# Patient Record
Sex: Male | Born: 1935 | Race: White | Hispanic: No | Marital: Married | Smoking: Former smoker
Health system: Southern US, Community
[De-identification: ages and names within clinical notes are randomized; demographics above are authoritative.]

## PROBLEM LIST (undated history)

## (undated) DIAGNOSIS — H9190 Unspecified hearing loss, unspecified ear: Secondary | ICD-10-CM

## (undated) DIAGNOSIS — J449 Chronic obstructive pulmonary disease, unspecified: Secondary | ICD-10-CM

## (undated) DIAGNOSIS — I10 Essential (primary) hypertension: Secondary | ICD-10-CM

## (undated) DIAGNOSIS — I639 Cerebral infarction, unspecified: Secondary | ICD-10-CM

## (undated) DIAGNOSIS — E119 Type 2 diabetes mellitus without complications: Secondary | ICD-10-CM

## (undated) DIAGNOSIS — I4891 Unspecified atrial fibrillation: Secondary | ICD-10-CM

## (undated) HISTORY — PX: HERNIA REPAIR: SHX51

---

## 1998-12-31 ENCOUNTER — Encounter: Payer: Self-pay | Admitting: Emergency Medicine

## 1998-12-31 ENCOUNTER — Inpatient Hospital Stay (HOSPITAL_COMMUNITY): Admission: EM | Admit: 1998-12-31 | Discharge: 1999-01-04 | Payer: Self-pay | Admitting: Emergency Medicine

## 2000-01-30 ENCOUNTER — Inpatient Hospital Stay (HOSPITAL_COMMUNITY): Admission: EM | Admit: 2000-01-30 | Discharge: 2000-02-02 | Payer: Self-pay | Admitting: Emergency Medicine

## 2000-01-30 ENCOUNTER — Encounter: Payer: Self-pay | Admitting: Emergency Medicine

## 2000-01-31 ENCOUNTER — Encounter: Payer: Self-pay | Admitting: Neurology

## 2000-02-03 ENCOUNTER — Emergency Department (HOSPITAL_COMMUNITY): Admission: EM | Admit: 2000-02-03 | Discharge: 2000-02-04 | Payer: Self-pay | Admitting: Emergency Medicine

## 2000-02-09 ENCOUNTER — Encounter: Admission: RE | Admit: 2000-02-09 | Discharge: 2000-02-28 | Payer: Self-pay | Admitting: Neurology

## 2002-05-22 ENCOUNTER — Emergency Department (HOSPITAL_COMMUNITY): Admission: EM | Admit: 2002-05-22 | Discharge: 2002-05-22 | Payer: Self-pay | Admitting: Emergency Medicine

## 2006-04-05 ENCOUNTER — Observation Stay (HOSPITAL_COMMUNITY): Admission: EM | Admit: 2006-04-05 | Discharge: 2006-04-07 | Payer: Self-pay | Admitting: Emergency Medicine

## 2007-03-06 ENCOUNTER — Observation Stay (HOSPITAL_COMMUNITY): Admission: EM | Admit: 2007-03-06 | Discharge: 2007-03-07 | Payer: Self-pay | Admitting: Emergency Medicine

## 2008-10-01 ENCOUNTER — Inpatient Hospital Stay (HOSPITAL_COMMUNITY): Admission: EM | Admit: 2008-10-01 | Discharge: 2008-10-04 | Payer: Self-pay | Admitting: Emergency Medicine

## 2010-07-03 LAB — BASIC METABOLIC PANEL
CO2: 22 mEq/L (ref 19–32)
CO2: 25 mEq/L (ref 19–32)
Calcium: 8.1 mg/dL — ABNORMAL LOW (ref 8.4–10.5)
Calcium: 8.9 mg/dL (ref 8.4–10.5)
Chloride: 108 mEq/L (ref 96–112)
Chloride: 109 mEq/L (ref 96–112)
GFR calc Af Amer: >60 mL/min (ref >60)
GFR calc Af Amer: >60 mL/min (ref >60)
GFR calc Af Amer: >60 mL/min (ref >60)
GFR calc non Af Amer: >60 mL/min (ref >60)
Glucose, Bld: 109 mg/dL — ABNORMAL HIGH (ref 70–99)
Potassium: 3.7 mEq/L (ref 3.5–5.1)
Sodium: 137 mEq/L (ref 135–145)
Sodium: 137 mEq/L (ref 135–145)
Sodium: 138 mEq/L (ref 135–145)

## 2010-07-03 LAB — CBC
Hemoglobin: 12.3 g/dL — ABNORMAL LOW (ref 13.0–17.0)
Hemoglobin: 12.6 g/dL — ABNORMAL LOW (ref 13.0–17.0)
Hemoglobin: 14.7 g/dL (ref 13.0–17.0)
MCHC: 32.9 g/dL (ref 30.0–36.0)
MCHC: 33 g/dL (ref 30.0–36.0)
MCV: 89.3 fL (ref 78.0–100.0)
MCV: 89.9 fL (ref 78.0–100.0)
RBC: 4.15 MIL/uL — ABNORMAL LOW (ref 4.22–5.81)
RBC: 4.25 MIL/uL (ref 4.22–5.81)
RBC: 4.98 MIL/uL (ref 4.22–5.81)
RDW: 14.3 % (ref 11.5–15.5)
WBC: 4.2 10*3/uL (ref 4.0–10.5)
WBC: 6.1 10*3/uL (ref 4.0–10.5)

## 2010-07-03 LAB — CULTURE, BLOOD (ROUTINE X 2)

## 2010-07-03 LAB — CLOSTRIDIUM DIFFICILE EIA

## 2010-07-03 LAB — GLUCOSE, CAPILLARY
Glucose-Capillary: 100 mg/dL — ABNORMAL HIGH (ref 70–99)
Glucose-Capillary: 117 mg/dL — ABNORMAL HIGH (ref 70–99)
Glucose-Capillary: 117 mg/dL — ABNORMAL HIGH (ref 70–99)
Glucose-Capillary: 118 mg/dL — ABNORMAL HIGH (ref 70–99)
Glucose-Capillary: 127 mg/dL — ABNORMAL HIGH (ref 70–99)
Glucose-Capillary: 129 mg/dL — ABNORMAL HIGH (ref 70–99)

## 2010-07-03 LAB — URINE MICROSCOPIC-ADD ON

## 2010-07-03 LAB — URINALYSIS, ROUTINE W REFLEX MICROSCOPIC
Leukocytes, UA: NEGATIVE
Nitrite: NEGATIVE
Specific Gravity, Urine: 1.022 (ref 1.005–1.030)
pH: 5.5 (ref 5.0–8.0)

## 2010-07-03 LAB — DIFFERENTIAL
Basophils Relative: 0 % (ref 0–1)
Lymphs Abs: 0.8 10*3/uL (ref 0.7–4.0)
Monocytes Absolute: 0.3 10*3/uL (ref 0.1–1.0)
Monocytes Relative: 3 % (ref 3–12)
Neutro Abs: 9.1 10*3/uL — ABNORMAL HIGH (ref 1.7–7.7)

## 2010-07-03 LAB — POCT CARDIAC MARKERS
CKMB, poc: <1 ng/mL — ABNORMAL LOW (ref 1.0–8.0)
Myoglobin, poc: 106 ng/mL (ref 12–200)
Troponin i, poc: <0.05 ng/mL (ref 0.00–0.09)

## 2010-07-03 LAB — STOOL CULTURE

## 2010-07-03 LAB — HEMOGLOBIN A1C: Hgb A1c MFr Bld: 6.3 % — ABNORMAL HIGH (ref 4.6–6.1)

## 2010-07-03 LAB — TSH: TSH: 1.043 u[IU]/mL (ref 0.350–4.500)
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
## 2010-08-09 NOTE — H&P (Signed)
[name], [name]              ACCOUNT NO.:  [id number]   MEDICAL RECORD NO.:  [id number]          PATIENT TYPE:  INP   LOCATION:  1532                         FACILITY:  [facility]   PHYSICIAN:  Pedro Earls, MD     DATE OF BIRTH:  1927/08/25   DATE OF ADMISSION:  09/30/2008  DATE OF DISCHARGE:                              HISTORY & PHYSICAL   PRIMARY CARE PHYSICIAN:  [facility].   CHIEF COMPLAINT:  Fever and weakness and shaking spells.   HISTORY OF PRESENT ILLNESS:  This is a 77 year old white male patient  with a past medical history of CVA leading to aphasia and memory  impairment who was apparently doing fine until this evening when the  patient was noticed by the family members that he had been having some  shaking spells for almost an hour, and subsequently the patient had an  episode of urinary incontinence which was new for him.  According to  wife, the patient had been feeling weak for the past few days but  gradually had gotten worse and today was found to have difficulty  getting out of his chair and was having some shaking spells.  The  patient was brought over to the ED where he was found to be having some  fever.   REVIEW OF SYSTEMS:  The patient also had an episode of diarrhea, stated  a couple episodes of watery diarrhea today and yesterday with some  abdominal pain.  No nausea, no vomiting, no chest pain, no shortness of  breath.  All the review of systems are negative except what is mentioned  above.   PAST HISTORY:  1. Hypertension.  2. Diabetes.  3. Glaucoma.  4. Hyperlipidemia.  5. CA prostate.  6. CVA.   PAST SURGICAL HISTORY:  Hemorrhoidectomy, back surgery and prostate  biopsy.   SOCIAL HISTORY:  Negative for smoking, alcohol or IV drug abuse.  The  patient lives with wife.   ALLERGIES:  NKDA.   FAMILY HISTORY:  Positive for diabetes in mother.   MEDICATIONS:  1. Terazosin 5 mg every night.  2. Darvocet 100 mg b.i.d.  3. Finasteride 5 mg daily.  4. Travoprost Opthalmic 1 drop every night.  5. Loperamide 2 mg p.r.n.  6. Hydrochlorothiazide 12.5 mg daily.  7. Metoprolol 25 mg daily.  8. Metformin 500 mg b.i.d.  9. Pravastatin 40 mg every night.  10.Omeprazole 20 mg b.i.d.   PHYSICAL EXAMINATION:  VITALS:  Temperature initially was 103 -  subsequently later was 100.7, respiration 16-18, pulse 83-92, blood  pressure 104-134/50s-60s, pulse oximetry of 91%.  GENERAL:  The patient is awake, alert, oriented x3.  Does not appear to  be in acute distress.  HEENT:  Pupils equal, round, reactive to light.  No icterus.  Mild  pallor.  Extraocular movements are intact.  Mucosa is dry.  NECK:  Supple.  No JVD.  No lymphadenopathy.  CARDIOVASCULAR SYSTEM:  S1-S2 regular.  No murmurs, heaves or gallops.  CHEST:  Clear.  ABDOMEN:  Soft.  There is tenderness with deep palpation in the mid  midabdomen.  No rebound.  Bowel sounds present.  No hepatosplenomegaly.  EXTREMITIES:  Peripheral pulses present.  No clubbing, cyanosis or  edema.  CENTRAL NERVOUS SYSTEM:  Sensory and motor grossly intact.  Cranial  nerves II-XII  intact.  SKIN:  No rashes.  MUSCULOSKELETAL:  Intact range of motion is present.   The patient's EKG showed normal sinus rhythm with left axis deviation  and left anterior fascicular block as well as right bundle branch block  and inverted T-waves.  No acute ST-T wave changes were seen suggestive  for any ischemia.   Chest x-ray showed some atelectasis.  CT head showed remote infarction  of left posterior temporal and parietal lobe.  No acute findings were  seen.  Troponin less than 0.05.  Creatinine 1.16.  UA is negative for  nitrite, leukocyte esterase.  H and H is 14.7 and 44.41, white count is  10.2, platelet count is 158.   IMPRESSION:  1. Abdominal pain with diarrhea.  2. Gait abnormalities.  Rule out transient ischemic attack.  3. Urinary incontinence, acute.  4. Fever.  5. History of  CVA.  6. History of hypertension.  7. Hyperlipidemia.   PLAN:  Obtain CT abdomen and pelvis to rule out for diverticulitis.  Will check stool for clostridium difficile x3.  Hold metformin for now.  Start Flagyl empirically as well as Levaquin which can be stopped after  the CT scan has been done.  IV fluids at 80 mL per hour.  Aspirin will  be started 325 mg daily.      Pedro Earls, MD  Electronically Signed     NS/MEDQ  D:  09/30/2008  T:  10/01/2008  Job:  [id number]   cc:   [facility]  [place], [place]

## 2010-08-09 NOTE — Discharge Summary (Signed)
NAMEJACARRI, [name]              ACCOUNT NO.:  [id number]   MEDICAL RECORD NO.:  [id number]          PATIENT TYPE:  INP   LOCATION:  1532                         FACILITY:  [facility]   PHYSICIAN:  Renee Ramus, MD       DATE OF BIRTH:  02-15-1928   DATE OF ADMISSION:  09/30/2008  DATE OF DISCHARGE:  10/04/2008                               DISCHARGE SUMMARY   PRIMARY DISCHARGE DIAGNOSIS:  Viral gastroenteritis.   SECONDARY DIAGNOSES:  1. Hypertension.  2. Diabetes mellitus type 2 well-controlled.  3. Glaucoma.  4. Hyperlipidemia.  5. Cancer of the prostate.  6. Stroke history.   HOSPITAL COURSE:  1. Viral gastroenteritis.  The patient is a 77 year old male who      presented with mental status changes, dehydration, mild fevers and      diarrhea.  The patient was seen in the emergency department and was      admitted to our service.  The patient was placed on broad spectrum      antibiotics.  He had an abdominal CT scan that showed pan colitis.      He did not have an elevated white count.  He did not have fevers      while in-house.  He did not have evidence of bright red blood per      rectum or guaiac positive stools.  His C. diff cultures have been      negative.  We believe he was suffering from viral gastroenteritis.      The patient is now being discharged with instructions to follow up      with his primary care physician within 2 weeks if his symptoms      persist.  2. Diabetes mellitus type 2 well-controlled.  The patient will be      continued on his metformin postdischarge.  His hemoglobin A1c is      6.1.  3. Hypertension.  The patient has been relatively well-controlled on      his current regimen.  We are, however, discontinuing his      hydrochlorothiazide since he came in dehydrated and believe that      this was a contributing factor.  4. Glaucoma.  The patient will be continued on his alpha blockers and      this is stable.  5. Hyperlipidemia.  The patient  will continue statin therapy.  6. Stroke history.  The patient will be placed on aspirin 81 mg p.o.      daily.  7. Cancer of the prostate.  The patient will continue his outpatient      medication regimen.  He has not required additional treatment for      this.   LABS:  1. No evidence of leukocytosis.  The patient did have a mild anemia      with hemoglobin of 12.3, hematocrit 37 and a mild thrombocytopenia      with platelets of 138.  2. Blood glucose relatively stable ranging between 114-125.  3. Initial elevated BUN and creatinine with BUN 19, creatinine 1.16;      this  is decreased to a BUN of 9 and creatinine of 0.97.  4. UA showing moderate amount of blood but no evidence of infection,      somewhat concentrated with specific gravity of 1.022 upon      admission.  5. Negative blood cultures x2, negative C. diff toxin assay x1.  Stool      culture is currently pending.   STUDIES:  1. EKG showing left axis deviation with right bundle branch block and      a possibility of old inferior wall infarct.  2. CT of the abdomen and pelvis showing enterocolitis spanning from      the ileum to the colon with tiny liver lesions thought to be simple      cysts and an elongated spleen.  He also has evidence of enlarged      prostate.  3. CT head showing old lacunar infarct in the right posterior temporal      and parietal lobes.  No evidence of acute findings.  4. Chest x-ray showing cardiomegaly with mild bibasilar atelectasis.   DISCHARGE MEDICATIONS:  1. Terazosin 5 mg p.o. daily.  2. Docusate sodium 100 mg p.o. b.i.d. which we are asking him to      discontinue.  3. Finasteride 5 mg p.o. daily.  4. Travoprost 0.004% one drop both eyes daily.  5. Loperamide 2 mg p.o. p.r.n. which we are asking him to discontinue.  6. Hydrochlorothiazide 12.5 mg p.o. daily which we are asking him to      discontinue.  7. Metoprolol 25 mg p.o. daily.  8. Metformin 500 mg p.o. b.i.d.  9.  Pravastatin 40 mg p.o. daily.  0.  Omeprazole 20 mg p.o. b.i.d.   There are no other labs or studies pending at time of discharge.  The  patient is in stable condition and anxious for discharge.  Time spent 35  minutes.      Renee Ramus, MD  Electronically Signed     JF/MEDQ  D:  10/04/2008  T:  10/04/2008  Job:  [id number]   cc:   [facility]

## 2010-08-09 NOTE — Consult Note (Signed)
[name], [name]              ACCOUNT NO.:  [id number]   MEDICAL RECORD NO.:  [id number]          PATIENT TYPE:  INP   LOCATION:  4735                         FACILITY:  [facility]   PHYSICIAN:  Vesta Mixer, M.D. DATE OF BIRTH:  12/05/1927   DATE OF CONSULTATION:  03/06/2007  DATE OF DISCHARGE:                                 CONSULTATION   [name] is a 77 year old gentleman with a history of hypertension and  a history of stroke.  He is admitted to the hospital for episodes of  chest pain.   The patient is a very poor historian secondary to his stroke.  He does  not recall a lot of the details of his presenting symptoms.   The patient has had episodes of chest pain intermittently.  He thinks he  may have had some cardiology workup at the [facility] in the past but  does not recall.  He thinks he may have had a stress test.  He developed  chest pain last night which was described as a fullness and a bubble-  like sensation in his chest.  He described it as a fullness.  There was  no radiation.  There was no diaphoresis or shortness of breath.  He  presented to the [facility] emergency room where the pressure was  relieved with sublingual nitroglycerin.  He denies having any episodes  of indigestion.  The pain was not associated with eating, drinking,  changes of position, taking a deep breath or exercise.   The pain has not recurred throughout the hospitalization.  The patient  still feels a little bit of some sort of discomfort there but he is no  longer having any of the pain.   CURRENT MEDICATIONS:  The patient does not remember his medications.  In  the hospital he has been put on Lopressor 25 mg p.o. b.i.d.   ALLERGIES:  No known drug allergies.   PAST MEDICAL HISTORY:  1. History of stroke.  2. Hypertension.  3. History of prostate cancer according to our old records.   SOCIAL HISTORY:  The patient quit smoking 5 years ago.  He does not  drink alcohol.   FAMILY HISTORY:  Negative except for as noted in the HPI.   EXAMINATION:  He is an elderly gentleman in no acute distress.  He is  alert and oriented x3 and his mood and affect are normal.  His  temperature is 98.6, his heart rate 61, blood pressure is 160/70.  HEENT EXAM:  Reveals 2+ carotids.  He has no bruits, no JVD, no  thyromegaly.  LUNGS:  Clear to auscultation.  HEART:  Regular rate, S1, S2.  ABDOMINAL EXAM:  Reveals good bowel sounds and is nontender.  EXTREMITIES:  He has no clubbing, cyanosis or edema.  NEUROLOGIC EXAM:  Nonfocal.   His EKG reveals normal sinus rhythm.  He has a right bundle-branch block  and no ST or T-wave changes.   His cardiac enzymes are negative x3 sets.   The patient appears to be very stable.  I doubt that this represents an  acute  coronary syndrome.  He does have some dyslipidemia.  His HDL was  found to be 20.  His triglycerides are 159.  His LDL is 103.   If he remains stable through the day then I would think he would be safe  to be discharged tomorrow.  We will perform a stress Cardiolite study as  an outpatient.  We will ambulate him in the halls today and assuming  that he stays stable, will discharge him tomorrow.   Hypertension.  His blood pressure is mildly elevated today.  It is quite  likely that he is on some additional medications that we do not have.  I  have asked his wife to get the name of his other medications.  When he  was here in January 2007 he was on terazosin 2 mg q.h.s. and  hydrochlorothiazide 25 mg a day.  We will start with those and see if  that helps his blood pressure.   All of his other medical problems remain stable.           ______________________________  Vesta Mixer, M.D.     PJN/MEDQ  D:  03/06/2007  T:  03/06/2007  Job:  [id number]   cc:   [name]  Wilson Singer, M.D.

## 2010-08-12 NOTE — H&P (Signed)
[facility]  Patient:    [name], [name]                     MRN: [id number] Adm. Date:  [id number] Attending:  Erich Montane                         History and Physical  CHIEF COMPLAINT: This is the first [facility] admission for this 77 year old right handed white married male from [place], [place] admitted from the emergency room for evaluation of confusion.  HISTORY OF PRESENT ILLNESS:  In January of 2001, this patient fell off of a ladder in [place], [place] striking his head. He had a black eye but it is not clear whether or not he had true loss of consciousness. He was admitted to a hospital in [place] for 4-5 days according to his wife and the family was told that he might have seizures afterwards, otherwise he has been carrying on his normal daily activities and was in good health this morning. 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 About 12 oclock noon time "something happened". His son was called who found him to be confused and he was brought to the [facility] Emergency Room. There were no signs of trauma, urinary incontinence or tongue biting. He remained confused throughout the afternoon and is admitted for further evaluation. The patient has no recollections of exactly what happened to him. He just says that something "went". He denies any episodes of single eye vision loss, double vision, hiccups, swelling problems, slurred speech, etc. He does not take aspirin. He has no known history of high blood pressure or diabetes mellitus, or heart disease. He quit smoking cigarettes one year ago. He does not take drugs or use alcohol.  PAST MEDICAL HISTORY:  Significant for head trauma in January of 2001, intestinal problems which were evaluated at [facility] in October of 2000 by the [facility]. Education was through the ninth grade. 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 He is married for the second times and has a total of 7 children, 6 sons and 1 daughter all of whom are in good health. He quit drinking alcohol 20 years ago, he quit smoking 1 year ago. He has had no operations. He has had no serious injuries. He is allergic to an unknown type of antibiotic. He doesnt take any medications.  FAMILY HISTORY:  His mother died in her 56s from diabetes mellitus. His father died in 93s of unknown causes. He has a brother 14, 18, one in his 12s and one in his 46s all of whom are in good health. His brother died at 52 from cancer. He had another brother die in his 79s from a motor vehicle accident. He has 2 sisters 24 and 41 living well and one sister who died.  PHYSICAL EXAMINATION:  GENERAL:  Revealed a well-developed, white male who was confused. He had both left and right confusion and some short-term memory loss but did follow commands.  VITAL SIGNS:  Blood pressure lying in the right and left arm was 150/70, heart rate was 60. He had a left supraclavicular bruit heard. He was afebrile.  MENTAL STATUS:  He was alert and oriented to person though he had not been at times during the day. He did recognize his wife. He was not oriented to place, to year or  month. He could remember 1-3 objects out of 5 minutes. He knew the number of nickels in a quarter and in a dollar but not in a dollar and twenty-five. He could name objects. He had poor repetition. He had some left and right confusion. There were no ______. His cranial nerve examination revealed visual fields to be full. His disks were flat. The pupils reacted from 4-3 bilaterally. Cornuals were present. The facial sensation was equal. There was no VII nerve palsy. Hearing was decreased with air conduction greater than bone conduction. Tongue was midline. The uvula was midline. Gags were present. Sternocleidomastoid and trapezius testing were normal. His motor examination  revealed 5/5 strength in the upper and lower extremities. The sensory examination was intact to pinprick, touch, joint position and vibration testing. Deep tendon reflexes were 2+. There were no ankle jerks. Plantar responses were down going.  HEENT:  Examination revealed he upper false teeth but no lower false teeth in place. His tympanic membranes were clear.  LUNGS:  He had rales in the right posterior lung field but otherwise his lungs were clear.  HEART:  Revealed no murmurs.  ABDOMEN:  Bowel sounds were normal.  GENITALIA:  He was circumcised.  EXTREMITIES:  There was no cyanosis, clubbing or edema.  RECTAL:  Not performed since not pertinent to the present illness.  LABORATORY DATA:  Revealed a urine which was unremarkable. His hemoglobin was 15.0, hematocrit 42.3, white blood cell count 6300, platelet count was 186,000 with 61% polys, 27% lymphs, 5% monocytes, 3% eosinophils and 1% basophils. His pH was 7.398, pCO2 35.9 and a PO2 was 64.9. A 12 lead EKG showed normal sinus rhythm, left anterior fascicular block with an abnormal EKG. PR interval was 90 milliseconds. Chest x-ray showed no acute disease. CT scan of the brain showed mild diffuse central and cortical atrophy. Sodium was 139, potassium 4.0, chloride 109, CO2 content 23, glucose 92, BUN 12, creatinine 1.0, calcium 9.4. Total protein 6.0, albumin 4.1, SGOT 19, SGPT 23, alkaline phosphatase 43, total bilirubin 0.4.  IMPRESSION: 1. Confusional state, code 298.9. 2. Rule out ______, code 434.91. 3. History of head trauma with black eye, code 851.02. 4. Suspect chronic obstructive pulmonary disease, code 496.  PLAN:  Admit the patient for further evaluation to include MRI studies. DD:  01/30/00 TD:  01/31/00 Job: [id number]

## 2010-08-12 NOTE — Procedures (Signed)
[place]  Patient:    [name]                      MRN: [id number] Proc. Date: 01/01/99 Adm. Date:  [id number] Attending:  Rich Brave                           Procedure Report  PROCEDURE PERFORMED:  Colonoscopy with biopsies.  ENDOSCOPIST:  Florencia Reasons, M.D.  INDICATIONS:  A 77 year old with diarrhea and now rectal bleeding, suprapubic abdominal pain, and leukocytosis.  FINDINGS:  Pseudomembranous pancolitis.  DESCRIPTION OF PROCEDURE:  The nature, purpose and risks of the procedure had been discussed with the patient, who provided written consent.  He was brought from is hospital room to the endoscopy unit.  The procedure was performed unprepped. Sedation was fentanyl 50 mcg and Versed 6 mg IV without arrhythmias or desaturation.  Perianal exam disclosed prolapsed hemorrhoids that were partially forming into kin tags.  Digital exam was otherwise unremarkable.  The Olympus adult video colonoscope was advanced quite easily around the colon o the area just above the cecum.  Despite the absence of a prep, there was absolutely no stool within the colonic  lumen.  This exam was striking for the presence of pseudomembranous colitis, characterized by exudate in a circular fashion coating the majority of the colonic mucosa all the way to the limit of the exam which was felt to be one or two haustrations above the cecum.  Moderate attempts were made to advance the scope  further but this could not readily be accomplished, so pullback was initiated.  No polyps, cancer, diverticular disease or vascular malformations were observed  during this exam.  Admittedly, small lesions could be missed because of the large amount of exudate but no overt abnormalities other than the pseudomembranous colitis itself were seen.  Multiple biopsies were obtained and a stool aspirate was able to be obtained, totalling about 10 cc  of liquid brown stool, to send for Clostridium difficile analysis.  Of note, there was no blood within the colonic lumen despite the patients stated history of rectal bleeding.  The patient tolerated the procedure well.  There were no apparent complications. Retroflexion was not performed in the rectum.  IMPRESSION:  Pseudomembranous colitis.  PLAN: 1. Await pathology on biopsies and await results of Clostridium difficile toxin    assay. 2. Initiate treatment with metronidazole. DD:  01/01/99 TD:  01/03/99 Job: [id number]
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
## 2010-08-12 NOTE — Consult Note (Signed)
NAMEBRENDT, [name]              ACCOUNT NO.:  [id number]   MEDICAL RECORD NO.:  [id number]          PATIENT TYPE:  OBV   LOCATION:  1442                         FAC[facility]   PHYSICIAN:  Corinna L. Lendell Caprice, MDDATE OF BIRTH:  Jun 03, 1927   DATE OF CONSULTATION:  04/06/2005  DATE OF DISCHARGE:                                 CONSULTATION   REASON FOR CONSULTATION:  Hypertension and right bundle branch block.   IMPRESSION/RECOMMENDATIONS:  1. Hypertension.  I recommend resuming metoprolol and would hold      hydrochlorothiazide for around 7 days and follow up with primary      care physician.  The systolic blood pressure is slightly low, but      mean arterial blood pressure and systolic blood pressure is within      normal limits; plus the patient has no dizziness or other symptoms      of hypotension.  2. Right bundle branch block, left anterior fascicular block.  No      further workup is needed.  3. History of prostate cancer.  4. Glaucoma.   HISTORY OF PRESENT ILLNESS:  [name] is a pleasant unassigned 78-year-  old white male who was admitted to Dr. Carlos Levering service yesterday after  having undergone hand surgery for an open right second metacarpal  phalangeal dislocation and collateral ligament avulsion, an open third  metacarpal phalangeal joint dislocation.  The patient's primary care  physician is at the [facility] in [place].  We were consulted over concerns of  new right bundle branch block on EKG and also for diastolic blood  pressure of 40.  The patient currently has no complaints other than some  pain in his hand.   PAST MEDICAL HISTORY:  As above.   MEDICATIONS AT HOME:  1. Metoprolol 50 mg p.o. b.i.d.  2. Terazosin 2 mg p.o. q.h.s.  3. Hydrochlorothiazide 25 mg daily.  4. Timolol eye drops.  5. Baby aspirin daily.  6. In house, his metoprolol and hydrochlorothiazide have been held,      and he has been started on Ancef and some p.r.n. pain  medications.   SOCIAL HISTORY:  The patient quit smoking 7 years ago.  He does not  drink.  He denies a history of drug use.   FAMILY HISTORY:  Noncontributory.   REVIEW OF SYSTEMS:  As above, otherwise negative.   PHYSICAL EXAMINATION:  VITAL SIGNS:  His temperature is 98, pulse 63,  respiratory rate 20, blood pressure 112/49, oxygen saturation 98% on 2  liters nasal cannula.  GENERAL:  The patient is comfortable, well-nourished, well-developed in  no acute distress.  HEENT:  Normocephalic, atraumatic.  Pupils equal, round, reactive to  light.  NECK:  Supple.  LUNGS:  Clear to auscultation bilaterally without wheezes, rhonchi, or  rales.  CARDIOVASCULAR:  Regular rate and rhythm without murmurs,  gallops, or rubs.  ABDOMEN:  Normal bowel sounds, soft, nontender, nondistended.  GU/RECTAL:  Deferred.  EXTREMITIES:  No clubbing, cyanosis, or edema.  His right hand is  elevated and in a splint.  NEUROLOGIC:  Alert and oriented.  Cranial  nerves and sensorimotor exam  are intact.  PSYCHIATRIC:  Normal affect.   LABS:  BMET, coagulation panel, CBC, all within normal limits.  EKG  shows normal sinus rhythm with a left anterior fascicular block, which  apparently is old according to previous dictations.  There is no old EKG  on the chart.  He also has a right bundle branch block.  X-ray of the  right hand showed dorsal dislocations of the second and third MCP joints  without visible fracture.   I would like to thank Dr. Amanda Pea for this consultation.  We will be  available as needed.      Corinna L. Lendell Caprice, MD  Electronically Signed     CLS/MEDQ  D:  04/06/2006  T:  04/07/2006  Job:  [id number]

## 2011-01-02 LAB — LIPID PANEL
Cholesterol: 155
HDL: 20 — ABNORMAL LOW
Triglycerides: 159 — ABNORMAL HIGH

## 2011-01-02 LAB — CBC
HCT: 41.9
Platelets: 174
RDW: 14.2

## 2011-01-02 LAB — DIFFERENTIAL
Basophils Absolute: 0
Eosinophils Absolute: 0.1 — ABNORMAL LOW
Eosinophils Relative: 3
Lymphocytes Relative: 14

## 2011-01-02 LAB — CARDIAC PANEL(CRET KIN+CKTOT+MB+TROPI)
CK, MB: 1.6
Total CK: 65
Total CK: 73

## 2011-01-02 LAB — POCT CARDIAC MARKERS
CKMB, poc: 1
CKMB, poc: <1 — ABNORMAL LOW
Myoglobin, poc: 65.9
Troponin i, poc: <0.05

## 2011-01-02 LAB — TSH: TSH: 4.592

## 2011-01-02 LAB — I-STAT 8, (EC8 V) (CONVERTED LAB)
Bicarbonate: 23.5
Glucose, Bld: 183 — ABNORMAL HIGH
TCO2: 25
pCO2, Ven: 45.3
pH, Ven: 7.323 — ABNORMAL HIGH

## 2011-01-02 LAB — HEMOGLOBIN A1C
Hgb A1c MFr Bld: 6.6 — ABNORMAL HIGH
Mean Plasma Glucose: 158

## 2011-01-02 LAB — D-DIMER, QUANTITATIVE: D-Dimer, Quant: 0.46

## 2011-01-02 LAB — POCT I-STAT CREATININE: Operator id: 272551

## 2012-08-06 SURGERY — Surgical Case
Anesthesia: *Unknown

## 2013-03-25 ENCOUNTER — Emergency Department (HOSPITAL_BASED_OUTPATIENT_CLINIC_OR_DEPARTMENT_OTHER): Payer: Medicare Other

## 2013-03-25 ENCOUNTER — Inpatient Hospital Stay (HOSPITAL_BASED_OUTPATIENT_CLINIC_OR_DEPARTMENT_OTHER)
Admission: EM | Admit: 2013-03-25 | Discharge: 2013-03-29 | DRG: 189 | Disposition: A | Discharge status: Home / Self Care | Payer: Medicare Other | Attending: Internal Medicine | Admitting: Internal Medicine

## 2013-03-25 ENCOUNTER — Encounter (HOSPITAL_BASED_OUTPATIENT_CLINIC_OR_DEPARTMENT_OTHER): Payer: Self-pay | Admitting: Emergency Medicine

## 2013-03-25 DIAGNOSIS — J9600 Acute respiratory failure, unspecified whether with hypoxia or hypercapnia: Principal | ICD-10-CM | POA: Diagnosis present

## 2013-03-25 DIAGNOSIS — IMO0002 Reserved for concepts with insufficient information to code with codable children: Secondary | ICD-10-CM

## 2013-03-25 DIAGNOSIS — E039 Hypothyroidism, unspecified: Secondary | ICD-10-CM

## 2013-03-25 DIAGNOSIS — E785 Hyperlipidemia, unspecified: Secondary | ICD-10-CM | POA: Diagnosis present

## 2013-03-25 DIAGNOSIS — IMO0001 Reserved for inherently not codable concepts without codable children: Secondary | ICD-10-CM | POA: Diagnosis present

## 2013-03-25 DIAGNOSIS — E1165 Type 2 diabetes mellitus with hyperglycemia: Secondary | ICD-10-CM

## 2013-03-25 DIAGNOSIS — I2510 Atherosclerotic heart disease of native coronary artery without angina pectoris: Secondary | ICD-10-CM | POA: Diagnosis present

## 2013-03-25 DIAGNOSIS — J449 Chronic obstructive pulmonary disease, unspecified: Secondary | ICD-10-CM

## 2013-03-25 DIAGNOSIS — I452 Bifascicular block: Secondary | ICD-10-CM | POA: Diagnosis present

## 2013-03-25 DIAGNOSIS — Z8673 Personal history of transient ischemic attack (TIA), and cerebral infarction without residual deficits: Secondary | ICD-10-CM

## 2013-03-25 DIAGNOSIS — I4891 Unspecified atrial fibrillation: Secondary | ICD-10-CM | POA: Diagnosis present

## 2013-03-25 DIAGNOSIS — H9190 Unspecified hearing loss, unspecified ear: Secondary | ICD-10-CM | POA: Diagnosis present

## 2013-03-25 DIAGNOSIS — Z87891 Personal history of nicotine dependence: Secondary | ICD-10-CM

## 2013-03-25 DIAGNOSIS — N400 Enlarged prostate without lower urinary tract symptoms: Secondary | ICD-10-CM

## 2013-03-25 DIAGNOSIS — Z794 Long term (current) use of insulin: Secondary | ICD-10-CM

## 2013-03-25 DIAGNOSIS — I10 Essential (primary) hypertension: Secondary | ICD-10-CM

## 2013-03-25 DIAGNOSIS — J111 Influenza due to unidentified influenza virus with other respiratory manifestations: Secondary | ICD-10-CM | POA: Diagnosis present

## 2013-03-25 DIAGNOSIS — Z79899 Other long term (current) drug therapy: Secondary | ICD-10-CM

## 2013-03-25 DIAGNOSIS — J441 Chronic obstructive pulmonary disease with (acute) exacerbation: Secondary | ICD-10-CM | POA: Diagnosis present

## 2013-03-25 DIAGNOSIS — I200 Unstable angina: Secondary | ICD-10-CM

## 2013-03-25 HISTORY — DX: Cerebral infarction, unspecified: I63.9

## 2013-03-25 HISTORY — DX: Essential (primary) hypertension: I10

## 2013-03-25 HISTORY — DX: Unspecified hearing loss, unspecified ear: H91.90

## 2013-03-25 HISTORY — DX: Type 2 diabetes mellitus without complications: E11.9

## 2013-03-25 HISTORY — DX: Chronic obstructive pulmonary disease, unspecified: J44.9

## 2013-03-25 HISTORY — DX: Unspecified atrial fibrillation: I48.91

## 2013-03-25 LAB — BASIC METABOLIC PANEL
CO2: 21 mEq/L (ref 19–32)
GFR calc non Af Amer: 60 mL/min — ABNORMAL LOW (ref >90)
Glucose, Bld: 221 mg/dL — ABNORMAL HIGH (ref 70–99)
Potassium: 4.1 mEq/L (ref 3.7–5.3)
Sodium: 137 mEq/L (ref 137–147)

## 2013-03-25 LAB — CBC
HCT: 39.7 % (ref 39.0–52.0)
Platelets: 132 10*3/uL — ABNORMAL LOW (ref 150–400)
RBC: 4.49 MIL/uL (ref 4.22–5.81)
RDW: 13.8 % (ref 11.5–15.5)
WBC: 5.7 10*3/uL (ref 4.0–10.5)

## 2013-03-25 LAB — URINALYSIS, ROUTINE W REFLEX MICROSCOPIC
Leukocytes, UA: NEGATIVE
Nitrite: NEGATIVE
Specific Gravity, Urine: 1.022 (ref 1.005–1.030)
Urobilinogen, UA: 1 mg/dL (ref 0.0–1.0)

## 2013-03-25 LAB — CREATININE, SERUM
Creatinine, Ser: 1.01 mg/dL (ref 0.50–1.35)
GFR calc Af Amer: 77 mL/min — ABNORMAL LOW (ref >90)
GFR calc non Af Amer: 67 mL/min — ABNORMAL LOW (ref >90)

## 2013-03-25 LAB — TROPONIN I: Troponin I: <0.3 ng/mL (ref <0.30)

## 2013-03-25 LAB — INFLUENZA PANEL BY PCR (TYPE A & B)
Influenza A By PCR: NEGATIVE
Influenza B By PCR: NEGATIVE

## 2013-03-25 LAB — CBC WITH DIFFERENTIAL/PLATELET
Basophils Absolute: 0 10*3/uL (ref 0.0–0.1)
Lymphocytes Relative: 16 % (ref 12–46)
Lymphs Abs: 1.1 10*3/uL (ref 0.7–4.0)
Neutrophils Relative %: 75 % (ref 43–77)
Platelets: 137 10*3/uL — ABNORMAL LOW (ref 150–400)
RBC: 4.53 MIL/uL (ref 4.22–5.81)
RDW: 13.6 % (ref 11.5–15.5)
WBC: 6.8 10*3/uL (ref 4.0–10.5)

## 2013-03-25 LAB — URINE MICROSCOPIC-ADD ON

## 2013-03-25 LAB — GLUCOSE, CAPILLARY: Glucose-Capillary: 245 mg/dL — ABNORMAL HIGH (ref 70–99)

## 2013-03-25 MED ORDER — IPRATROPIUM-ALBUTEROL 0.5-2.5 (3) MG/3ML IN SOLN
3.0000 mL | Freq: Four times a day (QID) | RESPIRATORY_TRACT | Status: DC
  Administered 2013-03-25 (×3): 3 mL via RESPIRATORY_TRACT
  Filled 2013-03-25: qty 3

## 2013-03-25 MED ORDER — DEXTROSE 5 % IV SOLN
500.0000 mg | Freq: Once | INTRAVENOUS | Status: AC
  Administered 2013-03-25: 500 mg via INTRAVENOUS

## 2013-03-25 MED ORDER — ACETAMINOPHEN 325 MG PO TABS
650.0000 mg | ORAL_TABLET | Freq: Four times a day (QID) | ORAL | Status: DC | PRN
  Administered 2013-03-25 – 2013-03-29 (×4): 650 mg via ORAL
  Filled 2013-03-25 (×4): qty 2

## 2013-03-25 MED ORDER — METHYLPREDNISOLONE SODIUM SUCC 125 MG IJ SOLR
60.0000 mg | Freq: Four times a day (QID) | INTRAMUSCULAR | Status: DC
  Administered 2013-03-25 – 2013-03-26 (×4): 60 mg via INTRAVENOUS
  Filled 2013-03-25 (×8): qty 0.96

## 2013-03-25 MED ORDER — ONDANSETRON HCL 4 MG PO TABS: 4.0000 mg | ORAL_TABLET | Freq: Four times a day (QID) | ORAL | Status: DC | PRN

## 2013-03-25 MED ORDER — MORPHINE SULFATE 2 MG/ML IJ SOLN
2.0000 mg | INTRAMUSCULAR | Status: DC | PRN
  Administered 2013-03-25 – 2013-03-27 (×5): 2 mg via INTRAVENOUS
  Filled 2013-03-25 (×5): qty 1

## 2013-03-25 MED ORDER — KETOTIFEN FUMARATE 0.025 % OP SOLN
2.0000 [drp] | Freq: Two times a day (BID) | OPHTHALMIC | Status: DC
  Administered 2013-03-25 – 2013-03-29 (×8): 2 [drp] via OPHTHALMIC
  Filled 2013-03-25: qty 5

## 2013-03-25 MED ORDER — AMLODIPINE BESYLATE 2.5 MG PO TABS
2.5000 mg | ORAL_TABLET | Freq: Every day | ORAL | Status: DC
  Administered 2013-03-25 – 2013-03-29 (×5): 2.5 mg via ORAL
  Filled 2013-03-25 (×5): qty 1

## 2013-03-25 MED ORDER — LISINOPRIL 40 MG PO TABS
40.0000 mg | ORAL_TABLET | Freq: Every day | ORAL | Status: DC
  Administered 2013-03-25 – 2013-03-29 (×5): 40 mg via ORAL
  Filled 2013-03-25 (×5): qty 1

## 2013-03-25 MED ORDER — INSULIN ASPART 100 UNIT/ML ~~LOC~~ SOLN
0.0000 [IU] | Freq: Three times a day (TID) | SUBCUTANEOUS | Status: DC
  Administered 2013-03-25: 14:00:00 4 [IU] via SUBCUTANEOUS
  Administered 2013-03-25 – 2013-03-26 (×3): 11 [IU] via SUBCUTANEOUS
  Administered 2013-03-26 – 2013-03-27 (×2): 7 [IU] via SUBCUTANEOUS
  Administered 2013-03-27: 4 [IU] via SUBCUTANEOUS
  Administered 2013-03-27: 07:00:00 7 [IU] via SUBCUTANEOUS
  Administered 2013-03-28: 19:00:00 4 [IU] via SUBCUTANEOUS
  Administered 2013-03-28 (×2): 7 [IU] via SUBCUTANEOUS
  Administered 2013-03-29 (×2): 4 [IU] via SUBCUTANEOUS

## 2013-03-25 MED ORDER — LEVOFLOXACIN IN D5W 750 MG/150ML IV SOLN
750.0000 mg | INTRAVENOUS | Status: DC
  Administered 2013-03-25: 750 mg via INTRAVENOUS
  Filled 2013-03-25 (×2): qty 150

## 2013-03-25 MED ORDER — PRAVASTATIN SODIUM 40 MG PO TABS
40.0000 mg | ORAL_TABLET | Freq: Every day | ORAL | Status: DC
  Administered 2013-03-25 – 2013-03-28 (×4): 40 mg via ORAL
  Filled 2013-03-25 (×5): qty 1

## 2013-03-25 MED ORDER — IPRATROPIUM-ALBUTEROL 0.5-2.5 (3) MG/3ML IN SOLN
3.0000 mL | RESPIRATORY_TRACT | Status: DC | PRN
  Administered 2013-03-26 – 2013-03-27 (×2): 3 mL via RESPIRATORY_TRACT
  Filled 2013-03-25 (×4): qty 3

## 2013-03-25 MED ORDER — HEPARIN SODIUM (PORCINE) 5000 UNIT/ML IJ SOLN
5000.0000 [IU] | Freq: Three times a day (TID) | INTRAMUSCULAR | Status: DC
  Administered 2013-03-25 – 2013-03-27 (×8): 5000 [IU] via SUBCUTANEOUS
  Filled 2013-03-25 (×12): qty 1

## 2013-03-25 MED ORDER — ONDANSETRON HCL 4 MG/2ML IJ SOLN: 4.0000 mg | Freq: Four times a day (QID) | INTRAMUSCULAR | Status: DC | PRN

## 2013-03-25 MED ORDER — LEVOTHYROXINE SODIUM 25 MCG PO TABS
25.0000 ug | ORAL_TABLET | Freq: Every day | ORAL | Status: DC
  Administered 2013-03-26 – 2013-03-29 (×4): 25 ug via ORAL
  Filled 2013-03-25 (×6): qty 1

## 2013-03-25 MED ORDER — ALBUTEROL SULFATE (2.5 MG/3ML) 0.083% IN NEBU
5.0000 mg | INHALATION_SOLUTION | Freq: Once | RESPIRATORY_TRACT | Status: AC
  Administered 2013-03-25: 5 mg via RESPIRATORY_TRACT

## 2013-03-25 MED ORDER — CEFTRIAXONE SODIUM 1 G IJ SOLR
INTRAMUSCULAR | Status: AC
  Filled 2013-03-25: qty 10

## 2013-03-25 MED ORDER — INSULIN ASPART 100 UNIT/ML ~~LOC~~ SOLN
0.0000 [IU] | Freq: Every day | SUBCUTANEOUS | Status: DC
  Administered 2013-03-25 – 2013-03-26 (×2): 2 [IU] via SUBCUTANEOUS

## 2013-03-25 MED ORDER — LATANOPROST 0.005 % OP SOLN
1.0000 [drp] | Freq: Every day | OPHTHALMIC | Status: DC
  Administered 2013-03-25 – 2013-03-28 (×4): 1 [drp] via OPHTHALMIC
  Filled 2013-03-25: qty 2.5

## 2013-03-25 MED ORDER — SODIUM CHLORIDE 0.9 % IV SOLN
INTRAVENOUS | Status: DC
  Administered 2013-03-25: 09:00:00 125 mL/h via INTRAVENOUS
  Administered 2013-03-26 (×2): via INTRAVENOUS
  Administered 2013-03-28: 10 mL/h via INTRAVENOUS

## 2013-03-25 MED ORDER — ACETAMINOPHEN 650 MG RE SUPP: 650.0000 mg | Freq: Four times a day (QID) | RECTAL | Status: DC | PRN

## 2013-03-25 MED ORDER — ALBUTEROL SULFATE (2.5 MG/3ML) 0.083% IN NEBU: 2.5000 mg | INHALATION_SOLUTION | RESPIRATORY_TRACT | Status: DC | PRN

## 2013-03-25 MED ORDER — IPRATROPIUM BROMIDE 0.02 % IN SOLN
0.5000 mg | Freq: Once | RESPIRATORY_TRACT | Status: AC
  Administered 2013-03-25: 0.5 mg via RESPIRATORY_TRACT

## 2013-03-25 MED ORDER — DEXTROSE 5 % IV SOLN
1.0000 g | Freq: Once | INTRAVENOUS | Status: AC
  Administered 2013-03-25: 1 g via INTRAVENOUS

## 2013-03-25 MED ORDER — IBUPROFEN 600 MG PO TABS
600.0000 mg | ORAL_TABLET | ORAL | Status: DC | PRN
  Administered 2013-03-25: 600 mg via ORAL
  Filled 2013-03-25 (×2): qty 1

## 2013-03-25 MED ORDER — FINASTERIDE 5 MG PO TABS
5.0000 mg | ORAL_TABLET | Freq: Every day | ORAL | Status: DC
  Administered 2013-03-25 – 2013-03-29 (×5): 5 mg via ORAL
  Filled 2013-03-25 (×5): qty 1

## 2013-03-25 MED ORDER — PANTOPRAZOLE SODIUM 40 MG PO TBEC
40.0000 mg | DELAYED_RELEASE_TABLET | Freq: Every day | ORAL | Status: DC
  Administered 2013-03-25 – 2013-03-29 (×5): 40 mg via ORAL
  Filled 2013-03-25 (×3): qty 1

## 2013-03-25 MED ORDER — METOPROLOL TARTRATE 25 MG PO TABS
25.0000 mg | ORAL_TABLET | Freq: Two times a day (BID) | ORAL | Status: DC
  Administered 2013-03-25 – 2013-03-27 (×6): 25 mg via ORAL
  Filled 2013-03-25 (×8): qty 1

## 2013-03-25 MED ORDER — TERAZOSIN HCL 5 MG PO CAPS
10.0000 mg | ORAL_CAPSULE | Freq: Every day | ORAL | Status: DC
  Administered 2013-03-25 – 2013-03-28 (×4): 10 mg via ORAL
  Filled 2013-03-25 (×5): qty 2

## 2013-03-25 MED ORDER — OSELTAMIVIR PHOSPHATE 30 MG PO CAPS
30.0000 mg | ORAL_CAPSULE | Freq: Two times a day (BID) | ORAL | Status: DC
  Administered 2013-03-25 – 2013-03-26 (×2): 30 mg via ORAL
  Filled 2013-03-25 (×4): qty 1

## 2013-03-25 MED ORDER — SIMVASTATIN 40 MG PO TABS: 40.0000 mg | ORAL_TABLET | Freq: Every day | ORAL | Status: DC

## 2013-03-25 NOTE — Progress Notes (Signed)
Pt c/o of CP 6/10, hurts when chest is pressed on, VS wnl.  MD notified, order given for EKG, will cycle enzymes, and give ibuprofen.  Will carry out MD orders and continue to monitor.

## 2013-03-25 NOTE — ED Notes (Signed)
Tylenol 1000mg  given by EMS enroute. EMS did not know if pt has any med allergies and Pt does not know either.

## 2013-03-25 NOTE — Progress Notes (Signed)
Utilization review completed. Jahden Schara, RN, BSN. 

## 2013-03-25 NOTE — Plan of Care (Signed)
Name: AARSH FRISTOE [facility]CP: [facility] MRN: [id number]  79 year old gentleman presents with COPD exacerbation and fever.  May have upper respiratory viral infection.  Started on antibiotics for COPD exacerbation.  Bed requested: Telemetry (frequent PVCs).  [name] Elizabeth, MD 03/25/2013, 3:39 AM

## 2013-03-25 NOTE — Progress Notes (Signed)
Inpatient Diabetes Program Recommendations  AACE/ADA: New Consensus Statement on Inpatient Glycemic Control (2013)  Target Ranges:  Prepandial:   less than 140 mg/dL      Peak postprandial:   less than 180 mg/dL (1-2 hours)      Critically ill patients:  140 - 180 mg/dL   Reason for Visit: Hyperglycemia  Results for ADAM, SANJUAN (MRN [id number]) as of 03/25/2013 12:41  Ref. Range 03/25/2013 01:01  Glucose Latest Range: 70-99 mg/dL 409 (H)  Results for SASHA, RUETH (MRN [id number]) as of 03/25/2013 12:41  Ref. Range 03/25/2013 11:48  Glucose-Capillary Latest Range: 70-99 mg/dL 956 (H)    Inpatient Diabetes Program Recommendations HgbA1C: Check HgbA1C to assess glycemic control prior to hospitalization  Note: Will continue to follow. Thank you. Ailene Ards, RD, LDN, CDE Inpatient Diabetes Coordinator [phone]

## 2013-03-25 NOTE — ED Notes (Signed)
Has been to xray and returned

## 2013-03-25 NOTE — ED Provider Notes (Signed)
CSN: [id number]     Arrival date & time 03/25/13  0019 History   First MD Initiated Contact with Patient 03/25/13 [phone]     Chief Complaint  Patient presents with  . Flu-like Symptoms    (Consider location/radiation/quality/duration/timing/severity/associated sxs/prior Treatment) HPI This is an 79 year old male with COPD. He is here with a three-day history of cough and worsening shortness of breath. He was noted to have a fever earlier by his family and was noted to be confused. His temperature on arrival here was 103.1. The cough has been severe enough to cause post tussive emesis. He is noted to drop his oxygen saturation into the 80s without supplemental oxygen; he is not on home oxygen. He had some diarrhea yesterday. He was given an albuterol and Atrovent neb treatment prior to arrival with improvement in his dyspnea. He was given 1000 mg of Tylenol by EMS as well.  Past Medical History  Diagnosis Date  . CVA (cerebral infarction)   . COPD (chronic obstructive pulmonary disease)   . Hypertension   . Diabetes mellitus without complication   . A-fib   . Hard of hearing    Past Surgical History  Procedure Laterality Date  . Hernia repair     No family history on file. History  Substance Use Topics  . Smoking status: Not on file  . Smokeless tobacco: Not on file  . Alcohol Use: Not on file    Review of Systems  All other systems reviewed and are negative.    Allergies  Review of patient's allergies indicates no known allergies.  Home Medications   Current Outpatient Rx  Name  Route  Sig  Dispense  Refill  . albuterol-ipratropium (COMBIVENT) 18-103 MCG/ACT inhaler   Inhalation   Inhale 2 puffs into the lungs every 4 (four) hours.         Marland Kitchen amLODipine (NORVASC) 2.5 MG tablet   Oral   Take 2.5 mg by mouth daily.         . finasteride (PROSCAR) 5 MG tablet   Oral   Take 5 mg by mouth daily.         Marland Kitchen ketotifen (ZADITOR) 0.025 % ophthalmic solution   Both  Eyes   Place 2 drops into both eyes 2 (two) times daily.         Marland Kitchen latanoprost (XALATAN) 0.005 % ophthalmic solution   Both Eyes   Place 1 drop into both eyes at bedtime.         Marland Kitchen levothyroxine (SYNTHROID, LEVOTHROID) 25 MCG tablet   Oral   Take 25 mcg by mouth daily before breakfast.         . lisinopril (PRINIVIL,ZESTRIL) 40 MG tablet   Oral   Take 40 mg by mouth daily.         . metFORMIN (GLUCOPHAGE) 500 MG tablet   Oral   Take by mouth 2 (two) times daily with a meal.         . metoprolol tartrate (LOPRESSOR) 25 MG tablet   Oral   Take 25 mg by mouth 2 (two) times daily.         . Multiple Vitamins-Minerals (MULTIVITAMIN WITH MINERALS) tablet   Oral   Take 1 tablet by mouth daily.         Marland Kitchen omeprazole (PRILOSEC) 20 MG capsule   Oral   Take 20 mg by mouth 2 (two) times daily before a meal.         .  pravastatin (PRAVACHOL) 40 MG tablet   Oral   Take 40 mg by mouth daily.         Marland Kitchen terazosin (HYTRIN) 10 MG capsule   Oral   Take 10 mg by mouth at bedtime.          BP 181/52  Pulse 80  Temp(Src) 100.9 F (38.3 C) (Oral)  Resp 32  Ht 5\' 8"  (1.727 m)  Wt 160 lb (72.576 kg)  BMI 24.33 kg/m2  SpO2 91%  Physical Exam General: Well-developed, well-nourished male in no acute distress; appearance consistent with age of record HENT: normocephalic; atraumatic Eyes: pupils equal, round and reactive to light; extraocular muscles intact; lens implant Neck: supple Heart: regular rate and rhythm; frequent ectopy Lungs: Distant sounds; no wheezing Abdomen: soft; nondistended; mild diffuse tenderness; no masses or hepatosplenomegaly; bowel sounds present Extremities: No deformity; full range of motion; pulses normal Neurologic: Awake, alert; motor function intact in all extremities and symmetric; no facial droop; hard of hearing Skin: Warm and dry Psychiatric: Flat affect    ED Course  Procedures (including critical care time)\    MDM    Nursing notes and vitals signs, including pulse oximetry, reviewed.  Summary of this visit's results, reviewed by myself:  Labs:  Results for orders placed during the hospital encounter of 03/25/13 (from the past 24 hour(s))  URINALYSIS, ROUTINE W REFLEX MICROSCOPIC     Status: Abnormal   Collection Time    03/25/13  1:01 AM      Result Value Range   Color, Urine YELLOW  YELLOW   APPearance CLEAR  CLEAR   Specific Gravity, Urine 1.022  1.005 - 1.030   pH 6.0  5.0 - 8.0   Glucose, UA 250 (*) NEGATIVE mg/dL   Hgb urine dipstick NEGATIVE  NEGATIVE   Bilirubin Urine NEGATIVE  NEGATIVE   Ketones, ur NEGATIVE  NEGATIVE mg/dL   Protein, ur 30 (*) NEGATIVE mg/dL   Urobilinogen, UA 1.0  0.0 - 1.0 mg/dL   Nitrite NEGATIVE  NEGATIVE   Leukocytes, UA NEGATIVE  NEGATIVE  CBC WITH DIFFERENTIAL     Status: Abnormal   Collection Time    03/25/13  1:01 AM      Result Value Range   WBC 6.8  4.0 - 10.5 K/uL   RBC 4.53  4.22 - 5.81 MIL/uL   Hemoglobin 13.2  13.0 - 17.0 g/dL   HCT 16.1  09.6 - 04.5 %   MCV 88.1  78.0 - 100.0 fL   MCH 29.1  26.0 - 34.0 pg   MCHC 33.1  30.0 - 36.0 g/dL   RDW 40.9  81.1 - 91.4 %   Platelets 137 (*) 150 - 400 K/uL   Neutrophils Relative % 75  43 - 77 %   Neutro Abs 5.1  1.7 - 7.7 K/uL   Lymphocytes Relative 16  12 - 46 %   Lymphs Abs 1.1  0.7 - 4.0 K/uL   Monocytes Relative 8  3 - 12 %   Monocytes Absolute 0.5  0.1 - 1.0 K/uL   Eosinophils Relative 2  0 - 5 %   Eosinophils Absolute 0.1  0.0 - 0.7 K/uL   Basophils Relative 0  0 - 1 %   Basophils Absolute 0.0  0.0 - 0.1 K/uL  BASIC METABOLIC PANEL     Status: Abnormal   Collection Time    03/25/13  1:01 AM      Result Value Range   Sodium 137  137 - 147 mEq/L   Potassium 4.1  3.7 - 5.3 mEq/L   Chloride 103  96 - 112 mEq/L   CO2 21  19 - 32 mEq/L   Glucose, Bld 221 (*) 70 - 99 mg/dL   BUN 18  6 - 23 mg/dL   Creatinine, Ser 1.61  0.50 - 1.35 mg/dL   Calcium 9.2  8.4 - 09.6 mg/dL   GFR calc non Af Amer  60 (*) >90 mL/min   GFR calc Af Amer 70 (*) >90 mL/min  URINE MICROSCOPIC-ADD ON     Status: Abnormal   Collection Time    03/25/13  1:01 AM      Result Value Range   WBC, UA 0-2  <3 WBC/hpf   Bacteria, UA RARE  RARE   Casts HYALINE CASTS (*) NEGATIVE   Urine-Other MUCOUS PRESENT      Imaging Studies: Dg Chest 2 View  03/25/2013   CLINICAL DATA:  Chest congestion and cough for 3 days.  EXAM: CHEST  2 VIEW  COMPARISON:  03/06/2012  FINDINGS: Normal heart size and pulmonary vascularity. Peribronchial thickening with central interstitial changes suggesting chronic bronchitis. Emphysematous changes in the lungs. No focal airspace consolidation. No blunting of costophrenic angles. No pneumothorax. Stable appearance since previous study.  IMPRESSION: No active cardiopulmonary disease.   Electronically Signed   By: Burman Nieves M.D.   On: 03/25/2013 01:42   2:42 AM Suspect influenza we'll start antibiotics as a precaution given his chronic lung disease.       Hanley Seamen, MD 03/25/13 [phone]

## 2013-03-25 NOTE — Progress Notes (Signed)
Patient admitted to [facility] via [facility].  A&Ox4.  Patient denies pain. MD notified of patients arrival. RN will continue to monitor. Louretta Parma, RN

## 2013-03-25 NOTE — Care Management Note (Addendum)
    Page 1 of 2   03/28/2013     3:36:56 PM   CARE MANAGEMENT NOTE 03/28/2013  Patient:  [name], [name]   Account Number:  [id number]  Date Initiated:  03/25/2013  Documentation initiated by:  HUTCHINSON,CRYSTAL  Subjective/Objective Assessment:   Admitted with flu like s/s, CHF, COPD and fever associated with hypoxic respiratory failure and cough.     Action/Plan:   CM will monitor for disposition needs   Anticipated DC Date:  03/28/2013   Anticipated DC Plan:           Choice offered to / List presented to:             Status of service:  In process, will continue to follow Medicare Important Message given?   (If response is "NO", the following Medicare IM given date fields will be blank) Date Medicare IM given:   Date Additional Medicare IM given:    Discharge Disposition:    Per UR Regulation:  Reviewed for med. necessity/level of care/duration of stay  If discussed at Long Length of Stay Meetings, dates discussed:    Comments:  03/28/2013 Left Heart Cath, Selective Coronary Angiography, LV angiography 03/28/2013 Medicare and VA coverage only. CM met with patient, wife and family. Patient receives care at [facility], [place], [place],  [facility]. No HHS prior to this admission No local PCP however, patient states he received letter from [facility] indicating approval for local PCP. Medications obtained through [facility]  via mail [place] on Oxygen but none prior to admission Transportation to appts:  Wife and children PRN. Last UR 03/25/2013 CM notified VA of IP admission CM has provided contact # for assistance with locating a PCP. Disposition:  pending: Home O2 needs:  pending Crystal Hutchinson RN, BSN, MSHL, CCM 03/28/2012   03/25/2013 From Home On IV ABX Disposition: pending. Crystal Hutchinson RN, BSN, MSHL, CCM 03/25/2013 (Unit [phone])

## 2013-03-25 NOTE — ED Notes (Signed)
Chest congestion and cough x3 days.  Worse today.

## 2013-03-25 NOTE — H&P (Signed)
[facility] History and Physical  [name] WJX:[id number] DOB: Aug 18, 1927 DOA: 03/25/2013  Referring physician: Emergency department PCP: Pcp Not In System  Specialists:   Chief Complaint: cough, fever  HPI: [name] is a 79 y.o. male  With a hx of copd, dm, and htn who presents to the ED with complaints of flu like symptoms and fevers. In the ED, the patient was noted to be hypoxic, requiring supplemental O2. Given the above, the hospitalist service was asked to admit the patient for further work-up.  Review of Systems: Per above, the remainder of the 10pt ros reviewed and are neg  Past Medical History  Diagnosis Date  . CVA (cerebral infarction)   . COPD (chronic obstructive pulmonary disease)   . Hypertension   . Diabetes mellitus without complication   . A-fib   . Hard of hearing    Past Surgical History  Procedure Laterality Date  . Hernia repair     Social History:  has no tobacco, alcohol, and drug history on file.  where does patient live--home, ALF, SNF? and with whom if at home?  Can patient participate in ADLs?  No Known Allergies  No family history on file.  (be sure to complete)  Prior to Admission medications   Medication Sig Start Date End Date Taking? Authorizing Provider  albuterol-ipratropium (COMBIVENT) 18-103 MCG/ACT inhaler Inhale 2 puffs into the lungs every 4 (four) hours.   Yes Historical Provider, MD  amLODipine (NORVASC) 2.5 MG tablet Take 2.5 mg by mouth daily.   Yes Historical Provider, MD  finasteride (PROSCAR) 5 MG tablet Take 5 mg by mouth daily.   Yes Historical Provider, MD  ketotifen (ZADITOR) 0.025 % ophthalmic solution Place 2 drops into both eyes 2 (two) times daily.   Yes Historical Provider, MD  latanoprost (XALATAN) 0.005 % ophthalmic solution Place 1 drop into both eyes at bedtime.   Yes Historical Provider, MD  levothyroxine (SYNTHROID, LEVOTHROID) 25 MCG tablet Take 25 mcg by mouth daily before breakfast.    Yes Historical Provider, MD  lisinopril (PRINIVIL,ZESTRIL) 40 MG tablet Take 40 mg by mouth daily.   Yes Historical Provider, MD  metFORMIN (GLUCOPHAGE) 500 MG tablet Take by mouth 2 (two) times daily with a meal.   Yes Historical Provider, MD  metoprolol tartrate (LOPRESSOR) 25 MG tablet Take 25 mg by mouth 2 (two) times daily.   Yes Historical Provider, MD  Multiple Vitamins-Minerals (MULTIVITAMIN WITH MINERALS) tablet Take 1 tablet by mouth daily.   Yes Historical Provider, MD  omeprazole (PRILOSEC) 20 MG capsule Take 20 mg by mouth 2 (two) times daily before a meal.   Yes Historical Provider, MD  pravastatin (PRAVACHOL) 40 MG tablet Take 40 mg by mouth daily.   Yes Historical Provider, MD  terazosin (HYTRIN) 10 MG capsule Take 10 mg by mouth at bedtime.   Yes Historical Provider, MD   Physical Exam: Filed Vitals:   03/25/13 0133 03/25/13 0256 03/25/13 0349 03/25/13 0449  BP:  121/47 135/42 120/45  Pulse:  78 84 69  Temp: 100.9 F (38.3 C)  98.1 F (36.7 C) 98.2 F (36.8 C)  TempSrc: Oral  Oral Oral  Resp:  24 20 18   Height:    5\' 8"  (1.727 m)  Weight:    77.2 kg (170 lb 3.1 oz)  SpO2:  96% 98% 95%     General:  Awake, in nad  Eyes: PERRL B  ENT: membranes moist, dentition fair  Neck: trachea midline, neck  supple  Cardiovascular: regular, s1, s2  Respiratory: normal resp effort, no wheezing  Abdomen: soft, nondistended  Skin: normal skin turgor, no abnormal skin lesions seen  Musculoskeletal: perfused, no clubbing  Psychiatric: mood/affect normal // no auditory/visual hallucinations  Neurologic: cn2-12 grossly intact, strength/sensation intact  Labs on Admission:  Basic Metabolic Panel:  Recent Labs Lab 03/25/13 0101  NA 137  K 4.1  CL 103  CO2 21  GLUCOSE 221*  BUN 18  CREATININE 1.10  CALCIUM 9.2   Liver Function Tests: No results found for this basename: AST, ALT, ALKPHOS, BILITOT, PROT, ALBUMIN,  in the last 168 hours No results found for this  basename: LIPASE, AMYLASE,  in the last 168 hours No results found for this basename: AMMONIA,  in the last 168 hours CBC:  Recent Labs Lab 03/25/13 0101  WBC 6.8  NEUTROABS 5.1  HGB 13.2  HCT 39.9  MCV 88.1  PLT 137*   Cardiac Enzymes: No results found for this basename: CKTOTAL, CKMB, CKMBINDEX, TROPONINI,  in the last 168 hours  BNP (last 3 results) No results found for this basename: PROBNP,  in the last 8760 hours CBG: No results found for this basename: GLUCAP,  in the last 168 hours  Radiological Exams on Admission: Dg Chest 2 View  03/25/2013   CLINICAL DATA:  Chest congestion and cough for 3 days.  EXAM: CHEST  2 VIEW  COMPARISON:  03/06/2012  FINDINGS: Normal heart size and pulmonary vascularity. Peribronchial thickening with central interstitial changes suggesting chronic bronchitis. Emphysematous changes in the lungs. No focal airspace consolidation. No blunting of costophrenic angles. No pneumothorax. Stable appearance since previous study.  IMPRESSION: No active cardiopulmonary disease.   Electronically Signed   By: Burman Nieves M.D.   On: 03/25/2013 01:42    Assessment/Plan Active Problems:   Influenza   COPD exacerbation   Type II or unspecified type diabetes mellitus without mention of complication, uncontrolled   Unspecified hypothyroidism   BPH (benign prostatic hyperplasia)   HTN (hypertension)   1. COPD exacerbation 1. Cont on scheduled nebs with Q2 PRN 2. Start on scheduled steroids, wean as tolerated 3. Admit to med/tele 2. Suspected flu 1. Start empiric tamiflu 2. Consider empiric levaquin as well 3. Tylenol for fevers 3. DM 1. SSI while in hospital 4. Hypothyroid 1. Cont home med 5. HTN 1. Cont meds 6. DVT prophylaxis 1. Heparin subQ 7. PVC's 1. PVC's noted overnight 2. Will cont on tele and monitor/correct electrolytes 8. BPH 1. Cont home meds  Code Status: Full (must indicate code status--if unknown or must be presumed,  indicate so) Family Communication: Pt in room (indicate person spoken with, if applicable, with phone number if by telephone) Disposition Plan: Pending (indicate anticipated LOS)  Time spent:  CHIU, STEPHEN K [facility] Pager [phone]  If 7PM-7AM, please contact night-coverage [email or web address] Password [facility] 03/25/2013, 8:19 AM

## 2013-03-26 DIAGNOSIS — J441 Chronic obstructive pulmonary disease with (acute) exacerbation: Secondary | ICD-10-CM

## 2013-03-26 DIAGNOSIS — E039 Hypothyroidism, unspecified: Secondary | ICD-10-CM

## 2013-03-26 LAB — GLUCOSE, CAPILLARY
Glucose-Capillary: 241 mg/dL — ABNORMAL HIGH (ref 70–99)
Glucose-Capillary: 285 mg/dL — ABNORMAL HIGH (ref 70–99)
Glucose-Capillary: 242 mg/dL — ABNORMAL HIGH (ref 70–99)

## 2013-03-26 LAB — COMPREHENSIVE METABOLIC PANEL
ALT: 11 U/L (ref 0–53)
Alkaline Phosphatase: 31 U/L — ABNORMAL LOW (ref 39–117)
BUN: 18 mg/dL (ref 6–23)
CO2: 21 mEq/L (ref 19–32)
Chloride: 108 mEq/L (ref 96–112)
GFR calc Af Amer: 87 mL/min — ABNORMAL LOW (ref >90)
Glucose, Bld: 244 mg/dL — ABNORMAL HIGH (ref 70–99)
Potassium: 5.3 mEq/L (ref 3.7–5.3)
Sodium: 139 mEq/L (ref 137–147)
Total Bilirubin: <0.2 mg/dL — ABNORMAL LOW (ref 0.3–1.2)
Total Protein: 5.8 g/dL — ABNORMAL LOW (ref 6.0–8.3)

## 2013-03-26 LAB — CBC
HCT: 37.4 % — ABNORMAL LOW (ref 39.0–52.0)
Hemoglobin: 12.4 g/dL — ABNORMAL LOW (ref 13.0–17.0)
MCHC: 33.2 g/dL (ref 30.0–36.0)
Platelets: 141 10*3/uL — ABNORMAL LOW (ref 150–400)
RDW: 13.9 % (ref 11.5–15.5)
WBC: 6.5 10*3/uL (ref 4.0–10.5)

## 2013-03-26 LAB — HEMOGLOBIN A1C
Hgb A1c MFr Bld: 8 % — ABNORMAL HIGH (ref <5.7)
Mean Plasma Glucose: 183 mg/dL — ABNORMAL HIGH (ref <117)

## 2013-03-26 LAB — MAGNESIUM: Magnesium: 1.8 mg/dL (ref 1.5–2.5)

## 2013-03-26 MED ORDER — ALBUTEROL SULFATE (2.5 MG/3ML) 0.083% IN NEBU
2.5000 mg | INHALATION_SOLUTION | RESPIRATORY_TRACT | Status: DC
  Administered 2013-03-26 – 2013-03-27 (×6): 2.5 mg via RESPIRATORY_TRACT
  Filled 2013-03-26 (×13): qty 3

## 2013-03-26 MED ORDER — ALPRAZOLAM 0.25 MG PO TABS
0.2500 mg | ORAL_TABLET | Freq: Three times a day (TID) | ORAL | Status: DC | PRN
  Administered 2013-03-26 – 2013-03-27 (×3): 0.25 mg via ORAL
  Filled 2013-03-26 (×3): qty 1

## 2013-03-26 MED ORDER — METHYLPREDNISOLONE SODIUM SUCC 125 MG IJ SOLR
60.0000 mg | Freq: Two times a day (BID) | INTRAMUSCULAR | Status: DC
  Administered 2013-03-26 – 2013-03-29 (×6): 60 mg via INTRAVENOUS
  Filled 2013-03-26 (×8): qty 0.96

## 2013-03-26 NOTE — Progress Notes (Signed)
[facility]                                                                                Patient Demographics  [name], is a 79 y.o. male, DOB - Apr 21, 1927, ZOX:[id number]  Admit date - 03/25/2013   Admitting Physician Cristal Ford, MD  Outpatient Primary MD for the patient is Pcp Not In System  LOS - 1   Chief Complaint  Patient presents with  . Flu-like Symptoms         Assessment & Plan   Active Problems:   Influenza   COPD exacerbation   Type II or unspecified type diabetes mellitus without mention of complication, uncontrolled   Unspecified hypothyroidism   BPH (benign prostatic hyperplasia)   HTN (hypertension)  COPD exacerbation secondary to possible upper respiratory infection -Influenza screen was negative -Chest x-ray was negative for active cardiopulmonary disease -Continue nebulizer treatments, steroids, as well as Levaquin.  Diabetes mellitus type 2 -Continue insulin sliding scale with CBG monitoring -Pending hemoglobin A1c  Hypothyroidism - Continue Synthroid  Hypertension -Currently stable, will continue amlodipine, lisinopril, metoprolol  BPH -Continue Proscar, terazosin  PVCs -Continue telemetry monitoring, continued monitor electrolytes as well. -Patient currently asymptomatic -Troponin negative  Hyperlipidemia -Continue statin   Code Status: Full  Family Communication: None at bedside.  Disposition Plan: Admitted.   Procedures None  Consults  None  DVT Prophylaxis  Heparin  Lab Results  Component Value Date   PLT 141* 03/26/2013    Medications  Scheduled Meds: . amLODipine  2.5 mg Oral Daily  . finasteride  5 mg Oral Daily  . heparin  5,000 Units Subcutaneous Q8H  . insulin aspart  0-20 Units Subcutaneous TID WC  . insulin aspart  0-5 Units Subcutaneous QHS  . ketotifen  2 drop Both Eyes BID  . latanoprost  1 drop Both Eyes QHS  . levofloxacin (LEVAQUIN) IV  750 mg Intravenous Q48H  . levothyroxine   25 mcg Oral QAC breakfast  . lisinopril  40 mg Oral Daily  . methylPREDNISolone (SOLU-MEDROL) injection  60 mg Intravenous Q6H  . metoprolol tartrate  25 mg Oral BID  . oseltamivir  30 mg Oral BID  . pantoprazole  40 mg Oral Daily  . pravastatin  40 mg Oral q1800  . terazosin  10 mg Oral QHS   Continuous Infusions: . sodium chloride 125 mL/hr (03/25/13 0902)   PRN Meds:.acetaminophen, acetaminophen, albuterol, ibuprofen, ipratropium-albuterol, morphine injection, ondansetron (ZOFRAN) IV, ondansetron  Antibiotics    Anti-infectives   Start     Dose/Rate Route Frequency Ordered Stop   03/25/13 1000  oseltamivir (TAMIFLU) capsule 30 mg    Comments:  Tamiflu 30 mg BID for CrCL < 60 mL/min   30 mg Oral 2 times daily 03/25/13 0818 03/30/13 0959   03/25/13 1000  levofloxacin (LEVAQUIN) IVPB 750 mg     750 mg 100 mL/hr over 90 Minutes Intravenous Every 48 hours 03/25/13 0818     03/25/13 0348  cefTRIAXone (ROCEPHIN) 1 G injection    Comments:  Gertie Baron   : cabinet override      03/25/13 0348 03/25/13 1559   03/25/13 0245  cefTRIAXone (ROCEPHIN)  1 g in dextrose 5 % 50 mL IVPB     1 g 100 mL/hr over 30 Minutes Intravenous  Once 03/25/13 0230 03/25/13 0426   03/25/13 0245  azithromycin (ZITHROMAX) 500 mg in dextrose 5 % 250 mL IVPB     500 mg 250 mL/hr over 60 Minutes Intravenous  Once 03/25/13 0230 03/25/13 0404       Time Spent in minutes   30 minutes   Marquay Kruse D.O. on 03/26/2013 at 11:20 AM  Between 7am to 7pm - Pager - [phone]  After 7pm go to [email or web address] - password [email or web address]  And look for the night coverage person covering for me after hours  [facility] Office  [phone]    Subjective:   [name] seen and examined today.  Patient continues to have shortness of breath. He does state that he does feel better as compared to previous days. Patient has been coughing some.   Objective:   Filed Vitals:   03/25/13 2010 03/25/13 2129  03/26/13 0642 03/26/13 0900  BP: 171/51 123/43 118/35 118/40  Pulse: 95 84 63 85  Temp: 97.9 F (36.6 C) 97.9 F (36.6 C) 97.4 F (36.3 C) 97.6 F (36.4 C)  TempSrc: Oral Oral Oral Oral  Resp: 19 18 19 20   Height:      Weight:   80.7 kg (177 lb 14.6 oz)   SpO2: 97% 96% 98% 98%    Wt Readings from Last 3 Encounters:  03/26/13 80.7 kg (177 lb 14.6 oz)     Intake/Output Summary (Last 24 hours) at 03/26/13 1120 Last data filed at 03/26/13 0900  Gross per 24 hour  Intake 3617.58 ml  Output   1700 ml  Net 1917.58 ml    Exam  General: Well developed, well nourished, NAD, appears stated age  HEENT: NCAT, PERRLA, EOMI, Anicteic Sclera, mucous membranes moist. No pharyngeal erythema or exudates  Neck: Supple, no JVD, no masses  Cardiovascular: S1 S2 auscultated, no rubs, murmurs or gallops. Regular rate and rhythm.  Respiratory: Coarse breath sounds with diminished air movement  Abdomen: Soft, nontender, nondistended, + bowel sounds  Extremities: warm dry without cyanosis clubbing   Neuro: AAOx3, cranial nerves grossly intact. Strength 5/5 in patient's upper and lower extremities bilaterally  Skin: Without rashes exudates or nodules  Psych: Normal affect and demeanor with intact judgement and insight, somewhat anxious   Data Review   Micro Results No results found for this or any previous visit (from the past 240 hour(s)).  Radiology Reports Dg Chest 2 View  03/25/2013   CLINICAL DATA:  Chest congestion and cough for 3 days.  EXAM: CHEST  2 VIEW  COMPARISON:  03/06/2012  FINDINGS: Normal heart size and pulmonary vascularity. Peribronchial thickening with central interstitial changes suggesting chronic bronchitis. Emphysematous changes in the lungs. No focal airspace consolidation. No blunting of costophrenic angles. No pneumothorax. Stable appearance since previous study.  IMPRESSION: No active cardiopulmonary disease.   Electronically Signed   By: Burman Nieves  M.D.   On: 03/25/2013 01:42    CBC  Recent Labs Lab 03/25/13 0101 03/25/13 0905 03/26/13 0518  WBC 6.8 5.7 6.5  HGB 13.2 13.1 12.4*  HCT 39.9 39.7 37.4*  PLT 137* 132* 141*  MCV 88.1 88.4 89.0  MCH 29.1 29.2 29.5  MCHC 33.1 33.0 33.2  RDW 13.6 13.8 13.9  LYMPHSABS 1.1  --   --   MONOABS 0.5  --   --   EOSABS 0.1  --   --  BASOSABS 0.0  --   --     Chemistries   Recent Labs Lab 03/25/13 0101 03/25/13 0905 03/26/13 0518  NA 137  --  139  K 4.1  --  5.3  CL 103  --  108  CO2 21  --  21  GLUCOSE 221*  --  244*  BUN 18  --  18  CREATININE 1.10 1.01 0.95  CALCIUM 9.2  --  8.3*  AST  --   --  13  ALT  --   --  11  ALKPHOS  --   --  31*  BILITOT  --   --  <0.2*   ------------------------------------------------------------------------------------------------------------------ estimated creatinine clearance is 57 ml/min (by C-G formula based on Cr of 0.95). ------------------------------------------------------------------------------------------------------------------ No results found for this basename: HGBA1C,  in the last 72 hours ------------------------------------------------------------------------------------------------------------------ No results found for this basename: CHOL, HDL, LDLCALC, TRIG, CHOLHDL, LDLDIRECT,  in the last 72 hours ------------------------------------------------------------------------------------------------------------------ No results found for this basename: TSH, T4TOTAL, FREET3, T3FREE, THYROIDAB,  in the last 72 hours ------------------------------------------------------------------------------------------------------------------ No results found for this basename: VITAMINB12, FOLATE, FERRITIN, TIBC, IRON, RETICCTPCT,  in the last 72 hours  Coagulation profile No results found for this basename: INR, PROTIME,  in the last 168 hours  No results found for this basename: DDIMER,  in the last 72 hours  Cardiac  Enzymes  Recent Labs Lab 03/25/13 1841 03/25/13 2256 03/26/13 0518  TROPONINI <0.30 <0.30 <0.30   ------------------------------------------------------------------------------------------------------------------ No components found with this basename: POCBNP,

## 2013-03-26 NOTE — Progress Notes (Signed)
Pt c/o of sharp pain on his chest, Morphine IV give, VSS, troponin negative, no distress noticed. MD notified.

## 2013-03-26 NOTE — Progress Notes (Signed)
Inpatient Diabetes Program Recommendations  AACE/ADA: New Consensus Statement on Inpatient Glycemic Control (2013)  Target Ranges:  Prepandial:   less than 140 mg/dL      Peak postprandial:   less than 180 mg/dL (1-2 hours)      Critically ill patients:  140 - 180 mg/dL   Reason for Visit: Hyperglycemia  Results for ANOOP, HEMMER (MRN [id number]) as of 03/26/2013 13:27  Ref. Range 03/25/2013 16:27 03/25/2013 20:56 03/26/2013 06:06 03/26/2013 11:14  Glucose-Capillary Latest Range: 70-99 mg/dL 782 (H) 956 (H) 213 (H) 267 (H)   Results for ANIKIN, PROSSER (MRN [id number]) as of 03/26/2013 13:27  Ref. Range 03/26/2013 05:18  Sodium Latest Range: 137-147 mEq/L 139  Potassium Latest Range: 3.7-5.3 mEq/L 5.3  Chloride Latest Range: 96-112 mEq/L 108  CO2 Latest Range: 19-32 mEq/L 21  BUN Latest Range: 6-23 mg/dL 18  Creatinine Latest Range: 0.50-1.35 mg/dL 6.29  Calcium Latest Range: 8.4-10.5 mg/dL 8.3 (L)  GFR calc non Af Amer Latest Range: >90 mL/min 75 (L)  GFR calc Af Amer Latest Range: >90 mL/min 87 (L)  Glucose Latest Range: 70-99 mg/dL 528 (H)   Blood sugars elevated with steroids, which are being tapered.  Recommendation: Add Novolog 3 units tidwc for meal coverage insulin if pt eats >50% meals.  Will continue to follow. Thank you. Ailene Ards, RD, LDN, CDE Inpatient Diabetes Coordinator [phone]    Note:

## 2013-03-27 ENCOUNTER — Encounter (HOSPITAL_COMMUNITY): Payer: Self-pay | Admitting: Cardiology

## 2013-03-27 DIAGNOSIS — I200 Unstable angina: Secondary | ICD-10-CM

## 2013-03-27 LAB — GLUCOSE, CAPILLARY
GLUCOSE-CAPILLARY: 195 mg/dL — AB (ref 70–99)
Glucose-Capillary: 214 mg/dL — ABNORMAL HIGH (ref 70–99)
Glucose-Capillary: 230 mg/dL — ABNORMAL HIGH (ref 70–99)

## 2013-03-27 LAB — PROTIME-INR
INR: 1.12 (ref 0.00–1.49)
PROTHROMBIN TIME: 14.2 s (ref 11.6–15.2)

## 2013-03-27 LAB — TROPONIN I

## 2013-03-27 MED ORDER — SODIUM CHLORIDE 0.9 % IJ SOLN: 3.0000 mL | Freq: Two times a day (BID) | INTRAMUSCULAR | Status: DC

## 2013-03-27 MED ORDER — INSULIN ASPART 100 UNIT/ML ~~LOC~~ SOLN
3.0000 [IU] | Freq: Three times a day (TID) | SUBCUTANEOUS | Status: DC
  Administered 2013-03-27 – 2013-03-29 (×6): 3 [IU] via SUBCUTANEOUS

## 2013-03-27 MED ORDER — SODIUM CHLORIDE 0.9 % IV SOLN: INTRAVENOUS | Status: DC

## 2013-03-27 MED ORDER — NITROGLYCERIN 2 % TD OINT
1.0000 [in_us] | TOPICAL_OINTMENT | Freq: Four times a day (QID) | TRANSDERMAL | Status: DC
  Administered 2013-03-27 – 2013-03-28 (×4): 1 [in_us] via TOPICAL
  Filled 2013-03-27: qty 30

## 2013-03-27 MED ORDER — LEVALBUTEROL HCL 0.63 MG/3ML IN NEBU
0.6300 mg | INHALATION_SOLUTION | RESPIRATORY_TRACT | Status: DC
  Administered 2013-03-27 – 2013-03-29 (×11): 0.63 mg via RESPIRATORY_TRACT
  Filled 2013-03-27 (×23): qty 3

## 2013-03-27 MED ORDER — NITROGLYCERIN 0.4 MG SL SUBL
SUBLINGUAL_TABLET | SUBLINGUAL | Status: AC
  Administered 2013-03-27: 09:00:00 via SUBLINGUAL
  Filled 2013-03-27: qty 25

## 2013-03-27 MED ORDER — SODIUM CHLORIDE 0.9 % IV SOLN: 250.0000 mL | INTRAVENOUS | Status: DC | PRN

## 2013-03-27 MED ORDER — LEVOFLOXACIN 750 MG PO TABS
750.0000 mg | ORAL_TABLET | Freq: Every day | ORAL | Status: DC
  Administered 2013-03-27 – 2013-03-29 (×3): 750 mg via ORAL
  Filled 2013-03-27 (×3): qty 1

## 2013-03-27 MED ORDER — ASPIRIN EC 81 MG PO TBEC
81.0000 mg | DELAYED_RELEASE_TABLET | Freq: Every day | ORAL | Status: DC
  Administered 2013-03-27 – 2013-03-29 (×2): 81 mg via ORAL
  Filled 2013-03-27 (×3): qty 1

## 2013-03-27 MED ORDER — SODIUM CHLORIDE 0.9 % IJ SOLN: 3.0000 mL | INTRAMUSCULAR | Status: DC | PRN

## 2013-03-27 NOTE — Consult Note (Signed)
[facility] Consulting cardiologist: Dr. Jonelle SidleSamuel G. Vear Martinez  Clinical Summary [name] is an 80 y.o.male currently admitted with flu-like symptoms and fever associated with hypoxic respiratory failure and cough. Influenza screen is negative. He has no infiltrates by chest x-ray. Currently being treated for COPD exacerbation, on breathing treatments, steroids, and antibiotics.  During hospitalization he has had recurring episodes of chest tightness, states this is usually worse after his breathing treatments when his heart rate goes up. He also tells me that he has been experiencing similar symptoms at home with exertion and takes nitroglycerin for this.  Cardiac history is vague. [facility], states that he has seen a cardiologist in the last few years and had what sounds like a stress test a few years ago, may have had a heart attack years back.  During this hospitalization cardiac markers have been negative, and ECG is abnormal with right bundle branch block and left anterior fascicular block.  Episode of chest pain this morning resolved after two sublingual nitroglycerin, but recurred later.   No Known Allergies  Medications Scheduled Medications: . albuterol  2.5 mg Nebulization Q4H  . amLODipine  2.5 mg Oral Daily  . finasteride  5 mg Oral Daily  . heparin  5,000 Units Subcutaneous Q8H  . insulin aspart  0-20 Units Subcutaneous TID WC  . insulin aspart  0-5 Units Subcutaneous QHS  . insulin aspart  3 Units Subcutaneous TID WC  . ketotifen  2 drop Both Eyes BID  . latanoprost  1 drop Both Eyes QHS  . levofloxacin  750 mg Oral Daily  . levothyroxine  25 mcg Oral QAC breakfast  . lisinopril  40 mg Oral Daily  . methylPREDNISolone (SOLU-MEDROL) injection  60 mg Intravenous Q12H  . metoprolol tartrate  25 mg Oral BID  . pantoprazole  40 mg Oral Daily  . pravastatin  40 mg Oral q1800  . terazosin  10 mg Oral QHS     Infusions: . sodium chloride 125 mL/hr at 03/26/13 2209    PRN Medications: acetaminophen, acetaminophen, ALPRAZolam, ibuprofen, ipratropium-albuterol, morphine injection, ondansetron (ZOFRAN) IV, ondansetron   Past Medical History  Diagnosis Date  . CVA (cerebral infarction)   . COPD (chronic obstructive pulmonary disease)   . Essential hypertension, benign   . Diabetes mellitus without complication   . Atrial fibrillation   . Hard of hearing     Past Surgical History  Procedure Laterality Date  . Hernia repair      History reviewed. No pertinent family history.  Social History [name] reports that he has quit smoking. His smoking use included Cigarettes. He smoked 0.00 packs per day. He has quit using smokeless tobacco. [name] reports that he does not drink alcohol.  Review of Systems No palpitations or syncope. No reported bleeding problems. States he has trouble with his memory after previous stroke. Otherwise as outlined above.  Physical Examination Blood pressure 165/68, pulse 100, temperature 97.5 F (36.4 C), temperature source Oral, resp. rate 20, height 5\' 8"  (1.727 m), weight 180 lb 4.8 oz (81.784 kg), SpO2 92.00%.  Intake/Output Summary (Last 24 hours) at 03/27/13 1120 Last data filed at 03/27/13 0900  Gross per 24 hour  Intake   2205 ml  Output   1200 ml  Net   1005 ml    No distress but complains of recurrent chest pain. HEENT: Conjunctiva and lids normal, oropharynx clear. Neck: Supple, no elevated JVP or  carotid bruits, no thyromegaly. Lungs: Decreased breath sounds throughout with end expiratory wheezes, nonlabored breathing at rest. Cardiac: Regular rate and rhythm, no S3, 2/6 systolic murmur, no pericardial rub. Abdomen: Soft, nontender, bowel sounds present, no guarding or rebound. Extremities: No pitting edema, distal pulses 1-2+. Skin: Warm and dry. Musculoskeletal: No kyphosis. Neuropsychiatric: Alert and oriented x3, affect  grossly appropriate.   Lab Results  Basic Metabolic Panel:  Recent Labs Lab 03/25/13 0101 03/25/13 0905 03/26/13 0518 03/26/13 1216  NA 137  --  139  --   K 4.1  --  5.3  --   CL 103  --  108  --   CO2 21  --  21  --   GLUCOSE 221*  --  244*  --   BUN 18  --  18  --   CREATININE 1.10 1.01 0.95  --   CALCIUM 9.2  --  8.3*  --   MG  --   --   --  1.8  PHOS  --   --   --  1.8*    Liver Function Tests:  Recent Labs Lab 03/26/13 0518  AST 13  ALT 11  ALKPHOS 31*  BILITOT <0.2*  PROT 5.8*  ALBUMIN 3.0*    CBC:  Recent Labs Lab 03/25/13 0101 03/25/13 0905 03/26/13 0518  WBC 6.8 5.7 6.5  NEUTROABS 5.1  --   --   HGB 13.2 13.1 12.4*  HCT 39.9 39.7 37.4*  MCV 88.1 88.4 89.0  PLT 137* 132* 141*    Cardiac Enzymes:  Recent Labs Lab 03/25/13 1841 03/25/13 2256 03/26/13 0518 03/27/13 1010  TROPONINI <0.30 <0.30 <0.30 <0.30  <0.30    Imaging CHEST 2 VIEW  COMPARISON: 03/06/2012  FINDINGS:  Normal heart size and pulmonary vascularity. Peribronchial  thickening with central interstitial changes suggesting chronic  bronchitis. Emphysematous changes in the lungs. No focal airspace  consolidation. No blunting of costophrenic angles. No pneumothorax.  Stable appearance since previous study.  IMPRESSION:  No active cardiopulmonary disease.   Impression  1. Recurrent chest pain concerning for unstable angina. Cardiac markers are normal at this point, ECG abnormal with right bundle branch block and left anterior fascicular block. Cardiac history is vague, patient describes possible old MI and some type of stress testing a few years ago through the [facility]. He has had recurrent chest pain in the hospital requiring nitroglycerin.  2. COPD exacerbation with possible associated bronchitis, no infiltrates by chest x-ray, influenza screen negative. Currently on steroids, nebulizer treatments, and antibiotics. He is afebrile with normal white count.  3.  History includes atrial fibrillation, details are unclear. He is in sinus rhythm at the present time and not anticoagulated.  4. Prior history of stroke.  5. Hypertension.  6. Type 2 diabetes mellitus.   Recommendations  Discussed with patient and wife. Would start aspirin and nitroglycerin paste. Continue Lopressor, Norvasc, and Pravachol. In light of recurring chest pain symptoms concerning for angina, even preceding the present hospitalization, plan will be a diagnostic cardiac catheterization for tomorrow for clear definition of his coronary anatomy and determine if any revascularization options are necessary. He voiced agreement to proceed.   Bruce Sidle, M.D., F.A.C.C.

## 2013-03-27 NOTE — Progress Notes (Signed)
Patient got up to the bedside commode with assistance from wife. Notified by nurse tech that patient stated was having difficulty breathing. At arrival to patient's bedside patient was very anxious, and had audible wheezing noted. Patient also complained of pain. Oxygen saturation was in the 75-80's when he was very anxious and encouraged patient to sit on side of bed after cleaning him. Encouraged patient to relax and to cough and deep breath. Oxygen saturation begin to rise up to the high 80's and eventually to 99%. Notified Respiratory of breathing issues.  Xanax, Morphine and breathing treatment given per PRN orders. Breathing treatment is currently going. Will monitor and assess patient after breathing treatment is done.

## 2013-03-27 NOTE — Progress Notes (Signed)
[facility]                                                                                Patient Demographics  Bruce BirkenheadWilburn Martinez, is a 80 y.o. male, DOB - 09/18/1927, ZOX:[id number]  Admit date - 03/25/2013   Admitting Physician Cristal FordSrikar A Reddy, MD  Outpatient Primary MD for the patient is Pcp Not In System  LOS - 2   Chief Complaint  Patient presents with  . Flu-like Symptoms         Assessment & Plan   Active Problems:   Influenza   COPD exacerbation   Type II or unspecified type diabetes mellitus without mention of complication, uncontrolled   Unspecified hypothyroidism   BPH (benign prostatic hyperplasia)   HTN (hypertension)   Unstable angina  Chest pain -Possible unstable angina -Patient required nitroglycerin, obtained EKG showing right bundle branch block with left anterior fascicular block, troponin negative at this time -Consulted cardiology, plan for catheterization tomorrow  -Continue Norvasc, Lopressor, statin, aspirin and nitro paste added  COPD exacerbation secondary to possible upper respiratory infection -Influenza screen was negative -Chest x-ray was negative for active cardiopulmonary disease -Continue nebulizer treatments, steroids, as well as Levaquin.  Diabetes mellitus type 2 -Continue insulin sliding scale with CBG monitoring -Hemoglobin A1c 8.0  Hypothyroidism - Continue Synthroid  Hypertension -Currently stable, will continue amlodipine, lisinopril, metoprolol  BPH -Continue Proscar, terazosin  PVCs -Continue telemetry monitoring, continued monitor electrolytes as well. -Patient currently asymptomatic -Troponin negative  Hyperlipidemia -Continue statin   Code Status: Full  Family Communication: None at bedside.  Disposition Plan: Admitted.   Procedures None  Consults   Cardiology  DVT Prophylaxis  Heparin  Lab Results  Component Value Date   PLT 141* 03/26/2013    Medications  Scheduled Meds: . albuterol   2.5 mg Nebulization Q4H  . amLODipine  2.5 mg Oral Daily  . aspirin EC  81 mg Oral Daily  . finasteride  5 mg Oral Daily  . heparin  5,000 Units Subcutaneous Q8H  . insulin aspart  0-20 Units Subcutaneous TID WC  . insulin aspart  0-5 Units Subcutaneous QHS  . insulin aspart  3 Units Subcutaneous TID WC  . ketotifen  2 drop Both Eyes BID  . latanoprost  1 drop Both Eyes QHS  . levofloxacin  750 mg Oral Daily  . levothyroxine  25 mcg Oral QAC breakfast  . lisinopril  40 mg Oral Daily  . methylPREDNISolone (SOLU-MEDROL) injection  60 mg Intravenous Q12H  . metoprolol tartrate  25 mg Oral BID  . nitroGLYCERIN  1 inch Topical Q6H  . pantoprazole  40 mg Oral Daily  . pravastatin  40 mg Oral q1800  . terazosin  10 mg Oral QHS   Continuous Infusions: . sodium chloride 125 mL/hr at 03/26/13 2209  . [START ON 03/28/2013] sodium chloride     PRN Meds:.acetaminophen, acetaminophen, ALPRAZolam, ibuprofen, ipratropium-albuterol, morphine injection, ondansetron (ZOFRAN) IV, ondansetron  Antibiotics    Anti-infectives   Start     Dose/Rate Route Frequency Ordered Stop   03/27/13 1130  levofloxacin (LEVAQUIN) tablet 750 mg     750 mg Oral Daily 03/27/13 1034  03/25/13 1000  oseltamivir (TAMIFLU) capsule 30 mg  Status:  Discontinued    Comments:  Tamiflu 30 mg BID for CrCL < 60 mL/min   30 mg Oral 2 times daily 03/25/13 0818 03/26/13 1138   03/25/13 1000  levofloxacin (LEVAQUIN) IVPB 750 mg  Status:  Discontinued     750 mg 100 mL/hr over 90 Minutes Intravenous Every 48 hours 03/25/13 0818 03/27/13 1033   03/25/13 0348  cefTRIAXone (ROCEPHIN) 1 G injection    Comments:  Gertie Baron   : cabinet override      03/25/13 0348 03/25/13 1559   03/25/13 0245  cefTRIAXone (ROCEPHIN) 1 g in dextrose 5 % 50 mL IVPB     1 g 100 mL/hr over 30 Minutes Intravenous  Once 03/25/13 0230 03/25/13 0426   03/25/13 0245  azithromycin (ZITHROMAX) 500 mg in dextrose 5 % 250 mL IVPB     500 mg 250 mL/hr over  60 Minutes Intravenous  Once 03/25/13 0230 03/25/13 0404       Time Spent in minutes   30 minutes   Oluwaseyi Raffel D.O. on 03/27/2013 at 11:40 AM  Between 7am to 7pm - Pager - [phone]  After 7pm go to [email or web address] - password [email or web address]  And look for the night coverage person covering for me after hours  [facility] Office  [phone]    Subjective:   [name] seen and examined today.  Patient continues to have shortness of breath. He does state that he does feel better as compared to previous days, however, has had chest pain and unable to provide details.   Objective:   Filed Vitals:   03/27/13 0850 03/27/13 0900 03/27/13 0915 03/27/13 0936  BP: 193/72 158/63 165/68   Pulse: 96 85 100   Temp: 97.5 F (36.4 C)     TempSrc: Oral     Resp: 20     Height:      Weight:      SpO2: 95%   92%    Wt Readings from Last 3 Encounters:  03/27/13 81.784 kg (180 lb 4.8 oz)     Intake/Output Summary (Last 24 hours) at 03/27/13 1140 Last data filed at 03/27/13 0900  Gross per 24 hour  Intake   2205 ml  Output   1200 ml  Net   1005 ml    Exam  General: Well developed, well nourished, NAD, appears stated age  HEENT: NCAT, PERRLA, EOMI, Anicteic Sclera, mucous membranes moist.   Neck: Supple, no JVD, no masses  Cardiovascular: S1 S2 auscultated, Regular rate and rhythm.  Respiratory: Coarse breath sounds with diminished air movement  Abdomen: Soft, nontender, nondistended, + bowel sounds  Extremities: warm dry without cyanosis clubbing   Neuro: AAOx3, cranial nerves grossly intact.   Skin: Without rashes exudates or nodules  Psych: Normal affect and demeanor with intact judgement and insight, somewhat anxious   Data Review   Micro Results No results found for this or any previous visit (from the past 240 hour(s)).  Radiology Reports Dg Chest 2 View  03/25/2013   CLINICAL DATA:  Chest congestion and cough for 3 days.  EXAM: CHEST  2  VIEW  COMPARISON:  03/06/2012  FINDINGS: Normal heart size and pulmonary vascularity. Peribronchial thickening with central interstitial changes suggesting chronic bronchitis. Emphysematous changes in the lungs. No focal airspace consolidation. No blunting of costophrenic angles. No pneumothorax. Stable appearance since previous study.  IMPRESSION: No active cardiopulmonary disease.   Electronically Signed  By: Burman Nieves M.D.   On: 03/25/2013 01:42    CBC  Recent Labs Lab 03/25/13 0101 03/25/13 0905 03/26/13 0518  WBC 6.8 5.7 6.5  HGB 13.2 13.1 12.4*  HCT 39.9 39.7 37.4*  PLT 137* 132* 141*  MCV 88.1 88.4 89.0  MCH 29.1 29.2 29.5  MCHC 33.1 33.0 33.2  RDW 13.6 13.8 13.9  LYMPHSABS 1.1  --   --   MONOABS 0.5  --   --   EOSABS 0.1  --   --   BASOSABS 0.0  --   --     Chemistries   Recent Labs Lab 03/25/13 0101 03/25/13 0905 03/26/13 0518 03/26/13 1216  NA 137  --  139  --   K 4.1  --  5.3  --   CL 103  --  108  --   CO2 21  --  21  --   GLUCOSE 221*  --  244*  --   BUN 18  --  18  --   CREATININE 1.10 1.01 0.95  --   CALCIUM 9.2  --  8.3*  --   MG  --   --   --  1.8  AST  --   --  13  --   ALT  --   --  11  --   ALKPHOS  --   --  31*  --   BILITOT  --   --  <0.2*  --    ------------------------------------------------------------------------------------------------------------------ estimated creatinine clearance is 57 ml/min (by C-G formula based on Cr of 0.95). ------------------------------------------------------------------------------------------------------------------  Recent Labs  03/26/13 1216  HGBA1C 8.0*   ------------------------------------------------------------------------------------------------------------------ No results found for this basename: CHOL, HDL, LDLCALC, TRIG, CHOLHDL, LDLDIRECT,  in the last 72  hours ------------------------------------------------------------------------------------------------------------------  Recent Labs  03/26/13 1216  TSH 0.776   ------------------------------------------------------------------------------------------------------------------ No results found for this basename: VITAMINB12, FOLATE, FERRITIN, TIBC, IRON, RETICCTPCT,  in the last 72 hours  Coagulation profile No results found for this basename: INR, PROTIME,  in the last 168 hours  No results found for this basename: DDIMER,  in the last 72 hours  Cardiac Enzymes  Recent Labs Lab 03/25/13 2256 03/26/13 0518 03/27/13 1010  TROPONINI <0.30 <0.30 <0.30  <0.30   ------------------------------------------------------------------------------------------------------------------ No components found with this basename: POCBNP,

## 2013-03-27 NOTE — Progress Notes (Signed)
Stated chest pain relieved @ level 0 after 2nd nitroglycerin

## 2013-03-27 NOTE — Progress Notes (Signed)
Stated chest pain down to level # 2.  B/P = 158/63. P = 85.  2nd ntg given.  Spoke with Dr. Catha GosselinMikhail.  Enzymes will be ordered and cardiology consulted.

## 2013-03-27 NOTE — Progress Notes (Signed)
Inpatient Diabetes Program Recommendations  AACE/ADA: New Consensus Statement on Inpatient Glycemic Control (2013)  Target Ranges:  Prepandial:   less than 140 mg/dL      Peak postprandial:   less than 180 mg/dL (1-2 hours)      Critically ill patients:  140 - 180 mg/dL   Reason for Visit: Hyperglycemia  Results for [name] ElizabethBAILEY, Zachory L (MRN [id number]) as of 03/27/2013 18:25  Ref. Range 03/26/2013 16:51 03/26/2013 21:06 03/27/2013 06:09 03/27/2013 11:46 03/27/2013 16:18  Glucose-Capillary Latest Range: 70-99 mg/dL 409285 (H) 811242 (H) 914214 (H) 230 (H) 195 (H)   Results for [name] ElizabethBAILEY, Billye L (MRN [id number]) as of 03/27/2013 18:25  Ref. Range 03/26/2013 12:16  Hemoglobin A1C Latest Range: <5.7 % 8.0 (H)   Hyperglycemia and HgbA1C indicative of poor glycemic control at home. For cath tomorrow.  Recommendations: Begin Lantus 12 units QHS.  Will likely need insulin at discharge. Will continue to follow while inpatient. Thank you. Ailene Ardshonda Issachar Broady, RD, LDN, CDE Inpatient Diabetes Coordinator [phone]

## 2013-03-27 NOTE — Plan of Care (Signed)
Problem: Phase I Progression Outcomes Goal: Other Phase I Outcomes/Goals Outcome: Completed/Met Date Met:  03/27/13 Cardiac Catheterization education video reviewed and discussed.  Permit obtained.  Pt stated understanding of procedure scheduled for 03/28/13

## 2013-03-27 NOTE — Progress Notes (Signed)
Pt c/o left-sided chest pain rating it level # 8.  Denies radiation, skin warm and dry.  Denies nausea.  VSS = b/p of 193/72, p of 70.  12 Lead EKG obtained showing Sinus rhythm with Premature Superventricular complexes and Bifascicular Block.  Chest Pain protocol initiated and 1st ntg SL given at 0855. Dr. Catha GosselinMikhail informed via text page.

## 2013-03-28 ENCOUNTER — Encounter (HOSPITAL_COMMUNITY)
Admission: EM | Disposition: A | Discharge status: Home / Self Care | Payer: Self-pay | Source: Home / Self Care | Attending: Internal Medicine

## 2013-03-28 DIAGNOSIS — I2510 Atherosclerotic heart disease of native coronary artery without angina pectoris: Secondary | ICD-10-CM

## 2013-03-28 HISTORY — PX: LEFT HEART CATHETERIZATION WITH CORONARY ANGIOGRAM: SHX5451

## 2013-03-28 LAB — GLUCOSE, CAPILLARY
GLUCOSE-CAPILLARY: 178 mg/dL — AB (ref 70–99)
GLUCOSE-CAPILLARY: 158 mg/dL — AB (ref 70–99)
GLUCOSE-CAPILLARY: 181 mg/dL — AB (ref 70–99)
Glucose-Capillary: 221 mg/dL — ABNORMAL HIGH (ref 70–99)
Glucose-Capillary: 210 mg/dL — ABNORMAL HIGH (ref 70–99)

## 2013-03-28 LAB — TROPONIN I
Troponin I: <0.3 ng/mL (ref <0.30)
Troponin I: <0.3 ng/mL (ref <0.30)
Troponin I: <0.3 ng/mL (ref <0.30)
Troponin I: <0.3 ng/mL (ref <0.30)

## 2013-03-28 LAB — BASIC METABOLIC PANEL
BUN: 19 mg/dL (ref 6–23)
CHLORIDE: 107 meq/L (ref 96–112)
CO2: 23 meq/L (ref 19–32)
CREATININE: 0.82 mg/dL (ref 0.50–1.35)
Calcium: 7.9 mg/dL — ABNORMAL LOW (ref 8.4–10.5)
GFR calc non Af Amer: 80 mL/min — ABNORMAL LOW (ref >90)
Glucose, Bld: 263 mg/dL — ABNORMAL HIGH (ref 70–99)
Potassium: 4.4 mEq/L (ref 3.7–5.3)
SODIUM: 141 meq/L (ref 137–147)

## 2013-03-28 LAB — CBC
HCT: 36.5 % — ABNORMAL LOW (ref 39.0–52.0)
HEMATOCRIT: 37.2 % — AB (ref 39.0–52.0)
Hemoglobin: 12.1 g/dL — ABNORMAL LOW (ref 13.0–17.0)
Hemoglobin: 12.1 g/dL — ABNORMAL LOW (ref 13.0–17.0)
MCH: 29.2 pg (ref 26.0–34.0)
MCH: 29.2 pg (ref 26.0–34.0)
MCHC: 32.5 g/dL (ref 30.0–36.0)
MCHC: 33.2 g/dL (ref 30.0–36.0)
MCV: 89.9 fL (ref 78.0–100.0)
MCV: 88.2 fL (ref 78.0–100.0)
PLATELETS: 157 10*3/uL (ref 150–400)
PLATELETS: 158 10*3/uL (ref 150–400)
RBC: 4.14 MIL/uL — AB (ref 4.22–5.81)
RBC: 4.14 MIL/uL — AB (ref 4.22–5.81)
RDW: 14.1 % (ref 11.5–15.5)
RDW: 14 % (ref 11.5–15.5)
WBC: 9.7 10*3/uL (ref 4.0–10.5)
WBC: 7.2 10*3/uL (ref 4.0–10.5)

## 2013-03-28 LAB — CREATININE, SERUM
CREATININE: 0.71 mg/dL (ref 0.50–1.35)
GFR calc Af Amer: >90 mL/min (ref >90)
GFR calc non Af Amer: 84 mL/min — ABNORMAL LOW (ref >90)

## 2013-03-28 SURGERY — LEFT HEART CATHETERIZATION WITH CORONARY ANGIOGRAM
Anesthesia: LOCAL

## 2013-03-28 MED ORDER — HEPARIN (PORCINE) IN NACL 2-0.9 UNIT/ML-% IJ SOLN
INTRAMUSCULAR | Status: AC
  Filled 2013-03-28: qty 1000

## 2013-03-28 MED ORDER — HYDRALAZINE HCL 20 MG/ML IJ SOLN: 10.0000 mg | INTRAMUSCULAR | Status: DC | PRN

## 2013-03-28 MED ORDER — ASPIRIN 81 MG PO CHEW
CHEWABLE_TABLET | ORAL | Status: AC
  Filled 2013-03-28: qty 1

## 2013-03-28 MED ORDER — LIDOCAINE HCL (PF) 1 % IJ SOLN
INTRAMUSCULAR | Status: AC
  Filled 2013-03-28: qty 30

## 2013-03-28 MED ORDER — VERAPAMIL HCL 2.5 MG/ML IV SOLN
INTRAVENOUS | Status: AC
  Filled 2013-03-28: qty 2

## 2013-03-28 MED ORDER — HEPARIN SODIUM (PORCINE) 1000 UNIT/ML IJ SOLN
INTRAMUSCULAR | Status: AC
  Filled 2013-03-28: qty 1

## 2013-03-28 MED ORDER — SODIUM CHLORIDE 0.9 % IV SOLN
1.0000 mL/kg/h | INTRAVENOUS | Status: AC
  Administered 2013-03-28 (×2): 1 mL/kg/h via INTRAVENOUS

## 2013-03-28 MED ORDER — NITROGLYCERIN 0.2 MG/ML ON CALL CATH LAB
INTRAVENOUS | Status: AC
  Filled 2013-03-28: qty 1

## 2013-03-28 MED ORDER — HEPARIN SODIUM (PORCINE) 5000 UNIT/ML IJ SOLN
5000.0000 [IU] | Freq: Three times a day (TID) | INTRAMUSCULAR | Status: DC
  Administered 2013-03-28 – 2013-03-29 (×3): 5000 [IU] via SUBCUTANEOUS

## 2013-03-28 MED ORDER — INSULIN GLARGINE 100 UNIT/ML ~~LOC~~ SOLN
12.0000 [IU] | Freq: Every day | SUBCUTANEOUS | Status: DC
  Administered 2013-03-28: 22:00:00 12 [IU] via SUBCUTANEOUS
  Filled 2013-03-28 (×2): qty 0.12

## 2013-03-28 MED ORDER — MIDAZOLAM HCL 2 MG/2ML IJ SOLN
INTRAMUSCULAR | Status: AC
  Filled 2013-03-28: qty 2

## 2013-03-28 NOTE — Progress Notes (Signed)
Patient's blood pressure remained elevated to 160s-170s systolic after rescheduled lisinopril administration (due to cardiac cath).  PA notified, order placed for PRN if systolic BP exceeds 170.  Will continue to monitor.

## 2013-03-28 NOTE — Progress Notes (Signed)
Patient returned to unit from cath lab; right radial cath performed without complication.  Patient is alert and oriented x4, family at bedside.  Will begin TR band deflation per protocol.

## 2013-03-28 NOTE — Interval H&P Note (Signed)
History and Physical Interval Note:  03/28/2013 9:07 AM  [name] L Delorenzo  has presented today for surgery, with the diagnosis of cp  The various methods of treatment have been discussed with the patient and family. After consideration of risks, benefits and other options for treatment, the patient has consented to  Procedure(s): LEFT HEART CATHETERIZATION WITH CORONARY ANGIOGRAM (N/A) as a surgical intervention .  The patient's history has been reviewed, patient examined, no change in status, stable for surgery.  I have reviewed the patient's chart and labs.  Questions were answered to the patient's satisfaction.   Cath Lab Visit (complete for each Cath Lab visit)  Clinical Evaluation Leading to the Procedure:   ACS: no  Non-ACS:    Anginal Classification: CCS IV  Anti-ischemic medical therapy: Maximal Therapy (2 or more classes of medications)  Non-Invasive Test Results: No non-invasive testing performed  Prior CABG: No previous CABG        Theron AristaPeter Charlotte Surgery CenterJordanMD,FACC 03/28/2013 9:07 AM

## 2013-03-28 NOTE — H&P (View-Only) (Signed)
[facility] Consult[facility] cardiologist: Dr. Jonelle SidleSamuel G. McDowell  Clinical Summary [name] is an 80 y.o.male currently admitted with flu-like symptoms and fever associated with hypoxic respiratory failure and cough. Influenza screen is negative. He has no infiltrates by chest x-ray. Currently being treated for COPD exacerbation, on breathing treatments, steroids, and antibiotics.  During hospitalization he has had recurring episodes of chest tightness, states this is usually worse after his breathing treatments when his heart rate goes up. He also tells me that he has been experiencing similar symptoms at home with exertion and takes nitroglycerin for this.  Cardiac history is vague. [facility], states that he has seen a cardiologist in the last few years and had what sounds like a stress test a few years ago, may have had a heart attack years back.  During this hospitalization cardiac markers have been negative, and ECG is abnormal with right bundle branch block and left anterior fascicular block.  Episode of chest pain this morning resolved after two sublingual nitroglycerin, but recurred later.   No Known Allergies  Medications Scheduled Medications: . albuterol  2.5 mg Nebulization Q4H  . amLODipine  2.5 mg Oral Daily  . finasteride  5 mg Oral Daily  . heparin  5,000 Units Subcutaneous Q8H  . insulin aspart  0-20 Units Subcutaneous TID WC  . insulin aspart  0-5 Units Subcutaneous QHS  . insulin aspart  3 Units Subcutaneous TID WC  . ketotifen  2 drop Both Eyes BID  . latanoprost  1 drop Both Eyes QHS  . levofloxacin  750 mg Oral Daily  . levothyroxine  25 mcg Oral QAC breakfast  . lisinopril  40 mg Oral Daily  . methylPREDNISolone (SOLU-MEDROL) injection  60 mg Intravenous Q12H  . metoprolol tartrate  25 mg Oral BID  . pantoprazole  40 mg Oral Daily  . pravastatin  40 mg Oral q1800  . terazosin  10 mg Oral QHS     Infusions: . sodium chloride 125 mL/hr at 03/26/13 2209    PRN Medications: acetaminophen, acetaminophen, ALPRAZolam, ibuprofen, ipratropium-albuterol, morphine injection, ondansetron (ZOFRAN) IV, ondansetron   Past Medical History  Diagnosis Date  . CVA (cerebral infarction)   . COPD (chronic obstructive pulmonary disease)   . Essential hypertension, benign   . Diabetes mellitus without complication   . Atrial fibrillation   . Hard of hearing     Past Surgical History  Procedure Laterality Date  . Hernia repair      History reviewed. No pertinent family history.  Social History [name] reports that he has quit smoking. His smoking use included Cigarettes. He smoked 0.00 packs per day. He has quit using smokeless tobacco. [name] reports that he does not drink alcohol.  Review of Systems No palpitations or syncope. No reported bleeding problems. States he has trouble with his memory after previous stroke. Otherwise as outlined above.  Physical Examination Blood pressure 165/68, pulse 100, temperature 97.5 F (36.4 C), temperature source Oral, resp. rate 20, height 5\' 8"  (1.727 m), weight 180 lb 4.8 oz (81.784 kg), SpO2 92.00%.  Intake/Output Summary (Last 24 hours) at 03/27/13 1120 Last data filed at 03/27/13 0900  Gross per 24 hour  Intake   2205 ml  Output   1200 ml  Net   1005 ml    No distress but complains of recurrent chest pain. HEENT: Conjunctiva and lids normal, oropharynx clear. Neck: Supple, no elevated JVP or  carotid bruits, no thyromegaly. Lungs: Decreased breath sounds throughout with end expiratory wheezes, nonlabored breathing at rest. Cardiac: Regular rate and rhythm, no S3, 2/6 systolic murmur, no pericardial rub. Abdomen: Soft, nontender, bowel sounds present, no guarding or rebound. Extremities: No pitting edema, distal pulses 1-2+. Skin: Warm and dry. Musculoskeletal: No kyphosis. Neuropsychiatric: Alert and oriented x3, affect  grossly appropriate.   Lab Results  Basic Metabolic Panel:  Recent Labs Lab 03/25/13 0101 03/25/13 0905 03/26/13 0518 03/26/13 1216  NA 137  --  139  --   K 4.1  --  5.3  --   CL 103  --  108  --   CO2 21  --  21  --   GLUCOSE 221*  --  244*  --   BUN 18  --  18  --   CREATININE 1.10 1.01 0.95  --   CALCIUM 9.2  --  8.3*  --   MG  --   --   --  1.8  PHOS  --   --   --  1.8*    Liver Function Tests:  Recent Labs Lab 03/26/13 0518  AST 13  ALT 11  ALKPHOS 31*  BILITOT <0.2*  PROT 5.8*  ALBUMIN 3.0*    CBC:  Recent Labs Lab 03/25/13 0101 03/25/13 0905 03/26/13 0518  WBC 6.8 5.7 6.5  NEUTROABS 5.1  --   --   HGB 13.2 13.1 12.4*  HCT 39.9 39.7 37.4*  MCV 88.1 88.4 89.0  PLT 137* 132* 141*    Cardiac Enzymes:  Recent Labs Lab 03/25/13 1841 03/25/13 2256 03/26/13 0518 03/27/13 1010  TROPONINI <0.30 <0.30 <0.30 <0.30  <0.30    Imaging CHEST 2 VIEW  COMPARISON: 03/06/2012  FINDINGS:  Normal heart size and pulmonary vascularity. Peribronchial  thickening with central interstitial changes suggesting chronic  bronchitis. Emphysematous changes in the lungs. No focal airspace  consolidation. No blunting of costophrenic angles. No pneumothorax.  Stable appearance since previous study.  IMPRESSION:  No active cardiopulmonary disease.   Impression  1. Recurrent chest pain concerning for unstable angina. Cardiac markers are normal at this point, ECG abnormal with right bundle branch block and left anterior fascicular block. Cardiac history is vague, patient describes possible old MI and some type of stress testing a few years ago through the [facility]. He has had recurrent chest pain in the hospital requiring nitroglycerin.  2. COPD exacerbation with possible associated bronchitis, no infiltrates by chest x-ray, influenza screen negative. Currently on steroids, nebulizer treatments, and antibiotics. He is afebrile with normal white count.  3.  History includes atrial fibrillation, details are unclear. He is in sinus rhythm at the present time and not anticoagulated.  4. Prior history of stroke.  5. Hypertension.  6. Type 2 diabetes mellitus.   Recommendations  Discussed with patient and wife. Would start aspirin and nitroglycerin paste. Continue Lopressor, Norvasc, and Pravachol. In light of recurring chest pain symptoms concerning for angina, even preceding the present hospitalization, plan will be a diagnostic cardiac catheterization for tomorrow for clear definition of his coronary anatomy and determine if any revascularization options are necessary. He voiced agreement to proceed.   Jonelle Sidle, M.D., F.A.C.C.

## 2013-03-28 NOTE — CV Procedure (Signed)
    Cardiac Catheterization Procedure Note  Name: [name] L Breon MRN: [id number] DOB: 06/30/1927  Procedure: Left Heart Cath, Selective Coronary Angiography, LV angiography  Indication: 80 yo WM admitted with COPD exacerbation. He has recurrent chest pain at rest.   Procedural Details: The right wrist was prepped, draped, and anesthetized with 1% lidocaine. Using the modified Seldinger technique, a 5 French sheath was introduced into the right radial artery. 3 mg of verapamil was administered through the sheath, weight-based unfractionated heparin was administered intravenously. Standard Judkins catheters were used for selective coronary angiography and left ventriculography. Catheter exchanges were performed over an exchange length guidewire. There were no immediate procedural complications. A TR band was used for radial hemostasis at the completion of the procedure.  The patient was transferred to the post catheterization recovery area for further monitoring.  Procedural Findings: Hemodynamics: AO 182/75 mean 120 mm Hg LV 175/28 mm Hg  Coronary angiography: Coronary dominance: right  Left mainstem: Normal  Left anterior descending (LAD): 30% stenosis at first diagonal takeoff. 30% proximal first diagonal.   Ramus intermediate: moderate size, 20-30% proximal.  Left circumflex (LCx): 40% stenosis in the distal vessel prior to a large terminal OM.  Right coronary artery (RCA): Mild irregularities less than 20%.  Left ventriculography: Left ventricular systolic function is normal, LVEF is estimated at 55-65%, there is no significant mitral regurgitation   Final Conclusions:   1. Nonobstructive CAD 2. Normal LV function.  Recommendations: Treat COPD exacerbation. Given active wheezing will discontinue beta blocker therapy.  Theron Aristaeter [facility] Center - HopewellJordanMD,FACC 03/28/2013, 9:34 AM

## 2013-03-28 NOTE — Progress Notes (Signed)
Patient had 10 beats with wide QRS.  Patient asymptomatic.  PA notified.  Will continue to monitor.

## 2013-03-28 NOTE — Progress Notes (Signed)
[facility]                                                                                Patient Demographics  [name], is a 80 y.o. male, DOB - Jan 13, 1928, FOY:[id number]  Admit date - 03/25/2013   Admitting Physician Cristal Ford, MD  Outpatient Primary MD for the patient is Pcp Not In System  LOS - 3   Chief Complaint  Patient presents with  . Flu-like Symptoms         Assessment & Plan   Active Problems:   Influenza   COPD exacerbation   Type II or unspecified type diabetes mellitus without mention of complication, uncontrolled   Unspecified hypothyroidism   BPH (benign prostatic hyperplasia)   HTN (hypertension)   Unstable angina   COPD exacerbation secondary to possible upper respiratory infection -Influenza screen was negative -Chest x-ray was negative for active cardiopulmonary disease -Continue nebulizer treatments, steroids, as well as Levaquin.  Chest pain -Patient required nitroglycerin, obtained EKG showing right bundle branch block with left anterior fascicular block, troponin negative at this time -Consulted cardiology, catheterization today: Nonobstructive CAD, normal LV function -Continue Norvasc, Lopressor, statin, aspirin and nitro paste added  Diabetes mellitus type 2 -Continue insulin sliding scale with CBG monitoring -Hemoglobin A1c 8.0 -Diabetes coordinator recommended using Lantus 12units QHS  Hypothyroidism - Continue Synthroid  Hypertension -Currently stable, will continue amlodipine, lisinopril, metoprolol  BPH -Continue Proscar, terazosin  PVCs -Continue telemetry monitoring, continued monitor electrolytes as well. -Patient currently asymptomatic -Troponin negative  Hyperlipidemia -Continue statin   Code Status: Full  Family Communication: None at bedside.  Disposition Plan: Admitted.   Procedures None  Consults   Cardiology  DVT Prophylaxis  Heparin  Lab Results  Component Value Date   PLT 157  03/28/2013    Medications  Scheduled Meds: . amLODipine  2.5 mg Oral Daily  . aspirin EC  81 mg Oral Daily  . finasteride  5 mg Oral Daily  . heparin  5,000 Units Subcutaneous Q8H  . insulin aspart  0-20 Units Subcutaneous TID WC  . insulin aspart  0-5 Units Subcutaneous QHS  . insulin aspart  3 Units Subcutaneous TID WC  . insulin glargine  12 Units Subcutaneous QHS  . ketotifen  2 drop Both Eyes BID  . latanoprost  1 drop Both Eyes QHS  . levalbuterol  0.63 mg Nebulization Q4H  . levofloxacin  750 mg Oral Daily  . levothyroxine  25 mcg Oral QAC breakfast  . lisinopril  40 mg Oral Daily  . methylPREDNISolone (SOLU-MEDROL) injection  60 mg Intravenous Q12H  . pantoprazole  40 mg Oral Daily  . pravastatin  40 mg Oral q1800  . terazosin  10 mg Oral QHS   Continuous Infusions: . sodium chloride 10 mL/hr (03/28/13 0057)  . sodium chloride 1 mL/kg/hr (03/28/13 1010)   PRN Meds:.acetaminophen, acetaminophen, ALPRAZolam, ibuprofen, ipratropium-albuterol, morphine injection, ondansetron (ZOFRAN) IV, ondansetron  Antibiotics    Anti-infectives   Start     Dose/Rate Route Frequency Ordered Stop   03/27/13 1130  levofloxacin (LEVAQUIN) tablet 750 mg     750 mg Oral Daily 03/27/13 1034  03/25/13 1000  oseltamivir (TAMIFLU) capsule 30 mg  Status:  Discontinued    Comments:  Tamiflu 30 mg BID for CrCL < 60 mL/min   30 mg Oral 2 times daily 03/25/13 0818 03/26/13 1138   03/25/13 1000  levofloxacin (LEVAQUIN) IVPB 750 mg  Status:  Discontinued     750 mg 100 mL/hr over 90 Minutes Intravenous Every 48 hours 03/25/13 0818 03/27/13 1033   03/25/13 0348  cefTRIAXone (ROCEPHIN) 1 G injection    Comments:  Gertie Baron   : cabinet override      03/25/13 0348 03/25/13 1559   03/25/13 0245  cefTRIAXone (ROCEPHIN) 1 g in dextrose 5 % 50 mL IVPB     1 g 100 mL/hr over 30 Minutes Intravenous  Once 03/25/13 0230 03/25/13 0426   03/25/13 0245  azithromycin (ZITHROMAX) 500 mg in dextrose 5 % 250  mL IVPB     500 mg 250 mL/hr over 60 Minutes Intravenous  Once 03/25/13 0230 03/25/13 0404       Time Spent in minutes   25 minutes   Mortimer Bair D.O. on 03/28/2013 at 11:24 AM  Between 7am to 7pm - Pager - [phone]  After 7pm go to [email or web address] - password [email or web address]  And look for the night coverage person covering for me after hours  [facility] Office  [phone]    Subjective:   [name] seen and examined today.  Patient continues to have shortness of breath, but states it has improved.   He is awaiting his cath this morning.     Objective:   Filed Vitals:   03/28/13 0949 03/28/13 1000 03/28/13 1015 03/28/13 1030  BP: 175/74 165/69 163/65 165/67  Pulse: 72 69 64 66  Temp:      TempSrc:      Resp:      Height:      Weight:      SpO2: 92% 92% 93% 92%    Wt Readings from Last 3 Encounters:  03/27/13 81.7 kg (180 lb 1.9 oz)  03/27/13 81.7 kg (180 lb 1.9 oz)     Intake/Output Summary (Last 24 hours) at 03/28/13 1124 Last data filed at 03/28/13 0900  Gross per 24 hour  Intake 1969.59 ml  Output   1375 ml  Net 594.59 ml    Exam  General: Well developed, well nourished, NAD, appears stated age  HEENT: NCAT, PERRLA, EOMI, Anicteic Sclera, mucous membranes moist.   Neck: Supple, no JVD, no masses  Cardiovascular: S1 S2 auscultated, Regular rate and rhythm.  Respiratory: Coarse breath sounds with diminished air movement  Abdomen: Soft, nontender, nondistended, + bowel sounds  Extremities: warm dry without cyanosis clubbing   Neuro: AAOx3, cranial nerves grossly intact.   Skin: Without rashes exudates or nodules  Psych: Normal affect and demeanor with intact judgement and insight, somewhat anxious  Data Review   Micro Results No results found for this or any previous visit (from the past 240 hour(s)).  Radiology Reports Dg Chest 2 View  03/25/2013   CLINICAL DATA:  Chest congestion and cough for 3 days.  EXAM: CHEST  2  VIEW  COMPARISON:  03/06/2012  FINDINGS: Normal heart size and pulmonary vascularity. Peribronchial thickening with central interstitial changes suggesting chronic bronchitis. Emphysematous changes in the lungs. No focal airspace consolidation. No blunting of costophrenic angles. No pneumothorax. Stable appearance since previous study.  IMPRESSION: No active cardiopulmonary disease.   Electronically Signed   By: Marisa Cyphers.D.  On: 03/25/2013 01:42    CBC  Recent Labs Lab 03/25/13 0101 03/25/13 0905 03/26/13 0518 03/28/13 0240  WBC 6.8 5.7 6.5 9.7  HGB 13.2 13.1 12.4* 12.1*  HCT 39.9 39.7 37.4* 37.2*  PLT 137* 132* 141* 157  MCV 88.1 88.4 89.0 89.9  MCH 29.1 29.2 29.5 29.2  MCHC 33.1 33.0 33.2 32.5  RDW 13.6 13.8 13.9 14.1  LYMPHSABS 1.1  --   --   --   MONOABS 0.5  --   --   --   EOSABS 0.1  --   --   --   BASOSABS 0.0  --   --   --     Chemistries   Recent Labs Lab 03/25/13 0101 03/25/13 0905 03/26/13 0518 03/26/13 1216 03/28/13 0240  NA 137  --  139  --  141  K 4.1  --  5.3  --  4.4  CL 103  --  108  --  107  CO2 21  --  21  --  23  GLUCOSE 221*  --  244*  --  263*  BUN 18  --  18  --  19  CREATININE 1.10 1.01 0.95  --  0.82  CALCIUM 9.2  --  8.3*  --  7.9*  MG  --   --   --  1.8  --   AST  --   --  13  --   --   ALT  --   --  11  --   --   ALKPHOS  --   --  31*  --   --   BILITOT  --   --  <0.2*  --   --    ------------------------------------------------------------------------------------------------------------------ estimated creatinine clearance is 66 ml/min (by C-G formula based on Cr of 0.82). ------------------------------------------------------------------------------------------------------------------  Recent Labs  03/26/13 1216  HGBA1C 8.0*   ------------------------------------------------------------------------------------------------------------------ No results found for this basename: CHOL, HDL, LDLCALC, TRIG, CHOLHDL,  LDLDIRECT,  in the last 72 hours ------------------------------------------------------------------------------------------------------------------  Recent Labs  03/26/13 1216  TSH 0.776   ------------------------------------------------------------------------------------------------------------------ No results found for this basename: VITAMINB12, FOLATE, FERRITIN, TIBC, IRON, RETICCTPCT,  in the last 72 hours  Coagulation profile  Recent Labs Lab 03/27/13 1424  INR 1.12    No results found for this basename: DDIMER,  in the last 72 hours  Cardiac Enzymes  Recent Labs Lab 03/27/13 1010 03/27/13 2045 03/28/13 0240  TROPONINI <0.30  <0.30 <0.30 <0.30   ------------------------------------------------------------------------------------------------------------------ No components found with this basename: POCBNP,

## 2013-03-28 NOTE — Progress Notes (Signed)
Upon removing TR band 30 minutes after deflating all air 3mL at a time over 15 minute intervals and monitoring for bleeding or hematoma, noted that patient began to bleed from site.  Replaced TR band with 12mL of air (amount originally placed cath lab post-procedure) and began TR band deflation and frequent vitals protocols.  Will continue to monitor.

## 2013-03-28 NOTE — Progress Notes (Signed)
Notified on-call hospitalist MD of episode earlier in shift. Orders given to decrease infusing fluids of Normal saline to 10-5020ml/hr. Currently patient is resting with no signs and symptoms of difficulty breathing. Wife is at the bedside. Will continue to monitor to end of shift.

## 2013-03-29 LAB — TROPONIN I: Troponin I: <0.3 ng/mL (ref <0.30)

## 2013-03-29 LAB — GLUCOSE, CAPILLARY
GLUCOSE-CAPILLARY: 156 mg/dL — AB (ref 70–99)
GLUCOSE-CAPILLARY: 164 mg/dL — AB (ref 70–99)

## 2013-03-29 MED ORDER — IPRATROPIUM-ALBUTEROL 0.5-2.5 (3) MG/3ML IN SOLN: 3.0000 mL | RESPIRATORY_TRACT | Status: DC | PRN

## 2013-03-29 MED ORDER — PREDNISONE (PAK) 10 MG PO TABS: ORAL_TABLET | Freq: Every day | ORAL | Status: DC

## 2013-03-29 MED ORDER — ASPIRIN 81 MG PO TBEC: 81.0000 mg | DELAYED_RELEASE_TABLET | Freq: Every day | ORAL | Status: DC

## 2013-03-29 MED ORDER — LEVOFLOXACIN 750 MG PO TABS
750.0000 mg | ORAL_TABLET | Freq: Every day | ORAL | Status: DC
Start: 2013-03-29 — End: 2014-10-29

## 2013-03-29 NOTE — Discharge Summary (Addendum)
Physician Discharge Summary  [name] ZOX:[id number] DOB: 1927-10-18 DOA: 03/25/2013  PCP: Pcp Not In System  Admit date: 03/25/2013 Discharge date: 03/29/2013  Time spent: 35 minutes  Recommendations for Outpatient Follow-up:  Patient is to followup with primary care physician within one week of discharge. He continue taking his medication as prescribed.   Discharge Diagnoses:  Active Problems:   COPD exacerbation contrary to possible coverage for infection   Type II or unspecified type diabetes mellitus without mention of complication, uncontrolled   Unspecified hypothyroidism   BPH (benign prostatic hyperplasia)   HTN (hypertension)   Unstable angina    Discharge Condition: Stable  Diet recommendation: Heart healthy  Filed Weights   03/27/13 0431 03/27/13 1851 03/29/13 0714  Weight: 81.784 kg (180 lb 4.8 oz) 81.7 kg (180 lb 1.9 oz) 81.511 kg (179 lb 11.2 oz)    History of present illness:  [name] is a 80 y.o. male  With a hx of copd, dm, and htn who presents to the ED with complaints of flu like symptoms and fevers. In the ED, the patient was noted to be hypoxic, requiring supplemental O2. Given the above, the hospitalist service was asked to admit the patient for further work-up.   Hospital Course:  This is an 80 year old male with a history of COPD, diabetes mellitus type 2, hypothyroidism, hypertension that was admitted for shortness of breath and flulike symptoms. Patient's influenza PCR was negative. His chest x-ray was also negative for any active cardiopulmonary disease. Patient was continued on nebulizer treatments steroids as well as Levaquin. His shortness of breath did improve during his hospital course. Patient will be discharged with nebulizer treatments as well as a prednisone taper and Levaquin. Patient did develop some chest pain during his hospital course. He required 2 nitroglycerin tablets. His EKG showed a right bundle branch block with  left intravesicular block. History per his remained negative. Cardiology was consulted and catheterization was conducted showing a nonobstructive coronary artery disease with normal left ventricular function. Patient was continued on his on Norvasc as well as a statin and aspirin was added. His Lopressor was held due to shortness of breath. Patient also has a history of diabetes mellitus type 2, his hemoglobin A1c was found to be 8. Patient was placed on insulin sliding scale as well as Lantus. He will be discharged back with his metformin. He should discuss with his primary care physician in need to start insulin requirements. Patient does have a history of hypothyroidism, his Synthroid was continued. His hypertension did remain stable, again his metoprolol was held due to his shortness of breath. Patient also has a history of BPH, proscar and terazosin were continued.  Patient was noted to have frequent PVCs on telemetry monitoring, however slight right are found to be normal and patient was asymptomatic his troponins again were negative. He was continued on his statin therapy for hyperlipidemia. Patient will be discharged home he'll be discharged nebulizer and nebulizer treatments as well as a prednisone taper and Levaquin and home oxygen. He should continue taking his medications as prescribed. Patient should see his primary care physician within one week of discharge. This was discussed with the patient as well as his wife at bedside they do understand agree.  Procedures: Cardiac catheterization: Shows nonobstructive coronary disease, normal left ventricular function.  Consultations: Cardiology  Discharge Exam: Filed Vitals:   03/29/13 0917  BP: 164/62  Pulse: 77  Temp: 98.2 F (36.8 C)  Resp: 18  Exam  General: Well developed, well nourished, NAD, appears stated age  HEENT: NCAT, PERRLA, EOMI, Anicteic Sclera, mucous membranes moist.  Neck: Supple, no JVD, no masses  Cardiovascular: S1 S2  auscultated, Regular rate and rhythm.  Respiratory: Breath sounds clear to auscultation. Abdomen: Soft, nontender, nondistended, + bowel sounds  Extremities: warm dry without cyanosis clubbing  Neuro: AAOx3, cranial nerves grossly intact.  Skin: Without rashes exudates or nodules  Psych: Normal affect and demeanor with intact judgement and insight   Discharge Instructions  Discharge Orders   Future Orders Complete By Expires   Diet - low sodium heart healthy  As directed    Discharge instructions  As directed    Comments:     Patient is to followup with primary care physician within one week of discharge. He continue taking his medication as prescribed.   DME Nebulizer/meds  As directed    Increase activity slowly  As directed        Medication List    STOP taking these medications       metoprolol tartrate 25 MG tablet  Commonly known as:  LOPRESSOR      TAKE these medications       amLODipine 5 MG tablet  Commonly known as:  NORVASC  Take 2.5 mg by mouth daily.     aspirin 81 MG EC tablet  Take 1 tablet (81 mg total) by mouth daily.     COMBIVENT RESPIMAT 20-100 MCG/ACT Aers respimat  Generic drug:  Ipratropium-Albuterol  Inhale 2 puffs into the lungs every 6 (six) hours as needed for wheezing.     ipratropium-albuterol 0.5-2.5 (3) MG/3ML Soln  Commonly known as:  DUONEB  Take 3 mLs by nebulization every 2 (two) hours as needed.     finasteride 5 MG tablet  Commonly known as:  PROSCAR  Take 5 mg by mouth daily.     ketotifen 0.025 % ophthalmic solution  Commonly known as:  ZADITOR  Place 2 drops into both eyes 2 (two) times daily.     latanoprost 0.005 % ophthalmic solution  Commonly known as:  XALATAN  Place 1 drop into both eyes at bedtime.     levofloxacin 750 MG tablet  Commonly known as:  LEVAQUIN  Take 1 tablet (750 mg total) by mouth daily.     levothyroxine 25 MCG tablet  Commonly known as:  SYNTHROID, LEVOTHROID  Take 25 mcg by mouth daily  before breakfast.     lisinopril 40 MG tablet  Commonly known as:  PRINIVIL,ZESTRIL  Take 40 mg by mouth daily.     metFORMIN 500 MG tablet  Commonly known as:  GLUCOPHAGE  Take 500 mg by mouth 2 (two) times daily with a meal.     multivitamin with minerals tablet  Take 1 tablet by mouth daily.     omeprazole 20 MG capsule  Commonly known as:  PRILOSEC  Take 20 mg by mouth 2 (two) times daily before a meal.     pravastatin 40 MG tablet  Commonly known as:  PRAVACHOL  Take 40 mg by mouth daily.     predniSONE 10 MG tablet  Commonly known as:  STERAPRED UNI-PAK  - Take by mouth daily. Prednisone dosing: Take  Prednisone 40mg  (4 tabs) x 3 days, then taper to 30mg  (3 tabs) x 3 days, then 20mg  (2 tabs) x 3days, then 10mg  (1 tab) x 3days, then OFF.  -   - Dispense:  30 tabs, refills: None  terazosin 10 MG capsule  Commonly known as:  HYTRIN  Take 10 mg by mouth at bedtime.       No Known Allergies     Follow-up Information   Follow up with Primary care physician. Schedule an appointment as soon as possible for a visit in 1 week.       The results of significant diagnostics from this hospitalization (including imaging, microbiology, ancillary and laboratory) are listed below for reference.    Significant Diagnostic Studies: Dg Chest 2 View  03/25/2013   CLINICAL DATA:  Chest congestion and cough for 3 days.  EXAM: CHEST  2 VIEW  COMPARISON:  03/06/2012  FINDINGS: Normal heart size and pulmonary vascularity. Peribronchial thickening with central interstitial changes suggesting chronic bronchitis. Emphysematous changes in the lungs. No focal airspace consolidation. No blunting of costophrenic angles. No pneumothorax. Stable appearance since previous study.  IMPRESSION: No active cardiopulmonary disease.   Electronically Signed   By: Burman NievesWilliam  Stevens M.D.   On: 03/25/2013 01:42    Microbiology: No results found for this or any previous visit (from the past 240 hour(s)).     Labs: Basic Metabolic Panel:  Recent Labs Lab 03/25/13 0101 03/25/13 0905 03/26/13 0518 03/26/13 1216 03/28/13 0240 03/28/13 1130  NA 137  --  139  --  141  --   K 4.1  --  5.3  --  4.4  --   CL 103  --  108  --  107  --   CO2 21  --  21  --  23  --   GLUCOSE 221*  --  244*  --  263*  --   BUN 18  --  18  --  19  --   CREATININE 1.10 1.01 0.95  --  0.82 0.71  CALCIUM 9.2  --  8.3*  --  7.9*  --   MG  --   --   --  1.8  --   --   PHOS  --   --   --  1.8*  --   --    Liver Function Tests:  Recent Labs Lab 03/26/13 0518  AST 13  ALT 11  ALKPHOS 31*  BILITOT <0.2*  PROT 5.8*  ALBUMIN 3.0*   No results found for this basename: LIPASE, AMYLASE,  in the last 168 hours No results found for this basename: AMMONIA,  in the last 168 hours CBC:  Recent Labs Lab 03/25/13 0101 03/25/13 0905 03/26/13 0518 03/28/13 0240 03/28/13 1130  WBC 6.8 5.7 6.5 9.7 7.2  NEUTROABS 5.1  --   --   --   --   HGB 13.2 13.1 12.4* 12.1* 12.1*  HCT 39.9 39.7 37.4* 37.2* 36.5*  MCV 88.1 88.4 89.0 89.9 88.2  PLT 137* 132* 141* 157 158   Cardiac Enzymes:  Recent Labs Lab 03/28/13 1130 03/28/13 1431 03/28/13 2119 03/29/13 0257 03/29/13 0926  TROPONINI <0.30 <0.30 <0.30 <0.30 <0.30   BNP: BNP (last 3 results) No results found for this basename: PROBNP,  in the last 8760 hours CBG:  Recent Labs Lab 03/28/13 0636 03/28/13 1126 03/28/13 1537 03/28/13 2032 03/29/13 0725  GLUCAP 221* 210* 158* 181* 156*       Signed:  Macon Lesesne  [facility] 03/29/2013, 11:05 AM

## 2013-03-29 NOTE — Progress Notes (Signed)
SUBJECTIVE:  No new chest pain or SOB   PHYSICAL EXAM Filed Vitals:   03/29/13 0222 03/29/13 0421 03/29/13 0714 03/29/13 0721  BP: 160/85  170/69   Pulse: 82  72   Temp: 98.2 F (36.8 C)  98.5 F (36.9 C)   TempSrc: Oral  Oral   Resp: 18  18   Height:      Weight:   179 lb 11.2 oz (81.511 kg)   SpO2: 96% 95% 94% 97%   General:  No distress Lungs:  Decreased breath sounds Heart:  RRR Abdomen:  Positive bowel sounds, no rebound no guarding Extremities:  Right wrist without bruising or bleeding.   LABS: Lab Results  Component Value Date   TROPONINI <0.30 03/29/2013   Results for orders placed during the hospital encounter of 03/25/13 (from the past 24 hour(s))  GLUCOSE, CAPILLARY     Status: Abnormal   Collection Time    03/28/13 11:26 AM      Result Value Range   Glucose-Capillary 210 (*) 70 - 99 mg/dL   Comment 1 Notify RN    TROPONIN I     Status: None   Collection Time    03/28/13 11:30 AM      Result Value Range   Troponin I <0.30  <0.30 ng/mL  CBC     Status: Abnormal   Collection Time    03/28/13 11:30 AM      Result Value Range   WBC 7.2  4.0 - 10.5 K/uL   RBC 4.14 (*) 4.22 - 5.81 MIL/uL   Hemoglobin 12.1 (*) 13.0 - 17.0 g/dL   HCT 40.9 (*) 81.1 - 91.4 %   MCV 88.2  78.0 - 100.0 fL   MCH 29.2  26.0 - 34.0 pg   MCHC 33.2  30.0 - 36.0 g/dL   RDW 78.2  95.6 - 21.3 %   Platelets 158  150 - 400 K/uL  CREATININE, SERUM     Status: Abnormal   Collection Time    03/28/13 11:30 AM      Result Value Range   Creatinine, Ser 0.71  0.50 - 1.35 mg/dL   GFR calc non Af Amer 84 (*) >90 mL/min   GFR calc Af Amer >90  >90 mL/min  TROPONIN I     Status: None   Collection Time    03/28/13  2:31 PM      Result Value Range   Troponin I <0.30  <0.30 ng/mL  GLUCOSE, CAPILLARY     Status: Abnormal   Collection Time    03/28/13  3:37 PM      Result Value Range   Glucose-Capillary 158 (*) 70 - 99 mg/dL   Comment 1 Notify RN    GLUCOSE, CAPILLARY     Status: Abnormal    Collection Time    03/28/13  8:32 PM      Result Value Range   Glucose-Capillary 181 (*) 70 - 99 mg/dL  TROPONIN I     Status: None   Collection Time    03/28/13  9:19 PM      Result Value Range   Troponin I <0.30  <0.30 ng/mL  TROPONIN I     Status: None   Collection Time    03/29/13  2:57 AM      Result Value Range   Troponin I <0.30  <0.30 ng/mL  GLUCOSE, CAPILLARY     Status: Abnormal   Collection Time    03/29/13  7:25  AM      Result Value Range   Glucose-Capillary 156 (*) 70 - 99 mg/dL    Intake/Output Summary (Last 24 hours) at 03/29/13 0921 Last data filed at 03/29/13 0839  Gross per 24 hour  Intake   1320 ml  Output   2850 ml  Net  -1530 ml    ASSESSMENT AND PLAN:  CHEST PAIN:  Nonobstructive CAD.  No further cardiac work up.  Needs continued primary risk reduction.  Please call with further questions.   PVCs:  No guideline indication for continued telemetry.  Consider discontinuing tele.    Fayrene FearingJames [facility] Cent[facility]chrein 03/29/2013 9:21 AM

## 2013-03-29 NOTE — Progress Notes (Signed)
SATURATION QUALIFICATIONS: (This note is used to comply with regulatory documentation for home oxygen)  Patient Saturations on Room Air at Rest = 88%  Patient Saturations on Room Air while Ambulating = 79%  Patient Saturations on 3 Liters of oxygen while Ambulating = 96%  Please briefly explain why patient needs home oxygen:

## 2013-03-29 NOTE — Progress Notes (Signed)
   CARE MANAGEMENT NOTE 03/29/2013  Patient:  [name], [name]   Account Number:  [id number]  Date Initiated:  03/25/2013  Documentation initiated by:  HUTCHINSON,CRYSTAL  Subjective/Objective Assessment:   Admitted with flu like s/s, CHF, COPD and fever associated with hypoxic respiratory failure and cough.     Action/Plan:   CM will monitor for disposition needs   Anticipated DC Date:  03/28/2013   Anticipated DC Plan:           Choice offered to / List presented to:     DME arranged  NEBULIZER MACHINE  [facility]      DME agency  [place].        Status of service:  Completed, signed off Medicare Important Message given?   (If response is "NO", the following Medicare IM given date fields will be blank) Date Medicare IM given:   Date Additional Medicare IM given:    Discharge Disposition:  HOME/SELF CARE  Per UR Regulation:  Reviewed for med. necessity/level of care/duration of stay  If discussed at [place] of Stay Meetings, dates discussed:    Comments:  CM called DMe for delivery of 02 and neb machine to be delivered to room prior to discharge.  No other CM needs were communicated.  Mariane Masters, BSN, [place] [phone].  03/29/13 03/28/2013 Left Heart Cath, Selective Coronary Angiography, LV angiography 03/28/2013 Medicare and VA coverage only. CM met with patient, wife and family. Patient receives care at [facility], [place], [place],  [facility]. No HHS prior to this admission No local PCP however, patient states he received letter from [facility] indicating approval for local PCP. Medications obtained through [facility]  via mail [place] on Oxygen but none prior to admission Transportation to appts:  Wife and children PRN. Last UR 03/25/2013 CM notified [facility] of IP admission CM has provided contact # for assistance with locating a PCP. Disposition:  pending: Home O2 needs:  pending Crystal Hutchinson RN, BSN, MSHL, CCM 03/28/2012   03/25/2013 From Home On IV  ABX Disposition: pending. Crystal Hutchinson RN, BSN, MSHL, CCM 03/25/2013 (Unit [phone])

## 2013-03-29 NOTE — Discharge Instructions (Signed)

## 2013-03-29 NOTE — Progress Notes (Signed)
The patient became SOB this morning right before respiratory gave him a breathing treatment, but the patient's O2 sats remained in the upper 90s on 4L of oxygen.  The nebulizer treatments seem to greatly relieve the patient's wheezing and SOB.  This morning, the MD stated that she wanted the patient walked to see if he qualified for home O2 and that the patient may be discharged home today.  This information was relayed to the day shift nurse.

## 2013-03-29 NOTE — Progress Notes (Signed)
D/C Tele, D/C IV, D/C instructions reviewed with pt. And pt.'s wife and daughter. Pt. And pt.'s family verbalized understanding. Prescriptions given to pt. Pt. Showed no signs or symptoms of distress and left unit with his O2 at 2L of oxygen and transported to car via Wheelchair and transported home via his daughter.

## 2013-03-29 NOTE — Progress Notes (Signed)
Pt. Was noted to have a 6 beat run of VTach, I notified MD of the episode and asked if pt. Would still be going home today and MD stated pt. Would still go home because pt. Was asymptomatic during the episode and is known for frequent PVC's.

## 2014-03-05 ENCOUNTER — Encounter (HOSPITAL_COMMUNITY): Payer: Self-pay | Admitting: Cardiology

## 2014-10-28 ENCOUNTER — Emergency Department (HOSPITAL_COMMUNITY): Payer: Medicare Other

## 2014-10-28 ENCOUNTER — Observation Stay (HOSPITAL_COMMUNITY)
Admission: EM | Admit: 2014-10-28 | Discharge: 2014-10-29 | Disposition: A | Discharge status: Home / Self Care | Payer: Medicare Other | Attending: Internal Medicine | Admitting: Internal Medicine

## 2014-10-28 ENCOUNTER — Encounter (HOSPITAL_COMMUNITY): Payer: Self-pay | Admitting: Neurology

## 2014-10-28 DIAGNOSIS — I10 Essential (primary) hypertension: Secondary | ICD-10-CM | POA: Insufficient documentation

## 2014-10-28 DIAGNOSIS — Z79899 Other long term (current) drug therapy: Secondary | ICD-10-CM | POA: Insufficient documentation

## 2014-10-28 DIAGNOSIS — I4891 Unspecified atrial fibrillation: Secondary | ICD-10-CM | POA: Diagnosis not present

## 2014-10-28 DIAGNOSIS — E039 Hypothyroidism, unspecified: Secondary | ICD-10-CM | POA: Insufficient documentation

## 2014-10-28 DIAGNOSIS — I77811 Abdominal aortic ectasia: Secondary | ICD-10-CM | POA: Diagnosis not present

## 2014-10-28 DIAGNOSIS — Z7982 Long term (current) use of aspirin: Secondary | ICD-10-CM | POA: Diagnosis not present

## 2014-10-28 DIAGNOSIS — Z8546 Personal history of malignant neoplasm of prostate: Secondary | ICD-10-CM | POA: Insufficient documentation

## 2014-10-28 DIAGNOSIS — E785 Hyperlipidemia, unspecified: Secondary | ICD-10-CM | POA: Insufficient documentation

## 2014-10-28 DIAGNOSIS — I998 Other disorder of circulatory system: Secondary | ICD-10-CM | POA: Insufficient documentation

## 2014-10-28 DIAGNOSIS — Z87891 Personal history of nicotine dependence: Secondary | ICD-10-CM | POA: Diagnosis not present

## 2014-10-28 DIAGNOSIS — R05 Cough: Secondary | ICD-10-CM

## 2014-10-28 DIAGNOSIS — J208 Acute bronchitis due to other specified organisms: Secondary | ICD-10-CM

## 2014-10-28 DIAGNOSIS — E119 Type 2 diabetes mellitus without complications: Secondary | ICD-10-CM | POA: Insufficient documentation

## 2014-10-28 DIAGNOSIS — A084 Viral intestinal infection, unspecified: Secondary | ICD-10-CM | POA: Diagnosis not present

## 2014-10-28 DIAGNOSIS — K219 Gastro-esophageal reflux disease without esophagitis: Secondary | ICD-10-CM | POA: Diagnosis not present

## 2014-10-28 DIAGNOSIS — Z923 Personal history of irradiation: Secondary | ICD-10-CM | POA: Diagnosis not present

## 2014-10-28 DIAGNOSIS — R509 Fever, unspecified: Secondary | ICD-10-CM

## 2014-10-28 DIAGNOSIS — N400 Enlarged prostate without lower urinary tract symptoms: Secondary | ICD-10-CM | POA: Diagnosis not present

## 2014-10-28 DIAGNOSIS — I4510 Unspecified right bundle-branch block: Secondary | ICD-10-CM | POA: Diagnosis not present

## 2014-10-28 DIAGNOSIS — Z8673 Personal history of transient ischemic attack (TIA), and cerebral infarction without residual deficits: Secondary | ICD-10-CM | POA: Insufficient documentation

## 2014-10-28 DIAGNOSIS — J471 Bronchiectasis with (acute) exacerbation: Secondary | ICD-10-CM | POA: Insufficient documentation

## 2014-10-28 DIAGNOSIS — H9193 Unspecified hearing loss, bilateral: Secondary | ICD-10-CM | POA: Diagnosis not present

## 2014-10-28 DIAGNOSIS — R4182 Altered mental status, unspecified: Secondary | ICD-10-CM | POA: Diagnosis not present

## 2014-10-28 DIAGNOSIS — J449 Chronic obstructive pulmonary disease, unspecified: Secondary | ICD-10-CM | POA: Insufficient documentation

## 2014-10-28 DIAGNOSIS — R1030 Lower abdominal pain, unspecified: Secondary | ICD-10-CM | POA: Diagnosis present

## 2014-10-28 DIAGNOSIS — I81 Portal vein thrombosis: Secondary | ICD-10-CM

## 2014-10-28 DIAGNOSIS — R059 Cough, unspecified: Secondary | ICD-10-CM

## 2014-10-28 LAB — I-STAT CG4 LACTIC ACID, ED
Lactic Acid, Venous: 1.14 mmol/L (ref 0.5–2.0)
Lactic Acid, Venous: 0.66 mmol/L (ref 0.5–2.0)

## 2014-10-28 LAB — COMPREHENSIVE METABOLIC PANEL
ALK PHOS: 35 U/L — AB (ref 38–126)
ALT: 14 U/L — ABNORMAL LOW (ref 17–63)
AST: 18 U/L (ref 15–41)
Albumin: 3.5 g/dL (ref 3.5–5.0)
Anion gap: 11 (ref 5–15)
BILIRUBIN TOTAL: 0.7 mg/dL (ref 0.3–1.2)
BUN: 12 mg/dL (ref 6–20)
CO2: 23 mmol/L (ref 22–32)
CREATININE: 0.91 mg/dL (ref 0.61–1.24)
Calcium: 8.9 mg/dL (ref 8.9–10.3)
Chloride: 103 mmol/L (ref 101–111)
GFR calc Af Amer: >60 mL/min (ref >60)
GLUCOSE: 105 mg/dL — AB (ref 65–99)
POTASSIUM: 3.7 mmol/L (ref 3.5–5.1)
SODIUM: 137 mmol/L (ref 135–145)
TOTAL PROTEIN: 6.1 g/dL — AB (ref 6.5–8.1)

## 2014-10-28 LAB — URINE MICROSCOPIC-ADD ON

## 2014-10-28 LAB — I-STAT TROPONIN, ED: Troponin i, poc: 0.02 ng/mL (ref 0.00–0.08)

## 2014-10-28 LAB — URINALYSIS, ROUTINE W REFLEX MICROSCOPIC
Bilirubin Urine: NEGATIVE
Glucose, UA: NEGATIVE mg/dL
Hgb urine dipstick: NEGATIVE
KETONES UR: NEGATIVE mg/dL
LEUKOCYTES UA: NEGATIVE
NITRITE: NEGATIVE
Protein, ur: 30 mg/dL — AB
SPECIFIC GRAVITY, URINE: 1.019 (ref 1.005–1.030)
UROBILINOGEN UA: 1 mg/dL (ref 0.0–1.0)
pH: 6 (ref 5.0–8.0)

## 2014-10-28 LAB — GLUCOSE, CAPILLARY: Glucose-Capillary: 152 mg/dL — ABNORMAL HIGH (ref 65–99)

## 2014-10-28 LAB — CBC
HEMATOCRIT: 40.2 % (ref 39.0–52.0)
HEMOGLOBIN: 13.4 g/dL (ref 13.0–17.0)
MCH: 28.8 pg (ref 26.0–34.0)
MCHC: 33.3 g/dL (ref 30.0–36.0)
MCV: 86.3 fL (ref 78.0–100.0)
PLATELETS: 135 10*3/uL — AB (ref 150–400)
RBC: 4.66 MIL/uL (ref 4.22–5.81)
RDW: 13.9 % (ref 11.5–15.5)
WBC: 5.4 10*3/uL (ref 4.0–10.5)

## 2014-10-28 LAB — LIPASE, BLOOD: Lipase: 15 U/L — ABNORMAL LOW (ref 22–51)

## 2014-10-28 MED ORDER — ENOXAPARIN SODIUM 40 MG/0.4ML ~~LOC~~ SOLN
40.0000 mg | SUBCUTANEOUS | Status: DC
  Administered 2014-10-29: 40 mg via SUBCUTANEOUS
  Filled 2014-10-28: qty 0.4

## 2014-10-28 MED ORDER — DEXTROSE 5 % IV SOLN
1.0000 g | Freq: Once | INTRAVENOUS | Status: AC
  Administered 2014-10-28: 1 g via INTRAVENOUS
  Filled 2014-10-28: qty 10

## 2014-10-28 MED ORDER — AMLODIPINE BESYLATE 2.5 MG PO TABS
2.5000 mg | ORAL_TABLET | Freq: Every day | ORAL | Status: DC
  Administered 2014-10-29: 2.5 mg via ORAL
  Filled 2014-10-28: qty 1

## 2014-10-28 MED ORDER — IPRATROPIUM-ALBUTEROL 0.5-2.5 (3) MG/3ML IN SOLN: 3.0000 mL | RESPIRATORY_TRACT | Status: DC | PRN

## 2014-10-28 MED ORDER — INSULIN ASPART 100 UNIT/ML ~~LOC~~ SOLN
0.0000 [IU] | Freq: Every day | SUBCUTANEOUS | Status: DC
Start: 2014-10-28 — End: 2014-10-29

## 2014-10-28 MED ORDER — SODIUM CHLORIDE 0.9 % IV BOLUS (SEPSIS)
1000.0000 mL | Freq: Once | INTRAVENOUS | Status: AC
Start: 2014-10-28 — End: 2014-10-28
  Administered 2014-10-28: 1000 mL via INTRAVENOUS

## 2014-10-28 MED ORDER — ACETAMINOPHEN 325 MG PO TABS: 650.0000 mg | ORAL_TABLET | Freq: Four times a day (QID) | ORAL | Status: DC | PRN

## 2014-10-28 MED ORDER — DEXTROSE 5 % IV SOLN
500.0000 mg | Freq: Once | INTRAVENOUS | Status: AC
  Administered 2014-10-28: 500 mg via INTRAVENOUS
  Filled 2014-10-28: qty 500

## 2014-10-28 MED ORDER — ASPIRIN EC 81 MG PO TBEC
81.0000 mg | DELAYED_RELEASE_TABLET | Freq: Every day | ORAL | Status: DC
  Administered 2014-10-29: 81 mg via ORAL
  Filled 2014-10-28: qty 1

## 2014-10-28 MED ORDER — LEVOTHYROXINE SODIUM 25 MCG PO TABS
25.0000 ug | ORAL_TABLET | Freq: Every day | ORAL | Status: DC
  Administered 2014-10-29: 25 ug via ORAL
  Filled 2014-10-28: qty 1

## 2014-10-28 MED ORDER — PRAVASTATIN SODIUM 40 MG PO TABS: 40.0000 mg | ORAL_TABLET | Freq: Every day | ORAL | Status: DC

## 2014-10-28 MED ORDER — LATANOPROST 0.005 % OP SOLN
1.0000 [drp] | Freq: Every day | OPHTHALMIC | Status: DC
  Administered 2014-10-28: 1 [drp] via OPHTHALMIC
  Filled 2014-10-28: qty 2.5

## 2014-10-28 MED ORDER — IOHEXOL 300 MG/ML  SOLN
25.0000 mL | Freq: Once | INTRAMUSCULAR | Status: AC | PRN
  Administered 2014-10-28: 25 mL via ORAL

## 2014-10-28 MED ORDER — SODIUM CHLORIDE 0.9 % IV SOLN
INTRAVENOUS | Status: AC
Start: 2014-10-28 — End: 2014-10-29
  Administered 2014-10-28 – 2014-10-29 (×2): via INTRAVENOUS

## 2014-10-28 MED ORDER — FINASTERIDE 5 MG PO TABS
5.0000 mg | ORAL_TABLET | Freq: Every day | ORAL | Status: DC
Start: 2014-10-29 — End: 2014-10-29
  Administered 2014-10-29: 5 mg via ORAL
  Filled 2014-10-28: qty 1

## 2014-10-28 MED ORDER — INSULIN ASPART 100 UNIT/ML ~~LOC~~ SOLN: 0.0000 [IU] | Freq: Three times a day (TID) | SUBCUTANEOUS | Status: DC

## 2014-10-28 MED ORDER — PANTOPRAZOLE SODIUM 40 MG PO TBEC
40.0000 mg | DELAYED_RELEASE_TABLET | Freq: Every day | ORAL | Status: DC
  Administered 2014-10-29: 40 mg via ORAL
  Filled 2014-10-28: qty 1

## 2014-10-28 MED ORDER — LISINOPRIL 20 MG PO TABS
40.0000 mg | ORAL_TABLET | Freq: Every day | ORAL | Status: DC
  Administered 2014-10-29: 40 mg via ORAL
  Filled 2014-10-28: qty 2

## 2014-10-28 MED ORDER — TERAZOSIN HCL 5 MG PO CAPS
10.0000 mg | ORAL_CAPSULE | Freq: Every day | ORAL | Status: DC
  Administered 2014-10-28: 10 mg via ORAL
  Filled 2014-10-28 (×2): qty 2

## 2014-10-28 MED ORDER — IOHEXOL 300 MG/ML  SOLN
100.0000 mL | Freq: Once | INTRAMUSCULAR | Status: AC | PRN
  Administered 2014-10-28: 100 mL via INTRAVENOUS

## 2014-10-28 MED ORDER — ACETAMINOPHEN 325 MG PO TABS
650.0000 mg | ORAL_TABLET | Freq: Once | ORAL | Status: AC
  Administered 2014-10-28: 650 mg via ORAL
  Filled 2014-10-28: qty 2

## 2014-10-28 NOTE — ED Notes (Signed)
Per ems- pt comes from home, family called ems due to patient feeling drowsy and tired today which is different from baseline. Pt reports mid lower abd pain since yesterday with dark urine. Has congested cough but he has had for several months. Pt is disoriented to year and month, follows commands. CBG 98, BP 152/90, HR 66 RBBB.

## 2014-10-28 NOTE — H&P (Signed)
Date: 10/29/2014               Patient Name:  [name] MRN: [id number]  DOB: [birth year] Age / Sex: 81 y.o., male   PCP: Pcp Not In System         Medical Service: Internal Medicine Teaching Service         Attending Physician: Dr. Doneen Poisson, MD    First Contact: Dr. Selina Cooley Pager: [phone]  Second Contact: Dr. Evelena Peat Pager: [phone]       After Hours (After 5p/  First Contact Pager: [phone]  weekends / holidays): Second Contact Pager: [phone]   Chief Complaint: Fever   History of Present Illness: [name] is a 81 y.o. caucasian male with past medical history of COPD, hypertension, diabetes mellitus, hypothyroidism, hyperlipidemia, CVA (May 2012) who presents to the emergency department with fever and associated chills, cough, and abdominal pain.  Patient states that he has had intermittent cough for approximately 2 weeks that is productive of yellow-colored sputum.  Also reports having abdominal pain during this period of time.  Starting yesterday, patient reported having shaking chills, subjective fever, and one episode of watery, non-bloody diarrhea this afternoon, thus prompting him to come to the emergency department.  Patient denies any nausea, vomiting, sick contacts, weight loss, dysuria, chest pain.  Patient does use home oxygen as needed for occasional shortness of breath related to exertion.  During the encounter he maintained O2 sats of 96% on room air.  [facility] [place] [facility] last [facility] for routine follow-up and was told that everything on his labs looked good.  Meds: Current Facility-Administered Medications  Medication Dose Route Frequency Provider Last Rate Last Dose  . 0.9 %  sodium chloride infusion   Intravenous Continuous Courtney Paris, MD 125 mL/hr at 10/28/14 2321    . acetaminophen (TYLENOL) tablet 650 mg  650 mg Oral Q6H PRN Courtney Paris, MD      . amLODipine (NORVASC) tablet 2.5 mg  2.5 mg  Oral Daily Courtney Paris, MD      . aspirin EC tablet 81 mg  81 mg Oral Daily Courtney Paris, MD      . enoxaparin (LOVENOX) injection 40 mg  40 mg Subcutaneous Q24H Courtney Paris, MD      . finasteride (PROSCAR) tablet 5 mg  5 mg Oral Daily Courtney Paris, MD      . insulin aspart (novoLOG) injection 0-5 Units  0-5 Units Subcutaneous QHS Courtney Paris, MD   0 Units at 10/28/14 2230  . insulin aspart (novoLOG) injection 0-9 Units  0-9 Units Subcutaneous TID WC Courtney Paris, MD      . ipratropium-albuterol (DUONEB) 0.5-2.5 (3) MG/3ML nebulizer solution 3 mL  3 mL Nebulization Q4H PRN Courtney Paris, MD      . latanoprost (XALATAN) 0.005 % ophthalmic solution 1 drop  1 drop Both Eyes QHS Courtney Paris, MD   1 drop at 10/28/14 2330  . levothyroxine (SYNTHROID, LEVOTHROID) tablet 25 mcg  25 mcg Oral QAC breakfast Courtney Paris, MD      . lisinopril (PRINIVIL,ZESTRIL) tablet 40 mg  40 mg Oral Daily Courtney Paris, MD      . pantoprazole (PROTONIX) EC tablet 40 mg  40 mg Oral Daily Courtney Paris, MD      . pravastatin (PRAVACHOL) tablet 40 mg  40 mg Oral  [id number] Courtney Paris, MD      . terazosin (HYTRIN) capsule 10 mg  10 mg Oral QHS Courtney Paris, MD   10 mg at 10/28/14 2330    Allergies: Allergies as of 10/28/2014  . (No Known Allergies)   Past Medical History  Diagnosis Date  . CVA (cerebral infarction)   . COPD (chronic obstructive pulmonary disease)   . Essential hypertension, benign   . Diabetes mellitus without complication   . Atrial fibrillation   . Hard of hearing    Past Surgical History  Procedure Laterality Date  . Hernia repair    . Left heart catheterization with coronary angiogram N/A 03/28/2013    Procedure: LEFT HEART CATHETERIZATION WITH CORONARY ANGIOGRAM;  Surgeon: Peter M Swaziland, MD;  Location[facility] C[facility] LAB;  Service: Cardiovascular;  Laterality: N/A;   No family history on file. History   Social History  . Marital Status: Married    Spouse Name: N/A  . Number of Children: N/A  .  Years of Education: N/A   Occupational History  . Not on file.   Social History Main Topics  . Smoking status: Former Smoker    Types: Cigarettes  . Smokeless tobacco: Former Neurosurgeon  . Alcohol Use: No  . Drug Use: No  . Sexual Activity: Not on file   Other Topics Concern  . Not on file   Social History Narrative    Review of Systems:  General: Denies diaphoresis, appetite change, and fatigue.  Reports fever.  Respiratory: Reports occasional SOB and cough.  Cardiovascular: Denies chest pain and palpitations.  Gastrointestinal: Denies nausea, vomiting.  Reports abdominal pain, and diarrhea Musculoskeletal: Denies myalgias, arthralgias, back pain, and gait problem.  Neurological: Denies dizziness, syncope, weakness, lightheadedness, and headaches.  Psychiatric/Behavioral: Denies mood changes, sleep disturbance, and agitation.  Physical Exam: Blood pressure 123/46, pulse 64, temperature 100.2 F (37.9 C), temperature source Oral, resp. rate 20, height 5\' 8"  (1.727 m), weight 164 lb 6.4 oz (74.571 kg), SpO2 94 %. General: Patient is a well-developed and well-nourished, in no acute distress and cooperative with exam, and lying in bed. Head: Normocephalic and atraumatic. Eyes: No scleral icterus.  Neck: No JVD Cardiovascular: RRR, S1 normal, S2 normal, no murmurs, gallops, or rubs. Pulmonary/Chest: Air entry equal bilaterally, no wheezes, rales, or rhonchi. Abdominal: Soft, non-tender, non-distended, BS + Extremities: No swelling or edema,  pulses symmetric and intact bilaterally. No cyanosis or clubbing. Neurological: Alert, cranial nerves grossly intact. Skin: Warm, dry and intact. No rashes or erythema. Psychiatric: Normal mood and affect. speech and behavior is normal.   Lab results: Basic Metabolic Panel:  Recent Labs  60/45/40 1510  NA 137  K 3.7  CL 103  CO2 23  GLUCOSE 105*  BUN 12  CREATININE 0.91  CALCIUM 8.9   Liver Function Tests:  Recent Labs   10/28/14 1510  AST 18  ALT 14*  ALKPHOS 35*  BILITOT 0.7  PROT 6.1*  ALBUMIN 3.5    Recent Labs  10/28/14 1510  LIPASE 15*   CBC:  Recent Labs  10/28/14 1510  WBC 5.4  HGB 13.4  HCT 40.2  MCV 86.3  PLT 135*   CBG:  Recent Labs  10/28/14 2239  GLUCAP 152*   Urinalysis:  Recent Labs  10/28/14 1628  COLORURINE AMBER*  LABSPEC 1.019  PHURINE 6.0  GLUCOSEU NEGATIVE  HGBUR NEGATIVE  BILIRUBINUR NEGATIVE  KETONESUR NEGATIVE  PROTEINUR 30*  UROBILINOGEN 1.0  NITRITE NEGATIVE  LEUKOCYTESUR NEGATIVE  Imaging results:  Ct Abdomen Pelvis W Contrast  10/28/2014   CLINICAL DATA:  Patient with lower abdominal pain for 2- 3 weeks. Constipation.  EXAM: CT ABDOMEN AND PELVIS WITH CONTRAST  TECHNIQUE: Multidetector CT imaging of the abdomen and pelvis was performed using the standard protocol following bolus administration of intravenous contrast.  CONTRAST:  OMNIPAQUE IOHEXOL 300 MG/ML  SOLN  COMPARISON:  CT abdomen pelvis 03/06/2012  FINDINGS: Lower chest: Normal heart size. Dependent atelectasis within the bilateral lower lobes.  Hepatobiliary: There is abnormal profusion to the left hepatic lobe surrounding a tubular low-attenuation structure which is nonspecific however may potentially represent a chronically thrombosed branch of the portal vein. Gallbladder is unremarkable. No intrahepatic or extrahepatic biliary ductal dilatation. Stable low-attenuation hepatic lesion left hepatic lobe.  Pancreas: Unremarkable  Spleen: Unremarkable  Adrenals/Urinary Tract: Normal adrenal glands. Kidneys enhance symmetrically with contrast. Stable nonspecific perinephric fat stranding. Urinary bladder is unremarkable.  Stomach/Bowel: Sigmoid colonic diverticulosis. No CT evidence to suggest acute diverticulitis. No evidence for bowel obstruction. Hiatal hernia.  Vascular/Lymphatic: Peripheral calcified atherosclerotic plaque involving abdominal aorta. Infrarenal abdominal aortic ectasia  measuring 2.8 cm, previously 2.5 cm.  Other: Small bowel containing left inguinal hernia. Prostate enlarged with central dystrophic calcifications.  Musculoskeletal: No aggressive or acute appearing osseous lesions. Lower lumbar spine degenerative changes.  IMPRESSION: Abnormal profusion to left hepatic lobe is nonspecific however may be secondary to thrombosed branch of the portal vein. Consider further evaluation of the liver with dedicated hepatic MRI.  Sigmoid colonic diverticulosis without evidence for acute diverticulitis.  Small bowel containing left inguinal hernia. No evidence for obstruction.  Interval increase infrarenal abdominal aortic ectasia measuring 2.8 cm. Ectatic abdominal aorta at risk for aneurysm development. Recommend followup by ultrasound in 5 years. This recommendation follows ACR consensus guidelines: White Paper of the ACR Incidental Findings Committee II on Vascular Findings. J Am Coll Radiol 2013; 10:789-794.   Electronically Signed   By: Annia Belt M.D.   On: 10/28/2014 20:36   Dg Chest Portable 1 View  10/28/2014   CLINICAL DATA:  Altered mental status, hypertension, diabetes mellitus, COPD, CVA, atrial fibrillation, former smoker  EXAM: PORTABLE CHEST - 1 VIEW  COMPARISON:  Portable exam 1440 hours compared 03/25/2013  FINDINGS: Normal heart size, mediastinal contours and pulmonary vascularity for technique.  Mild chronic central peribronchial thickening.  Minimal RIGHT basilar atelectasis and slight chronic accentuation of interstitial markings unchanged.  No acute infiltrate, pleural effusion or pneumothorax.  Bones unremarkable.  IMPRESSION: Chronic bronchitic changes and RIGHT basilar atelectasis.  No acute abnormalities.   Electronically Signed   By: Ulyses Southward M.D.   On: 10/28/2014 14:55    Assessment & Plan by Problem: 81 y.o. caucasian male with past medical history of COPD, hypertension, diabetes mellitus, hypothyroidism, hyperlipidemia, CVA presenting with fever  secondary to 2 weeks of cough, abdominal pain, concern for viral bronchitis and gastroenteritis.  Fever (Tmax 101.5 rectally) -2 weeks of viral prodrome with cough, generalized abdominal pain followed by one day of fever and one episode of watery, non-bloody diarrhea today -1/4 SIRS criteria (fever >100.4) with no identifiable source of infection -symptoms less likely to have bacterial etiology given normal WBC, chest xray without evidence of pneumonia, CT abdomen without evidence of diverticulitis or colitis, urinalysis without nitrites or leukocytes, no obvious cellulitis or rashes -blood cultures x 2 obtained in the ED and patient received ceftriaxone 1g IV and azithromycin 500mg  IV. -NS IV fluids 175mL/hr -acetaminophen 650 mg PO q6h prn  Portal vein thrombosis -CT abdomen shows "abnormal profusion to left hepatic lobe is nonspecific however may be secondary to thrombosed branch of the portal vein. Consider further evaluation of the liver with dedicated hepatic MRI".   -spoke to radiology.  They feel this is most likely chronic but did show some increased attenuation when compared to prior studies.   -LFTs normal -consider outpatient MRI if deemed to be clinically warranted  Hypertension -home meds of amlodipine 2.5mg  daily, lisinopril 40mg  daily. -continue home meds.  Hyperlipidemia  -pravastatin 40mg  daily  Hypothyroidism -Synthroid daily -TSH in AM  Diabetes mellitus -CBG of 152 on admission -on metformin at home. 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 Anticipated discharge in approximately 1 day(s).   The patient does have a current PCP (Pcp Not In System) and does not need an [facility] hospital follow-up appointment after discharge.  The patient does not have transportation limitations that hinder transportation to clinic appointments.  Signed: Gwynn Burly, DO 10/29/2014, 3:52 AM

## 2014-10-28 NOTE — ED Notes (Signed)
Pt finished oral contrast.

## 2014-10-28 NOTE — ED Provider Notes (Signed)
CSN: [id number]     Arrival date & time 10/28/14  1417 History   First MD Initiated Contact with Patient 10/28/14 1424     Chief Complaint  Patient presents with  . Abdominal Pain     (Consider location/radiation/quality/duration/timing/severity/associated sxs/prior Treatment) HPI Comments: 81 year old male with COPD, prostate hyperplasia, high blood pressure, angina presents with mild confusion, chills, cough and suprapubic discomfort. Patient has had intermittent cough over a month however his had worsening cough and chills starting yesterday. Patient is also had intermittent suprapubic discomfort since yesterday. No current antibodies no recent hospitalization. No chest pain. Symptoms intermittent gradual worsening past smoker.  Patient is a 81 y.o. male presenting with abdominal pain. The history is provided by the patient.  Abdominal Pain Associated symptoms: cough and nausea   Associated symptoms: no chest pain, no chills, no dysuria, no fever, no shortness of breath and no vomiting     Past Medical History  Diagnosis Date  . CVA (cerebral infarction)   . COPD (chronic obstructive pulmonary disease)   . Essential hypertension, benign   . Diabetes mellitus without complication   . Atrial fibrillation   . Hard of hearing    Past Surgical History  Procedure Laterality Date  . Hernia repair    . Left heart catheterization with coronary angiogram N/A 03/28/2013    Procedure: LEFT HEART CATHETERIZATION WITH CORONARY ANGIOGRAM;  Surgeon: Peter M Swaziland, MD;  Location: [facility] CATH LAB;  Service: Cardiovascular;  Laterality: N/A;   No family history on file. History  Substance Use Topics  . Smoking status: Former Smoker    Types: Cigarettes  . Smokeless tobacco: Former Neurosurgeon  . Alcohol Use: No    Review of Systems  Constitutional: Negative for fever and chills.  HENT: Negative for congestion.   Eyes: Negative for visual disturbance.  Respiratory: Positive for cough. Negative for  shortness of breath.   Cardiovascular: Negative for chest pain.  Gastrointestinal: Positive for nausea and abdominal pain. Negative for vomiting.  Genitourinary: Negative for dysuria and flank pain.  Musculoskeletal: Negative for back pain, neck pain and neck stiffness.  Skin: Negative for rash.  Neurological: Negative for light-headedness and headaches.  Psychiatric/Behavioral: Positive for confusion.      Allergies  Review of patient's allergies indicates no known allergies.  Home Medications   Prior to Admission medications   Medication Sig Start Date End Date Taking? Authorizing Provider  amLODipine (NORVASC) 5 MG tablet Take 2.5 mg by mouth daily.   Yes Historical Provider, MD  aspirin EC 81 MG EC tablet Take 1 tablet (81 mg total) by mouth daily. 03/29/13  Yes Maryann Mikhail, DO  budesonide-formoterol (SYMBICORT) 80-4.5 MCG/ACT inhaler Inhale 2 puffs into the lungs 2 (two) times daily.   Yes Historical Provider, MD  finasteride (PROSCAR) 5 MG tablet Take 5 mg by mouth daily.   Yes Historical Provider, MD  hydrochlorothiazide (HYDRODIURIL) 25 MG tablet Take 12.5 mg by mouth daily.   Yes Historical Provider, MD  Ipratropium-Albuterol (COMBIVENT RESPIMAT) 20-100 MCG/ACT AERS respimat Inhale 1 puff into the lungs every 6 (six) hours as needed for wheezing.    Yes Historical Provider, MD  ketotifen (ZADITOR) 0.025 % ophthalmic solution Place 2 drops into both eyes 2 (two) times daily.   Yes Historical Provider, MD  latanoprost (XALATAN) 0.005 % ophthalmic solution Place 1 drop into both eyes at bedtime.   Yes Historical Provider, MD  levothyroxine (SYNTHROID, LEVOTHROID) 25 MCG tablet Take 25 mcg by mouth daily before breakfast.  Yes Historical Provider, MD  lisinopril (PRINIVIL,ZESTRIL) 40 MG tablet Take 40 mg by mouth daily.   Yes Historical Provider, MD  metFORMIN (GLUCOPHAGE) 500 MG tablet Take 500 mg by mouth 2 (two) times daily with a meal.    Yes Historical Provider, MD   metoprolol (LOPRESSOR) 50 MG tablet Take 25 mg by mouth 2 (two) times daily.   Yes Historical Provider, MD  Multiple Vitamins-Minerals (MULTIVITAMIN WITH MINERALS) tablet Take 1 tablet by mouth daily.   Yes Historical Provider, MD  omeprazole (PRILOSEC) 20 MG capsule Take 20 mg by mouth 2 (two) times daily before a meal.   Yes Historical Provider, MD  pravastatin (PRAVACHOL) 40 MG tablet Take 40 mg by mouth daily.   Yes Historical Provider, MD  terazosin (HYTRIN) 10 MG capsule Take 10 mg by mouth at bedtime.   Yes Historical Provider, MD   BP 137/45 mmHg  Pulse 66  Temp(Src) 98.5 F (36.9 C) (Oral)  Resp 20  Ht 5\' 8"  (1.727 m)  Wt 164 lb 6.4 oz (74.571 kg)  BMI 25.00 kg/m2  SpO2 97% Physical Exam  Constitutional: He is oriented to person, place, and time. He appears well-developed and well-nourished.  HENT:  Head: Normocephalic and atraumatic.  Dry mucous membranes  Eyes: Conjunctivae are normal. Right eye exhibits no discharge. Left eye exhibits no discharge.  Neck: Normal range of motion. Neck supple. No tracheal deviation present.  Cardiovascular: Normal rate and regular rhythm.   Pulmonary/Chest: Effort normal. He has rales (crackles at bases worse left rales).  Abdominal: Soft. He exhibits no distension. There is tenderness (mild suprapubic no peritonitis). There is no guarding.  Musculoskeletal: He exhibits no edema.  Neurological: He is alert and oriented to person, place, and time.  Patient moves all extremities equal bilateral, mild general weakness, pupils equal on the muscle function intact. Patient is very slight confusion with specific questions however no's where he is is wife answers questions mostly appropriately  Skin: Skin is warm. No rash noted.  Psychiatric: He has a normal mood and affect.  Nursing note and vitals reviewed.   ED Course  Procedures (including critical care time) Labs Review Labs Reviewed  LIPASE, BLOOD - Abnormal; Notable for the following:     Lipase 15 (*)    All other components within normal limits  COMPREHENSIVE METABOLIC PANEL - Abnormal; Notable for the following:    Glucose, Bld 105 (*)    Total Protein 6.1 (*)    ALT 14 (*)    Alkaline Phosphatase 35 (*)    All other components within normal limits  CBC - Abnormal; Notable for the following:    Platelets 135 (*)    All other components within normal limits  URINALYSIS, ROUTINE W REFLEX MICROSCOPIC (NOT AT [facility]) - Abnormal; Notable for the following:    Color, Urine AMBER (*)    Protein, ur 30 (*)    All other components within normal limits  GLUCOSE, CAPILLARY - Abnormal; Notable for the following:    Glucose-Capillary 152 (*)    All other components within normal limits  GLUCOSE, CAPILLARY - Abnormal; Notable for the following:    Glucose-Capillary 101 (*)    All other components within normal limits  GLUCOSE, CAPILLARY - Abnormal; Notable for the following:    Glucose-Capillary 118 (*)    All other components within normal limits  CULTURE, BLOOD (ROUTINE X 2)  CULTURE, BLOOD (ROUTINE X 2)  URINE MICROSCOPIC-ADD ON  TSH  I-STAT CG4 LACTIC ACID,  ED  Rosezena Sensor, ED  I-STAT CG4 LACTIC ACID, ED    Imaging Review Dg Chest 2 View  10/29/2014   CLINICAL DATA:  Productive cough, shortness of breath, fever, and abdominal pain; history of COPD, angina, and diabetes, remote history of tobacco use  EXAM: CHEST  2 VIEW  COMPARISON:  Chest x-ray of October 28, 2014  FINDINGS: The lungs are mildly hyperinflated. The interstitial markings are coarse bilaterally but have improved since the previous study. Stable coarse lung markings projecting over the lower thoracic spine on the right are consistent with scarring.The heart is normal in size. The pulmonary vascularity is not engorged. There is no pleural effusion.  IMPRESSION: COPD and chronic pulmonary fibrotic changes. There is no acute cardiopulmonary abnormality.   Electronically Signed   By: David  Swaziland M.D.   On:  10/29/2014 08:49     EKG Interpretation   Date/Time:  Wednesday October 28 2014 14:25:11 EDT Ventricular Rate:  68 PR Interval:  153 QRS Duration: 142 QT Interval:  414 QTC Calculation: 440 R Axis:   -85 Text Interpretation:  Sinus rhythm RBBB and LAFB ED PHYSICIAN  INTERPRETATION AVAILABLE IN [facility] Confirmed by TEST, Record  ([id number]) on 10/29/2014 6:32:16 AM      MDM   Final diagnoses:  Fever, unspecified fever cause  Portal vein thrombosis, possible  Lower abdominal pain   Patient presents with cough, suprapubic discomfort and fever concern for community acquired pneumonia and/or urinary infection. Antibodies, cultures, general blood work and plan for admission to the hospital.  Pt signed out to fup UA, if unremarkable plan for CT scan abdo and admission.  Filed Vitals:   10/29/14 0939  BP: 137/45  Pulse: 66  Temp: 98.5 F (36.9 C)  Resp: 20     Blane Ohara, MD 10/31/14 [phone]

## 2014-10-29 ENCOUNTER — Observation Stay (HOSPITAL_COMMUNITY): Payer: Medicare Other

## 2014-10-29 DIAGNOSIS — A084 Viral intestinal infection, unspecified: Secondary | ICD-10-CM | POA: Diagnosis not present

## 2014-10-29 DIAGNOSIS — R05 Cough: Secondary | ICD-10-CM

## 2014-10-29 DIAGNOSIS — J208 Acute bronchitis due to other specified organisms: Secondary | ICD-10-CM

## 2014-10-29 DIAGNOSIS — R509 Fever, unspecified: Secondary | ICD-10-CM

## 2014-10-29 DIAGNOSIS — I10 Essential (primary) hypertension: Secondary | ICD-10-CM

## 2014-10-29 DIAGNOSIS — I81 Portal vein thrombosis: Secondary | ICD-10-CM

## 2014-10-29 DIAGNOSIS — Z87891 Personal history of nicotine dependence: Secondary | ICD-10-CM

## 2014-10-29 DIAGNOSIS — N400 Enlarged prostate without lower urinary tract symptoms: Secondary | ICD-10-CM

## 2014-10-29 DIAGNOSIS — J449 Chronic obstructive pulmonary disease, unspecified: Secondary | ICD-10-CM

## 2014-10-29 DIAGNOSIS — Z951 Presence of aortocoronary bypass graft: Secondary | ICD-10-CM

## 2014-10-29 DIAGNOSIS — E039 Hypothyroidism, unspecified: Secondary | ICD-10-CM

## 2014-10-29 DIAGNOSIS — Z7982 Long term (current) use of aspirin: Secondary | ICD-10-CM

## 2014-10-29 DIAGNOSIS — Z8546 Personal history of malignant neoplasm of prostate: Secondary | ICD-10-CM

## 2014-10-29 DIAGNOSIS — Z8673 Personal history of transient ischemic attack (TIA), and cerebral infarction without residual deficits: Secondary | ICD-10-CM

## 2014-10-29 DIAGNOSIS — E785 Hyperlipidemia, unspecified: Secondary | ICD-10-CM

## 2014-10-29 DIAGNOSIS — E119 Type 2 diabetes mellitus without complications: Secondary | ICD-10-CM

## 2014-10-29 DIAGNOSIS — Z9981 Dependence on supplemental oxygen: Secondary | ICD-10-CM

## 2014-10-29 DIAGNOSIS — J471 Bronchiectasis with (acute) exacerbation: Secondary | ICD-10-CM | POA: Insufficient documentation

## 2014-10-29 LAB — GLUCOSE, CAPILLARY
GLUCOSE-CAPILLARY: 118 mg/dL — AB (ref 65–99)
Glucose-Capillary: 101 mg/dL — ABNORMAL HIGH (ref 65–99)

## 2014-10-29 LAB — TSH: TSH: 1.116 u[IU]/mL (ref 0.350–4.500)

## 2014-10-29 NOTE — Progress Notes (Signed)
Patient arrived to 4N02 from the [facility] ED at 2218, alert and oriented. Patient oriented to room and equipment. Patient is in no acute distress, MAEW. Temp 100. Will monitor closely overnight.

## 2014-10-29 NOTE — Discharge Instructions (Signed)
[name],  It was a pleasure to meet you and take care of you here at [facility].  You had a viral bug that caused your cough, diarrhea, and fever. We ran several tests that ruled out more serious problems. One of those tests was an abdominal CT scan that showed an abnormal area of your liver. The radiologist recommended getting a better picture of your liver with a different type of scan called an MRI. Your primary care doctor will talk to you more about making this decision.  Otherwise, drink lots of fluids and rest up. [facility] between the hours of 8 and 4 to schedule your follow-up appointment within the next 2 weeks.  Again, it was a pleasure to meet you and take care of you.  Take care, Dr. Earnest Conroy

## 2014-10-29 NOTE — Discharge Summary (Signed)
Name: [name] MRN: [id number] DOB: December 19, 1927 81 y.o. PCP: Pcp Not In System  Date of Admission: 10/28/2014  2:17 PM Date of Discharge: 10/29/2014 Attending Physician: Doneen Poisson, MD  Discharge Diagnosis: 1. Viral gastroenteritis 2. Viral bronchitis 3. Left hepatic lobe ischemia 4. Ectatic abdominal aorta  Discharge Medications:   Medication List    TAKE these medications        amLODipine 5 MG tablet  Commonly known as:  NORVASC  Take 2.5 mg by mouth daily.     aspirin 81 MG EC tablet  Take 1 tablet (81 mg total) by mouth daily.     budesonide-formoterol 80-4.5 MCG/ACT inhaler  Commonly known as:  SYMBICORT  Inhale 2 puffs into the lungs 2 (two) times daily.     COMBIVENT RESPIMAT 20-100 MCG/ACT Aers respimat  Generic drug:  Ipratropium-Albuterol  Inhale 1 puff into the lungs every 6 (six) hours as needed for wheezing.     finasteride 5 MG tablet  Commonly known as:  PROSCAR  Take 5 mg by mouth daily.     hydrochlorothiazide 25 MG tablet  Commonly known as:  HYDRODIURIL  Take 12.5 mg by mouth daily.     ketotifen 0.025 % ophthalmic solution  Commonly known as:  ZADITOR  Place 2 drops into both eyes 2 (two) times daily.     latanoprost 0.005 % ophthalmic solution  Commonly known as:  XALATAN  Place 1 drop into both eyes at bedtime.     levothyroxine 25 MCG tablet  Commonly known as:  SYNTHROID, LEVOTHROID  Take 25 mcg by mouth daily before breakfast.     lisinopril 40 MG tablet  Commonly known as:  PRINIVIL,ZESTRIL  Take 40 mg by mouth daily.     metFORMIN 500 MG tablet  Commonly known as:  GLUCOPHAGE  Take 500 mg by mouth 2 (two) times daily with a meal.     metoprolol 50 MG tablet  Commonly known as:  LOPRESSOR  Take 25 mg by mouth 2 (two) times daily.     multivitamin with minerals tablet  Take 1 tablet by mouth daily.     omeprazole 20 MG capsule  Commonly known as:  PRILOSEC  Take 20 mg by mouth 2 (two) times daily before a meal.      pravastatin 40 MG tablet  Commonly known as:  PRAVACHOL  Take 40 mg by mouth daily.     terazosin 10 MG capsule  Commonly known as:  HYTRIN  Take 10 mg by mouth at bedtime.        Disposition and follow-up:   [name] was discharged from [facility] in stable condition.  At the hospital follow up visit please address:  1.  Resolution of viral gastroenteritis and viral bronchitis  2. Hypoperfusion of left hepatic lobe incidentally found on abdominal CT  3.  Labs / imaging needed at time of follow-up: Abdominal MRI recommended by radiology  4.  Pending labs/ test needing follow-up: None.  Follow-up Appointments: [facility] [place] will contact Mr. Elting on 8/5 between the hours of 8 and 4 to schedule a follow-up appointment within 2 weeks.  Discharge Instructions: Discharge Instructions    (HEART FAILURE PATIENTS) Call MD:  Anytime you have any of the following symptoms: 1) 3 pound weight gain in 24 hours or 5 pounds in 1 week 2) shortness of breath, with or without a dry hacking cough 3) swelling in the hands, feet or stomach 4)  if you have to sleep on extra pillows at night in order to breathe.    Complete by:  As directed      Call MD for:  persistant dizziness or light-headedness    Complete by:  As directed      Call MD for:  persistant nausea and vomiting    Complete by:  As directed      Call MD for:  severe uncontrolled pain    Complete by:  As directed      Call MD for:  temperature >100.4    Complete by:  As directed      Diet - low sodium heart healthy    Complete by:  As directed      Increase activity slowly    Complete by:  As directed            Consultations: None.  Studies/Results: 10/28/2014: Abdominal CT: IMPRESSION: Abnormal profusion to left hepatic lobe is nonspecific however may be secondary to thrombosed branch of the portal vein. Consider further evaluation of the liver with dedicated hepatic MRI. Sigmoid colonic  diverticulosis without evidence for acute diverticulitis. Small bowel containing left inguinal hernia. No evidence for obstruction. Interval increase infrarenal abdominal aortic ectasia measuring 2.8 cm. Ectatic abdominal aorta at risk for aneurysm development. Recommend followup by ultrasound in 5 years. This recommendation follows ACR consensus guidelines: White Paper of the ACR Incidental Findings Committee II on Vascular Findings. J Am Coll Radiol 2013; 10:789-794. Electronically Signed By: Annia Belt M.D. On: 10/28/2014 20:36   10/29/2014 AP/Lateral CXR: FINDINGS: Normal heart size, mediastinal contours and pulmonary vascularity for technique. Mild chronic central peribronchial thickening. Minimal RIGHT basilar atelectasis and slight chronic accentuation of interstitial markings unchanged. No acute infiltrate, pleural effusion or pneumothorax. Bones unremarkable. IMPRESSION: Chronic bronchitic changes and RIGHT basilar atelectasis. No acute abnormalities. Electronically Signed By: Ulyses Southward M.D. On: 10/28/2014 14:55   Admission HPI: [name] is a 81 y.o. caucasian male with past medical history of COPD, hypertension, diabetes mellitus, hypothyroidism, hyperlipidemia, CVA (May 2012) who presents to the emergency department with fever and associated chills, cough, and abdominal pain. Patient states that he has had intermittent cough for approximately 2 weeks that is productive of yellow-colored sputum. Also reports having abdominal pain during this period of time. Starting yesterday, patient reported having shaking chills, subjective fever, and one episode of watery, non-bloody diarrhea this afternoon, thus prompting him to come to the emergency department. Patient denies any nausea, vomiting, sick contacts, weight loss, dysuria, chest pain. Patient does use home oxygen as needed for occasional shortness of breath related to exertion. During the encounter he  maintained O2 sats of 96% on room air. [facility] [place] [facility] there last week for routine follow-up and was told that everything on his labs looked good.  Hospital Course by problem list:  1. Viral Gastroenteritis: The patient presented with a fever 101.5 and diarrhea for two days.  Abdominal CT showed no acute process to explain his symptoms. No leukocytosis. His diarrhea improved overnight and he was afebrile by morning with  acetaminophen. Blood cultures NGTD x 1day.  2. Viral bronchitis: The patient had a 2 week history of productive cough before presenting to the emergency room.  He maintained oxygen sats of 96% on room air. His AP/lateral CXR was negative for consolidations, showing only chronic COPD changes with some bronchiectasis in the right lower lobe. Influenza A/B was negative by PCR. He clinically  improved overnight and remains afebrile. He has duonebs at home for chronic COPD but did not require an inhale while inpatient.  3. Left hepatic lobe ischemia: Incidentally noted on abdominal CT without changes in liver enzymes or RUQ pain.The radiologist recommend further evaluation with abdominal MRI.  4. Ectatic abdominal aorta: Also incidentally noted on abdominal CT, with recommendation for follow-up ultrasound in 5 years. He was hemodynamically stable and his abdominal exam was benign.  5. Benign prostatic hypertrophy: Stable during admission and his urinalysis was notable only for proteinuria, unchanged from December 2014. On prazosin and terazosin.   6. Diabetes: Well-controlled. His metformin was discontinued during his hospital stay.  7. Hypertension: Well-controlled. We continued his amlodipine, lisinopril, aspirin, and metoprolol.  8. Hypothyroidism: Well-controlled. TSH 1.116, on home dose of levothyroxine.  9. Hyperlipidemia: On home dose pravastatin  10. Gastroesophageal reflux disease: Well-controlled.On  pantoprazole.  Discharge Vitals:   BP 137/45 mmHg  Pulse 66  Temp(Src) 98.5 F (36.9 C) (Oral)  Resp 20  Ht 5\' 8"  (1.727 m)  Wt 74.571 kg (164 lb 6.4 oz)  BMI 25.00 kg/m2  SpO2 97%  Discharge Labs:  Results for orders placed or performed during the hospital encounter of 10/28/14 (from the past 24 hour(s))  Lipase, blood     Status: Abnormal   Collection Time: 10/28/14  3:10 PM  Result Value Ref Range   Lipase 15 (L) 22 - 51 U/L  Comprehensive metabolic panel     Status: Abnormal   Collection Time: 10/28/14  3:10 PM  Result Value Ref Range   Sodium 137 135 - 145 mmol/L   Potassium 3.7 3.5 - 5.1 mmol/L   Chloride 103 101 - 111 mmol/L   CO2 23 22 - 32 mmol/L   Glucose, Bld 105 (H) 65 - 99 mg/dL   BUN 12 6 - 20 mg/dL   Creatinine, Ser 0.98 0.61 - 1.24 mg/dL   Calcium 8.9 8.9 - 11.9 mg/dL   Total Protein 6.1 (L) 6.5 - 8.1 g/dL   Albumin 3.5 3.5 - 5.0 g/dL   AST 18 15 - 41 U/L   ALT 14 (L) 17 - 63 U/L   Alkaline Phosphatase 35 (L) 38 - 126 U/L   Total Bilirubin 0.7 0.3 - 1.2 mg/dL   GFR calc non Af Amer >60 >60 mL/min   GFR calc Af Amer >60 >60 mL/min   Anion gap 11 5 - 15  CBC     Status: Abnormal   Collection Time: 10/28/14  3:10 PM  Result Value Ref Range   WBC 5.4 4.0 - 10.5 K/uL   RBC 4.66 4.22 - 5.81 MIL/uL   Hemoglobin 13.4 13.0 - 17.0 g/dL   HCT 14.7 82.9 - 56.2 %   MCV 86.3 78.0 - 100.0 fL   MCH 28.8 26.0 - 34.0 pg   MCHC 33.3 30.0 - 36.0 g/dL   RDW 13.0 86.5 - 78.4 %   Platelets 135 (L) 150 - 400 K/uL  I-Stat Troponin, ED (not at [facility])     Status: None   Collection Time: 10/28/14  3:16 PM  Result Value Ref Range   Troponin i, poc 0.02 0.00 - 0.08 ng/mL   Comment 3          I-Stat CG4 Lactic Acid, ED     Status: None   Collection Time: 10/28/14  3:17 PM  Result Value Ref Range   Lactic Acid, Venous 1.14 0.5 - 2.0 mmol/L  Urinalysis, Routine w reflex microscopic (  not at [facility])     Status: Abnormal   Collection Time: 10/28/14  4:28 PM  Result Value Ref  Range   Color, Urine AMBER (A) YELLOW   APPearance CLEAR CLEAR   Specific Gravity, Urine 1.019 1.005 - 1.030   pH 6.0 5.0 - 8.0   Glucose, UA NEGATIVE NEGATIVE mg/dL   Hgb urine dipstick NEGATIVE NEGATIVE   Bilirubin Urine NEGATIVE NEGATIVE   Ketones, ur NEGATIVE NEGATIVE mg/dL   Protein, ur 30 (A) NEGATIVE mg/dL   Urobilinogen, UA 1.0 0.0 - 1.0 mg/dL   Nitrite NEGATIVE NEGATIVE   Leukocytes, UA NEGATIVE NEGATIVE  Urine microscopic-add on     Status: None   Collection Time: 10/28/14  4:28 PM  Result Value Ref Range   Squamous Epithelial / LPF RARE RARE   WBC, UA 0-2 <3 WBC/hpf   RBC / HPF 0-2 <3 RBC/hpf   Bacteria, UA RARE RARE  I-Stat CG4 Lactic Acid, ED     Status: None   Collection Time: 10/28/14  5:32 PM  Result Value Ref Range   Lactic Acid, Venous 0.66 0.5 - 2.0 mmol/L  Glucose, capillary     Status: Abnormal   Collection Time: 10/28/14 10:39 PM  Result Value Ref Range   Glucose-Capillary 152 (H) 65 - 99 mg/dL   Comment 1 Notify RN    Comment 2 Document in Chart   TSH     Status: None   Collection Time: 10/29/14  5:02 AM  Result Value Ref Range   TSH 1.116 0.350 - 4.500 uIU/mL  Glucose, capillary     Status: Abnormal   Collection Time: 10/29/14  6:34 AM  Result Value Ref Range   Glucose-Capillary 101 (H) 65 - 99 mg/dL   Comment 1 Notify RN    Comment 2 Document in Chart   Glucose, capillary     Status: Abnormal   Collection Time: 10/29/14 11:27 AM  Result Value Ref Range   Glucose-Capillary 118 (H) 65 - 99 mg/dL   Comment 1 Notify RN     Signed: Selina Cooley, MD 10/29/2014, 2:19 PM

## 2014-10-29 NOTE — Progress Notes (Signed)
Patient ID: [name], male   DOB: 02-04-1928, 81 y.o.   MRN: [id number]   Subjective: [name] says he's feeling much better today. He denies any chills, his diarrhea is improving, and he is no longer coughing.  Objective: Vital signs in last 24 hours: Filed Vitals:   10/28/14 2247 10/29/14 0148 10/29/14 0645 10/29/14 0939  BP: 150/45 123/46 157/48 137/45  Pulse: 69 64 57 66  Temp: 100 F (37.8 C) 100.2 F (37.9 C) 98.3 F (36.8 C) 98.5 F (36.9 C)  TempSrc: Oral Oral Oral Oral  Resp: Height:  (1.727 m)     Weight: 74.571 kg (164 lb 6.4 oz)     SpO2: 96% 94% 94% 97%   General: resting in bed HEENT: No scleral icterus Cardiac: RRR, no rubs, murmurs or gallops Pulm: clear to auscultation bilaterally, moving normal volumes of air Abd: soft, nontender, nondistended, BS present Ext: warm and well perfused, no pedal edema.  Studies/Results: 10/28/2014: Abdominal CT: IMPRESSION: Abnormal profusion to left hepatic lobe is nonspecific however may be secondary to thrombosed branch of the portal vein. Consider further evaluation of the liver with dedicated hepatic MRI.  Sigmoid colonic diverticulosis without evidence for acute diverticulitis.  Small bowel containing left inguinal hernia. No evidence for obstruction.  Interval increase infrarenal abdominal aortic ectasia measuring 2.8 cm. Ectatic abdominal aorta at risk for aneurysm development. Recommend followup by ultrasound in 5 years. This recommendation follows ACR consensus guidelines: White Paper of the ACR Incidental Findings Committee II on Vascular Findings. J Am Coll Radiol 2013; 10:789-794.   Electronically Signed   By: [name] M.D.   On: 10/28/2014 20:36   10/29/2014 AP/Lateral CXR: FINDINGS: Normal heart size, mediastinal contours and pulmonary vascularity for technique.  Mild chronic central peribronchial thickening.  Minimal RIGHT basilar atelectasis and slight chronic accentuation of interstitial  markings unchanged.  No acute infiltrate, pleural effusion or pneumothorax.  Bones unremarkable.  IMPRESSION: Chronic bronchitic changes and RIGHT basilar atelectasis.  No acute abnormalities.   Electronically Signed   By: [name] M.D.   On: 10/28/2014 14:55    Medications: I have reviewed the patient's current medications. Scheduled Meds: . amLODipine  2.5 mg Oral Daily  . aspirin EC  81 mg Oral Daily  . enoxaparin (LOVENOX) injection  40 mg Subcutaneous Q24H  . finasteride  5 mg Oral Daily  . insulin aspart  0-5 Units Subcutaneous QHS  . insulin aspart  0-9 Units Subcutaneous TID WC  . latanoprost  1 drop Both Eyes QHS  . levothyroxine  25 mcg Oral QAC breakfast  . lisinopril  40 mg Oral Daily  . pantoprazole  40 mg Oral Daily  . pravastatin  40 mg Oral q1800  . terazosin  10 mg Oral QHS   Continuous Infusions:  PRN Meds:.acetaminophen, ipratropium-albuterol   Assessment/Plan: Fever, diarrhea, and cough: Appears to be secondary to viral gastroenteritis and viral bronchitis. His abdominal CT was unremarkable and 2-view CXR was negative for consolidations, showing only chronic COPD changes with some bronchiectasis in the right lower lobe. He has clinically improved overnight and remains afebrile. He has duonebs at home for chronic COPD. He says he feels ready to go home with his wife. -Discharge today with supportive care for viral gastroenteritis and viral bronchitis  Left hepatic lobe ischemia: Incidentally noted on abdominal CT without changes in liver enzymes. The radiologist recommend further evaluation with abdominal MRI. -Follow-up abdominal MRI as outpatient  Ectatic  abdominal aorta: Also incidentally noted on abdominal CT, with recommendation for follow-up ultrasound in 5 years. He has been hemodynamically stable and his abdominal exam is benign. -Follow-up ultrasound in 5 years as outpatient  Benign prostatic hypertrophy: On prazosin and terazosin. Stable. Urinalysis  normal.  Diabetes: Well-controlled on home medications.  Dispo: Anticipated discharge today.  The patient does have a current PCP [facility]) and does need an [facility] hospital follow-up appointment after discharge.  The patient does not have transportation limitations that hinder transportation to clinic appointments.  .Services Needed at time of discharge: Y = Yes, Blank = No PT:   OT:   RN:   Equipment:   Other:       Bruce Cooley, MD 10/29/2014, 12:03 PM

## 2014-10-29 NOTE — Progress Notes (Signed)
Patient is discharged from room 4N02 at this time. Alert and in stable condition. IV site d/c'd. Instructions read to patient and understanding verbalized. Left unit via wheelchair with all belongings and wife at side.

## 2014-10-29 NOTE — ED Provider Notes (Signed)
Received care of patient at 4 PM from Dr. Jed Limerick. Please see his note for prior history, physical and care. Briefly this is an 81 year old male with a history of COPD, BPH, hypertension who presents with concern of intermittent lower abdominal pain, cough and fever.   Blood cultures drawn and patient was empirically covered with Rocephin and azithromycin for concern of kidney acquired pneumonia by history and possibility of UTI. Chest x-ray returned showing no clear signs of pneumonia. Urinalysis pending with plan a CT abdomen pelvis and admission if he urinalysis does not show signs of infection.   Urinalysis returned without signs of infection and CT abdomen pelvis is ordered which showed a question of portal venous thrombosis. Patient does not have shortness of breath or tachycardia to suggest pulmonary embolus.  Unclear source of fever at this time, with possibilities including a viral infection or other bacteremia with blood cultures pending.  Given patient's age and illness will admit for abx and continued evaluation.  Pt admitted to medicine in stable condition.  Alvira Monday, MD 10/29/14 [phone]

## 2014-11-02 LAB — CULTURE, BLOOD (ROUTINE X 2)
CULTURE: NO GROWTH
CULTURE: NO GROWTH

## 2015-05-16 ENCOUNTER — Encounter (HOSPITAL_COMMUNITY): Payer: Self-pay | Admitting: Emergency Medicine

## 2015-05-16 DIAGNOSIS — Z7951 Long term (current) use of inhaled steroids: Secondary | ICD-10-CM | POA: Insufficient documentation

## 2015-05-16 DIAGNOSIS — Z7952 Long term (current) use of systemic steroids: Secondary | ICD-10-CM | POA: Insufficient documentation

## 2015-05-16 DIAGNOSIS — J441 Chronic obstructive pulmonary disease with (acute) exacerbation: Secondary | ICD-10-CM | POA: Diagnosis not present

## 2015-05-16 DIAGNOSIS — I4891 Unspecified atrial fibrillation: Secondary | ICD-10-CM | POA: Diagnosis not present

## 2015-05-16 DIAGNOSIS — Z9889 Other specified postprocedural states: Secondary | ICD-10-CM | POA: Insufficient documentation

## 2015-05-16 DIAGNOSIS — Z8673 Personal history of transient ischemic attack (TIA), and cerebral infarction without residual deficits: Secondary | ICD-10-CM | POA: Insufficient documentation

## 2015-05-16 DIAGNOSIS — R300 Dysuria: Secondary | ICD-10-CM | POA: Insufficient documentation

## 2015-05-16 DIAGNOSIS — Z7982 Long term (current) use of aspirin: Secondary | ICD-10-CM | POA: Insufficient documentation

## 2015-05-16 DIAGNOSIS — I10 Essential (primary) hypertension: Secondary | ICD-10-CM | POA: Insufficient documentation

## 2015-05-16 DIAGNOSIS — Z7984 Long term (current) use of oral hypoglycemic drugs: Secondary | ICD-10-CM | POA: Diagnosis not present

## 2015-05-16 DIAGNOSIS — Z792 Long term (current) use of antibiotics: Secondary | ICD-10-CM | POA: Diagnosis not present

## 2015-05-16 DIAGNOSIS — E119 Type 2 diabetes mellitus without complications: Secondary | ICD-10-CM | POA: Insufficient documentation

## 2015-05-16 DIAGNOSIS — Z87891 Personal history of nicotine dependence: Secondary | ICD-10-CM | POA: Diagnosis not present

## 2015-05-16 DIAGNOSIS — Z79899 Other long term (current) drug therapy: Secondary | ICD-10-CM | POA: Diagnosis not present

## 2015-05-16 DIAGNOSIS — M6281 Muscle weakness (generalized): Secondary | ICD-10-CM | POA: Diagnosis present

## 2015-05-16 DIAGNOSIS — H9190 Unspecified hearing loss, unspecified ear: Secondary | ICD-10-CM | POA: Insufficient documentation

## 2015-05-16 NOTE — ED Notes (Signed)
Pt. reports dysuria onset this week , denies hematuria / no fever or chills.

## 2015-05-17 ENCOUNTER — Emergency Department (HOSPITAL_COMMUNITY)
Admission: EM | Admit: 2015-05-17 | Discharge: 2015-05-17 | Disposition: A | Discharge status: Home / Self Care | Payer: Medicare Other | Attending: Emergency Medicine | Admitting: Emergency Medicine

## 2015-05-17 ENCOUNTER — Emergency Department (HOSPITAL_COMMUNITY): Payer: Medicare Other

## 2015-05-17 DIAGNOSIS — J441 Chronic obstructive pulmonary disease with (acute) exacerbation: Secondary | ICD-10-CM

## 2015-05-17 LAB — CBC WITH DIFFERENTIAL/PLATELET
BASOS ABS: 0 10*3/uL (ref 0.0–0.1)
BASOS PCT: 0 %
Eosinophils Absolute: 0.2 10*3/uL (ref 0.0–0.7)
Eosinophils Relative: 2 %
HEMATOCRIT: 41 % (ref 39.0–52.0)
Hemoglobin: 12.8 g/dL — ABNORMAL LOW (ref 13.0–17.0)
Lymphocytes Relative: 9 %
Lymphs Abs: 0.8 10*3/uL (ref 0.7–4.0)
MCH: 27.8 pg (ref 26.0–34.0)
MCHC: 31.2 g/dL (ref 30.0–36.0)
MCV: 89.1 fL (ref 78.0–100.0)
Monocytes Absolute: 0.7 10*3/uL (ref 0.1–1.0)
Monocytes Relative: 8 %
NEUTROS ABS: 7.9 10*3/uL — AB (ref 1.7–7.7)
NEUTROS PCT: 81 %
Platelets: 166 10*3/uL (ref 150–400)
RBC: 4.6 MIL/uL (ref 4.22–5.81)
RDW: 13.5 % (ref 11.5–15.5)
WBC: 9.7 10*3/uL (ref 4.0–10.5)

## 2015-05-17 LAB — BASIC METABOLIC PANEL
ANION GAP: 11 (ref 5–15)
BUN: 12 mg/dL (ref 6–20)
CO2: 25 mmol/L (ref 22–32)
Calcium: 9.3 mg/dL (ref 8.9–10.3)
Chloride: 103 mmol/L (ref 101–111)
Creatinine, Ser: 1.14 mg/dL (ref 0.61–1.24)
GFR calc non Af Amer: 57 mL/min — ABNORMAL LOW (ref >60)
Glucose, Bld: 148 mg/dL — ABNORMAL HIGH (ref 65–99)
POTASSIUM: 4.6 mmol/L (ref 3.5–5.1)
Sodium: 139 mmol/L (ref 135–145)

## 2015-05-17 LAB — URINALYSIS, ROUTINE W REFLEX MICROSCOPIC
Bilirubin Urine: NEGATIVE
Glucose, UA: NEGATIVE mg/dL
Hgb urine dipstick: NEGATIVE
Ketones, ur: NEGATIVE mg/dL
LEUKOCYTES UA: NEGATIVE
Nitrite: NEGATIVE
PH: 6 (ref 5.0–8.0)
Protein, ur: NEGATIVE mg/dL
SPECIFIC GRAVITY, URINE: 1.012 (ref 1.005–1.030)

## 2015-05-17 MED ORDER — PREDNISONE 20 MG PO TABS
60.0000 mg | ORAL_TABLET | Freq: Once | ORAL | Status: AC
  Administered 2015-05-17: 60 mg via ORAL
  Filled 2015-05-17: qty 3

## 2015-05-17 MED ORDER — DOXYCYCLINE HYCLATE 100 MG PO CAPS: 100.0000 mg | ORAL_CAPSULE | Freq: Two times a day (BID) | ORAL | Status: DC

## 2015-05-17 MED ORDER — PREDNISONE 20 MG PO TABS: 60.0000 mg | ORAL_TABLET | Freq: Every day | ORAL | Status: DC

## 2015-05-17 MED ORDER — ALBUTEROL SULFATE HFA 108 (90 BASE) MCG/ACT IN AERS: 2.0000 | INHALATION_SPRAY | RESPIRATORY_TRACT | Status: DC | PRN

## 2015-05-17 MED ORDER — IPRATROPIUM-ALBUTEROL 0.5-2.5 (3) MG/3ML IN SOLN
3.0000 mL | Freq: Once | RESPIRATORY_TRACT | Status: AC
  Administered 2015-05-17: 3 mL via RESPIRATORY_TRACT
  Filled 2015-05-17: qty 3

## 2015-05-17 NOTE — ED Provider Notes (Signed)
CSN: [id number]     Arrival date & time 05/16/15  2326 History   First MD Initiated Contact with Patient 05/17/15 [phone]     Chief Complaint  Patient presents with  . Dysuria     (Consider location/radiation/quality/duration/timing/severity/associated sxs/prior Treatment) HPI  This is an 82 year old male with a history of COPD, atrial fibrillation, and diabetes who presents with generalized weakness and dysuria. Patient reports one week history of dysuria. Also reports over last 2-3 days he's had generalized weakness. Reports productive cough. No fevers. Denies myalgias. Denies chest pain. Denies nausea, vomiting, or diarrhea.  Past Medical History  Diagnosis Date  . CVA (cerebral infarction)   . COPD (chronic obstructive pulmonary disease) (HCC)   . Essential hypertension, benign   . Diabetes mellitus without complication (HCC)   . Atrial fibrillation (HCC)   . Hard of hearing    Past Surgical History  Procedure Laterality Date  . Hernia repair    . Left heart catheterization with coronary angiogram N/A 03/28/2013    Procedure: LEFT HEART CATHETERIZATION WITH CORONARY ANGIOGRAM;  Surgeon: Peter M Swaz[place], MD;  Location: [facility] CATH LAB;  Service: Cardiovascular;  Laterality: N/A;   No family history on file. Social History  Substance Use Topics  . Smoking status: Former Smoker    Types: Cigarettes  . Smokeless tobacco: Former Neurosurgeon  . Alcohol Use: No    Review of Systems  Constitutional: Negative.  Negative for fever.  Respiratory: Positive for cough and shortness of breath. Negative for chest tightness.   Cardiovascular: Negative.  Negative for chest pain and leg swelling.  Gastrointestinal: Negative.  Negative for nausea, vomiting and abdominal pain.  Genitourinary: Positive for dysuria.  All other systems reviewed and are negative.     Allergies  Review of patient's allergies indicates no known allergies.  Home Medications   Prior to Admission medications   Medication  Sig Start Date End Date Taking? Authorizing Provider  albuterol (PROVENTIL HFA;VENTOLIN HFA) 108 (90 Base) MCG/ACT inhaler Inhale 2 puffs into the lungs every 4 (four) hours as needed for wheezing or shortness of breath. 05/17/15   Shon Baton, MD  amLODipine (NORVASC) 5 MG tablet Take 2.5 mg by mouth daily.    Historical Provider, MD  aspirin EC 81 MG EC tablet Take 1 tablet (81 mg total) by mouth daily. 03/29/13   Maryann Mikhail, DO  budesonide-formoterol (SYMBICORT) 80-4.5 MCG/ACT inhaler Inhale 2 puffs into the lungs 2 (two) times daily.    Historical Provider, MD  doxycycline (VIBRAMYCIN) 100 MG capsule Take 1 capsule (100 mg total) by mouth 2 (two) times daily. 05/17/15   Shon Baton, MD  finasteride (PROSCAR) 5 MG tablet Take 5 mg by mouth daily.    Historical Provider, MD  hydrochlorothiazide (HYDRODIURIL) 25 MG tablet Take 12.5 mg by mouth daily.    Historical Provider, MD  Ipratropium-Albuterol (COMBIVENT RESPIMAT) 20-100 MCG/ACT AERS respimat Inhale 1 puff into the lungs every 6 (six) hours as needed for wheezing.     Historical Provider, MD  ketotifen (ZADITOR) 0.025 % ophthalmic solution Place 2 drops into both eyes 2 (two) times daily.    Historical Provider, MD  latanoprost (XALATAN) 0.005 % ophthalmic solution Place 1 drop into both eyes at bedtime.    Historical Provider, MD  levothyroxine (SYNTHROID, LEVOTHROID) 25 MCG tablet Take 25 mcg by mouth daily before breakfast.    Historical Provider, MD  lisinopril (PRINIVIL,ZESTRIL) 40 MG tablet Take 40 mg by mouth daily.    Historical  Provider, MD  metFORMIN (GLUCOPHAGE) 500 MG tablet Take 500 mg by mouth 2 (two) times daily with a meal.     Historical Provider, MD  metoprolol (LOPRESSOR) 50 MG tablet Take 25 mg by mouth 2 (two) times daily.    Historical Provider, MD  Multiple Vitamins-Minerals (MULTIVITAMIN WITH MINERALS) tablet Take 1 tablet by mouth daily.    Historical Provider, MD  omeprazole (PRILOSEC) 20 MG capsule Take  20 mg by mouth 2 (two) times daily before a meal.    Historical Provider, MD  pravastatin (PRAVACHOL) 40 MG tablet Take 40 mg by mouth daily.    Historical Provider, MD  predniSONE (DELTASONE) 20 MG tablet Take 3 tablets (60 mg total) by mouth daily with breakfast. 05/17/15   Shon Baton, MD  terazosin (HYTRIN) 10 MG capsule Take 10 mg by mouth at bedtime.    Historical Provider, MD   BP 162/55 mmHg  Pulse 67  Temp(Src) 98 F (36.7 C) (Oral)  Resp 20  Wt 163 lb 12.8 oz (74.299 kg)  SpO2 96% Physical Exam  Constitutional: He is oriented to person, place, and time. No distress.  Chronically ill-appearing, no acute distress  HENT:  Head: Normocephalic and atraumatic.  Eyes: Pupils are equal, round, and reactive to light.  Cardiovascular: Normal rate, regular rhythm and normal heart sounds.   No murmur heard. Pulmonary/Chest: Effort normal. No respiratory distress. He has wheezes. He exhibits no tenderness.  Abdominal: Soft. Bowel sounds are normal. There is no tenderness. There is no rebound and no guarding.  Musculoskeletal: He exhibits no edema.  Lymphadenopathy:    He has no cervical adenopathy.  Neurological: He is alert and oriented to person, place, and time.  Skin: Skin is warm and dry.  Psychiatric: He has a normal mood and affect.  Nursing note and vitals reviewed.   ED Course  Procedures (including critical care time) Labs Review Labs Reviewed  CBC WITH DIFFERENTIAL/PLATELET - Abnormal; Notable for the following:    Hemoglobin 12.8 (*)    Neutro Abs 7.9 (*)    All other components within normal limits  BASIC METABOLIC PANEL - Abnormal; Notable for the following:    Glucose, Bld 148 (*)    GFR calc non Af Amer 57 (*)    All other components within normal limits  URINALYSIS, ROUTINE W REFLEX MICROSCOPIC (NOT AT [facility])    Imaging Review Dg Chest 2 View  05/17/2015  CLINICAL DATA:  Acute onset of cough and congestion. Generalized chest pain and shortness of  breath. Initial encounter. EXAM: CHEST  2 VIEW COMPARISON:  Chest radiograph performed 10/29/2014 FINDINGS: The lungs are well-aerated. Peribronchial thickening is noted. Mild bibasilar atelectasis is seen, and chronically increased interstitial markings are noted. There is no evidence of pleural effusion or pneumothorax. The heart is normal in size; the mediastinal contour is within normal limits. No acute osseous abnormalities are seen. IMPRESSION: Peribronchial thickening noted. Mild bibasilar atelectasis seen. Increased interstitial markings noted. Electronically Signed   By: Roanna Raider M.D.   On: 05/17/2015 06:22   I have personally reviewed and evaluated these images and lab results as part of my medical decision-making.   EKG Interpretation   Date/Time:  Monday May 17 2015 07:08:52 EST Ventricular Rate:  65 PR Interval:  155 QRS Duration: 145 QT Interval:  444 QTC Calculation: 462 R Axis:   -85 Text Interpretation:  Sinus rhythm RBBB and LAFB No significant change  since last tracing Confirmed by Kasson Lamere  MD, Demia Viera (  [id number]) on 05/17/2015  7:16:51 AM      MDM   Final diagnoses:  COPD exacerbation (HCC)    Patient presents with dysuria and cough. Nontoxic on exam. Afebrile. History of COPD. Wheezing. Per the patient he has home oxygen when necessary. He did not bring it with him. Patient was given prednisone and a duo neb. No evidence of pneumonia on chest x-ray and other lab work is reassuring. Urinalysis without evidence of urinary tract infection.   Patient with improvement of wheezing on recheck. States he feels improved. He ambulated and drop his pulse ox to 82%; however, he does have home oxygen.  Suspect acute COPD exacerbation. Will discharge with prednisone, an inhaler, doxycycline. Patient encouraged to use his home oxygen.  After history, exam, and medical workup I feel the patient has been appropriately medically screened and is safe for discharge home.  Pertinent diagnoses were discussed with the patient. Patient was given return precautions.     Shon Baton, MD 05/17/15 [phone]

## 2015-05-17 NOTE — Discharge Instructions (Signed)
You were seen today for generalized weakness and cough. You appear to be having a COPD exacerbation. He need to wear home oxygen and take prednisone at home. He'll be given antibiotics. If you have any new or worsening symptoms she should be reevaluated.   Chronic Obstructive Pulmonary Disease Chronic obstructive pulmonary disease (COPD) is a common lung condition in which airflow from the lungs is limited. COPD is a general term that can be used to describe many different lung problems that limit airflow, including both chronic bronchitis and emphysema. If you have COPD, your lung function will probably never return to normal, but there are measures you can take to improve lung function and make yourself feel better. CAUSES   Smoking (common).  Exposure to secondhand smoke.  Genetic problems.  Chronic inflammatory lung diseases or recurrent infections. SYMPTOMS  Shortness of breath, especially with physical activity.  Deep, persistent (chronic) cough with a large amount of thick mucus.  Wheezing.  Rapid breaths (tachypnea).  Gray or bluish discoloration (cyanosis) of the skin, especially in your fingers, toes, or lips.  Fatigue.  Weight loss.  Frequent infections or episodes when breathing symptoms become much worse (exacerbations).  Chest tightness. DIAGNOSIS Your health care provider will take a medical history and perform a physical examination to diagnose COPD. Additional tests for COPD may include:  Lung (pulmonary) function tests.  Chest X-ray.  CT scan.  Blood tests. TREATMENT  Treatment for COPD may include:  Inhaler and nebulizer medicines. These help manage the symptoms of COPD and make your breathing more comfortable.  Supplemental oxygen. Supplemental oxygen is only helpful if you have a low oxygen level in your blood.  Exercise and physical activity. These are beneficial for nearly all people with COPD.  Lung surgery or transplant.  Nutrition therapy  to gain weight, if you are underweight.  Pulmonary rehabilitation. This may involve working with a team of health care providers and specialists, such as respiratory, occupational, and physical therapists. HOME CARE INSTRUCTIONS  Take all medicines (inhaled or pills) as directed by your health care provider.  Avoid over-the-counter medicines or cough syrups that dry up your airway (such as antihistamines) and slow down the elimination of secretions unless instructed otherwise by your health care provider.  If you are a smoker, the most important thing that you can do is stop smoking. Continuing to smoke will cause further lung damage and breathing trouble. Ask your health care provider for help with quitting smoking. He or she can direct you to community resources or hospitals that provide support.  Avoid exposure to irritants such as smoke, chemicals, and fumes that aggravate your breathing.  Use oxygen therapy and pulmonary rehabilitation if directed by your health care provider. If you require home oxygen therapy, ask your health care provider whether you should purchase a pulse oximeter to measure your oxygen level at home.  Avoid contact with individuals who have a contagious illness.  Avoid extreme temperature and humidity changes.  Eat healthy foods. Eating smaller, more frequent meals and resting before meals may help you maintain your strength.  Stay active, but balance activity with periods of rest. Exercise and physical activity will help you maintain your ability to do things you want to do.  Preventing infection and hospitalization is very important when you have COPD. Make sure to receive all the vaccines your health care provider recommends, especially the pneumococcal and influenza vaccines. Ask your health care provider whether you need a pneumonia vaccine.  Learn and use  relaxation techniques to manage stress.  Learn and use controlled breathing techniques as directed by  your health care provider. Controlled breathing techniques include:  Pursed lip breathing. Start by breathing in (inhaling) through your nose for 1 second. Then, purse your lips as if you were going to whistle and breathe out (exhale) through the pursed lips for 2 seconds.  Diaphragmatic breathing. Start by putting one hand on your abdomen just above your waist. Inhale slowly through your nose. The hand on your abdomen should move out. Then purse your lips and exhale slowly. You should be able to feel the hand on your abdomen moving in as you exhale.  Learn and use controlled coughing to clear mucus from your lungs. Controlled coughing is a series of short, progressive coughs. The steps of controlled coughing are: 1. Lean your head slightly forward. 2. Breathe in deeply using diaphragmatic breathing. 3. Try to hold your breath for 3 seconds. 4. Keep your mouth slightly open while coughing twice. 5. Spit any mucus out into a tissue. 6. Rest and repeat the steps once or twice as needed. SEEK MEDICAL CARE IF:  You are coughing up more mucus than usual.  There is a change in the color or thickness of your mucus.  Your breathing is more labored than usual.  Your breathing is faster than usual. SEEK IMMEDIATE MEDICAL CARE IF:  You have shortness of breath while you are resting.  You have shortness of breath that prevents you from:  Being able to talk.  Performing your usual physical activities.  You have chest pain lasting longer than 5 minutes.  Your skin color is more cyanotic than usual.  You measure low oxygen saturations for longer than 5 minutes with a pulse oximeter. MAKE SURE YOU:  Understand these instructions.  Will watch your condition.  Will get help right away if you are not doing well or get worse.   This information is not intended to replace advice given to you by your health care provider. Make sure you discuss any questions you have with your health care  provider.   Document Released: 12/21/2004 Document Revised: 04/03/2014 Document Reviewed: 11/07/2012 Elsevier Interactive Patient Education [employer]

## 2015-05-17 NOTE — ED Notes (Signed)
Pt oxygen saturation dropped when mbulating on room air to approx 82%. Pt states he uses oxygen at home when he needs it. 2L applied via Nasal cannula. Pt assisted back to bed.

## 2015-05-20 ENCOUNTER — Encounter (HOSPITAL_COMMUNITY): Payer: Self-pay | Admitting: Emergency Medicine

## 2015-05-20 ENCOUNTER — Inpatient Hospital Stay (HOSPITAL_COMMUNITY)
Admission: EM | Admit: 2015-05-20 | Discharge: 2015-05-22 | DRG: 308 | Disposition: A | Discharge status: Home / Self Care | Payer: Medicare Other | Attending: Internal Medicine | Admitting: Internal Medicine

## 2015-05-20 ENCOUNTER — Emergency Department (HOSPITAL_COMMUNITY): Payer: Medicare Other

## 2015-05-20 DIAGNOSIS — N179 Acute kidney failure, unspecified: Secondary | ICD-10-CM | POA: Diagnosis present

## 2015-05-20 DIAGNOSIS — J209 Acute bronchitis, unspecified: Secondary | ICD-10-CM | POA: Diagnosis present

## 2015-05-20 DIAGNOSIS — Z7984 Long term (current) use of oral hypoglycemic drugs: Secondary | ICD-10-CM

## 2015-05-20 DIAGNOSIS — R079 Chest pain, unspecified: Secondary | ICD-10-CM | POA: Diagnosis present

## 2015-05-20 DIAGNOSIS — Z7982 Long term (current) use of aspirin: Secondary | ICD-10-CM

## 2015-05-20 DIAGNOSIS — I4510 Unspecified right bundle-branch block: Secondary | ICD-10-CM | POA: Diagnosis present

## 2015-05-20 DIAGNOSIS — I25119 Atherosclerotic heart disease of native coronary artery with unspecified angina pectoris: Secondary | ICD-10-CM | POA: Diagnosis present

## 2015-05-20 DIAGNOSIS — Z7952 Long term (current) use of systemic steroids: Secondary | ICD-10-CM

## 2015-05-20 DIAGNOSIS — Z87891 Personal history of nicotine dependence: Secondary | ICD-10-CM

## 2015-05-20 DIAGNOSIS — I4891 Unspecified atrial fibrillation: Secondary | ICD-10-CM | POA: Diagnosis not present

## 2015-05-20 DIAGNOSIS — J441 Chronic obstructive pulmonary disease with (acute) exacerbation: Secondary | ICD-10-CM | POA: Diagnosis present

## 2015-05-20 DIAGNOSIS — Z9119 Patient's noncompliance with other medical treatment and regimen: Secondary | ICD-10-CM

## 2015-05-20 DIAGNOSIS — R778 Other specified abnormalities of plasma proteins: Secondary | ICD-10-CM | POA: Insufficient documentation

## 2015-05-20 DIAGNOSIS — R7989 Other specified abnormal findings of blood chemistry: Secondary | ICD-10-CM | POA: Insufficient documentation

## 2015-05-20 DIAGNOSIS — J9621 Acute and chronic respiratory failure with hypoxia: Secondary | ICD-10-CM | POA: Diagnosis present

## 2015-05-20 DIAGNOSIS — I4581 Long QT syndrome: Secondary | ICD-10-CM

## 2015-05-20 DIAGNOSIS — J440 Chronic obstructive pulmonary disease with acute lower respiratory infection: Secondary | ICD-10-CM | POA: Diagnosis present

## 2015-05-20 DIAGNOSIS — I69320 Aphasia following cerebral infarction: Secondary | ICD-10-CM

## 2015-05-20 DIAGNOSIS — H9190 Unspecified hearing loss, unspecified ear: Secondary | ICD-10-CM | POA: Diagnosis present

## 2015-05-20 DIAGNOSIS — E1165 Type 2 diabetes mellitus with hyperglycemia: Secondary | ICD-10-CM | POA: Diagnosis present

## 2015-05-20 DIAGNOSIS — Z8673 Personal history of transient ischemic attack (TIA), and cerebral infarction without residual deficits: Secondary | ICD-10-CM

## 2015-05-20 DIAGNOSIS — R9431 Abnormal electrocardiogram [ECG] [EKG]: Secondary | ICD-10-CM | POA: Diagnosis present

## 2015-05-20 DIAGNOSIS — IMO0002 Reserved for concepts with insufficient information to code with codable children: Secondary | ICD-10-CM | POA: Diagnosis present

## 2015-05-20 DIAGNOSIS — I10 Essential (primary) hypertension: Secondary | ICD-10-CM | POA: Diagnosis present

## 2015-05-20 DIAGNOSIS — E785 Hyperlipidemia, unspecified: Secondary | ICD-10-CM | POA: Diagnosis present

## 2015-05-20 DIAGNOSIS — I2510 Atherosclerotic heart disease of native coronary artery without angina pectoris: Secondary | ICD-10-CM | POA: Diagnosis present

## 2015-05-20 DIAGNOSIS — E039 Hypothyroidism, unspecified: Secondary | ICD-10-CM | POA: Diagnosis present

## 2015-05-20 DIAGNOSIS — Z792 Long term (current) use of antibiotics: Secondary | ICD-10-CM

## 2015-05-20 DIAGNOSIS — J189 Pneumonia, unspecified organism: Secondary | ICD-10-CM | POA: Diagnosis present

## 2015-05-20 LAB — CBC WITH DIFFERENTIAL/PLATELET
Basophils Absolute: 0 10*3/uL (ref 0.0–0.1)
Basophils Relative: 0 %
Eosinophils Absolute: 0.1 10*3/uL (ref 0.0–0.7)
Eosinophils Relative: 1 %
HEMATOCRIT: 40.1 % (ref 39.0–52.0)
HEMOGLOBIN: 13.1 g/dL (ref 13.0–17.0)
LYMPHS ABS: 1.3 10*3/uL (ref 0.7–4.0)
Lymphocytes Relative: 14 %
MCH: 29.2 pg (ref 26.0–34.0)
MCHC: 32.7 g/dL (ref 30.0–36.0)
MCV: 89.3 fL (ref 78.0–100.0)
MONOS PCT: 10 %
Monocytes Absolute: 1 10*3/uL (ref 0.1–1.0)
NEUTROS PCT: 74 %
Neutro Abs: 7.1 10*3/uL (ref 1.7–7.7)
Platelets: 179 10*3/uL (ref 150–400)
RBC: 4.49 MIL/uL (ref 4.22–5.81)
RDW: 13.5 % (ref 11.5–15.5)
WBC: 9.6 10*3/uL (ref 4.0–10.5)

## 2015-05-20 LAB — URINALYSIS, ROUTINE W REFLEX MICROSCOPIC
GLUCOSE, UA: 100 mg/dL — AB
HGB URINE DIPSTICK: NEGATIVE
Ketones, ur: 40 mg/dL — AB
Leukocytes, UA: NEGATIVE
Nitrite: NEGATIVE
PROTEIN: NEGATIVE mg/dL
Specific Gravity, Urine: 1.017 (ref 1.005–1.030)
pH: 5.5 (ref 5.0–8.0)

## 2015-05-20 LAB — COMPREHENSIVE METABOLIC PANEL
ALK PHOS: 42 U/L (ref 38–126)
ALT: 12 U/L — ABNORMAL LOW (ref 17–63)
ANION GAP: 13 (ref 5–15)
AST: 15 U/L (ref 15–41)
Albumin: 3.3 g/dL — ABNORMAL LOW (ref 3.5–5.0)
BILIRUBIN TOTAL: 1.1 mg/dL (ref 0.3–1.2)
BUN: 23 mg/dL — ABNORMAL HIGH (ref 6–20)
CALCIUM: 9 mg/dL (ref 8.9–10.3)
CO2: 25 mmol/L (ref 22–32)
Chloride: 101 mmol/L (ref 101–111)
Creatinine, Ser: 1.38 mg/dL — ABNORMAL HIGH (ref 0.61–1.24)
GFR, EST AFRICAN AMERICAN: 52 mL/min — AB (ref >60)
GFR, EST NON AFRICAN AMERICAN: 45 mL/min — AB (ref >60)
Glucose, Bld: 214 mg/dL — ABNORMAL HIGH (ref 65–99)
Potassium: 4.4 mmol/L (ref 3.5–5.1)
SODIUM: 139 mmol/L (ref 135–145)
TOTAL PROTEIN: 6.2 g/dL — AB (ref 6.5–8.1)

## 2015-05-20 LAB — INFLUENZA PANEL BY PCR (TYPE A & B)
H1N1FLUPCR: NOT DETECTED
INFLBPCR: NEGATIVE
Influenza A By PCR: NEGATIVE

## 2015-05-20 LAB — CBG MONITORING, ED
Glucose-Capillary: 138 mg/dL — ABNORMAL HIGH (ref 65–99)
Glucose-Capillary: 246 mg/dL — ABNORMAL HIGH (ref 65–99)

## 2015-05-20 LAB — I-STAT TROPONIN, ED: TROPONIN I, POC: 0 ng/mL (ref 0.00–0.08)

## 2015-05-20 LAB — TSH: TSH: 2.427 u[IU]/mL (ref 0.350–4.500)

## 2015-05-20 LAB — TROPONIN I
TROPONIN I: 0.06 ng/mL — AB (ref <0.031)
Troponin I: 0.05 ng/mL — ABNORMAL HIGH (ref <0.031)
Troponin I: 0.05 ng/mL — ABNORMAL HIGH (ref <0.031)

## 2015-05-20 LAB — GLUCOSE, CAPILLARY
GLUCOSE-CAPILLARY: 232 mg/dL — AB (ref 65–99)
GLUCOSE-CAPILLARY: 276 mg/dL — AB (ref 65–99)

## 2015-05-20 LAB — BRAIN NATRIURETIC PEPTIDE: B Natriuretic Peptide: 124.7 pg/mL — ABNORMAL HIGH (ref 0.0–100.0)

## 2015-05-20 MED ORDER — OSELTAMIVIR PHOSPHATE 30 MG PO CAPS
30.0000 mg | ORAL_CAPSULE | Freq: Two times a day (BID) | ORAL | Status: DC
  Administered 2015-05-20: 30 mg via ORAL
  Filled 2015-05-20 (×3): qty 1

## 2015-05-20 MED ORDER — PANTOPRAZOLE SODIUM 40 MG PO TBEC
40.0000 mg | DELAYED_RELEASE_TABLET | Freq: Every day | ORAL | Status: DC
  Administered 2015-05-20 – 2015-05-22 (×3): 40 mg via ORAL
  Filled 2015-05-20 (×3): qty 1

## 2015-05-20 MED ORDER — AMLODIPINE BESYLATE 2.5 MG PO TABS
2.5000 mg | ORAL_TABLET | Freq: Every day | ORAL | Status: DC
  Administered 2015-05-20 – 2015-05-22 (×3): 2.5 mg via ORAL
  Filled 2015-05-20 (×3): qty 1

## 2015-05-20 MED ORDER — LEVOFLOXACIN IN D5W 750 MG/150ML IV SOLN
750.0000 mg | Freq: Once | INTRAVENOUS | Status: DC
  Administered 2015-05-20: 750 mg via INTRAVENOUS
  Filled 2015-05-20: qty 150

## 2015-05-20 MED ORDER — FINASTERIDE 5 MG PO TABS
5.0000 mg | ORAL_TABLET | Freq: Every day | ORAL | Status: DC
  Administered 2015-05-20 – 2015-05-22 (×3): 5 mg via ORAL
  Filled 2015-05-20 (×3): qty 1

## 2015-05-20 MED ORDER — KETOTIFEN FUMARATE 0.025 % OP SOLN
2.0000 [drp] | Freq: Two times a day (BID) | OPHTHALMIC | Status: DC
  Administered 2015-05-20 – 2015-05-22 (×4): 2 [drp] via OPHTHALMIC
  Filled 2015-05-20 (×2): qty 5

## 2015-05-20 MED ORDER — DOXYCYCLINE HYCLATE 100 MG PO TABS
100.0000 mg | ORAL_TABLET | Freq: Two times a day (BID) | ORAL | Status: DC
  Administered 2015-05-20 – 2015-05-22 (×5): 100 mg via ORAL
  Filled 2015-05-20 (×5): qty 1

## 2015-05-20 MED ORDER — LATANOPROST 0.005 % OP SOLN
1.0000 [drp] | Freq: Every day | OPHTHALMIC | Status: DC
  Administered 2015-05-20 – 2015-05-21 (×2): 1 [drp] via OPHTHALMIC
  Filled 2015-05-20 (×2): qty 2.5

## 2015-05-20 MED ORDER — ENOXAPARIN SODIUM 80 MG/0.8ML ~~LOC~~ SOLN
70.0000 mg | Freq: Two times a day (BID) | SUBCUTANEOUS | Status: DC
  Administered 2015-05-20 – 2015-05-22 (×5): 70 mg via SUBCUTANEOUS
  Filled 2015-05-20 (×7): qty 0.8

## 2015-05-20 MED ORDER — INSULIN ASPART 100 UNIT/ML ~~LOC~~ SOLN
0.0000 [IU] | Freq: Three times a day (TID) | SUBCUTANEOUS | Status: DC
  Administered 2015-05-20: 3 [IU] via SUBCUTANEOUS
  Administered 2015-05-20: 1 [IU] via SUBCUTANEOUS
  Administered 2015-05-21 (×2): 5 [IU] via SUBCUTANEOUS
  Administered 2015-05-21: 2 [IU] via SUBCUTANEOUS
  Administered 2015-05-22: 7 [IU] via SUBCUTANEOUS
  Filled 2015-05-20 (×2): qty 1

## 2015-05-20 MED ORDER — ACETAMINOPHEN 325 MG PO TABS: 650.0000 mg | ORAL_TABLET | ORAL | Status: DC | PRN

## 2015-05-20 MED ORDER — TERAZOSIN HCL 5 MG PO CAPS
10.0000 mg | ORAL_CAPSULE | Freq: Every day | ORAL | Status: DC
  Administered 2015-05-20: 10 mg via ORAL
  Filled 2015-05-20 (×2): qty 2

## 2015-05-20 MED ORDER — OSELTAMIVIR PHOSPHATE 75 MG PO CAPS
75.0000 mg | ORAL_CAPSULE | Freq: Two times a day (BID) | ORAL | Status: DC
  Administered 2015-05-20: 75 mg via ORAL
  Filled 2015-05-20: qty 1

## 2015-05-20 MED ORDER — PRAVASTATIN SODIUM 40 MG PO TABS
40.0000 mg | ORAL_TABLET | Freq: Every day | ORAL | Status: DC
  Administered 2015-05-20 – 2015-05-22 (×3): 40 mg via ORAL
  Filled 2015-05-20 (×3): qty 1

## 2015-05-20 MED ORDER — INSULIN ASPART 100 UNIT/ML ~~LOC~~ SOLN
0.0000 [IU] | Freq: Every day | SUBCUTANEOUS | Status: DC
  Administered 2015-05-20 – 2015-05-21 (×2): 2 [IU] via SUBCUTANEOUS

## 2015-05-20 MED ORDER — ADULT MULTIVITAMIN W/MINERALS CH
1.0000 | ORAL_TABLET | Freq: Every day | ORAL | Status: DC
  Administered 2015-05-21 – 2015-05-22 (×2): 1 via ORAL
  Filled 2015-05-20 (×3): qty 1

## 2015-05-20 MED ORDER — METHYLPREDNISOLONE SODIUM SUCC 125 MG IJ SOLR
60.0000 mg | Freq: Two times a day (BID) | INTRAMUSCULAR | Status: DC
  Administered 2015-05-20 – 2015-05-21 (×3): 60 mg via INTRAVENOUS
  Filled 2015-05-20 (×3): qty 2

## 2015-05-20 MED ORDER — IPRATROPIUM-ALBUTEROL 0.5-2.5 (3) MG/3ML IN SOLN
3.0000 mL | Freq: Four times a day (QID) | RESPIRATORY_TRACT | Status: DC
  Administered 2015-05-20 – 2015-05-21 (×4): 3 mL via RESPIRATORY_TRACT
  Filled 2015-05-20 (×5): qty 3

## 2015-05-20 MED ORDER — BUDESONIDE-FORMOTEROL FUMARATE 80-4.5 MCG/ACT IN AERO
2.0000 | INHALATION_SPRAY | Freq: Two times a day (BID) | RESPIRATORY_TRACT | Status: DC
  Administered 2015-05-20 – 2015-05-21 (×4): 2 via RESPIRATORY_TRACT
  Filled 2015-05-20: qty 6.9

## 2015-05-20 MED ORDER — ALBUTEROL SULFATE HFA 108 (90 BASE) MCG/ACT IN AERS: 2.0000 | INHALATION_SPRAY | RESPIRATORY_TRACT | Status: DC | PRN

## 2015-05-20 MED ORDER — LEVOTHYROXINE SODIUM 25 MCG PO TABS
25.0000 ug | ORAL_TABLET | Freq: Every day | ORAL | Status: DC
  Administered 2015-05-20 – 2015-05-22 (×3): 25 ug via ORAL
  Filled 2015-05-20 (×5): qty 1

## 2015-05-20 MED ORDER — METOPROLOL TARTRATE 25 MG PO TABS
25.0000 mg | ORAL_TABLET | Freq: Two times a day (BID) | ORAL | Status: DC
  Administered 2015-05-20 – 2015-05-22 (×5): 25 mg via ORAL
  Filled 2015-05-20 (×5): qty 1

## 2015-05-20 MED ORDER — ASPIRIN 81 MG PO CHEW
324.0000 mg | CHEWABLE_TABLET | Freq: Once | ORAL | Status: DC
  Filled 2015-05-20: qty 4

## 2015-05-20 MED ORDER — ASPIRIN EC 81 MG PO TBEC
81.0000 mg | DELAYED_RELEASE_TABLET | Freq: Every day | ORAL | Status: DC
  Administered 2015-05-20 – 2015-05-22 (×3): 81 mg via ORAL
  Filled 2015-05-20 (×3): qty 1

## 2015-05-20 NOTE — ED Notes (Signed)
Breakfast tray ordered, admitting MD at bedside.

## 2015-05-20 NOTE — Progress Notes (Signed)
ANTICOAGULATION CONSULT NOTE - Initial Consult  Pharmacy Consult for Lovenox Indication: afib  No Known Allergies  Patient Measurements: Height:  (172.7 cm) Weight: 160 lb (72.576 kg) IBW/kg (Calculated) : 68.4  Vital Signs: Temp: 98.3 F (36.8 C) (02/23 0323) Temp Source: Oral (02/23 0323) BP: 126/70 mmHg (02/23 0800) Pulse Rate: 95 (02/23 0800)  Labs:  Recent Labs  05/20/15 0339  HGB 13.1  HCT 40.1  PLT 179  CREATININE 1.38*    Estimated Creatinine Clearance: 37.9 mL/min (by C-G formula based on Cr of 1.38).   Medical History: Past Medical History  Diagnosis Date  . CVA (cerebral infarction)   . COPD (chronic obstructive pulmonary disease) (HCC)   . Essential hypertension, benign   . Diabetes mellitus without complication (HCC)   . Atrial fibrillation (HCC)   . Hard of hearing    Assessment:  34 yoM admitted 2/23 L sided chest pain, SOB and found to be in afib. Pharmacy consulted to assist with Lovenox dosing. No anticoagulation PTA.   Goal of Therapy:  Monitor platelets by anticoagulation protocol: Yes   Plan:  1. Begin Lovenox 70 mg subQ every 12 h ours 2. SCr and CBC every 72H at minimum  3. Follow renal function closely as any decline may warrant dose change  Pollyann Samples, PharmD, BCPS 05/20/2015, 8:14 AM Pager: [phone]    Sheron Nightingale 05/20/2015,8:10 AM

## 2015-05-20 NOTE — ED Provider Notes (Signed)
CSN: [id number]     Arrival date & time 05/20/15  4098 History   By signing my name below, I, [name], attest that this documentation has been prepared under the direction and in the presence of [name], MD.  Electronically Signed: Arlan Martinez, ED Scribe. 05/20/2015. 3:44 AM.   Chief Complaint  Patient presents with  . Chest Pain   The history is provided by the patient. No language interpreter was used.    HPI Comments: [name] brought in by EMS is a 82 y.o. male with a PMHx of CVA, COPD, DM, and A-Fib who presents to the Emergency Department complaining of intermittent  L sided chest pain x few days; worsened this morning at 2:00 AM that woke him from sleep. However, currently he is chest pain free. Pt also reports diaphoresis and worsening shortness of breath. No aggravating or alleviating factors at this time. 3 Nitro administered prior to arrival with mild improvement. 10 mg of Cardizem also given en route to department with complete resolution of chest pain. No recent fever, chills, nausea, vomiting, or abdominal pain. Per EMS patient was in atrial fibrillation with heart rate in the 150s. Cardizem given per protocol.  PCP and CARDIOLOGIST: [name]    Past Medical History  Diagnosis Date  . CVA (cerebral infarction)   . COPD (chronic obstructive pulmonary disease) (HCC)   . Essential hypertension, benign   . Diabetes mellitus without complication (HCC)   . Atrial fibrillation (HCC)   . Hard of hearing    Past Surgical History  Procedure Laterality Date  . Hernia repair    . Left heart catheterization with coronary angiogram N/A 03/28/2013    Procedure: LEFT HEART CATHETERIZATION WITH CORONARY ANGIOGRAM;  Surgeon: Peter M Swaz[place], MD;  Location: [facility] CATH LAB;  Service: Cardiovascular;  Laterality: N/A;   History reviewed. No pertinent family history. Social History  Substance Use Topics  . Smoking status: Former Smoker    Types: Cigarettes  .  Smokeless tobacco: Former Neurosurgeon  . Alcohol Use: No    Review of Systems  Constitutional: Positive for diaphoresis. Negative for fever and chills.  Respiratory: Positive for shortness of breath. Negative for cough and chest tightness.   Cardiovascular: Positive for chest pain. Negative for palpitations and leg swelling.  Gastrointestinal: Negative for nausea, vomiting, abdominal pain and diarrhea.  Musculoskeletal: Positive for myalgias. Negative for back pain, neck pain and neck stiffness.  Skin: Negative for rash.  Neurological: Negative for dizziness, weakness, light-headedness, numbness and headaches.  Psychiatric/Behavioral: Negative for confusion.  All other systems reviewed and are negative.     Allergies  Review of patient's allergies indicates no known allergies.  Home Medications   Prior to Admission medications   Medication Sig Start Date End Date Taking? Authorizing Provider  albuterol (PROVENTIL HFA;VENTOLIN HFA) 108 (90 Base) MCG/ACT inhaler Inhale 2 puffs into the lungs every 4 (four) hours as needed for wheezing or shortness of breath. 05/17/15  Yes Shon Baton, MD  amLODipine (NORVASC) 5 MG tablet Take 2.5 mg by mouth daily.   Yes Historical Provider, MD  aspirin EC 81 MG EC tablet Take 1 tablet (81 mg total) by mouth daily. 03/29/13  Yes Bruce Mikhail, DO  budesonide-formoterol (SYMBICORT) 80-4.5 MCG/ACT inhaler Inhale 2 puffs into the lungs 2 (two) times daily.   Yes Historical Provider, MD  doxycycline (VIBRAMYCIN) 100 MG capsule Take 1 capsule (100 mg total) by mouth 2 (two) times daily. 05/17/15  Yes Bruce Masker  Horton, MD  finasteride (PROSCAR) 5 MG tablet Take 5 mg by mouth daily.   Yes Historical Provider, MD  hydrochlorothiazide (HYDRODIURIL) 25 MG tablet Take 12.5 mg by mouth daily.   Yes Historical Provider, MD  Ipratropium-Albuterol (COMBIVENT RESPIMAT) 20-100 MCG/ACT AERS respimat Inhale 1 puff into the lungs every 6 (six) hours as needed for wheezing.     Yes Historical Provider, MD  ketotifen (ZADITOR) 0.025 % ophthalmic solution Place 2 drops into both eyes 2 (two) times daily.   Yes Historical Provider, MD  latanoprost (XALATAN) 0.005 % ophthalmic solution Place 1 drop into both eyes at bedtime.   Yes Historical Provider, MD  levothyroxine (SYNTHROID, LEVOTHROID) 25 MCG tablet Take 25 mcg by mouth daily before breakfast.   Yes Historical Provider, MD  lisinopril (PRINIVIL,ZESTRIL) 40 MG tablet Take 40 mg by mouth daily.   Yes Historical Provider, MD  metFORMIN (GLUCOPHAGE) 500 MG tablet Take 500 mg by mouth 2 (two) times daily with a meal.    Yes Historical Provider, MD  metoprolol (LOPRESSOR) 50 MG tablet Take 25 mg by mouth 2 (two) times daily.   Yes Historical Provider, MD  Multiple Vitamins-Minerals (MULTIVITAMIN WITH MINERALS) tablet Take 1 tablet by mouth daily.   Yes Historical Provider, MD  omeprazole (PRILOSEC) 20 MG capsule Take 20 mg by mouth 2 (two) times daily before a meal.   Yes Historical Provider, MD  pravastatin (PRAVACHOL) 40 MG tablet Take 40 mg by mouth daily.   Yes Historical Provider, MD  predniSONE (DELTASONE) 20 MG tablet Take 3 tablets (60 mg total) by mouth daily with breakfast. 05/17/15  Yes Shon Baton, MD  terazosin (HYTRIN) 10 MG capsule Take 10 mg by mouth at bedtime.   Yes Historical Provider, MD   Triage Vitals: BP 119/67 mmHg  Pulse 79  Temp(Src) 98.3 F (36.8 C) (Oral)  Resp 28  Ht 5\' 8"  (1.727 m)  Wt 160 lb (72.576 kg)  BMI 24.33 kg/m2  SpO2 98%   Physical Exam  Constitutional: He is oriented to person, place, and time. He appears well-developed and well-nourished. No distress.  HENT:  Head: Normocephalic and atraumatic.  Mouth/Throat: Oropharynx is clear and moist.  Eyes: EOM are normal. Pupils are equal, round, and reactive to light.  Neck: Normal range of motion. Neck supple.  Cardiovascular: Exam reveals no gallop and no friction rub.   No murmur heard. Irregularly irregular   Pulmonary/Chest: Effort normal and breath sounds normal. No respiratory distress. He has no wheezes. He has no rales. He exhibits tenderness (mild tenderness to palpation over the left chest. There is no crepitance or deformity.).  Abdominal: Soft. Bowel sounds are normal. He exhibits no distension and no mass. There is no tenderness. There is no rebound and no guarding.  Musculoskeletal: Normal range of motion. He exhibits no edema or tenderness.  No lower extremity swelling or tenderness. Distal pulses equal and intact.  Neurological: He is alert and oriented to person, place, and time.  5/5 motor in all extremities. Sensation is fully intact.  Skin: Skin is warm and dry. No rash noted. No erythema.  Psychiatric: He has a normal mood and affect. His behavior is normal.  Nursing note and vitals reviewed.   ED Course  Procedures (including critical care time)  DIAGNOSTIC STUDIES: Oxygen Saturation is 93% on RA, adequate by my interpretation.    COORDINATION OF CARE: 3:43 AM- Will give ASA. Will order CBC, CMP, urinalysis, and EKG. Discussed treatment plan with pt at bedside  and pt agreed to plan.     Labs Review Labs Reviewed  COMPREHENSIVE METABOLIC PANEL - Abnormal; Notable for the following:    Glucose, Bld 214 (*)    BUN 23 (*)    Creatinine, Ser 1.38 (*)    Total Protein 6.2 (*)    Albumin 3.3 (*)    ALT 12 (*)    GFR calc non Af Amer 45 (*)    GFR calc Af Amer 52 (*)    All other components within normal limits  URINALYSIS, ROUTINE W REFLEX MICROSCOPIC (NOT AT [facility]) - Abnormal; Notable for the following:    Color, Urine AMBER (*)    Glucose, UA 100 (*)    Bilirubin Urine SMALL (*)    Ketones, ur 40 (*)    All other components within normal limits  CBC WITH DIFFERENTIAL/PLATELET  Rosezena Sensor, ED    Imaging Review Dg Chest 2 View  05/20/2015  CLINICAL DATA:  Acute onset of left-sided chest pain and shortness of breath. Initial encounter. EXAM: CHEST  2 VIEW  COMPARISON:  Chest radiograph performed 05/17/2015 FINDINGS: The lungs are well-aerated. Peribronchial thickening is noted. Increased interstitial markings may reflect mild interstitial edema or possibly pneumonia. No pleural effusion or pneumothorax is seen. The heart is normal in size; the mediastinal contour is within normal limits. No acute osseous abnormalities are seen. IMPRESSION: Peribronchial thickening noted. Increased interstitial markings may reflect mild interstitial edema or possibly pneumonia. Electronically Signed   By: Roanna Raider M.D.   On: 05/20/2015 05:01   I have personally reviewed and evaluated these images and lab results as part of my medical decision-making.   EKG Interpretation   Date/Time:  Thursday May 20 2015 03:15:37 EST Ventricular Rate:  102 PR Interval:    QRS Duration: 133 QT Interval:  384 QTC Calculation: 500 R Axis:   -90 Text Interpretation:  Atrial fibrillation Right bundle branch block  Anterior infarct, old ST elevation, consider inferior injury Confirmed by  Ranae Palms  MD, Dyna Figuereo ([id number]) on 05/20/2015 4:18:32 AM      MDM   Final diagnoses:  Atrial fibrillation with RVR (HCC)  Chest pain, unspecified chest pain type  CAP (community acquired pneumonia)    I personally performed the services described in this documentation, which was scribed in my presence. The recorded information has been reviewed and is accurate.    Patient remains chest pain-free in the emergency department. Initial troponin is normal. Questionable early pneumonia on chest x-ray. Patient states he has had persistent shortness of breath and cough for the last few days. Afebrile in the emergency department. Patient has been on doxycycline. We'll start on Levaquin. Discussed with hospitalist and we'll admit to telemetry bed.   [name], MD 05/20/15 [phone]

## 2015-05-20 NOTE — ED Notes (Signed)
PER [facility]: Patient to ED from home c/o L sided CP that woke him up at 0230 this morning, accompanied by diaphoresis and worsened SOB (pt hx COPD, on 3L O2 via [place] at home). Patient took 3 NTG within 15 minute period, decreasing his pain from 9/10 to 3/10. Patient also took 324 ASA, now denies CP. Upon EMS arrival, HR was 150, EMS placed 20g. LFA and gave  Cardizem, lowering rate to between 90 and 110. Last EMS VS: 104/60, 93% on 3L O2, HR 100 A-Fib. Patient A&O x 4. Patient states he was here 3 days ago for the same type of CP, and was discharged home.

## 2015-05-20 NOTE — H&P (Signed)
[facility] History and Physical                                                                                    [name], is a 82 y.o. male  MRN: [id number]   DOB - 04-27-1927  Admit Date - 05/20/2015  [facility]  Referring MD: Ranae Palms / ER  Consulting M.D: [facility] / Cardiology  PMH: Past Medical History  Diagnosis Date  . CVA (cerebral infarction)   . COPD (chronic obstructive pulmonary disease) (HCC)   . Essential hypertension, benign   . Diabetes mellitus without complication (HCC)   . Atrial fibrillation (HCC)   . Hard of hearing       PSH: Past Surgical History  Procedure Laterality Date  . Hernia repair    . Left heart catheterization with coronary angiogram N/A 03/28/2013    Procedure: LEFT HEART CATHETERIZATION WITH CORONARY ANGIOGRAM;  Surgeon: Peter M Swaziland, MD;  Location: [facility] CATH LAB;  Service: Cardiovascular;  Laterality: N/A;     CC:  Chief Complaint  Patient presents with  . Chest Pain     HPI: 82 year old male patient with past medical history of stroke with residual chronic expressive aphasia, diabetes on metformin, COPD on chronic oxygen but noncompliant with oxygen, history of recurrent chest pain with nonobstructive cardiac catheterization in January 2015, hypertension, dyslipidemia, hypothyroidism who presented to the hospital after awakening with left anterior chest pain. He described this pain as being a level 8/10 This was associated with diaphoresis and shortness of breath. EMS was called to the home and the patient was given 3 nitroglycerin sublingual with improvement in pain to 2/10 and resolution of shortness of breath. Patient was found to be hypoxemic (he was not wearing his oxygen) and 3 L oxygen was applied. He was found to be in atrial fibrillation with ventricular rate 150 bpm so was given 10 mg of Cardizem. In addition he was given aspirin. He had been evaluated in our ER on 2/20 complaints of generalized  weakness and dysuria for 2-3 days with productive cough. He that time he denied fevers or body aches nausea vomiting or diarrhea. At that time it was noted that when he ambulated on room air his pulse oximetry dropped to 82%. It was suspected at that time patient's symptoms were coming from hypoxemia. He was also wheezing on exam. It was suspected he had acute COPD exacerbation with possible underlying bronchitis so he was discharged with prescriptions for prednisone, and inhaler and doxycycline. He was encouraged by the EDP to use his home oxygen.  Upon further questioning today patient's son revealed that patient did not fill any of those prescriptions. In further discussion with the patient, although he does have oxygen available at home he typically does not use this regularly. He reports a chronic history of exertional chest pain while not wearing oxygen. Patient frequently takes sublingual nitroglycerin for the chest pain. He does not have any awareness of palpitations. He does report that over the past 7 days he has had productive cough (worse than baseline) with nocturnal subjective fevers and chills. His son stated he witnessed patient having rigors. There  is a family member that has recently been diagnosed with the flu that the patient has had close contact with. Son is concerned that patient needs assistance with medication administration despite having another son and daughter-in-law as well as wife living in the home.  ER Evaluation and treatment: Afebrile, initial BP 134/66, pulse 86, respirations 22, O2 saturations 96% on 3 L  EKG: Atrial fibrillation with ventricular response 102 bpm, QTC 500 ms in setting of underlying right bundle branch block, peaked T waves in inferolateral leads although difficult to elucidate in setting of right bundle branch block. Most recent EKG was performed on 2/20 that showed right bundle branch block with left anterior fascicular block with similar T-wave ST  segment elevation pattern but underlying sinus rhythm. 2 View CXR: Peribronchial thickening with increased interstitial markings either reflective of interstitial edema which is mild or possible pneumonia Laboratory data: Na 139, K 4.4, BUN 23, Cr 1.23, glucose 214, troponin 0.00, WBCs 9600 with neutrophils 74% and absolute neutrophils 7.1%, platelets 179,000, urinalysis with small amount of bilirubin, amber color, 100 of glucose, 40 ketones, urine specific gravity 1.017  Review of Systems   In addition to the HPI above,  No Headache, changes with Vision or hearing, new weakness, tingling, numbness in any extremity, No problems swallowing food or Liquids, indigestion/reflux No palpitations, orthopnea  No Abdominal pain, N/V; no melena or hematochezia, no dark tarry stools No dysuria, hematuria or flank pain No new skin rashes, lesions, masses or bruises, No new joints pains-aches No recent weight gain or loss No polyuria, polydypsia or polyphagia,  *A full 10 point Review of Systems was done, except as stated above, all other Review of Systems were negative.  Social History Social History  Substance Use Topics  . Smoking status: Former Smoker    Types: Cigarettes  . Smokeless tobacco: Former Neurosurgeon  . Alcohol Use: No    Resides at: Private residence  Lives with: Spouse as well as son and daughter-in-law  Ambulatory status: Without assistive devices   Family History History reviewed. No pertinent family history. Patient unable to recall if parents or siblings with significant diseases such as high blood pressure, coronary artery disease or history of stroke   Prior to Admission medications   Medication Sig Start Date End Date Taking? Authorizing Provider  albuterol (PROVENTIL HFA;VENTOLIN HFA) 108 (90 Base) MCG/ACT inhaler Inhale 2 puffs into the lungs every 4 (four) hours as needed for wheezing or shortness of breath. 05/17/15  Yes Shon Baton, MD  amLODipine (NORVASC) 5  MG tablet Take 2.5 mg by mouth daily.   Yes Historical Provider, MD  aspirin EC 81 MG EC tablet Take 1 tablet (81 mg total) by mouth daily. 03/29/13  Yes Maryann Mikhail, DO  budesonide-formoterol (SYMBICORT) 80-4.5 MCG/ACT inhaler Inhale 2 puffs into the lungs 2 (two) times daily.   Yes Historical Provider, MD  doxycycline (VIBRAMYCIN) 100 MG capsule Take 1 capsule (100 mg total) by mouth 2 (two) times daily. 05/17/15  Yes Shon Baton, MD  finasteride (PROSCAR) 5 MG tablet Take 5 mg by mouth daily.   Yes Historical Provider, MD  hydrochlorothiazide (HYDRODIURIL) 25 MG tablet Take 12.5 mg by mouth daily.   Yes Historical Provider, MD  Ipratropium-Albuterol (COMBIVENT RESPIMAT) 20-100 MCG/ACT AERS respimat Inhale 1 puff into the lungs every 6 (six) hours as needed for wheezing.    Yes Historical Provider, MD  ketotifen (ZADITOR) 0.025 % ophthalmic solution Place 2 drops into both eyes 2 (two) times  daily.   Yes Historical Provider, MD  latanoprost (XALATAN) 0.005 % ophthalmic solution Place 1 drop into both eyes at bedtime.   Yes Historical Provider, MD  levothyroxine (SYNTHROID, LEVOTHROID) 25 MCG tablet Take 25 mcg by mouth daily before breakfast.   Yes Historical Provider, MD  lisinopril (PRINIVIL,ZESTRIL) 40 MG tablet Take 40 mg by mouth daily.   Yes Historical Provider, MD  metFORMIN (GLUCOPHAGE) 500 MG tablet Take 500 mg by mouth 2 (two) times daily with a meal.    Yes Historical Provider, MD  metoprolol (LOPRESSOR) 50 MG tablet Take 25 mg by mouth 2 (two) times daily.   Yes Historical Provider, MD  Multiple Vitamins-Minerals (MULTIVITAMIN WITH MINERALS) tablet Take 1 tablet by mouth daily.   Yes Historical Provider, MD  omeprazole (PRILOSEC) 20 MG capsule Take 20 mg by mouth 2 (two) times daily before a meal.   Yes Historical Provider, MD  pravastatin (PRAVACHOL) 40 MG tablet Take 40 mg by mouth daily.   Yes Historical Provider, MD  predniSONE (DELTASONE) 20 MG tablet Take 3 tablets (60 mg  total) by mouth daily with breakfast. 05/17/15  Yes Shon Baton, MD  terazosin (HYTRIN) 10 MG capsule Take 10 mg by mouth at bedtime.   Yes Historical Provider, MD    No Known Allergies  Physical Exam  Vitals  Blood pressure 126/70, pulse 95, temperature 98.3 F (36.8 C), temperature source Oral, resp. rate 14, height  (1.727 m), weight 160 lb (72.576 kg), SpO2 99 %.   General:  In no acute distress, appears younger than stated age  Psych:  Normal affect, Denies Suicidal or Homicidal ideations, Awake Alert, Oriented X 3.   Neuro:   No focal neurological deficits, CN II through XII intact, Strength 5/5 all 4 extremities, Sensation intact all 4 extremities.  ENT:  Ears and Eyes appear Normal, Conjunctivae clear, PER. Moist oral mucosa without erythema or exudates.  Neck:  Supple, No lymphadenopathy appreciated  Respiratory:  Symmetrical chest wall movement, diminished air movement bilaterally, fine by basilar crackles with scattered expiratory wheezing. Liters  Cardiac: Irregular with underlying atrial fibrillation, No Murmurs, no LE edema noted, no JVD, No carotid bruits, peripheral pulses palpable at 2+  Abdomen:  Positive bowel sounds, Soft, Non tender, Non distended,  No masses appreciated, no obvious hepatosplenomegaly  Skin:  No Cyanosis, Normal Skin Turgor, No Skin Rash or Bruise.  Extremities: Symmetrical without obvious trauma or injury,  no effusions.  Data Review  CBC  Recent Labs Lab 05/17/15 0030 05/20/15 0339  WBC 9.7 9.6  HGB 12.8* 13.1  HCT 41.0 40.1  PLT 166 179  MCV 89.1 89.3  MCH 27.8 29.2  MCHC 31.2 32.7  RDW 13.5 13.5  LYMPHSABS 0.8 1.3  MONOABS 0.7 1.0  EOSABS 0.2 0.1  BASOSABS 0.0 0.0    Chemistries   Recent Labs Lab 05/17/15 0030 05/20/15 0339  NA 139 139  K 4.6 4.4  CL 103 101  CO2 25 25  GLUCOSE 148* 214*  BUN 12 23*  CREATININE 1.14 1.38*  CALCIUM 9.3 9.0  AST  --  15  ALT  --  12*  ALKPHOS  --  42  BILITOT   --  1.1    estimated creatinine clearance is 37.9 mL/min (by C-G formula based on Cr of 1.38).  No results for input(s): TSH, T4TOTAL, T3FREE, THYROIDAB in the last 72 hours.  Invalid input(s): FREET3  Coagulation profile No results for input(s): INR, PROTIME in the last 168  hours.  No results for input(s): DDIMER in the last 72 hours.  Cardiac Enzymes No results for input(s): CKMB, TROPONINI, MYOGLOBIN in the last 168 hours.  Invalid input(s): CK  Invalid input(s): POCBNP  Urinalysis    Component Value Date/Time   COLORURINE AMBER* 05/20/2015 0450   APPEARANCEUR CLEAR 05/20/2015 0450   LABSPEC 1.017 05/20/2015 0450   PHURINE 5.5 05/20/2015 0450   GLUCOSEU 100* 05/20/2015 0450   HGBUR NEGATIVE 05/20/2015 0450   BILIRUBINUR SMALL* 05/20/2015 0450   KETONESUR 40* 05/20/2015 0450   PROTEINUR NEGATIVE 05/20/2015 0450   UROBILINOGEN 1.0 10/28/2014 1628   NITRITE NEGATIVE 05/20/2015 0450   LEUKOCYTESUR NEGATIVE 05/20/2015 0450    Imaging results:   Dg Chest 2 View  05/20/2015  CLINICAL DATA:  Acute onset of left-sided chest pain and shortness of breath. Initial encounter. EXAM: CHEST  2 VIEW COMPARISON:  Chest radiograph performed 05/17/2015 FINDINGS: The lungs are well-aerated. Peribronchial thickening is noted. Increased interstitial markings may reflect mild interstitial edema or possibly pneumonia. No pleural effusion or pneumothorax is seen. The heart is normal in size; the mediastinal contour is within normal limits. No acute osseous abnormalities are seen. IMPRESSION: Peribronchial thickening noted. Increased interstitial markings may reflect mild interstitial edema or possibly pneumonia. Electronically Signed   By: Roanna Raider M.D.   On: 05/20/2015 05:01   Dg Chest 2 View  05/17/2015  CLINICAL DATA:  Acute onset of cough and congestion. Generalized chest pain and shortness of breath. Initial encounter. EXAM: CHEST  2 VIEW COMPARISON:  Chest radiograph performed  10/29/2014 FINDINGS: The lungs are well-aerated. Peribronchial thickening is noted. Mild bibasilar atelectasis is seen, and chronically increased interstitial markings are noted. There is no evidence of pleural effusion or pneumothorax. The heart is normal in size; the mediastinal contour is within normal limits. No acute osseous abnormalities are seen. IMPRESSION: Peribronchial thickening noted. Mild bibasilar atelectasis seen. Increased interstitial markings noted. Electronically Signed   By: Roanna Raider M.D.   On: 05/17/2015 06:22     EKG: (Independently reviewed)  Atrial fibrillation with ventricular response 102 bpm, QTC 500 ms in setting of underlying right bundle branch block, peaked T waves in inferolateral leads although difficult to elucidate in setting of right bundle branch block. Most recent EKG was performed on 2/20 that showed right bundle branch block with left anterior fascicular block with similar T-wave ST segment elevation pattern but underlying sinus rhythm.   Assessment & Plan  Principal Problem:   New onset atrial fibrillation/RVR -Suspect this may be contributing to patient's acute chest pain -Currently rate is controlled -Admit to telemetry/Obs -Consult cardiology -CHADVASC = 6 so begin full dose anticoagulation-pharmacy has been consulted for Lovenox -Continue preadmission Lopressor -Suspect precipitating factor is acute on chronic hypoxemia in setting of combined COPD exacerbation with either acute bronchitis or possible influenza and patient not utilizing home oxygen as prescribed  Active Problems:   Acute on chronic respiratory failure with hypoxia/COPD exacerbation/Acute bronchitis -Patient was clearly demonstrated to have chronic hypoxemia during previous ER visit on 2/20 and reports noncompliance with oxygen therapies at home therefore this is contributing factor to ongoing hypoxemia -Patient reports increasing productive cough with subjective fevers, chills  and rigors in the past week so likely has infectious component; also has been exposed to influenza -Treat suspected bronchitis with doxycycline (patient was given prescription but never filled on 2/20) -Low-dose IV Solu-Medrol for COPD exacerbation component, DuoNeb, and continue home MDI -Check influenza PCR and begin empiric Tamiflu -Flutter valve -  I have emphasized to the patient that he must utilize his oxygen on a more regular basis most especially when active and when sleeping -Less likely CHF exacerbation but will check BNP -Have consulted case management to assist in issues related to compliance at home/need for some type of post hospital follow-up by RN and/or respiratory therapy    Chest pain syndrome/nonobstructive CAD -Patient has typical chest pain symptoms located in the left anterior chest with associated diaphoresis and shortness of breath that is resolved with administration of nitroglycerin/ASA/O2 -Patient was found to have nonobstructive CAD on catheterization in 2015: Stenosis varied from 20% to 40% (involving the distal left circumflex) -Cycle troponin -Echo -Continue beta blocker and statin as well as aspirin (81 mg) -Cardiology is been consulted as above for new onset atrial fibrillation      Diabetes mellitus type II, uncontrolled  -CBG greater than 200 -On metformin at home but on hold now due to AKI -History of running out of diabetes supplies/? other meds which are mailed [facility] -Check hemoglobin A1c -SSI -Diabetes educator consultation    HTN  -Blood pressure controlled -Continuing beta blocker in setting of known nonobstructive CAD as well as new onset atrial fibrillation (rate controlled) -Preadmission diuretic as well as ACE inhibitor on hold secondary to AKI    AKI  -Appears to be related to acute respiratory illness and mild hyperglycemia in setting of concomitant use of diuretic, ACE inhibitor and metformin  -Offending offending medications  as above  -Follow labs     Hypothyroidism -Continue Synthroid  -Check TSH     RBBB/ Prolonged Q-T interval on ECG -Suspect slightly prolonged QT related to chronic right bundle branch block  -Suspect right bundle branch block reflective of cardiac remodeling in setting of known COPD i.e. likely has cor pulmonale and at least right atrial enlargement/ potentially RV dilatation  -Repeat EKG in a.m.    History of CVA -Patient reports has history of chronic recurrent mild expressive aphasia -Continue preadmission aspirin and statin   DVT Prophylaxis: full dose Lovenox  Family Communication:   Son at bedside  Code Status:  Full code  Condition:  Stable  Discharge disposition: anticipate discharge back to previous home environment-suspect will need some type of home health services regarding assisting patient with medication administration and confirming he is utilizing oxygen appropriately (RN and respiratory therapy)  Time spent in minutes : 60      ELLIS,ALLISON L. ANP on 05/20/2015 at 8:05 AM  You may contact me by going to [email or web address] - password [email or web address]  I am available from 7a-7p but please confirm I am on the schedule by going to Amion as above.   After 7p please contact night coverage person covering me after hours  [facility]

## 2015-05-20 NOTE — Consult Note (Signed)
CARDIOLOGY CONSULT NOTE   Patient ID: [name] MRN: [id number] DOB/AGE: Apr 02, 1927 82 y.o.  Admit date: 05/20/2015  Primary Physician   [facility] Primary Cardiologist   Dr. Simona Huh Reason for Consultation   Chest pain, elevated troponin  HPI:[name] is a 82 year old male with past medical history of CVA approx. 10 years ago with minimal expressive aphasia residual, COPD, HTN, non insulin dependent DM. Last cath was Jan 2015 - LAD with 30% stenosis at first diagonal take off, 30% proximal first diagonal, RCA less than 20%, Ramus 20-30% proximal. No obstructive CAD.    This morning around midnight he had 8/10 chest pain over left side that continued for 2 hours. No radiation. He was diaphoretic, clammy and SOB. He took 3 nitro SL without relief. EMS was called around 2 am. He was found to be in Afib RVR with rate of 150 and was given 10mg  Cardizem and ASA.  He was hypoxic and had wheezing with O2 saturations of 82% when ambulating on room air in ED.He was in the ED three days ago on 05/17/15 for COPD exacerbation and was given prednisone, Doxycycline, and an inhaler, none of which he has filled or taken.    He still reports having chest pressure but not "anything like it was this morning".  He is living with his brother who is currently admitted at [facility] with positive flu. Patient's flu test pending.  His troponin is slightly elevated at 0.06.  EKG shows Afib with rate control at 65 with RBBB.  He has history of RBBB.    Past Medical History  Diagnosis Date  . CVA (cerebral infarction)   . COPD (chronic obstructive pulmonary disease) (HCC)   . Essential hypertension, benign   . Diabetes mellitus without complication (HCC)   . Atrial fibrillation (HCC)   . Hard of hearing      Past Surgical History  Procedure Laterality Date  . Hernia repair    . Left heart catheterization with coronary angiogram N/A 03/28/2013    Procedure: LEFT HEART CATHETERIZATION WITH CORONARY  ANGIOGRAM;  Surgeon: Peter M Swaziland, MD;  Location: [facility] CATH LAB;  Service: Cardiovascular;  Laterality: N/A;    No Known Allergies  I have reviewed the patient's current medications . amLODipine  2.5 mg Oral Daily  . aspirin  324 mg Oral Once  . aspirin EC  81 mg Oral Daily  . budesonide-formoterol  2 puff Inhalation BID  . doxycycline  100 mg Oral Q12H  . enoxaparin (LOVENOX) injection  70 mg Subcutaneous Q12H  . finasteride  5 mg Oral Daily  . insulin aspart  0-5 Units Subcutaneous QHS  . insulin aspart  0-9 Units Subcutaneous TID WC  . ipratropium-albuterol  3 mL Nebulization Q6H  . ketotifen  2 drop Both Eyes BID  . latanoprost  1 drop Both Eyes QHS  . levothyroxine  25 mcg Oral QAC breakfast  . methylPREDNISolone (SOLU-MEDROL) injection  60 mg Intravenous Q12H  . metoprolol  25 mg Oral BID  . multivitamin with minerals  1 tablet Oral Daily  . oseltamivir  75 mg Oral BID       Medication Sig  albuterol (PROVENTIL HFA;VENTOLIN HFA) 108 (90 Base) MCG/ACT inhaler Inhale 2 puffs into the lungs every 4 (four) hours as needed for wheezing or shortness of breath.  amLODipine (NORVASC) 5 MG tablet Take 2.5 mg by mouth daily.  aspirin EC 81 MG EC tablet Take 1 tablet (  81 mg total) by mouth daily.  budesonide-formoterol (SYMBICORT) 80-4.5 MCG/ACT inhaler Inhale 2 puffs into the lungs 2 (two) times daily.  doxycycline (VIBRAMYCIN) 100 MG capsule Take 1 capsule (100 mg total) by mouth 2 (two) times daily.  finasteride (PROSCAR) 5 MG tablet Take 5 mg by mouth daily.  hydrochlorothiazide (HYDRODIURIL) 25 MG tablet Take 12.5 mg by mouth daily.  Ipratropium-Albuterol (COMBIVENT RESPIMAT) 20-100 MCG/ACT AERS respimat Inhale 1 puff into the lungs every 6 (six) hours as needed for wheezing.   ketotifen (ZADITOR) 0.025 % ophthalmic solution Place 2 drops into both eyes 2 (two) times daily.  latanoprost (XALATAN) 0.005 % ophthalmic solution Place 1 drop into both eyes at bedtime.  levothyroxine  (SYNTHROID, LEVOTHROID) 25 MCG tablet Take 25 mcg by mouth daily before breakfast.  lisinopril (PRINIVIL,ZESTRIL) 40 MG tablet Take 40 mg by mouth daily.  metFORMIN (GLUCOPHAGE) 500 MG tablet Take 500 mg by mouth 2 (two) times daily with a meal.   metoprolol (LOPRESSOR) 50 MG tablet Take 25 mg by mouth 2 (two) times daily.  Multiple Vitamins-Minerals (MULTIVITAMIN WITH MINERALS) tablet Take 1 tablet by mouth daily.  omeprazole (PRILOSEC) 20 MG capsule Take 20 mg by mouth 2 (two) times daily before a meal.  pravastatin (PRAVACHOL) 40 MG tablet Take 40 mg by mouth daily.  predniSONE (DELTASONE) 20 MG tablet Take 3 tablets (60 mg total) by mouth daily with breakfast.  terazosin (HYTRIN) 10 MG capsule Take 10 mg by mouth at bedtime.     Social History   Social History  . Marital Status: Married    Spouse Name: N/A  . Number of Children: N/A  . Years of Education: N/A   Occupational History  . Not on file.   Social History Main Topics  . Smoking status: Former Smoker    Types: Cigarettes  . Smokeless tobacco: Former Neurosurgeon  . Alcohol Use: No  . Drug Use: No  . Sexual Activity: Not on file   Other Topics Concern  . Not on file   Social History Narrative    No family status information on file.   History reviewed. No pertinent family history.   ROS:  Full 14 point review of systems complete and found to be negative unless listed above.  Physical Exam: Blood pressure 104/62, pulse 57, temperature 98.3 F (36.8 C), temperature source Oral, resp. rate 29, height  (1.727 m), weight 160 lb (72.576 kg), SpO2 94 %.  General: Well developed, well nourished, male in no acute distress Head: Eyes PERRLA, No xanthomas.   Normocephalic and atraumatic, oropharynx without edema or exudate. Dentition: poor Lungs: Inspiratory and expiratory wheezes in BUL.  Diminished in bases.  Heart: Heart irregular rate and rhythm with S1, S2  murmur. pulses are 2+ extrem.   Neck: No carotid bruits. No  lymphadenopathy.  No JVD Abdomen: Bowel sounds present, abdomen soft and non-tender without masses or hernias noted. Msk:  No spine or cva tenderness. No weakness, no joint deformities or effusions. Extremities: No clubbing or cyanosis.  edema.  Neuro: Alert and oriented X 3. Mild expressive aphasia, delayed responses.  Psych:  Good affect Skin: No rashes or lesions noted.  Labs:   Lab Results  Component Value Date   WBC 9.6 05/20/2015   HGB 13.1 05/20/2015   HCT 40.1 05/20/2015   MCV 89.3 05/20/2015   PLT 179 05/20/2015    Recent Labs Lab 05/20/15 0339  NA 139  K 4.4  CL 101  CO2 25  BUN  23*  CREATININE 1.38*  CALCIUM 9.0  PROT 6.2*  BILITOT 1.1  ALKPHOS 42  ALT 12*  AST 15  GLUCOSE 214*  ALBUMIN 3.3*    Recent Labs  05/20/15 0802  TROPONINI 0.06*    B NATRIURETIC PEPTIDE  Date/Time Value Ref Range Status  05/20/2015 08:04 AM 124.7* 0.0 - 100.0 pg/mL Final     Echo: Pending  ECG: Afib with RBBB   Radiology:  Dg Chest 2 View  05/20/2015  CLINICAL DATA:  Acute onset of left-sided chest pain and shortness of breath. Initial encounter. EXAM: CHEST  2 VIEW COMPARISON:  Chest radiograph performed 05/17/2015 FINDINGS: The lungs are well-aerated. Peribronchial thickening is noted. Increased interstitial markings may reflect mild interstitial edema or possibly pneumonia. No pleural effusion or pneumothorax is seen. The heart is normal in size; the mediastinal contour is within normal limits. No acute osseous abnormalities are seen. IMPRESSION: Peribronchial thickening noted. Increased interstitial markings may reflect mild interstitial edema or possibly pneumonia. Electronically Signed   By: Roanna Raider M.D.   On: 05/20/2015 05:01    ASSESSMENT AND PLAN:    Principal Problem:   New onset atrial fibrillation/RVR Active Problems:   COPD exacerbation (HCC)   Diabetes mellitus type II, uncontrolled (HCC)   Hypothyroidism   HTN (hypertension)   Acute on  chronic respiratory failure with hypoxia (HCC)   Chest pain syndrome   Acute bronchitis   AKI (acute kidney injury) (HCC)   RBBB   Prolonged Q-T interval on ECG   CAD (coronary artery disease)   History of CVA (cerebrovascular accident)  1. Elevated troponin Cycle enzymes Echo pending Still experiencing chest pressure. No SOB or diaphoresis at this time.  Has resting angina, would benefit from cath.   2. New onset Afib Rate controlled with  bolus Cardizem.  No gtt.  Continue home dose of metoprolol.  On full dose Lovenox per Pharmacy.  Will get Echo to evaluate valve function This patients CHA2DS2-VASc Score = 5.   3. COPD exacerbation Continue home inhalers.  Flu negative Most likely contributed to his new onset Afib  Signed: Little Ishikawa, NP 05/20/2015 10:58 AM  Co-Sign MD   The patient was seen, examined and discussed with Little Ishikawa, NP and I agree with the above.   A very pleasant 82 year old male with prior medical history of CVA, COPD secondary diabetes, known nonobstructive coronary artery disease per cardiac catheterization in January 2015 with maximal 30% stenosis in LAD. The patient presented to the ER on February 20 with acute on chronic COPD exacerbation and was started on steroids and doxycycline. This morning he developed chest pain and called EMS and was found to be in A. fib with RVR that is now rate controlled after 1 dose of IV Cardizem 10 mg. He is currently chest pain-free however hypoxic with ambulation down to 82%. This is first episode of atrial fibrillation for this patient, and is most probably triggered by his upper respiratory infection, his brother who lives with him and his household has acute flu. There is a minimal troponin elevation of 0.06 that can most probably sec to A. fib with RVR, we will continue cycling troponins and unless significant elevation we will not plan on cardiac cath since nonobstructive just 2 years ago. The patient is  already fully anticoagulated, his CHADS-VASc score is 5, he will require anticoagulation, ideally NOAC if he doesn't have a valvular heart disease. We will check his echocardiogram and decide based on results.  We will plan on a follow-up in our clinic and if he is still an atrial fibrillation will plan for an outpatient cardioversion after 4 weeks of full anticoagulation.  Lars Masson, MD  05/20/2015

## 2015-05-20 NOTE — Progress Notes (Signed)
Pharmacy may adjust abx as needed for renal fxn. On doxycyline (continued from home) for bronchitis - not renally cleared On tamiflu - Flu negative. Rec: DC tamiflu  Herby Abraham, Pharm.D. [phone] 05/20/2015 8:05 PM

## 2015-05-21 ENCOUNTER — Observation Stay (HOSPITAL_COMMUNITY): Payer: Medicare Other

## 2015-05-21 ENCOUNTER — Observation Stay (HOSPITAL_BASED_OUTPATIENT_CLINIC_OR_DEPARTMENT_OTHER): Payer: Medicare Other

## 2015-05-21 DIAGNOSIS — Z9119 Patient's noncompliance with other medical treatment and regimen: Secondary | ICD-10-CM | POA: Diagnosis not present

## 2015-05-21 DIAGNOSIS — I4510 Unspecified right bundle-branch block: Secondary | ICD-10-CM | POA: Diagnosis present

## 2015-05-21 DIAGNOSIS — Z7952 Long term (current) use of systemic steroids: Secondary | ICD-10-CM | POA: Diagnosis not present

## 2015-05-21 DIAGNOSIS — I25119 Atherosclerotic heart disease of native coronary artery with unspecified angina pectoris: Secondary | ICD-10-CM | POA: Diagnosis present

## 2015-05-21 DIAGNOSIS — J209 Acute bronchitis, unspecified: Secondary | ICD-10-CM | POA: Diagnosis present

## 2015-05-21 DIAGNOSIS — E785 Hyperlipidemia, unspecified: Secondary | ICD-10-CM | POA: Diagnosis present

## 2015-05-21 DIAGNOSIS — J441 Chronic obstructive pulmonary disease with (acute) exacerbation: Secondary | ICD-10-CM | POA: Diagnosis present

## 2015-05-21 DIAGNOSIS — Z8673 Personal history of transient ischemic attack (TIA), and cerebral infarction without residual deficits: Secondary | ICD-10-CM

## 2015-05-21 DIAGNOSIS — Z792 Long term (current) use of antibiotics: Secondary | ICD-10-CM | POA: Diagnosis not present

## 2015-05-21 DIAGNOSIS — I69320 Aphasia following cerebral infarction: Secondary | ICD-10-CM | POA: Diagnosis not present

## 2015-05-21 DIAGNOSIS — I4891 Unspecified atrial fibrillation: Secondary | ICD-10-CM

## 2015-05-21 DIAGNOSIS — J208 Acute bronchitis due to other specified organisms: Secondary | ICD-10-CM | POA: Diagnosis not present

## 2015-05-21 DIAGNOSIS — Z7982 Long term (current) use of aspirin: Secondary | ICD-10-CM | POA: Diagnosis not present

## 2015-05-21 DIAGNOSIS — J440 Chronic obstructive pulmonary disease with acute lower respiratory infection: Secondary | ICD-10-CM | POA: Diagnosis present

## 2015-05-21 DIAGNOSIS — J189 Pneumonia, unspecified organism: Secondary | ICD-10-CM | POA: Diagnosis present

## 2015-05-21 DIAGNOSIS — E1165 Type 2 diabetes mellitus with hyperglycemia: Secondary | ICD-10-CM | POA: Diagnosis present

## 2015-05-21 DIAGNOSIS — N179 Acute kidney failure, unspecified: Secondary | ICD-10-CM | POA: Diagnosis present

## 2015-05-21 DIAGNOSIS — I10 Essential (primary) hypertension: Secondary | ICD-10-CM

## 2015-05-21 DIAGNOSIS — Z7984 Long term (current) use of oral hypoglycemic drugs: Secondary | ICD-10-CM | POA: Diagnosis not present

## 2015-05-21 DIAGNOSIS — I2510 Atherosclerotic heart disease of native coronary artery without angina pectoris: Secondary | ICD-10-CM | POA: Diagnosis not present

## 2015-05-21 DIAGNOSIS — Z87891 Personal history of nicotine dependence: Secondary | ICD-10-CM | POA: Diagnosis not present

## 2015-05-21 DIAGNOSIS — H9190 Unspecified hearing loss, unspecified ear: Secondary | ICD-10-CM | POA: Diagnosis present

## 2015-05-21 DIAGNOSIS — R079 Chest pain, unspecified: Secondary | ICD-10-CM | POA: Diagnosis not present

## 2015-05-21 DIAGNOSIS — J9621 Acute and chronic respiratory failure with hypoxia: Secondary | ICD-10-CM | POA: Diagnosis present

## 2015-05-21 DIAGNOSIS — E039 Hypothyroidism, unspecified: Secondary | ICD-10-CM | POA: Diagnosis present

## 2015-05-21 LAB — CBC
HEMATOCRIT: 38.4 % — AB (ref 39.0–52.0)
HEMOGLOBIN: 12.8 g/dL — AB (ref 13.0–17.0)
MCH: 29.4 pg (ref 26.0–34.0)
MCHC: 33.3 g/dL (ref 30.0–36.0)
MCV: 88.3 fL (ref 78.0–100.0)
Platelets: 200 10*3/uL (ref 150–400)
RBC: 4.35 MIL/uL (ref 4.22–5.81)
RDW: 13.1 % (ref 11.5–15.5)
WBC: 7.3 10*3/uL (ref 4.0–10.5)

## 2015-05-21 LAB — HIV ANTIBODY (ROUTINE TESTING W REFLEX): HIV SCREEN 4TH GENERATION: NONREACTIVE

## 2015-05-21 LAB — BASIC METABOLIC PANEL
Anion gap: 8 (ref 5–15)
BUN: 27 mg/dL — AB (ref 6–20)
CHLORIDE: 104 mmol/L (ref 101–111)
CO2: 24 mmol/L (ref 22–32)
CREATININE: 1.19 mg/dL (ref 0.61–1.24)
Calcium: 9.3 mg/dL (ref 8.9–10.3)
GFR calc Af Amer: >60 mL/min (ref >60)
GFR calc non Af Amer: 54 mL/min — ABNORMAL LOW (ref >60)
Glucose, Bld: 251 mg/dL — ABNORMAL HIGH (ref 65–99)
Potassium: 4.3 mmol/L (ref 3.5–5.1)
SODIUM: 136 mmol/L (ref 135–145)

## 2015-05-21 LAB — HEMOGLOBIN A1C
HEMOGLOBIN A1C: 7.5 % — AB (ref 4.8–5.6)
Mean Plasma Glucose: 169 mg/dL

## 2015-05-21 LAB — GLUCOSE, CAPILLARY
GLUCOSE-CAPILLARY: 183 mg/dL — AB (ref 65–99)
Glucose-Capillary: 217 mg/dL — ABNORMAL HIGH (ref 65–99)
Glucose-Capillary: 261 mg/dL — ABNORMAL HIGH (ref 65–99)
Glucose-Capillary: 274 mg/dL — ABNORMAL HIGH (ref 65–99)
Glucose-Capillary: 230 mg/dL — ABNORMAL HIGH (ref 65–99)

## 2015-05-21 LAB — LIPID PANEL
Cholesterol: 131 mg/dL (ref 0–200)
HDL: 35 mg/dL — ABNORMAL LOW (ref >40)
LDL CALC: 71 mg/dL (ref 0–99)
Total CHOL/HDL Ratio: 3.7 RATIO
Triglycerides: 123 mg/dL (ref <150)
VLDL: 25 mg/dL (ref 0–40)

## 2015-05-21 LAB — STREP PNEUMONIAE URINARY ANTIGEN: Strep Pneumo Urinary Antigen: NEGATIVE

## 2015-05-21 LAB — TSH: TSH: 1.141 u[IU]/mL (ref 0.350–4.500)

## 2015-05-21 MED ORDER — IPRATROPIUM-ALBUTEROL 0.5-2.5 (3) MG/3ML IN SOLN
3.0000 mL | Freq: Three times a day (TID) | RESPIRATORY_TRACT | Status: DC
  Administered 2015-05-21 (×2): 3 mL via RESPIRATORY_TRACT
  Filled 2015-05-21 (×4): qty 3

## 2015-05-21 MED ORDER — ALBUTEROL SULFATE (2.5 MG/3ML) 0.083% IN NEBU: 2.5000 mg | INHALATION_SOLUTION | RESPIRATORY_TRACT | Status: DC | PRN

## 2015-05-21 MED ORDER — METHYLPREDNISOLONE SODIUM SUCC 125 MG IJ SOLR
60.0000 mg | INTRAMUSCULAR | Status: DC
  Administered 2015-05-22: 60 mg via INTRAVENOUS
  Filled 2015-05-21: qty 2

## 2015-05-21 MED ORDER — NITROGLYCERIN 0.4 MG SL SUBL: 0.4000 mg | SUBLINGUAL_TABLET | SUBLINGUAL | Status: DC | PRN

## 2015-05-21 NOTE — Progress Notes (Signed)
Inpatient Diabetes Program Recommendations  AACE/ADA: New Consensus Statement on Inpatient Glycemic Control (2015)  Target Ranges:  Prepandial:   less than 140 mg/dL      Peak postprandial:   less than 180 mg/dL (1-2 hours)      Critically ill patients:  140 - 180 mg/dL   Results for [name], [name] (MRN [id number]) as of 05/21/2015 13:18  Ref. Range 05/20/2015 08:46 05/20/2015 13:50 05/20/2015 19:47 05/20/2015 23:07  Glucose-Capillary Latest Ref Range: 65-99 mg/dL 782 (H) 956 (H) 213 (H) 276 (H)    Admit with: CP  History: DM  Home DM Meds: Metformin 500 mg bid  Current Insulin Orders: Novolog Sensitive Correction Scale/ SSI (0-9 units) TID AC + HS     -Patient currently getting IV Solumedrol 60 mg daily (reduced dose this AM).  -Patient having some issues with postprandial glucose elevations.  -Eating 100% of meals.    MD- Please consider starting low dose Novolog meal coverage while patient getting IV steroids and home Metformin on hold:  Novolog 3 units tidwc   Addendum 1300: Spoke with patient about his DM care regimen at home.  Patient stated he checks his CBGs once daily at home.  Gets his medications and CBG meter supplies through Mail Order through the [place].  [facility] [place].  Per patient, cousin drives him to MD appointments and his wife is able to purchase groceries for the home.  No issues taking medications at home.  Reviewed patient's current A1c of 7.5% with him.  Explained what an A1c is and what it measures.  Encouraged patient to continue to check CBGs at home and to take CBG log book with him to all appointments.     --Will follow patient during hospitalization--  Ambrose Finland RN, MSN, CDE Diabetes Coordinator Inpatient Glycemic Control Team Team Pager: [phone] (8a-5p)

## 2015-05-21 NOTE — Progress Notes (Addendum)
Echocardiogram 2D Echocardiogram has been performed.  Dorothey Baseman 05/21/2015, 11:34 AM

## 2015-05-21 NOTE — Progress Notes (Addendum)
The client had some chest pain 5 out of 10 (see flow sheet) Blood pressure was 130/41 and an EKG was taken. I called Lynch with [facility] about pain, to look at the EKG, and low blood pressure because muiltple pressure medications were also due. She said to give lopressor and wait on the terazosin and see what the pressure did. The chest pain slowly decreasing with no nitro or pain medication.  Addendum:  Blood pressure now 126/36 held terazosin. The client is chest pain free. Will continue to monitor the client closely.

## 2015-05-21 NOTE — Care Management Obs Status (Signed)
MEDICARE OBSERVATION STATUS NOTIFICATION   Patient Details  Name: [name] MRN: [id number] Date of Birth: 1927-09-20   Medicare Observation Status Notification Given:  Yes    Gala Lewandowsky, RN 05/21/2015, 4:04 PM

## 2015-05-21 NOTE — Progress Notes (Signed)
Patient Name: [name] Date of Encounter: 05/21/2015  Principal Problem:   New onset atrial fibrillation/RVR Active Problems:   COPD exacerbation (HCC)   Diabetes mellitus type II, uncontrolled (HCC)   Hypothyroidism   HTN (hypertension)   Acute on chronic respiratory failure with hypoxia (HCC)   Chest pain syndrome   Acute bronchitis   AKI (acute kidney injury) (HCC)   RBBB   Prolonged Q-T interval on ECG   CAD (coronary artery disease)   History of CVA (cerebrovascular accident)   Length of Stay: 1  SUBJECTIVE  5/10 pressure like chest pain this morning that resolved with NTG.   CURRENT MEDS . amLODipine  2.5 mg Oral Daily  . aspirin  324 mg Oral Once  . aspirin EC  81 mg Oral Daily  . budesonide-formoterol  2 puff Inhalation BID  . doxycycline  100 mg Oral Q12H  . enoxaparin (LOVENOX) injection  70 mg Subcutaneous Q12H  . finasteride  5 mg Oral Daily  . insulin aspart  0-5 Units Subcutaneous QHS  . insulin aspart  0-9 Units Subcutaneous TID WC  . ipratropium-albuterol  3 mL Nebulization Q6H  . ketotifen  2 drop Both Eyes BID  . latanoprost  1 drop Both Eyes QHS  . levothyroxine  25 mcg Oral QAC breakfast  . methylPREDNISolone (SOLU-MEDROL) injection  60 mg Intravenous Q12H  . metoprolol  25 mg Oral BID  . multivitamin with minerals  1 tablet Oral Daily  . pantoprazole  40 mg Oral Daily  . pravastatin  40 mg Oral Daily  . terazosin  10 mg Oral QHS    OBJECTIVE  Filed Vitals:   05/20/15 1500 05/20/15 1940 05/21/15 0420 05/21/15 0900  BP: 120/67 148/56 130/49   Pulse: 83 65 68   Temp:  98.6 F (37 C) 98.6 F (37 C)   TempSrc:  Oral Oral   Resp: 24 19 19    Height:  5\' 8"  (1.727 m)    Weight:  155 lb 12.8 oz (70.67 kg) 156 lb 12.8 oz (71.124 kg)   SpO2: 96% 90% 91% 97%    Intake/Output Summary (Last 24 hours) at 05/21/15 0958 Last data filed at 05/20/15 2005  Gross per 24 hour  Intake    390 ml  Output    750 ml  Net   -360 ml   Filed  Weights   05/20/15 0323 05/20/15 1940 05/21/15 0420  Weight: 160 lb (72.576 kg) 155 lb 12.8 oz (70.67 kg) 156 lb 12.8 oz (71.124 kg)   PHYSICAL EXAM  General: Pleasant, NAD. Neuro: Alert and oriented X 3. Moves all extremities spontaneously. Psych: Normal affect. HEENT:  Normal  Neck: Supple without bruits or JVD. Lungs:  Resp regular and unlabored, CTA. Heart: RRR no s3, s4, or murmurs. Abdomen: Soft, non-tender, non-distended, BS + x 4.  Extremities: No clubbing, cyanosis or edema. DP/PT/Radials 2+ and equal bilaterally.  Accessory Clinical Findings  CBC  Recent Labs  05/20/15 0339 05/21/15 0458  WBC 9.6 7.3  NEUTROABS 7.1  --   HGB 13.1 12.8*  HCT 40.1 38.4*  MCV 89.3 88.3  PLT 179 200   Basic Metabolic Panel  Recent Labs  05/20/15 0339 05/21/15 0458  NA 139 136  K 4.4 4.3  CL 101 104  CO2 25 24  GLUCOSE 214* 251*  BUN 23* 27*  CREATININE 1.38* 1.19  CALCIUM 9.0 9.3   Liver Function Tests  Recent Labs  05/20/15 0339  AST 15  ALT 12*  ALKPHOS 42  BILITOT 1.1  PROT 6.2*  ALBUMIN 3.3*    Recent Labs  05/20/15 0802 05/20/15 1700 05/20/15 2020  TROPONINI 0.06* 0.05* 0.05*    Recent Labs  05/20/15 0804  HGBA1C 7.5*   Fasting Lipid Panel  Recent Labs  05/21/15 0458  CHOL 131  HDL 35*  LDLCALC 71  TRIG 696  CHOLHDL 3.7   Thyroid Function Tests  Recent Labs  05/20/15 0802  TSH 2.427   Radiology/Studies  Dg Chest 2 View  05/21/2015  CLINICAL DATA:  Acute on chronic respiratory failure EXAM: CHEST  2 VIEW COMPARISON:  05/20/2015 FINDINGS: Cardiomediastinal silhouette is stable. Persistent mild perihilar increased bronchial and interstitial markings without convincing pulmonary edema. Streaky bilateral basilar atelectasis or infiltrate left greater than right. Mild thoracic spine osteopenia again noted. IMPRESSION: Persistent mild perihilar increased bronchial and interstitial markings without convincing pulmonary edema. Streaky  bilateral basilar atelectasis or infiltrate left greater than right. Electronically Signed   By: Natasha Mead M.D.   On: 05/21/2015 08:11   Dg Chest 2 View  05/20/2015  CLINICAL DATA:  Acute onset of left-sided chest pain and shortness of breath. Initial encounter. EXAM: CHEST  2 VIEW COMPARISON:  Chest radiograph performed 05/17/2015 FINDINGS: The lungs are well-aerated. Peribronchial thickening is noted. Increased interstitial markings may reflect mild interstitial edema or possibly pneumonia. No pleural effusion or pneumothorax is seen. The heart is normal in size; the mediastinal contour is within normal limits. No acute osseous abnormalities are seen. IMPRESSION: Peribronchial thickening noted. Increased interstitial markings may reflect mild interstitial edema or possibly pneumonia. Electronically Signed   By: Roanna Raider M.D.   On: 05/20/2015 05:01   Dg Chest 2 View  05/17/2015  CLINICAL DATA:  Acute onset of cough and congestion. Generalized chest pain and shortness of breath. Initial encounter. EXAM: CHEST  2 VIEW COMPARISON:  Chest radiograph performed 10/29/2014 FINDINGS: The lungs are well-aerated. Peribronchial thickening is noted. Mild bibasilar atelectasis is seen, and chronically increased interstitial markings are noted. There is no evidence of pleural effusion or pneumothorax. The heart is normal in size; the mediastinal contour is within normal limits. No acute osseous abnormalities are seen. IMPRESSION: Peribronchial thickening noted. Mild bibasilar atelectasis seen. Increased interstitial markings noted. Electronically Signed   By: Roanna Raider M.D.   On: 05/17/2015 06:22   TELE: SR, frequent PVCs    ASSESSMENT AND PLAN  Principal Problem:  New onset atrial fibrillation/RVR Active Problems:  COPD exacerbation (HCC)  Diabetes mellitus type II, uncontrolled (HCC)  Hypothyroidism  HTN (hypertension)  Acute on chronic respiratory failure with hypoxia (HCC)  Chest pain  syndrome  Acute bronchitis  AKI (acute kidney injury) (HCC)  RBBB  Prolonged Q-T interval on ECG  CAD (coronary artery disease)  History of CVA (cerebrovascular accident)  A very pleasant 82 year old male with prior medical history of CVA, COPD secondary diabetes, known nonobstructive coronary artery disease per cardiac catheterization in January 2015 with maximal 30% stenosis in LAD. The patient presented to the ER on February 20 with acute on chronic COPD exacerbation and was started on steroids and doxycycline. This morning he developed chest pain and called EMS and was found to be in A. fib with RVR that is now rate controlled after 1 dose of IV Cardizem 10 mg. He was hypoxic with ambulation down to 82%. This is first episode of atrial fibrillation for this patient, and is most probably triggered by his upper respiratory infection, his brother who  lives with him and his household has acute flu.  There is a minimal troponin elevation of 0.06 --> that was believed to be a demand ischemia sec to A. fib with RVR, we were not planning on a cardiac cath since nonobstructive just 2 years ago. However he developed another episode of chest pain this am.  Since he is being treated for an acute URI I would suggest to perform a stress test tomorrow or later.   The patient is already fully anticoagulated, his CHADS-VASc score is 5, he will require anticoagulation, ideally NOAC if he doesn't have a valvular heart disease. We will check his echocardiogram and decide based on results. He is now is SR.   Lars Masson, MD  05/21/2015   Signed, Lars Masson MD, [facility] 05/21/2015

## 2015-05-21 NOTE — Care Management (Signed)
1605 05-21-15 Tomi Bamberger, RN,BSN [phone] CM did provide pt with 30 free card for Eliquis. CM will continue to monitor for disposition needs.

## 2015-05-21 NOTE — Progress Notes (Signed)
Patient Demographics:    [name], is a 82 y.o. male, DOB - 1927/12/21, UJW:[id number]  Admit date - 05/20/2015   Admitting Physician Hillary Bow, DO  Outpatient Primary MD for the patient is Pcp Not In System  LOS - 1   Chief Complaint  Patient presents with  . Chest Pain        Subjective:    [name] today has, No headache, intermittent left-sided chest pain, No abdominal pain - No Nausea, No new weakness tingling or numbness, No Cough - SOB.     Assessment  & Plan :    Principal Problem:   New onset atrial fibrillation/RVR Active Problems:   COPD exacerbation (HCC)   Diabetes mellitus type II, uncontrolled (HCC)   Hypothyroidism   HTN (hypertension)   Acute on chronic respiratory failure with hypoxia (HCC)   Chest pain syndrome   Acute bronchitis   AKI (acute kidney injury) (HCC)   RBBB   Prolonged Q-T interval on ECG   CAD (coronary artery disease)   History of CVA (cerebrovascular accident)   1. Acute on chronic hypoxic respiratory failure due to COPD exacerbation. Much improved this morning, minimal wheezing, taper down IV steroids, continue doxycycline, continue nebulizer treatments. Has ruled out for influenza. Tamiflu. Already on home oxygen continue.  2. Chest pain with history of nonobstructive CAD. Had unremarkable left heart cath in 2015. Mild troponin rise in non-ACS pattern due to #1 above, intermittent chest pain, cardiology following, currently on full dose Lovenox, and tinea aspirin, beta blocker and statin for secondary prevention. Likely stress test tomorrow per cardiology. Echo pending as well.  3. Essential hypertension. Stable on beta blocker.  4. Mild history of present illness. Resolved. Holding ACE inhibitor and home dose diuretic.  5.  Hypothyroidism. Continue home dose Synthroid.  6. History of CVA. Continue aspirin and statin for second or prevention.  7. DM type II. Currently on sliding scale insulin, monitor with tapering steroid dose.  Lab Results  Component Value Date   HGBA1C 7.5* 05/20/2015    CBG (last 3)   Recent Labs  05/20/15 2307 05/21/15 0418 05/21/15 0808  GLUCAP 276* 217* 183*      Code Status : Full  Family Communication  : None  Disposition Plan  : Home in 1-2 days  Consults  :  Cardiology  Procedures  :   Myoview per cardiology  DVT Prophylaxis  :  Lovenox    Lab Results  Component Value Date   PLT 200 05/21/2015    Inpatient Medications  Scheduled Meds: . amLODipine  2.5 mg Oral Daily  . aspirin  324 mg Oral Once  . aspirin EC  81 mg Oral Daily  . budesonide-formoterol  2 puff Inhalation BID  . doxycycline  100 mg Oral Q12H  . enoxaparin (LOVENOX) injection  70 mg Subcutaneous Q12H  . finasteride  5 mg Oral Daily  . insulin aspart  0-5 Units Subcutaneous QHS  . insulin aspart  0-9 Units Subcutaneous TID WC  . ipratropium-albuterol  3 mL Nebulization TID  . ketotifen  2 drop Both Eyes BID  . latanoprost  1 drop Both Eyes QHS  . levothyroxine  25 mcg Oral QAC breakfast  . methylPREDNISolone (SOLU-MEDROL)  injection  60 mg Intravenous Q12H  . metoprolol  25 mg Oral BID  . multivitamin with minerals  1 tablet Oral Daily  . pantoprazole  40 mg Oral Daily  . pravastatin  40 mg Oral Daily  . terazosin  10 mg Oral QHS   Continuous Infusions:  PRN Meds:.acetaminophen, albuterol, nitroGLYCERIN  Antibiotics  :     Anti-infectives    Start     Dose/Rate Route Frequency Ordered Stop   05/20/15 2200  oseltamivir (TAMIFLU) capsule 30 mg  Status:  Discontinued     30 mg Oral 2 times daily 05/20/15 1303 05/21/15 0905   05/20/15 1000  doxycycline (VIBRA-TABS) tablet 100 mg     100 mg Oral Every 12 hours 05/20/15 0713     05/20/15 1000  oseltamivir (TAMIFLU) capsule 75 mg   Status:  Discontinued     75 mg Oral 2 times daily 05/20/15 0748 05/20/15 1303   05/20/15 0615  levofloxacin (LEVAQUIN) IVPB 750 mg  Status:  Discontinued     750 mg 100 mL/hr over 90 Minutes Intravenous  Once 05/20/15 0604 05/20/15 0805        Objective:   Filed Vitals:   05/20/15 1500 05/20/15 1940 05/21/15 0420 05/21/15 0900  BP: 120/67 148/56 130/49   Pulse: 83 65 68   Temp:  98.6 F (37 C) 98.6 F (37 C)   TempSrc:  Oral Oral   Resp: 24 19 19    Height:  5\' 8"  (1.727 m)    Weight:  70.67 kg (155 lb 12.8 oz) 71.124 kg (156 lb 12.8 oz)   SpO2: 96% 90% 91% 97%    Wt Readings from Last 3 Encounters:  05/21/15 71.124 kg (156 lb 12.8 oz)  05/16/15 74.299 kg (163 lb 12.8 oz)  10/28/14 74.571 kg (164 lb 6.4 oz)     Intake/Output Summary (Last 24 hours) at 05/21/15 1111 Last data filed at 05/20/15 2005  Gross per 24 hour  Intake    390 ml  Output    750 ml  Net   -360 ml     Physical Exam  Awake Alert, Oriented X 3, No new F.N deficits, Normal affect [place].AT,PERRAL Supple Neck,No JVD, No cervical lymphadenopathy appriciated.  Symmetrical Chest wall movement, Mod air movement bilaterally, minimal to no wheezing RRR,No Gallops,Rubs or new Murmurs, No Parasternal Heave +ve B.Sounds, Abd Soft, No tenderness, No organomegaly appriciated, No rebound - guarding or rigidity. No Cyanosis, Clubbing or edema, No new Rash or bruise        Data Review:   Micro Results No results found for this or any previous visit (from the past 240 hour(s)).  Radiology Reports Dg Chest 2 View  05/21/2015  CLINICAL DATA:  Acute on chronic respiratory failure EXAM: CHEST  2 VIEW COMPARISON:  05/20/2015 FINDINGS: Cardiomediastinal silhouette is stable. Persistent mild perihilar increased bronchial and interstitial markings without convincing pulmonary edema. Streaky bilateral basilar atelectasis or infiltrate left greater than right. Mild thoracic spine osteopenia again noted. IMPRESSION:  Persistent mild perihilar increased bronchial and interstitial markings without convincing pulmonary edema. Streaky bilateral basilar atelectasis or infiltrate left greater than right. Electronically Signed   By: Natasha Mead M.D.   On: 05/21/2015 08:11   Dg Chest 2 View  05/20/2015  CLINICAL DATA:  Acute onset of left-sided chest pain and shortness of breath. Initial encounter. EXAM: CHEST  2 VIEW COMPARISON:  Chest radiograph performed 05/17/2015 FINDINGS: The lungs are well-aerated. Peribronchial thickening is noted. Increased interstitial markings  may reflect mild interstitial edema or possibly pneumonia. No pleural effusion or pneumothorax is seen. The heart is normal in size; the mediastinal contour is within normal limits. No acute osseous abnormalities are seen. IMPRESSION: Peribronchial thickening noted. Increased interstitial markings may reflect mild interstitial edema or possibly pneumonia. Electronically Signed   By: Roanna Raider M.D.   On: 05/20/2015 05:01   Dg Chest 2 View  05/17/2015  CLINICAL DATA:  Acute onset of cough and congestion. Generalized chest pain and shortness of breath. Initial encounter. EXAM: CHEST  2 VIEW COMPARISON:  Chest radiograph performed 10/29/2014 FINDINGS: The lungs are well-aerated. Peribronchial thickening is noted. Mild bibasilar atelectasis is seen, and chronically increased interstitial markings are noted. There is no evidence of pleural effusion or pneumothorax. The heart is normal in size; the mediastinal contour is within normal limits. No acute osseous abnormalities are seen. IMPRESSION: Peribronchial thickening noted. Mild bibasilar atelectasis seen. Increased interstitial markings noted. Electronically Signed   By: Roanna Raider M.D.   On: 05/17/2015 06:22     CBC  Recent Labs Lab 05/17/15 0030 05/20/15 0339 05/21/15 0458  WBC 9.7 9.6 7.3  HGB 12.8* 13.1 12.8*  HCT 41.0 40.1 38.4*  PLT 166 179 200  MCV 89.1 89.3 88.3  MCH 27.8 29.2 29.4    MCHC 31.2 32.7 33.3  RDW 13.5 13.5 13.1  LYMPHSABS 0.8 1.3  --   MONOABS 0.7 1.0  --   EOSABS 0.2 0.1  --   BASOSABS 0.0 0.0  --     Chemistries   Recent Labs Lab 05/17/15 0030 05/20/15 0339 05/21/15 0458  NA 139 139 136  K 4.6 4.4 4.3  CL 103 101 104  CO2 GLUCOSE 148* 214* 251*  BUN 12 23* 27*  CREATININE 1.14 1.38* 1.19  CALCIUM 9.3 9.0 9.3  AST  --  15  --   ALT  --  12*  --   ALKPHOS  --  42  --   BILITOT  --  1.1  --    ------------------------------------------------------------------------------------------------------------------  Recent Labs  05/21/15 0458  CHOL 131  HDL 35*  LDLCALC 71  TRIG 130  CHOLHDL 3.7    Lab Results  Component Value Date   HGBA1C 7.5* 05/20/2015   ------------------------------------------------------------------------------------------------------------------  Recent Labs  05/20/15 0802  TSH 2.427   ------------------------------------------------------------------------------------------------------------------ No results for input(s): VITAMINB12, FOLATE, FERRITIN, TIBC, IRON, RETICCTPCT in the last 72 hours.  Coagulation profile No results for input(s): INR, PROTIME in the last 168 hours.  No results for input(s): DDIMER in the last 72 hours.  Cardiac Enzymes  Recent Labs Lab 05/20/15 0802 05/20/15 1700 05/20/15 2020  TROPONINI 0.06* 0.05* 0.05*   ------------------------------------------------------------------------------------------------------------------    Component Value Date/Time   BNP 124.7* 05/20/2015 0804    Time Spent in minutes   35   Teofil Maniaci K M.D on 05/21/2015 at 11:11 AM  Between 7am to 7pm - Pager - [phone]  After 7pm go to [email or web address] - password [facility]  [facility] -  Office  [phone]

## 2015-05-22 ENCOUNTER — Inpatient Hospital Stay (HOSPITAL_COMMUNITY): Payer: Medicare Other

## 2015-05-22 DIAGNOSIS — R079 Chest pain, unspecified: Secondary | ICD-10-CM

## 2015-05-22 DIAGNOSIS — I25118 Atherosclerotic heart disease of native coronary artery with other forms of angina pectoris: Secondary | ICD-10-CM

## 2015-05-22 DIAGNOSIS — Z5181 Encounter for therapeutic drug level monitoring: Secondary | ICD-10-CM

## 2015-05-22 DIAGNOSIS — J441 Chronic obstructive pulmonary disease with (acute) exacerbation: Secondary | ICD-10-CM

## 2015-05-22 DIAGNOSIS — J208 Acute bronchitis due to other specified organisms: Secondary | ICD-10-CM

## 2015-05-22 DIAGNOSIS — Z7901 Long term (current) use of anticoagulants: Secondary | ICD-10-CM

## 2015-05-22 LAB — GLUCOSE, CAPILLARY
GLUCOSE-CAPILLARY: 113 mg/dL — AB (ref 65–99)
GLUCOSE-CAPILLARY: 308 mg/dL — AB (ref 65–99)

## 2015-05-22 LAB — NM MYOCAR MULTI W/SPECT W/WALL MOTION / EF
Estimated workload: 1 METS
Exercise duration (min): 0 min
Exercise duration (sec): 0 s
MPHR: 135 {beats}/min
Peak HR: 79 {beats}/min
Percent HR: 58 %
Rest HR: 62 {beats}/min

## 2015-05-22 MED ORDER — TECHNETIUM TC 99M SESTAMIBI GENERIC - CARDIOLITE
30.0000 | Freq: Once | INTRAVENOUS | Status: AC | PRN
  Administered 2015-05-22: 30 via INTRAVENOUS

## 2015-05-22 MED ORDER — REGADENOSON 0.4 MG/5ML IV SOLN
0.4000 mg | Freq: Once | INTRAVENOUS | Status: AC
  Administered 2015-05-22: 0.4 mg via INTRAVENOUS
  Filled 2015-05-22: qty 5

## 2015-05-22 MED ORDER — PREDNISONE 5 MG PO TABS: ORAL_TABLET | ORAL | Status: DC

## 2015-05-22 MED ORDER — TECHNETIUM TC 99M SESTAMIBI GENERIC - CARDIOLITE
10.0000 | Freq: Once | INTRAVENOUS | Status: AC | PRN
  Administered 2015-05-22: 10 via INTRAVENOUS

## 2015-05-22 MED ORDER — APIXABAN 5 MG PO TABS: 5.0000 mg | ORAL_TABLET | Freq: Two times a day (BID) | ORAL | Status: DC

## 2015-05-22 MED ORDER — AEROCHAMBER Z-STAT PLUS/MEDIUM MISC
Status: AC
  Filled 2015-05-22: qty 1

## 2015-05-22 MED ORDER — DOXYCYCLINE HYCLATE 100 MG PO CAPS: 100.0000 mg | ORAL_CAPSULE | Freq: Two times a day (BID) | ORAL | Status: DC

## 2015-05-22 MED ORDER — REGADENOSON 0.4 MG/5ML IV SOLN
INTRAVENOUS | Status: AC
  Filled 2015-05-22: qty 5

## 2015-05-22 NOTE — Discharge Instructions (Signed)
Follow with Primary MD in 7 days   Get CBC, CMP, 2 view Chest X ray checked  by Primary MD next visit.    Activity: As tolerated with Full fall precautions use walker/cane & assistance as needed   Disposition Home     Diet:   Heart Healthy low Carb.  For Heart failure patients - Check your Weight same time everyday, if you gain over 2 pounds, or you develop in leg swelling, experience more shortness of breath or chest pain, call your Primary MD immediately. Follow Cardiac Low Salt Diet and 1.5 lit/day fluid restriction.   On your next visit with your primary care physician please Get Medicines reviewed and adjusted.   Please request your Prim.MD to go over all Hospital Tests and Procedure/Radiological results at the follow up, please get all Hospital records sent to your Prim MD by signing hospital release before you go home.   If you experience worsening of your admission symptoms, develop shortness of breath, life threatening emergency, suicidal or homicidal thoughts you must seek medical attention immediately by calling 911 or calling your MD immediately  if symptoms less severe.  You Must read complete instructions/literature along with all the possible adverse reactions/side effects for all the Medicines you take and that have been prescribed to you. Take any new Medicines after you have completely understood and accpet all the possible adverse reactions/side effects.   Do not drive, operating heavy machinery, perform activities at heights, swimming or participation in water activities or provide baby sitting services if your were admitted for syncope or siezures until you have seen by Primary MD or a Neurologist and advised to do so again.  Do not drive when taking Pain medications.    Do not take more than prescribed Pain, Sleep and Anxiety Medications  Special Instructions: If you have smoked or chewed Tobacco  in the last 2 yrs please stop smoking, stop any regular Alcohol   and or any Recreational drug use.  Wear Seat belts while driving.   Please note  You were cared for by a hospitalist during your hospital stay. If you have any questions about your discharge medications or the care you received while you were in the hospital after you are discharged, you can call the unit and asked to speak with the hospitalist on call if the hospitalist that took care of you is not available. Once you are discharged, your primary care physician will handle any further medical issues. Please note that NO REFILLS for any discharge medications will be authorized once you are discharged, as it is imperative that you return to your primary care physician (or establish a relationship with a primary care physician if you do not have one) for your aftercare needs so that they can reassess your need for medications and monitor your lab values.

## 2015-05-22 NOTE — Discharge Summary (Signed)
[name], is a 82 y.o. male  DOB 04-23-1927  MRN [id number].  Admission date:  05/20/2015  Admitting Physician  Hillary Bow, DO  Discharge Date:  05/22/2015   Primary MD  Pcp Not In System  Recommendations for primary care physician for things to follow:   Monitor secondary risk factors for CAD, check 2 veww CXR, CBC and BMP in 1 week   Admission Diagnosis  CAP (community acquired pneumonia) [J18.9] Atrial fibrillation with RVR (HCC) [I48.91] Chest pain, unspecified chest pain type [R07.9]   Discharge Diagnosis  CAP (community acquired pneumonia) [J18.9] Atrial fibrillation with RVR (HCC) [I48.91] Chest pain, unspecified chest pain type [R07.9]   Principal Problem:   New onset atrial fibrillation/RVR Active Problems:   COPD exacerbation (HCC)   Diabetes mellitus type II, uncontrolled (HCC)   Hypothyroidism   HTN (hypertension)   Acute on chronic respiratory failure with hypoxia (HCC)   Chest pain syndrome   Acute bronchitis   AKI (acute kidney injury) (HCC)   RBBB   Prolonged Q-T interval on ECG   CAD (coronary artery disease)   History of CVA (cerebrovascular accident)      Past Medical History  Diagnosis Date  . CVA (cerebral infarction)   . COPD (chronic obstructive pulmonary disease) (HCC)   . Essential hypertension, benign   . Diabetes mellitus without complication (HCC)   . Atrial fibrillation (HCC)   . Hard of hearing     Past Surgical History  Procedure Laterality Date  . Hernia repair    . Left heart catheterization with coronary angiogram N/A 03/28/2013    Procedure: LEFT HEART CATHETERIZATION WITH CORONARY ANGIOGRAM;  Surgeon: Peter M [place], MD;  Location: [facility] CATH LAB;  Service: Cardiovascular;  Laterality: N/A;       HPI  from the history and physical done on the day  of admission:    82 year old male patient with past medical history of stroke with residual chronic expressive aphasia, diabetes on metformin, COPD on chronic oxygen but noncompliant with oxygen, history of recurrent chest pain with nonobstructive cardiac catheterization in January 2015, hypertension, dyslipidemia, hypothyroidism who presented to the hospital after awakening with left anterior chest pain. He described this pain as being a level 8/10 This was associated with diaphoresis and shortness of breath. EMS was called to the home and the patient was given 3 nitroglycerin sublingual with improvement in pain to 2/10 and resolution of shortness of breath. Patient was found to be hypoxemic (he was not wearing his oxygen) and 3 L oxygen was applied. He was found to be in atrial fibrillation with ventricular rate 150 bpm so was given 10 mg of Cardizem. In addition he was given aspirin. He had been evaluated in our ER on 2/20 complaints of generalized weakness and dysuria for 2-3 days with productive cough. He that time he denied fevers or body aches nausea vomiting or diarrhea. At that time it was noted that when he ambulated on room air his pulse oximetry dropped to 82%. It  was suspected at that time patient's symptoms were coming from hypoxemia. He was also wheezing on exam. It was suspected he had acute COPD exacerbation with possible underlying bronchitis so he was discharged with prescriptions for prednisone, and inhaler and doxycycline. He was encouraged by the EDP to use his home oxygen.  Upon further questioning today patient's son revealed that patient did not fill any of those prescriptions. In further discussion with the patient, although he does have oxygen available at home he typically does not use this regularly. He reports a chronic history of exertional chest pain while not wearing oxygen. Patient frequently takes sublingual nitroglycerin for the chest pain. He does not have any awareness of  palpitations. He does report that over the past 7 days he has had productive cough (worse than baseline) with nocturnal subjective fevers and chills. His son stated he witnessed patient having rigors. There is a family member that has recently been diagnosed with the flu that the patient has had close contact with. Son is concerned that patient needs assistance with medication administration despite having another son and daughter-in-law as well as wife living in the home.     Hospital Course:     1. Acute on chronic hypoxic respiratory failure due to COPD exacerbation. Much improved this morning, minimal wheezing, taper down IV steroids to PO, continue doxycycline, continue nebulizer treatments. Has ruled out for influenza. Tamiflu stopped. Already on home oxygen continue.  2. Chest pain with history of nonobstructive CAD. Had unremarkable left heart cath in 2015. Mild troponin rise in non-ACS pattern due to #1 above, intermittent chest pain, cardiology following, continue aspirin, beta blocker and statin for secondary prevention. Stable TTE and Stress test, cleared by cards for DC. Pain likely due to COPD exacerbation.  3. Essential hypertension. Stable on beta blocker.  4. Mild history of present illness. Resolved. Holding ACE inhibitor and home dose diuretic.  5. Hypothyroidism. Continue home dose Synthroid.  6. History of CVA. Continue aspirin and statin for second or prevention.  7. DM type II. Continue home Rx.       Discharge Condition: Stable  Follow UP  Follow-up Information    Follow up with PCP. Schedule an appointment as soon as possible for a visit in 1 week.   Why:  2 view CXR, CBC, BMP in 2 week       Consults obtained - Cards  Diet and Activity recommendation: See Discharge Instructions below  Discharge Instructions       Discharge Instructions    Discharge instructions    Complete by:  As directed   Follow with Primary MD in 7 days   Get CBC, CMP, 2  view Chest X ray checked  by Primary MD next visit.    Activity: As tolerated with Full fall precautions use walker/cane & assistance as needed   Disposition Home     Diet:   Heart Healthy low Carb.  For Heart failure patients - Check your Weight same time everyday, if you gain over 2 pounds, or you develop in leg swelling, experience more shortness of breath or chest pain, call your Primary MD immediately. Follow Cardiac Low Salt Diet and 1.5 lit/day fluid restriction.   On your next visit with your primary care physician please Get Medicines reviewed and adjusted.   Please request your Prim.MD to go over all Hospital Tests and Procedure/Radiological results at the follow up, please get all Hospital records sent to your Prim MD by signing hospital release before  you go home.   If you experience worsening of your admission symptoms, develop shortness of breath, life threatening emergency, suicidal or homicidal thoughts you must seek medical attention immediately by calling 911 or calling your MD immediately  if symptoms less severe.  You Must read complete instructions/literature along with all the possible adverse reactions/side effects for all the Medicines you take and that have been prescribed to you. Take any new Medicines after you have completely understood and accpet all the possible adverse reactions/side effects.   Do not drive, operating heavy machinery, perform activities at heights, swimming or participation in water activities or provide baby sitting services if your were admitted for syncope or siezures until you have seen by Primary MD or a Neurologist and advised to do so again.  Do not drive when taking Pain medications.    Do not take more than prescribed Pain, Sleep and Anxiety Medications  Special Instructions: If you have smoked or chewed Tobacco  in the last 2 yrs please stop smoking, stop any regular Alcohol  and or any Recreational drug use.  Wear Seat belts  while driving.   Please note  You were cared for by a hospitalist during your hospital stay. If you have any questions about your discharge medications or the care you received while you were in the hospital after you are discharged, you can call the unit and asked to speak with the hospitalist on call if the hospitalist that took care of you is not available. Once you are discharged, your primary care physician will handle any further medical issues. Please note that NO REFILLS for any discharge medications will be authorized once you are discharged, as it is imperative that you return to your primary care physician (or establish a relationship with a primary care physician if you do not have one) for your aftercare needs so that they can reassess your need for medications and monitor your lab values.     Increase activity slowly    Complete by:  As directed              Discharge Medications       Medication List    STOP taking these medications        predniSONE 20 MG tablet  Commonly known as:  DELTASONE  Replaced by:  predniSONE 5 MG tablet      TAKE these medications        albuterol 108 (90 Base) MCG/ACT inhaler  Commonly known as:  PROVENTIL HFA;VENTOLIN HFA  Inhale 2 puffs into the lungs every 4 (four) hours as needed for wheezing or shortness of breath.     amLODipine 5 MG tablet  Commonly known as:  NORVASC  Take 2.5 mg by mouth daily.     aspirin 81 MG EC tablet  Take 1 tablet (81 mg total) by mouth daily.     budesonide-formoterol 80-4.5 MCG/ACT inhaler  Commonly known as:  SYMBICORT  Inhale 2 puffs into the lungs 2 (two) times daily.     COMBIVENT RESPIMAT 20-100 MCG/ACT Aers respimat  Generic drug:  Ipratropium-Albuterol  Inhale 1 puff into the lungs every 6 (six) hours as needed for wheezing.     doxycycline 100 MG capsule  Commonly known as:  VIBRAMYCIN  Take 1 capsule (100 mg total) by mouth 2 (two) times daily.     finasteride 5 MG tablet    Commonly known as:  PROSCAR  Take 5 mg by mouth daily.  hydrochlorothiazide 25 MG tablet  Commonly known as:  HYDRODIURIL  Take 12.5 mg by mouth daily.     ketotifen 0.025 % ophthalmic solution  Commonly known as:  ZADITOR  Place 2 drops into both eyes 2 (two) times daily.     latanoprost 0.005 % ophthalmic solution  Commonly known as:  XALATAN  Place 1 drop into both eyes at bedtime.     levothyroxine 25 MCG tablet  Commonly known as:  SYNTHROID, LEVOTHROID  Take 25 mcg by mouth daily before breakfast.     lisinopril 40 MG tablet  Commonly known as:  PRINIVIL,ZESTRIL  Take 40 mg by mouth daily.     metFORMIN 500 MG tablet  Commonly known as:  GLUCOPHAGE  Take 500 mg by mouth 2 (two) times daily with a meal.     metoprolol 50 MG tablet  Commonly known as:  LOPRESSOR  Take 25 mg by mouth 2 (two) times daily.     multivitamin with minerals tablet  Take 1 tablet by mouth daily.     omeprazole 20 MG capsule  Commonly known as:  PRILOSEC  Take 20 mg by mouth 2 (two) times daily before a meal.     pravastatin 40 MG tablet  Commonly known as:  PRAVACHOL  Take 40 mg by mouth daily.     predniSONE 5 MG tablet  Commonly known as:  DELTASONE  Label  & dispense according to the schedule below. 10 Pills PO for 3 days then, 8 Pills PO for 3 days, 6 Pills PO for 3 days, 4 Pills PO for 3 days, 2 Pills PO for 3 days, 1 Pills PO for 3 days, 1/2 Pill  PO for 3 days then STOP. Total 95 pills.     terazosin 10 MG capsule  Commonly known as:  HYTRIN  Take 10 mg by mouth at bedtime.        Major procedures and Radiology Reports - PLEASE review detailed and final reports for all details, in brief -   TTE  Left ventricle: The cavity size was normal. There was mild focal basal hypertrophy of the septum. Systolic function was vigorous. The estimated ejection fraction was in the range of 65% to 70%. Wall motion was normal; there were no regional wall motion abnormalities. There was  an increased relative contribution of atrial contraction to ventricular filling. Doppler parameters are consistent with abnormal left ventricular relaxation (grade 1 diastolic dysfunction). Doppler parameters are consistent with high ventricular filling pressure. - Aortic valve: Trileaflet; mildly thickened, mildly calcified leaflets. - Mitral valve: Calcified annulus. Mild diffuse calcification of the anterior leaflet. - Left atrium: The atrium was mildly dilated. - Pulmonic valve: There was trivial regurgitation.   Dg Chest 2 View  05/21/2015  CLINICAL DATA:  Acute on chronic respiratory failure EXAM: CHEST  2 VIEW COMPARISON:  05/20/2015 FINDINGS: Cardiomediastinal silhouette is stable. Persistent mild perihilar increased bronchial and interstitial markings without convincing pulmonary edema. Streaky bilateral basilar atelectasis or infiltrate left greater than right. Mild thoracic spine osteopenia again noted. IMPRESSION: Persistent mild perihilar increased bronchial and interstitial markings without convincing pulmonary edema. Streaky bilateral basilar atelectasis or infiltrate left greater than right. Electronically Signed   By: Natasha Mead M.D.   On: 05/21/2015 08:11   Dg Chest 2 View  05/20/2015  CLINICAL DATA:  Acute onset of left-sided chest pain and shortness of breath. Initial encounter. EXAM: CHEST  2 VIEW COMPARISON:  Chest radiograph performed 05/17/2015 FINDINGS: The lungs are well-aerated. Peribronchial  thickening is noted. Increased interstitial markings may reflect mild interstitial edema or possibly pneumonia. No pleural effusion or pneumothorax is seen. The heart is normal in size; the mediastinal contour is within normal limits. No acute osseous abnormalities are seen. IMPRESSION: Peribronchial thickening noted. Increased interstitial markings may reflect mild interstitial edema or possibly pneumonia. Electronically Signed   By: Roanna Raider M.D.   On: 05/20/2015 05:01   Dg  Chest 2 View  05/17/2015  CLINICAL DATA:  Acute onset of cough and congestion. Generalized chest pain and shortness of breath. Initial encounter. EXAM: CHEST  2 VIEW COMPARISON:  Chest radiograph performed 10/29/2014 FINDINGS: The lungs are well-aerated. Peribronchial thickening is noted. Mild bibasilar atelectasis is seen, and chronically increased interstitial markings are noted. There is no evidence of pleural effusion or pneumothorax. The heart is normal in size; the mediastinal contour is within normal limits. No acute osseous abnormalities are seen. IMPRESSION: Peribronchial thickening noted. Mild bibasilar atelectasis seen. Increased interstitial markings noted. Electronically Signed   By: Roanna Raider M.D.   On: 05/17/2015 06:22   Nm Myocar Multi W/spect W/wall Motion / Ef  05/22/2015  CLINICAL DATA:  Chest pain, atrial fibrillation, diabetes, hypertension EXAM: MYOCARDIAL IMAGING WITH SPECT (REST AND PHARMACOLOGIC-STRESS) GATED LEFT VENTRICULAR WALL MOTION STUDY LEFT VENTRICULAR EJECTION FRACTION TECHNIQUE: Standard myocardial SPECT imaging was performed after resting intravenous injection of 10 mCi Tc-82m sestamibi. Subsequently, intravenous infusion of Lexiscan was performed under the supervision of the Cardiology staff. At peak effect of the drug, 30 mCi Tc-53m sestamibi was injected intravenously and standard myocardial SPECT imaging was performed. Quantitative gated imaging was also performed to evaluate left ventricular wall motion, and estimate left ventricular ejection fraction. COMPARISON:  None. FINDINGS: Perfusion: There is decreased activity inferiorly on both rest and stress images. This area moves and thickens normally suggesting diaphragmatic attenuation. No reversible defects to suggest ischemia. Wall Motion: Normal left ventricular wall motion. No left ventricular dilation. Left Ventricular Ejection Fraction: 61 % End diastolic volume 106 ml End systolic volume 42 ml IMPRESSION: 1. No  reversible ischemia or infarction. Diaphragm attic attenuation inferiorly. 2. Normal left ventricular wall motion. 3. Left ventricular ejection fraction 61% 4. Low-risk stress test findings*. *2012 Appropriate Use Criteria for Coronary Revascularization Focused Update: J Am Coll Cardiol. 2012;59(9):857-881. http://content.dementiazones.com.aspx?articleid=[email or web address] Electronically Signed   By: Charlett Nose M.D.   On: 05/22/2015 11:25    Micro Results      Recent Results (from the past 240 hour(s))  Culture, blood (routine x 2) Call MD if unable to obtain prior to antibiotics being given     Status: None (Preliminary result)   Collection Time: 05/20/15  7:57 PM  Result Value Ref Range Status   Specimen Description BLOOD RIGHT ANTECUBITAL  Final   Special Requests BOTTLES DRAWN AEROBIC AND ANAEROBIC 5CC   Final   Culture NO GROWTH 2 DAYS  Final   Report Status PENDING  Incomplete  Culture, blood (routine x 2) Call MD if unable to obtain prior to antibiotics being given     Status: None (Preliminary result)   Collection Time: 05/20/15  8:02 PM  Result Value Ref Range Status   Specimen Description BLOOD LEFT ANTECUBITAL  Final   Special Requests BOTTLES DRAWN AEROBIC AND ANAEROBIC 5CC   Final   Culture NO GROWTH 2 DAYS  Final   Report Status PENDING  Incomplete       Today   Subjective    [name] today has no headache,no chest abdominal pain,no  new weakness tingling or numbness, feels much better wants to go home today.    Objective   Blood pressure 155/64, pulse 77, temperature 97.8 F (36.6 C), temperature source Oral, resp. rate 16, height 5\' 8"  (1.727 m), weight 71.351 kg (157 lb 4.8 oz), SpO2 96 %.   Intake/Output Summary (Last 24 hours) at 05/22/15 1329 Last data filed at 05/22/15 1100  Gross per 24 hour  Intake    240 ml  Output    250 ml  Net    -10 ml    Exam Awake Alert, Oriented x 3, No new F.N deficits, Normal affect [place].AT,PERRAL Supple Neck,No JVD,  No cervical lymphadenopathy appriciated.  Symmetrical Chest wall movement, Mod air movement bilaterally, No wheezing RRR,No Gallops,Rubs or new Murmurs, No Parasternal Heave +ve B.Sounds, Abd Soft, Non tender, No organomegaly appriciated, No rebound -guarding or rigidity. No Cyanosis, Clubbing or edema, No new Rash or bruise   Data Review   CBC w Diff: Lab Results  Component Value Date   WBC 7.3 05/21/2015   HGB 12.8* 05/21/2015   HCT 38.4* 05/21/2015   PLT 200 05/21/2015   LYMPHOPCT 14 05/20/2015   MONOPCT 10 05/20/2015   EOSPCT 1 05/20/2015   BASOPCT 0 05/20/2015    CMP: Lab Results  Component Value Date   NA 136 05/21/2015   K 4.3 05/21/2015   CL 104 05/21/2015   CO2 24 05/21/2015   BUN 27* 05/21/2015   CREATININE 1.19 05/21/2015   PROT 6.2* 05/20/2015   ALBUMIN 3.3* 05/20/2015   BILITOT 1.1 05/20/2015   ALKPHOS 42 05/20/2015   AST 15 05/20/2015   ALT 12* 05/20/2015  .   Total Time in preparing paper work, data evaluation and todays exam - 35 minutes  Leroy Sea M.D on 05/22/2015 at 1:29 PM  [facility]   Office  [phone]

## 2015-05-22 NOTE — Progress Notes (Signed)
Discharge teaching and instructions reviewed. No further questions. VSS. Prescriptions, discharge summary and belongings with pt. Pt discharging home via daughter and son.

## 2015-05-22 NOTE — Progress Notes (Signed)
Hospital Problem List     Principal Problem:   New onset atrial fibrillation/RVR Active Problems:   COPD exacerbation (HCC)   Diabetes mellitus type II, uncontrolled (HCC)   Hypothyroidism   HTN (hypertension)   Acute on chronic respiratory failure with hypoxia (HCC)   Chest pain syndrome   Acute bronchitis   AKI (acute kidney injury) (HCC)   RBBB   Prolonged Q-T interval on ECG   CAD (coronary artery disease)   History of CVA (cerebrovascular accident)    Patient Profile:   Primary Cardiologist: Dr. Diona Browner   82 yo male w/ PMH of CVA (approx. 10 years ago with minimal expressive aphasia residual), COPD, HTN, non insulin dependent DM and nonobstructive CAD by cath in 2015 admitted on 05/20/2015 for acute respiratory failure and chest pain.   Subjective   Denies any chest pain overnight or this AM (There is a note from 0200 saying he had chest pain which resolved without intervention). Reports his breathing is at baseline. Seen in Nuclear Medicine for 1-day NST.  Inpatient Medications    . amLODipine  2.5 mg Oral Daily  . aspirin  324 mg Oral Once  . aspirin EC  81 mg Oral Daily  . budesonide-formoterol  2 puff Inhalation BID  . doxycycline  100 mg Oral Q12H  . enoxaparin (LOVENOX) injection  70 mg Subcutaneous Q12H  . finasteride  5 mg Oral Daily  . insulin aspart  0-5 Units Subcutaneous QHS  . insulin aspart  0-9 Units Subcutaneous TID WC  . ipratropium-albuterol  3 mL Nebulization TID  . ketotifen  2 drop Both Eyes BID  . latanoprost  1 drop Both Eyes QHS  . levothyroxine  25 mcg Oral QAC breakfast  . methylPREDNISolone (SOLU-MEDROL) injection  60 mg Intravenous Q24H  . metoprolol  25 mg Oral BID  . multivitamin with minerals  1 tablet Oral Daily  . pantoprazole  40 mg Oral Daily  . pravastatin  40 mg Oral Daily  . regadenoson      . terazosin  10 mg Oral QHS    Vital Signs    Filed Vitals:   05/22/15 0856 05/22/15 0906 05/22/15 0908 05/22/15 0910  BP:  159/75 180/76 154/61 155/64  Pulse: 60 76 77 76  Temp:      TempSrc:      Resp:      Height:      Weight:      SpO2:        Intake/Output Summary (Last 24 hours) at 05/22/15 0914 Last data filed at 05/22/15 0730  Gross per 24 hour  Intake    240 ml  Output    250 ml  Net    -10 ml   Filed Weights   05/20/15 1940 05/21/15 0420 05/22/15 0448  Weight: 155 lb 12.8 oz (70.67 kg) 156 lb 12.8 oz (71.124 kg) 157 lb 4.8 oz (71.351 kg)    Physical Exam    General: Well developed, well nourished, male in no acute distress. Head: Normocephalic, atraumatic.  Neck: Supple without bruits, JVD not elevated. Lungs:  Resp regular and unlabored, CTA without wheezing or rales. Heart: RRR, S1, S2, no S3, S4, 2/6 SEM at RUSB; no rub. Abdomen: Soft, non-tender, non-distended with normoactive bowel sounds. No hepatomegaly. No rebound/guarding. No obvious abdominal masses. Extremities: No clubbing, cyanosis, or edema. Distal pedal pulses are 2+ bilaterally. Neuro: Alert and oriented X 3. Moves all extremities spontaneously. Psych: Normal affect.  Labs  CBC  Recent Labs  05/20/15 0339 05/21/15 0458  WBC 9.6 7.3  NEUTROABS 7.1  --   HGB 13.1 12.8*  HCT 40.1 38.4*  MCV 89.3 88.3  PLT 179 200   Basic Metabolic Panel  Recent Labs  05/20/15 0339 05/21/15 0458  NA 139 136  K 4.4 4.3  CL 101 104  CO2 25 24  GLUCOSE 214* 251*  BUN 23* 27*  CREATININE 1.38* 1.19  CALCIUM 9.0 9.3   Liver Function Tests  Recent Labs  05/20/15 0339  AST 15  ALT 12*  ALKPHOS 42  BILITOT 1.1  PROT 6.2*  ALBUMIN 3.3*   No results for input(s): LIPASE, AMYLASE in the last 72 hours. Cardiac Enzymes  Recent Labs  05/20/15 0802 05/20/15 1700 05/20/15 2020  TROPONINI 0.06* 0.05* 0.05*   BNP Invalid input(s): POCBNP D-Dimer No results for input(s): DDIMER in the last 72 hours. Hemoglobin A1C  Recent Labs  05/20/15 0804  HGBA1C 7.5*   Fasting Lipid Panel  Recent Labs   05/21/15 0458  CHOL 131  HDL 35*  LDLCALC 71  TRIG 161  CHOLHDL 3.7   Thyroid Function Tests  Recent Labs  05/21/15 0914  TSH 1.141    Telemetry    Not reviewed. Seen in Nuclear Medicine.   Cardiac Studies and Radiology    Dg Chest 2 View: 05/21/2015  CLINICAL DATA:  Acute on chronic respiratory failure EXAM: CHEST  2 VIEW COMPARISON:  05/20/2015 FINDINGS: Cardiomediastinal silhouette is stable. Persistent mild perihilar increased bronchial and interstitial markings without convincing pulmonary edema. Streaky bilateral basilar atelectasis or infiltrate left greater than right. Mild thoracic spine osteopenia again noted. IMPRESSION: Persistent mild perihilar increased bronchial and interstitial markings without convincing pulmonary edema. Streaky bilateral basilar atelectasis or infiltrate left greater than right. Electronically Signed   By: Natasha Mead M.D.   On: 05/21/2015 08:11   Dg Chest 2 View: 05/20/2015  CLINICAL DATA:  Acute onset of left-sided chest pain and shortness of breath. Initial encounter. EXAM: CHEST  2 VIEW COMPARISON:  Chest radiograph performed 05/17/2015 FINDINGS: The lungs are well-aerated. Peribronchial thickening is noted. Increased interstitial markings may reflect mild interstitial edema or possibly pneumonia. No pleural effusion or pneumothorax is seen. The heart is normal in size; the mediastinal contour is within normal limits. No acute osseous abnormalities are seen. IMPRESSION: Peribronchial thickening noted. Increased interstitial markings may reflect mild interstitial edema or possibly pneumonia. Electronically Signed   By: Roanna Raider M.D.   On: 05/20/2015 05:01   Dg Chest 2 View: 05/17/2015  CLINICAL DATA:  Acute onset of cough and congestion. Generalized chest pain and shortness of breath. Initial encounter. EXAM: CHEST  2 VIEW COMPARISON:  Chest radiograph performed 10/29/2014 FINDINGS: The lungs are well-aerated. Peribronchial thickening is noted. Mild  bibasilar atelectasis is seen, and chronically increased interstitial markings are noted. There is no evidence of pleural effusion or pneumothorax. The heart is normal in size; the mediastinal contour is within normal limits. No acute osseous abnormalities are seen. IMPRESSION: Peribronchial thickening noted. Mild bibasilar atelectasis seen. Increased interstitial markings noted. Electronically Signed   By: Roanna Raider M.D.   On: 05/17/2015 06:22    Echocardiogram: 05/21/2015 Study Conclusions - Left ventricle: The cavity size was normal. There was mild focal basal hypertrophy of the septum. Systolic function was vigorous. The estimated ejection fraction was in the range of 65% to 70%. Wall motion was normal; there were no regional wall motion abnormalities. There was an increased relative  contribution of atrial contraction to ventricular filling. Doppler parameters are consistent with abnormal left ventricular relaxation (grade 1 diastolic dysfunction). Doppler parameters are consistent with high ventricular filling pressure. - Aortic valve: Trileaflet; mildly thickened, mildly calcified leaflets. - Mitral valve: Calcified annulus. Mild diffuse calcification of the anterior leaflet. - Left atrium: The atrium was mildly dilated. - Pulmonic valve: There was trivial regurgitation.  Assessment & Plan    1. Chest Pain in the setting of Elevated troponin - cyclic troponin values have been 0.06, 0.05, and 0.05. - last cardiac catheterization in 2015 showed nonobstructive CAD. - denies any chest pain or anginal equivalents overnight or this morning. - Echo shows preserved EF of 65-70%. Seen in Nuclear Medicine today for 1-day NST. Official results pending by [facility].  2. New onset Afib - currently in NSR - continue Lopressor  BID for rate control. - This patients CHA2DS2-VASc Score = 5. Currently on full-dose Lovenox. Will need to switch to NOAC prior  to discharge.  3. Acute Hypoxic Respiratory Failure/ COPD exacerbation - Continue PTA medications.  - Flu negative - per admitting team  Signed, Ellsworth Lennox , PA-C 9:14 AM 05/22/2015 Pager: [phone]  The patient was seen and examined, and I agree with the assessment and plan as documented above, with modifications as noted below. No further chest pain or palps. Awaiting stress test results. Continue metoprolol. Currently on Lovenox. Will switch to Eliquis with pharmacy assistance.   Prentice Docker, MD, [facility]  05/22/2015 11:20 AM

## 2015-05-22 NOTE — Progress Notes (Signed)
ANTICOAGULATION CONSULT NOTE - Follow Up Consult  Pharmacy Consult for Lovenox > Apixaban Indication: atrial fibrillation  No Known Allergies  Patient Measurements: Height:  (172.7 cm) Weight: 157 lb 4.8 oz (71.351 kg) IBW/kg (Calculated) : 68.4  Vital Signs: Temp: 97.8 F (36.6 C) (02/25 0448) Temp Source: Oral (02/25 0448) BP: 155/64 mmHg (02/25 0910) Pulse Rate: 77 (02/25 0908)  Labs:  Recent Labs  05/20/15 0339 05/20/15 0802 05/20/15 1700 05/20/15 2020 05/21/15 0458  HGB 13.1  --   --   --  12.8*  HCT 40.1  --   --   --  38.4*  PLT 179  --   --   --  200  CREATININE 1.38*  --   --   --  1.19  TROPONINI  --  0.06* 0.05* 0.05*  --     Estimated Creatinine Clearance: 43.9 mL/min (by C-G formula based on Cr of 1.19).   Medications:  Scheduled:  . amLODipine  2.5 mg Oral Daily  . aspirin  324 mg Oral Once  . aspirin EC  81 mg Oral Daily  . budesonide-formoterol  2 puff Inhalation BID  . doxycycline  100 mg Oral Q12H  . enoxaparin (LOVENOX) injection  70 mg Subcutaneous Q12H  . finasteride  5 mg Oral Daily  . insulin aspart  0-5 Units Subcutaneous QHS  . insulin aspart  0-9 Units Subcutaneous TID WC  . ipratropium-albuterol  3 mL Nebulization TID  . ketotifen  2 drop Both Eyes BID  . latanoprost  1 drop Both Eyes QHS  . levothyroxine  25 mcg Oral QAC breakfast  . methylPREDNISolone (SOLU-MEDROL) injection  60 mg Intravenous Q24H  . metoprolol  25 mg Oral BID  . multivitamin with minerals  1 tablet Oral Daily  . pantoprazole  40 mg Oral Daily  . pravastatin  40 mg Oral Daily  . regadenoson      . terazosin  10 mg Oral QHS    Assessment: 82 yo M with new afib to transition from Lovenox to apixaban.  Last Lovenox dose was given this morning at 0747.  Ok to start apixaban this evening.  Pt meets criteria for  BID dosing.  Goal of Therapy:  Therapeutic Anticoagulation Monitor platelets by anticoagulation protocol: Yes   Plan:  D/C  Lovenox Apixaban  PO BID - first dose at 6pm. Apixaban education  [employer], Pharm.D., BCPS Clinical Pharmacist Pager [phone] 05/22/2015 11:34 AM

## 2015-05-22 NOTE — Progress Notes (Signed)
Patient with 5/10 chest pain around 10:00 pm. VS at the time HR 73 and BP 130/41. EKG performed and continue to show NSR with fasicular block, unchanged from prior. Pain slowly diminished without intervention. Instructed nursing to give metoprolol and monitor BP further before giving Terazosin.  At 2 AM: patient resting quietly without further chest pain. VS: 126/36.  Plan:  Stress test schedule for AM. Continue to monitor.

## 2015-05-24 LAB — LEGIONELLA ANTIGEN, URINE

## 2015-05-25 LAB — CULTURE, BLOOD (ROUTINE X 2)
CULTURE: NO GROWTH
Culture: NO GROWTH

## 2017-02-26 ENCOUNTER — Other Ambulatory Visit: Payer: Self-pay

## 2017-02-26 ENCOUNTER — Emergency Department (HOSPITAL_COMMUNITY): Payer: Medicare Other

## 2017-02-26 ENCOUNTER — Emergency Department (HOSPITAL_COMMUNITY)
Admission: EM | Admit: 2017-02-26 | Discharge: 2017-02-26 | Disposition: A | Discharge status: Home / Self Care | Payer: Medicare Other | Attending: Emergency Medicine | Admitting: Emergency Medicine

## 2017-02-26 DIAGNOSIS — Z79899 Other long term (current) drug therapy: Secondary | ICD-10-CM | POA: Insufficient documentation

## 2017-02-26 DIAGNOSIS — Z7984 Long term (current) use of oral hypoglycemic drugs: Secondary | ICD-10-CM | POA: Insufficient documentation

## 2017-02-26 DIAGNOSIS — Z8673 Personal history of transient ischemic attack (TIA), and cerebral infarction without residual deficits: Secondary | ICD-10-CM | POA: Diagnosis not present

## 2017-02-26 DIAGNOSIS — I2510 Atherosclerotic heart disease of native coronary artery without angina pectoris: Secondary | ICD-10-CM | POA: Diagnosis not present

## 2017-02-26 DIAGNOSIS — Z7982 Long term (current) use of aspirin: Secondary | ICD-10-CM | POA: Insufficient documentation

## 2017-02-26 DIAGNOSIS — Z87891 Personal history of nicotine dependence: Secondary | ICD-10-CM | POA: Diagnosis not present

## 2017-02-26 DIAGNOSIS — E039 Hypothyroidism, unspecified: Secondary | ICD-10-CM | POA: Insufficient documentation

## 2017-02-26 DIAGNOSIS — E119 Type 2 diabetes mellitus without complications: Secondary | ICD-10-CM | POA: Insufficient documentation

## 2017-02-26 DIAGNOSIS — J208 Acute bronchitis due to other specified organisms: Secondary | ICD-10-CM | POA: Insufficient documentation

## 2017-02-26 DIAGNOSIS — I10 Essential (primary) hypertension: Secondary | ICD-10-CM | POA: Diagnosis not present

## 2017-02-26 DIAGNOSIS — R05 Cough: Secondary | ICD-10-CM | POA: Diagnosis present

## 2017-02-26 DIAGNOSIS — J449 Chronic obstructive pulmonary disease, unspecified: Secondary | ICD-10-CM | POA: Diagnosis not present

## 2017-02-26 LAB — COMPREHENSIVE METABOLIC PANEL
ALK PHOS: 44 U/L (ref 38–126)
ALT: 16 U/L — AB (ref 17–63)
ANION GAP: 8 (ref 5–15)
AST: 21 U/L (ref 15–41)
Albumin: 3.8 g/dL (ref 3.5–5.0)
BILIRUBIN TOTAL: 0.7 mg/dL (ref 0.3–1.2)
BUN: 22 mg/dL — ABNORMAL HIGH (ref 6–20)
CALCIUM: 9 mg/dL (ref 8.9–10.3)
CO2: 25 mmol/L (ref 22–32)
CREATININE: 0.99 mg/dL (ref 0.61–1.24)
Chloride: 106 mmol/L (ref 101–111)
Glucose, Bld: 167 mg/dL — ABNORMAL HIGH (ref 65–99)
Potassium: 3.6 mmol/L (ref 3.5–5.1)
Sodium: 139 mmol/L (ref 135–145)
TOTAL PROTEIN: 6.6 g/dL (ref 6.5–8.1)

## 2017-02-26 LAB — INFLUENZA PANEL BY PCR (TYPE A & B)
INFLAPCR: NEGATIVE
Influenza B By PCR: NEGATIVE

## 2017-02-26 LAB — CBC WITH DIFFERENTIAL/PLATELET
Basophils Absolute: 0 10*3/uL (ref 0.0–0.1)
Basophils Relative: 0 %
Eosinophils Absolute: 0.2 10*3/uL (ref 0.0–0.7)
Eosinophils Relative: 2 %
HCT: 38.7 % — ABNORMAL LOW (ref 39.0–52.0)
Hemoglobin: 12.5 g/dL — ABNORMAL LOW (ref 13.0–17.0)
Lymphocytes Relative: 15 %
Lymphs Abs: 1.1 10*3/uL (ref 0.7–4.0)
MCH: 28.3 pg (ref 26.0–34.0)
MCHC: 32.3 g/dL (ref 30.0–36.0)
MCV: 87.6 fL (ref 78.0–100.0)
Monocytes Absolute: 0.8 10*3/uL (ref 0.1–1.0)
Monocytes Relative: 12 %
Neutro Abs: 5.1 10*3/uL (ref 1.7–7.7)
Neutrophils Relative %: 71 %
Platelets: 146 10*3/uL — ABNORMAL LOW (ref 150–400)
RBC: 4.42 MIL/uL (ref 4.22–5.81)
RDW: 13.7 % (ref 11.5–15.5)
WBC: 7.1 10*3/uL (ref 4.0–10.5)

## 2017-02-26 LAB — I-STAT CG4 LACTIC ACID, ED
Lactic Acid, Venous: 1.85 mmol/L (ref 0.5–1.9)
Lactic Acid, Venous: 1.1 mmol/L (ref 0.5–1.9)

## 2017-02-26 LAB — URINALYSIS, ROUTINE W REFLEX MICROSCOPIC
BACTERIA UA: NONE SEEN
Bilirubin Urine: NEGATIVE
Glucose, UA: 50 mg/dL — AB
Hgb urine dipstick: NEGATIVE
Ketones, ur: NEGATIVE mg/dL
Leukocytes, UA: NEGATIVE
Nitrite: NEGATIVE
PROTEIN: 100 mg/dL — AB
Specific Gravity, Urine: 1.021 (ref 1.005–1.030)
Squamous Epithelial / LPF: NONE SEEN
pH: 5 (ref 5.0–8.0)

## 2017-02-26 MED ORDER — DOXYCYCLINE HYCLATE 100 MG PO CAPS: 100.0000 mg | ORAL_CAPSULE | Freq: Two times a day (BID) | ORAL | 0 refills | Status: DC

## 2017-02-26 MED ORDER — IPRATROPIUM-ALBUTEROL 0.5-2.5 (3) MG/3ML IN SOLN
3.0000 mL | Freq: Once | RESPIRATORY_TRACT | Status: AC
  Administered 2017-02-26: 3 mL via RESPIRATORY_TRACT
  Filled 2017-02-26: qty 3

## 2017-02-26 MED ORDER — ALBUTEROL (5 MG/ML) CONTINUOUS INHALATION SOLN
5.0000 mg/h | INHALATION_SOLUTION | Freq: Once | RESPIRATORY_TRACT | Status: AC
  Administered 2017-02-26: 5 mg/h via RESPIRATORY_TRACT
  Filled 2017-02-26: qty 20

## 2017-02-26 MED ORDER — DOXYCYCLINE HYCLATE 100 MG PO TABS
100.0000 mg | ORAL_TABLET | Freq: Once | ORAL | Status: AC
  Administered 2017-02-26: 100 mg via ORAL
  Filled 2017-02-26: qty 1

## 2017-02-26 MED ORDER — SODIUM CHLORIDE 0.9 % IV BOLUS (SEPSIS)
1000.0000 mL | Freq: Once | INTRAVENOUS | Status: AC
  Administered 2017-02-26: 1000 mL via INTRAVENOUS

## 2017-02-26 MED ORDER — ACETAMINOPHEN 325 MG PO TABS
650.0000 mg | ORAL_TABLET | Freq: Once | ORAL | Status: DC
  Filled 2017-02-26: qty 2

## 2017-02-26 NOTE — ED Notes (Signed)
Patient still has not urinated. Patient states "I will try but I don't think I can pee".

## 2017-02-26 NOTE — ED Notes (Signed)
RN stated that she will draw labs at time of the start of the IV. Patient is aware a urine specimen is needed and has a urinal at bedside.

## 2017-02-26 NOTE — Discharge Instructions (Signed)
Your chest x-ray did not show any signs of pneumonia.  Blood work looks normal.  Continue your inhalers.  Please take Tylenol every 6 hours for fever.  Take doxycycline as prescribed until all gone.  Follow-up with family doctor in 2-3 days

## 2017-02-26 NOTE — ED Notes (Signed)
Patient walked with oxygen and stayed saturated between 95%-98%. Patient tolerated well-did not need assistance.

## 2017-02-26 NOTE — ED Notes (Signed)
Bed: WA22 Expected date:  Expected time:  Means of arrival:  Comments: EMS 

## 2017-02-26 NOTE — ED Provider Notes (Signed)
[facility]-EMERGENCY DEPT Provider Note   CSN: [id number] Arrival date & time: 02/26/17  1536     History   Chief Complaint Chief Complaint  Patient presents with  . Fever  . Cough    HPI [name] ElizabethWilburn L Grigg is a 83 y.o. male.  HPI [name] ElizabethWilburn L Asante is a 83 y.o. male with hx of afib, COPD, CVA, DM, HTN, presents to ED with complaint of cough. Pt states he has had increased cough over the last week. States cough is productive. States in the last few days has felt like he was running a fever. Has been using inhaler at home which has not helped. No other medications tried. Reports associated sore throat, congestion, watery eyes. Does not remember if got his flu shot today. Pt coming from home, lives with his wife. No other complaints.   Past Medical History:  Diagnosis Date  . Atrial fibrillation (HCC)   . COPD (chronic obstructive pulmonary disease) (HCC)   . CVA (cerebral infarction)   . Diabetes mellitus without complication (HCC)   . Essential hypertension, benign   . Hard of hearing     Patient Active Problem List   Diagnosis Date Noted  . New onset atrial fibrillation/RVR 05/20/2015  . Acute on chronic respiratory failure with hypoxia (HCC) 05/20/2015  . Chest pain syndrome 05/20/2015  . Acute bronchitis 05/20/2015  . AKI (acute kidney injury) (HCC) 05/20/2015  . RBBB 05/20/2015  . Prolonged Q-T interval on ECG 05/20/2015  . CAD (coronary artery disease) 05/20/2015  . History of CVA (cerebrovascular accident) 05/20/2015  . Atrial fibrillation with RVR (HCC)   . CAP (community acquired pneumonia)   . Elevated troponin   . Viral bronchitis 10/29/2014  . Viral gastroenteritis 10/29/2014  . Bronchiectasis with acute exacerbation (HCC)   . Fever 10/28/2014  . Unstable angina (HCC) 03/27/2013  . COPD exacerbation (HCC) 03/25/2013  . Diabetes mellitus type II, uncontrolled (HCC) 03/25/2013  . Hypothyroidism 03/25/2013  . BPH (benign prostatic  hyperplasia) 03/25/2013  . HTN (hypertension) 03/25/2013    Past Surgical History:  Procedure Laterality Date  . HERNIA REPAIR    . LEFT HEART CATHETERIZATION WITH CORONARY ANGIOGRAM N/A 03/28/2013   Procedure: LEFT HEART CATHETERIZATION WITH CORONARY ANGIOGRAM;  Surgeon: Peter M SwazilandJordan, MD;  [facility];  Service: Cardiovascular;  Laterality: N/A;       Home Medications    Prior to Admission medications   Medication Sig Start Date End Date Taking? Authorizing Provider  albuterol (PROVENTIL HFA;VENTOLIN HFA) 108 (90 Base) MCG/ACT inhaler Inhale 2 puffs into the lungs every 4 (four) hours as needed for wheezing or shortness of breath. 05/17/15  Yes Horton, Mayer Maskerourtney F, MD  amLODipine (NORVASC) 5 MG tablet Take 2.5 mg by mouth daily.   Yes [provider]  aspirin EC 81 MG EC tablet Take 1 tablet (81 mg total) by mouth daily. 03/29/13  Yes Mikhail, Nita SellsMaryann, DO  budesonide-formoterol (SYMBICORT) 80-4.5 MCG/ACT inhaler Inhale 2 puffs into the lungs 2 (two) times daily.    [provider]  doxycycline (VIBRAMYCIN) 100 MG capsule Take 1 capsule (100 mg total) by mouth 2 (two) times daily. Patient not taking: Reported on 02/26/2017 05/22/15   Leroy SeaSingh, Prashant K, MD  finasteride (PROSCAR) 5 MG tablet Take 5 mg by mouth daily.    [provider]  hydrochlorothiazide (HYDRODIURIL) 25 MG tablet Take 12.5 mg by mouth daily.    [provider]  Ipratropium-Albuterol (COMBIVENT RESPIMAT) 20-100 MCG/ACT AERS  respimat Inhale 1 puff into the lungs every 6 (six) hours as needed for wheezing.     [provider]  ketotifen (ZADITOR) 0.025 % ophthalmic solution Place 2 drops into both eyes 2 (two) times daily.    [provider]  latanoprost (XALATAN) 0.005 % ophthalmic solution Place 1 drop into both eyes at bedtime.    [provider]  levothyroxine (SYNTHROID, LEVOTHROID) 25 MCG tablet Take 25 mcg by mouth daily before breakfast.     [provider]  lisinopril (PRINIVIL,ZESTRIL) 40 MG tablet Take 40 mg by mouth daily.    [provider]  metFORMIN (GLUCOPHAGE) 500 MG tablet Take 500 mg by mouth 2 (two) times daily with a meal.     [provider]  metoprolol (LOPRESSOR) 50 MG tablet Take 25 mg by mouth 2 (two) times daily.    [provider]  Multiple Vitamins-Minerals (MULTIVITAMIN WITH MINERALS) tablet Take 1 tablet by mouth daily.    [provider]  omeprazole (PRILOSEC) 20 MG capsule Take 20 mg by mouth 2 (two) times daily before a meal.    [provider]  pravastatin (PRAVACHOL) 40 MG tablet Take 40 mg by mouth daily.    [provider]  predniSONE (DELTASONE) 5 MG tablet Label  & dispense according to the schedule below. 10 Pills PO for 3 days then, 8 Pills PO for 3 days, 6 Pills PO for 3 days, 4 Pills PO for 3 days, 2 Pills PO for 3 days, 1 Pills PO for 3 days, 1/2 Pill  PO for 3 days then STOP. Total 95 pills. Patient not taking: Reported on 02/26/2017 05/22/15   Leroy SeaSingh, Prashant K, MD  terazosin (HYTRIN) 10 MG capsule Take 10 mg by mouth at bedtime.    [provider]    Family History No family history on file.  Social History Social History   Tobacco Use  . Smoking status: Former Smoker    Types: Cigarettes  . Smokeless tobacco: Former Engineer, waterUser  Substance Use Topics  . Alcohol use: No  . Drug use: No     Allergies   Patient has no known allergies.   Review of Systems Review of Systems  Constitutional: Positive for chills and fever.  HENT: Positive for congestion and sore throat.   Eyes: Positive for redness.  Respiratory: Positive for cough and shortness of breath. Negative for chest tightness.   Cardiovascular: Negative for chest pain, palpitations and leg swelling.  Gastrointestinal: Negative for abdominal distention, abdominal pain, diarrhea, nausea and vomiting.  Genitourinary: Negative for dysuria, frequency, hematuria and  urgency.  Musculoskeletal: Negative for arthralgias, myalgias, neck pain and neck stiffness.  Skin: Negative for rash.  Allergic/Immunologic: Negative for immunocompromised state.  Neurological: Negative for dizziness, weakness, light-headedness, numbness and headaches.  All other systems reviewed and are negative.    Physical Exam Updated Vital Signs BP (!) 173/74 (BP Location: Right Arm)   Pulse 74   Temp (!) 100.4 F (38 C) (Oral)   Resp 16   Ht 5\' 8"  (1.727 m)   Wt 72.6 kg (160 lb)   SpO2 98% Comment: 2L  BMI 24.33 kg/m   Physical Exam  Constitutional: He is oriented to person, place, and time. He appears well-developed and well-nourished. No distress.  HENT:  Head: Normocephalic and atraumatic.  Mouth/Throat: Oropharynx is clear and moist.  Nasal congestion present  Eyes:  Conjunctiva injected bilaterally  Neck: Normal range of motion. Neck supple.  Cardiovascular: Normal rate, regular  rhythm and normal heart sounds.  Pulmonary/Chest: Effort normal. No respiratory distress. He has no rales.  Decreased air movement bilaterally  Abdominal: Soft. Bowel sounds are normal. He exhibits no distension. There is no tenderness. There is no rebound.  Musculoskeletal: He exhibits no edema.  Neurological: He is alert and oriented to person, place, and time.  Skin: Skin is warm and dry.  Nursing note and vitals reviewed.    ED Treatments / Results  Labs (all labs ordered are listed, but only abnormal results are displayed) Labs Reviewed  CBC WITH DIFFERENTIAL/PLATELET - Abnormal; Notable for the following components:      Result Value   Hemoglobin 12.5 (*)    HCT 38.7 (*)    Platelets 146 (*)    All other components within normal limits  COMPREHENSIVE METABOLIC PANEL - Abnormal; Notable for the following components:   Glucose, Bld 167 (*)    BUN 22 (*)    ALT 16 (*)    All other components within normal limits  URINALYSIS, ROUTINE W REFLEX MICROSCOPIC - Abnormal;  Notable for the following components:   Glucose, UA 50 (*)    Protein, ur 100 (*)    All other components within normal limits  CULTURE, BLOOD (ROUTINE X 2)  CULTURE, BLOOD (ROUTINE X 2)  INFLUENZA PANEL BY PCR (TYPE A & B)  I-STAT CG4 LACTIC ACID, ED  I-STAT CG4 LACTIC ACID, ED    EKG  EKG Interpretation  Date/Time:  Monday February 26 2017 15:57:56 EST Ventricular Rate:  72 PR Interval:    QRS Duration: 140 QT Interval:  416 QTC Calculation: 456 R Axis:   -86 Text Interpretation:  Sinus rhythm Atrial premature complexes RBBB and LAFB When copmapred to prior, no significant changes seen,  No STEMI Confirmed by Theda Belfast ([id number]) on 02/26/2017 6:39:19 PM       Radiology Dg Chest 2 View  Result Date: 02/26/2017 CLINICAL DATA:  Cough, fever for 3 days EXAM: CHEST  2 VIEW COMPARISON:  05/20/2015, 05/21/2015 FINDINGS: There is bilateral chronic interstitial thickening. There is no focal parenchymal opacity. There is no pleural effusion or pneumothorax. The heart and mediastinal contours are unremarkable. The osseous structures are unremarkable. IMPRESSION: No active cardiopulmonary disease. Electronically Signed   By: Elige Ko   On: 02/26/2017 17:52    Procedures Procedures (including critical care time)  Medications Ordered in ED Medications  sodium chloride 0.9 % bolus 1,000 mL (not administered)     Initial Impression / Assessment and Plan / ED Course  I have reviewed the triage vital signs and the nursing notes.  Pertinent labs & imaging results that were available during my care of the patient were reviewed by me and considered in my medical decision making (see chart for details).     Pt with fever, cough for a week. Temp 100.4 orally here. Will get labs, blood cultures, CXR. Will give fluids, duoneb.    8:45 PM Patient received an hour-long treatment and feels much better.  His influenza panel is negative.  Urinalysis unremarkable.  Lactic acid is normal.   White blood cell count is 7.1.  His chest x-ray is negative for pneumonia.  Patient feels better.  Patient ambulated, maintain oxygen saturation his regular oxygen.  He states he feels well to go home.  Will discharge home with doxycycline, continue breathing treatments.  Tylenol for fever.  Follow-up with family doctor in 2-3 days.  Return precautions discussed.  Vitals:   02/26/17 1550 02/26/17  1827 02/26/17 1851 02/26/17 2017  BP:  (!) 151/52  (!) 165/68  Pulse:  70  85  Resp:  20  (!) 21  Temp:      TempSrc:      SpO2: 98% 96% 94% (!) 87%  Weight:      Height:        Final Clinical Impressions(s) / ED Diagnoses   Final diagnoses:  Acute bronchitis due to other specified organisms    ED Discharge Orders    None       Jaynie Crumble, PA-C 02/26/17 2048    Tegeler, Canary Brim, MD 02/27/17 1257

## 2017-02-26 NOTE — ED Notes (Signed)
Patient still has not urinated for urine specimen.

## 2017-02-26 NOTE — ED Notes (Signed)
Went to walk patient but patient is still getting treatment. Will return to walk patient when treatment is done.

## 2017-02-26 NOTE — ED Triage Notes (Signed)
Per EMS, presents from home with fever, productive cough with brown/green sputum x 3 days. 1000mg  tylenol at home. Denies N/V/D. 2L O2.

## 2017-03-03 LAB — CULTURE, BLOOD (ROUTINE X 2)
Culture: NO GROWTH
Culture: NO GROWTH
Special Requests: ADEQUATE
Special Requests: ADEQUATE

## 2017-06-13 ENCOUNTER — Encounter (HOSPITAL_COMMUNITY): Payer: Self-pay

## 2017-06-13 ENCOUNTER — Emergency Department (HOSPITAL_COMMUNITY): Payer: Medicare Other

## 2017-06-13 ENCOUNTER — Encounter (HOSPITAL_COMMUNITY): Payer: Self-pay | Admitting: Emergency Medicine

## 2017-06-13 ENCOUNTER — Other Ambulatory Visit: Payer: Self-pay

## 2017-06-13 ENCOUNTER — Emergency Department (HOSPITAL_COMMUNITY)
Admission: EM | Admit: 2017-06-13 | Discharge: 2017-06-13 | Disposition: A | Discharge status: Home / Self Care | Payer: Medicare Other | Source: Home / Self Care | Attending: Emergency Medicine | Admitting: Emergency Medicine

## 2017-06-13 ENCOUNTER — Emergency Department (HOSPITAL_COMMUNITY)
Admission: EM | Admit: 2017-06-13 | Discharge: 2017-06-13 | Disposition: A | Discharge status: Home / Self Care | Payer: Medicare Other | Attending: Emergency Medicine | Admitting: Emergency Medicine

## 2017-06-13 DIAGNOSIS — Z8673 Personal history of transient ischemic attack (TIA), and cerebral infarction without residual deficits: Secondary | ICD-10-CM | POA: Insufficient documentation

## 2017-06-13 DIAGNOSIS — J449 Chronic obstructive pulmonary disease, unspecified: Secondary | ICD-10-CM | POA: Insufficient documentation

## 2017-06-13 DIAGNOSIS — E039 Hypothyroidism, unspecified: Secondary | ICD-10-CM | POA: Insufficient documentation

## 2017-06-13 DIAGNOSIS — Z711 Person with feared health complaint in whom no diagnosis is made: Secondary | ICD-10-CM | POA: Insufficient documentation

## 2017-06-13 DIAGNOSIS — Z7984 Long term (current) use of oral hypoglycemic drugs: Secondary | ICD-10-CM

## 2017-06-13 DIAGNOSIS — Z7982 Long term (current) use of aspirin: Secondary | ICD-10-CM

## 2017-06-13 DIAGNOSIS — R51 Headache: Secondary | ICD-10-CM | POA: Insufficient documentation

## 2017-06-13 DIAGNOSIS — Z87891 Personal history of nicotine dependence: Secondary | ICD-10-CM | POA: Insufficient documentation

## 2017-06-13 DIAGNOSIS — I2510 Atherosclerotic heart disease of native coronary artery without angina pectoris: Secondary | ICD-10-CM | POA: Insufficient documentation

## 2017-06-13 DIAGNOSIS — E119 Type 2 diabetes mellitus without complications: Secondary | ICD-10-CM | POA: Insufficient documentation

## 2017-06-13 DIAGNOSIS — I10 Essential (primary) hypertension: Secondary | ICD-10-CM

## 2017-06-13 DIAGNOSIS — W19XXXA Unspecified fall, initial encounter: Secondary | ICD-10-CM

## 2017-06-13 DIAGNOSIS — I4891 Unspecified atrial fibrillation: Secondary | ICD-10-CM | POA: Insufficient documentation

## 2017-06-13 DIAGNOSIS — Z79899 Other long term (current) drug therapy: Secondary | ICD-10-CM | POA: Insufficient documentation

## 2017-06-13 DIAGNOSIS — Z043 Encounter for examination and observation following other accident: Secondary | ICD-10-CM | POA: Insufficient documentation

## 2017-06-13 LAB — COMPREHENSIVE METABOLIC PANEL
ALBUMIN: 3.7 g/dL (ref 3.5–5.0)
ALT: 15 U/L — ABNORMAL LOW (ref 17–63)
ANION GAP: 8 (ref 5–15)
AST: 22 U/L (ref 15–41)
Alkaline Phosphatase: 43 U/L (ref 38–126)
BILIRUBIN TOTAL: 0.8 mg/dL (ref 0.3–1.2)
BUN: 17 mg/dL (ref 6–20)
CO2: 26 mmol/L (ref 22–32)
Calcium: 9 mg/dL (ref 8.9–10.3)
Chloride: 105 mmol/L (ref 101–111)
Creatinine, Ser: 0.96 mg/dL (ref 0.61–1.24)
GFR calc non Af Amer: >60 mL/min (ref >60)
GLUCOSE: 127 mg/dL — AB (ref 65–99)
Potassium: 4.2 mmol/L (ref 3.5–5.1)
Sodium: 139 mmol/L (ref 135–145)
TOTAL PROTEIN: 6.2 g/dL — AB (ref 6.5–8.1)

## 2017-06-13 LAB — CBC WITH DIFFERENTIAL/PLATELET
BASOS PCT: 1 %
Basophils Absolute: 0 10*3/uL (ref 0.0–0.1)
EOS ABS: 0.4 10*3/uL (ref 0.0–0.7)
EOS PCT: 7 %
HCT: 39.3 % (ref 39.0–52.0)
HEMOGLOBIN: 12.4 g/dL — AB (ref 13.0–17.0)
Lymphocytes Relative: 26 %
Lymphs Abs: 1.5 10*3/uL (ref 0.7–4.0)
MCH: 28.3 pg (ref 26.0–34.0)
MCHC: 31.6 g/dL (ref 30.0–36.0)
MCV: 89.7 fL (ref 78.0–100.0)
Monocytes Absolute: 0.4 10*3/uL (ref 0.1–1.0)
Monocytes Relative: 7 %
NEUTROS PCT: 59 %
Neutro Abs: 3.6 10*3/uL (ref 1.7–7.7)
PLATELETS: 181 10*3/uL (ref 150–400)
RBC: 4.38 MIL/uL (ref 4.22–5.81)
RDW: 14.6 % (ref 11.5–15.5)
WBC: 5.9 10*3/uL (ref 4.0–10.5)

## 2017-06-13 LAB — URINALYSIS, ROUTINE W REFLEX MICROSCOPIC
BILIRUBIN URINE: NEGATIVE
Glucose, UA: NEGATIVE mg/dL
HGB URINE DIPSTICK: NEGATIVE
Ketones, ur: NEGATIVE mg/dL
Leukocytes, UA: NEGATIVE
NITRITE: NEGATIVE
PROTEIN: NEGATIVE mg/dL
SPECIFIC GRAVITY, URINE: 1.009 (ref 1.005–1.030)
pH: 7 (ref 5.0–8.0)

## 2017-06-13 LAB — I-STAT TROPONIN, ED: Troponin i, poc: 0 ng/mL (ref 0.00–0.08)

## 2017-06-13 LAB — CBG MONITORING, ED: GLUCOSE-CAPILLARY: 102 mg/dL — AB (ref 65–99)

## 2017-06-13 NOTE — ED Triage Notes (Signed)
Pt seen here this morning and discharged home. Pt got home and son found him laying in the middle of the floor. Pt reports dizziness prior to falling. No distress noted. Pt AOX4.

## 2017-06-13 NOTE — ED Notes (Signed)
Pt. Ambulated to and from bathroom, gait was good, no shortness of breath or dizziness noted.

## 2017-06-13 NOTE — ED Notes (Signed)
ED Provider at bedside. 

## 2017-06-13 NOTE — ED Provider Notes (Signed)
[facility] Provider Note   CSN: [id number] Arrival date & time: 06/13/17  1637     History   Chief Complaint Chief Complaint  Patient presents with  . Fall    HPI [name] is a 84 y.o. male.  Patient is here for evaluation of a fall which occurred around 2 PM today shortly after he made lunch and ate it.  He walked back to his bedroom, almost got to the bed and then fell while trying to sit on the bed.  He landed on his buttock.  He did not injure himself.  His son was with him, helped him get up, and decided to bring him here for evaluation.  The patient's son thinks that he has a "balance problem."  The patient had been here earlier today for an evaluation of fall which occurred last night.  He had a comprehensive evaluation, which was normal.  At this time the patient feels back to baseline.  He has no further complaints.  He denies headache, neck pain, chest pain, nausea, vomiting, cough, shortness of breath, focal weakness or paresthesia.  There are no other known modifying factors.  HPI  Past Medical History:  Diagnosis Date  . Atrial fibrillation (HCC)   . COPD (chronic obstructive pulmonary disease) (HCC)   . CVA (cerebral infarction)   . Diabetes mellitus without complication (HCC)   . Essential hypertension, benign   . Hard of hearing     Patient Active Problem List   Diagnosis Date Noted  . New onset atrial fibrillation/RVR 05/20/2015  . Acute on chronic respiratory failure with hypoxia (HCC) 05/20/2015  . Chest pain syndrome 05/20/2015  . Acute bronchitis 05/20/2015  . AKI (acute kidney injury) (HCC) 05/20/2015  . RBBB 05/20/2015  . Prolonged Q-T interval on ECG 05/20/2015  . CAD (coronary artery disease) 05/20/2015  . History of CVA (cerebrovascular accident) 05/20/2015  . Atrial fibrillation with RVR (HCC)   . CAP (community acquired pneumonia)   . Elevated troponin   . Viral bronchitis 10/29/2014  . Viral  gastroenteritis 10/29/2014  . Bronchiectasis with acute exacerbation (HCC)   . Fever 10/28/2014  . Unstable angina (HCC) 03/27/2013  . COPD exacerbation (HCC) 03/25/2013  . Diabetes mellitus type II, uncontrolled (HCC) 03/25/2013  . Hypothyroidism 03/25/2013  . BPH (benign prostatic hyperplasia) 03/25/2013  . HTN (hypertension) 03/25/2013    Past Surgical History:  Procedure Laterality Date  . HERNIA REPAIR    . LEFT HEART CATHETERIZATION WITH CORONARY ANGIOGRAM N/A 03/28/2013   Procedure: LEFT HEART CATHETERIZATION WITH CORONARY ANGIOGRAM;  Surgeon: Peter M SwazilandJordan, MD;  [facility];  Service: Cardiovascular;  Laterality: N/A;       Home Medications    Prior to Admission medications   Medication Sig Start Date End Date Taking? Authorizing Provider  albuterol (PROVENTIL HFA;VENTOLIN HFA) 108 (90 Base) MCG/ACT inhaler Inhale 2 puffs into the lungs every 4 (four) hours as needed for wheezing or shortness of breath. 05/17/15  Yes Horton, Mayer Maskerourtney F, MD  aspirin EC 81 MG EC tablet Take 1 tablet (81 mg total) by mouth daily. 03/29/13  Yes Mikhail, Maryann, DO  brimonidine (ALPHAGAN) 0.2 % ophthalmic solution Place 1 drop into both eyes 2 (two) times daily.   Yes [provider]  finasteride (PROSCAR) 5 MG tablet Take 5 mg by mouth daily.   Yes [provider]  hydrochlorothiazide (HYDRODIURIL) 25 MG tablet Take 25 mg by mouth daily.  Yes [provider]  Ipratropium-Albuterol (COMBIVENT RESPIMAT) 20-100 MCG/ACT AERS respimat Inhale 1 puff into the lungs every 6 (six) hours as needed for wheezing.    Yes [provider]  levothyroxine (SYNTHROID, LEVOTHROID) 25 MCG tablet Take 25 mcg by mouth daily before breakfast.   Yes [provider]  lisinopril (PRINIVIL,ZESTRIL) 40 MG tablet Take 40 mg by mouth daily.   Yes [provider]  metFORMIN (GLUCOPHAGE) 500 MG tablet Take 500 mg by mouth 2 (two) times daily with a meal.    Yes  [provider]  metoprolol (LOPRESSOR) 50 MG tablet Take 25 mg by mouth 2 (two) times daily.   Yes [provider]  Multiple Vitamins-Minerals (MULTIVITAMIN WITH MINERALS) tablet Take 1 tablet by mouth daily.   Yes [provider]  pravastatin (PRAVACHOL) 40 MG tablet Take 40 mg by mouth at bedtime.    Yes [provider]  terazosin (HYTRIN) 10 MG capsule Take 10 mg by mouth at bedtime.   Yes [provider]  budesonide-formoterol (SYMBICORT) 80-4.5 MCG/ACT inhaler Inhale 2 puffs into the lungs 2 (two) times daily.    [provider]  predniSONE (DELTASONE) 5 MG tablet Label  & dispense according to the schedule below. 10 Pills PO for 3 days then, 8 Pills PO for 3 days, 6 Pills PO for 3 days, 4 Pills PO for 3 days, 2 Pills PO for 3 days, 1 Pills PO for 3 days, 1/2 Pill  PO for 3 days then STOP. Total 95 pills. Patient not taking: Reported on 02/26/2017 05/22/15   Leroy Sea, MD    Family History History reviewed. No pertinent family history.  Social History Social History   Tobacco Use  . Smoking status: Former Smoker    Types: Cigarettes  . Smokeless tobacco: Former Engineer, water Use Topics  . Alcohol use: No  . Drug use: No     Allergies   Patient has no known allergies.   Review of Systems Review of Systems  All other systems reviewed and are negative.    Physical Exam Updated Vital Signs BP (!) 118/41   Pulse (!) 59   Temp 98.6 F (37 C) (Oral)   Resp 16   SpO2 95%   Physical Exam  Constitutional: He appears well-developed. No distress.  Elderly, frail  HENT:  Head: Normocephalic and atraumatic.  Right Ear: External ear normal.  Left Ear: External ear normal.  Eyes: Conjunctivae and EOM are normal. Pupils are equal, round, and reactive to light.  Neck: Normal range of motion and phonation normal. Neck supple.  Cardiovascular: Normal rate, regular rhythm and normal heart sounds.  Pulmonary/Chest:  Effort normal and breath sounds normal. He exhibits no tenderness and no bony tenderness.  Abdominal: Soft. There is no tenderness.  Musculoskeletal: Normal range of motion.  Normal gait  Neurological: He is alert. No cranial nerve deficit or sensory deficit. He exhibits normal muscle tone. Coordination normal.  Skin: Skin is warm, dry and intact.  Psychiatric: He has a normal mood and affect. His behavior is normal.  Nursing note and vitals reviewed.    ED Treatments / Results  Labs (all labs ordered are listed, but only abnormal results are displayed) Labs Reviewed  CBG MONITORING, ED - Abnormal; Notable for the following components:      Result Value   Glucose-Capillary 102 (*)    All other components within normal limits    EKG  EKG Interpretation  Date/Time:  Wednesday  June 13 2017 17:46:57 EDT Ventricular Rate:  60 PR Interval:  152 QRS Duration: 134 QT Interval:  438 QTC Calculation: 438 R Axis:   -87 Text Interpretation:  Normal sinus rhythm Left axis deviation Right bundle branch block Abnormal ECG Since last tracing rate faster Confirmed by Mancel Bale [phone]) on 06/14/2017 9:43:00 AM       Radiology Dg Chest 2 View  Result Date: 06/13/2017 CLINICAL DATA:  Shortness of breath for 1 month EXAM: CHEST - 2 VIEW COMPARISON:  02/26/2017 FINDINGS: Cardiac shadow is stable. Mild interstitial changes are again noted and stable. No focal infiltrate or sizable effusion is seen. The lungs are hyperinflated consistent with COPD. IMPRESSION: Chronic changes without acute abnormality. Electronically Signed   By: Alcide Clever M.D.   On: 06/13/2017 10:11   Ct Head Wo Contrast  Result Date: 06/13/2017 CLINICAL DATA:  84 year old male with abrupt onset weakness and fall while standing. Weakness resolved after about 5 minutes. Headache. Head trauma. EXAM: CT HEAD WITHOUT CONTRAST TECHNIQUE: Contiguous axial images were obtained from the base of the skull through the vertex  without intravenous contrast. COMPARISON:  09/30/2008 head CT. FINDINGS: Brain: Chronic encephalomalacia in the posterior left MCA territory including involvement of the posterior left insula, left temporal lobe, left parietal lobe. Mild chronic ex vacuo enlargement of the left lateral ventricle. Chronic appearing infarct in the left superior cerebellum is new since 2010 (series 3, image 10). Elsewhere gray-white matter differentiation remains within normal limits. No midline shift, ventriculomegaly, mass effect, evidence of mass lesion, intracranial hemorrhage or evidence of cortically based acute infarction. No other cortical encephalomalacia identified. Vascular: Calcified atherosclerosis at the skull base. No suspicious intracranial vascular hyperdensity. Skull: Stable.  No skull fracture identified. Sinuses/Orbits: Low-density fluid levels and bubbly opacity are new in the bilateral maxillary sinuses and greater on the left. Mild paranasal sinus mucosal thickening elsewhere appears stable since 2010. The bilateral tympanic cavities and mastoids remain clear. Other: No scalp hematoma identified. Orbits soft tissues appears stable. There is an oval mildly hyperdense 16 millimeter round soft tissue nodule in the superficial lobe of the left parotid gland on series 4, image 8. This level is not included on the prior study. The left stylomastoid foramen is normal. IMPRESSION: 1. Progressed but chronic appearing ischemia in the left cerebellum since 2010. Stable chronic posterior left MCA infarct. No acute intracranial abnormality identified. 2. 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 Recommend ENT follow-up. 3. Inflammatory appearing fluid levels and opacity in both maxillary sinuses. Consider mild acute sinusitis. Electronically Signed   By: Odessa Fleming M.D.   On: 06/13/2017 12:25    Procedures Procedures (including critical care  time)  Medications Ordered in ED Medications - No data to display   Initial Impression / Assessment and Plan / ED Course  I have reviewed the triage vital signs and the nursing notes.  Pertinent labs & imaging results that were available during my care of the patient were reviewed by me and considered in my medical decision making (see chart for details).  Clinical Course as of Jun 15 943  Thu Jun 14, 2017  6045 Screening evaluation for weakness and recurrent falls, EKG does not indicate ischemia or infarct.  Heart rate is borderline bradycardic.   [EW]    Clinical Course User Index [EW] Mancel Bale, MD     Patient Vitals for the past 24 hrs:  BP  Temp Temp src Pulse Resp SpO2  06/13/17 2228 (!) 118/41 - - (!) 59 16 95 %  06/13/17 2130 128/72 - - (!) 58 18 95 %  06/13/17 2115 111/81 - - (!) 53 19 95 %  06/13/17 1951 (!) 156/53 - - 60 16 94 %  06/13/17 1842 (!) 165/56 98.6 F (37 C) Oral 85 20 94 %  06/13/17 1734 (!) 156/77 98.6 F (37 C) Oral 60 (!) 22 92 %    At D/C- Reevaluation with update and discussion. After initial assessment and treatment, an updated evaluation reveals he is comfortable at this time and has no further complaints.  Findings discussed with patient and son, all questions were answered.Mancel Bale   MDM-evaluation for recurrent fall, without findings for injury on exam.  Vital signs are reassuring.  EKG does not indicate acute cardiac abnormality.  Patient is debilitated and elderly and has a component of balance issues which is likely multifactorial.  Doubt CVA, ACS, serious bacterial infection or metabolic instability.  Situation and patient are amenable to outpatient follow-up and treatment as needed.  Nursing Notes Reviewed/ Care Coordinated Applicable Imaging Reviewed Interpretation of Laboratory Data incorporated into ED treatment  The patient appears reasonably screened and/or stabilized for discharge and I doubt any other medical condition  or other [facility] requiring further screening, evaluation, or treatment in the ED at this time prior to discharge.  Plan: Home Medications-continue current medications; Home Treatments-push oral intake; return here if the recommended treatment, does not improve the symptoms; Recommended follow up-PCP of choice in 1-2 weeks for checkup and treatment as needed   Final Clinical Impressions(s) / ED Diagnoses   Final diagnoses:  Fall, initial encounter    ED Discharge Orders    None       Mancel Bale, MD 06/14/17 [phone]

## 2017-06-13 NOTE — ED Notes (Addendum)
Pt and son report he fell onto his bottom trying to get into bed. Pt reports this is the 2nd episode. Pt denies pain or hitting his head. Pt is on 81 mg aspirin daily.

## 2017-06-13 NOTE — ED Notes (Signed)
Post void residual bladder scan: 177 ml

## 2017-06-13 NOTE — Discharge Instructions (Signed)
It is important to be careful when you walk and sit down if you feel off balance or dizzy.  Make sure that you are eating and drinking regularly.  Follow-up with a primary care doctor for further evaluation and treatment of the balance problem.  If you do not have a primary care doctor use the resource guide which is attached to help you find a doctor.

## 2017-06-13 NOTE — ED Provider Notes (Signed)
[facility] EMERGENCY DEPARTMENT Provider Note   CSN: [id number] Arrival date & time: 06/13/17  4782     History   Chief Complaint Chief Complaint  Patient presents with  . Fall    HPI [name] is a 84 y.o. male history of COPD, stroke, A. Fib (not anticoagulated), hypertension, diabetes, glaucoma, difficulty hearing, prostate cancer s/p chemotherapy over 10 years ago and unsteady gait who presents to ED after fall at home.   Patient has difficulty hearing  Patient got up to go to bathroom last night and couldn't move his legs and his arms and fell forward. Denies hitting his head or LOC. Denies chest pain, dyspnea, palpitation, diaphoresis, nausea, emesis or abdominal pain. He thinks he is down for about 5 minutes. His son reports baseline gait issue since he had stroke. No prior fall in the last 12 months. He was at his baseline when his son saw him last night. Denies recent illness or new medication. He admits difficulty urinating that is at baseline. He had history of prostate cancer for which he had chemo over 10 years ago.  Currently he totally asymptomatic.  About 70 pack year smoking history. Denies recreational drug use. Admits occasional alcohol. HPI  Past Medical History:  Diagnosis Date  . Atrial fibrillation (HCC)   . COPD (chronic obstructive pulmonary disease) (HCC)   . CVA (cerebral infarction)   . Diabetes mellitus without complication (HCC)   . Essential hypertension, benign   . Hard of hearing     Patient Active Problem List   Diagnosis Date Noted  . New onset atrial fibrillation/RVR 05/20/2015  . Acute on chronic respiratory failure with hypoxia (HCC) 05/20/2015  . Chest pain syndrome 05/20/2015  . Acute bronchitis 05/20/2015  . AKI (acute kidney injury) (HCC) 05/20/2015  . RBBB 05/20/2015  . Prolonged Q-T interval on ECG 05/20/2015  . CAD (coronary artery disease) 05/20/2015  . History of CVA (cerebrovascular accident)  05/20/2015  . Atrial fibrillation with RVR (HCC)   . CAP (community acquired pneumonia)   . Elevated troponin   . Viral bronchitis 10/29/2014  . Viral gastroenteritis 10/29/2014  . Bronchiectasis with acute exacerbation (HCC)   . Fever 10/28/2014  . Unstable angina (HCC) 03/27/2013  . COPD exacerbation (HCC) 03/25/2013  . Diabetes mellitus type II, uncontrolled (HCC) 03/25/2013  . Hypothyroidism 03/25/2013  . BPH (benign prostatic hyperplasia) 03/25/2013  . HTN (hypertension) 03/25/2013    Past Surgical History:  Procedure Laterality Date  . HERNIA REPAIR    . LEFT HEART CATHETERIZATION WITH CORONARY ANGIOGRAM N/A 03/28/2013   Procedure: LEFT HEART CATHETERIZATION WITH CORONARY ANGIOGRAM;  Surgeon: Peter M Swaziland, MD;  Location: [facility] CATH LAB;  Service: Cardiovascular;  Laterality: N/A;       Home Medications    Prior to Admission medications   Medication Sig Start Date End Date Taking? Authorizing Provider  albuterol (PROVENTIL HFA;VENTOLIN HFA) 108 (90 Base) MCG/ACT inhaler Inhale 2 puffs into the lungs every 4 (four) hours as needed for wheezing or shortness of breath. 05/17/15   Horton, Mayer Masker, MD  amLODipine (NORVASC) 5 MG tablet Take 2.5 mg by mouth daily.    [provider]  aspirin EC 81 MG EC tablet Take 1 tablet (81 mg total) by mouth daily. 03/29/13   Mikhail, Nita Sells, DO  budesonide-formoterol (SYMBICORT) 80-4.5 MCG/ACT inhaler Inhale 2 puffs into the lungs 2 (two) times daily.    [provider]  doxycycline (VIBRAMYCIN) 100 MG capsule Take 1  capsule (100 mg total) by mouth 2 (two) times daily. 02/26/17   Kirichenko, Tatyana, PA-C  finasteride (PROSCAR) 5 MG tablet Take 5 mg by mouth daily.    [provider]  hydrochlorothiazide (HYDRODIURIL) 25 MG tablet Take 12.5 mg by mouth daily.    [provider]  Ipratropium-Albuterol (COMBIVENT RESPIMAT) 20-100 MCG/ACT AERS respimat Inhale 1 puff into the lungs every 6 (six) hours as needed  for wheezing.     [provider]  ketotifen (ZADITOR) 0.025 % ophthalmic solution Place 2 drops into both eyes 2 (two) times daily.    [provider]  latanoprost (XALATAN) 0.005 % ophthalmic solution Place 1 drop into both eyes at bedtime.    [provider]  levothyroxine (SYNTHROID, LEVOTHROID) 25 MCG tablet Take 25 mcg by mouth daily before breakfast.    [provider]  lisinopril (PRINIVIL,ZESTRIL) 40 MG tablet Take 40 mg by mouth daily.    [provider]  metFORMIN (GLUCOPHAGE) 500 MG tablet Take 500 mg by mouth 2 (two) times daily with a meal.     [provider]  metoprolol (LOPRESSOR) 50 MG tablet Take 25 mg by mouth 2 (two) times daily.    [provider]  Multiple Vitamins-Minerals (MULTIVITAMIN WITH MINERALS) tablet Take 1 tablet by mouth daily.    [provider]  omeprazole (PRILOSEC) 20 MG capsule Take 20 mg by mouth 2 (two) times daily before a meal.    [provider]  pravastatin (PRAVACHOL) 40 MG tablet Take 40 mg by mouth daily.    [provider]  predniSONE (DELTASONE) 5 MG tablet Label  & dispense according to the schedule below. 10 Pills PO for 3 days then, 8 Pills PO for 3 days, 6 Pills PO for 3 days, 4 Pills PO for 3 days, 2 Pills PO for 3 days, 1 Pills PO for 3 days, 1/2 Pill  PO for 3 days then STOP. Total 95 pills. Patient not taking: Reported on 02/26/2017 05/22/15   Leroy Sea, MD  terazosin (HYTRIN) 10 MG capsule Take 10 mg by mouth at bedtime.    [provider]    Family History History reviewed. No pertinent family history.  Social History Social History   Tobacco Use  . Smoking status: Former Smoker    Types: Cigarettes  . Smokeless tobacco: Former Engineer, water Use Topics  . Alcohol use: No  . Drug use: No     Allergies   Patient has no known allergies.   Review of Systems Review of Systems  Constitutional: Negative for chills,  diaphoresis, fatigue and fever.  HENT: Positive for hearing loss. Negative for congestion, rhinorrhea, sore throat and trouble swallowing.   Eyes: Positive for visual disturbance. Negative for pain.       Glaucoma with peripheral vision loss  Respiratory: Positive for wheezing. Negative for cough, chest tightness and shortness of breath.   Cardiovascular: Negative for chest pain, palpitations and leg swelling.  Gastrointestinal: Negative for abdominal pain, blood in stool, diarrhea, nausea and vomiting.  Genitourinary: Positive for difficulty urinating. Negative for dysuria and hematuria.  Musculoskeletal: Negative for arthralgias and myalgias.  Skin: Negative for color change and rash.  Neurological: Negative for dizziness, seizures, syncope, light-headedness, numbness and headaches.  Psychiatric/Behavioral: Negative for confusion.  All other systems reviewed and are negative.  Physical Exam Updated Vital Signs BP (!) 157/59   Pulse (!) 44   Temp 98 F (36.7 C) (Oral)   Resp 20  SpO2 98%   Physical Exam GEN: appears well, no apparent distress. Head: normocephalic and atraumatic  Eyes: conjunctiva without injection, sclera anicteric, PERRLA. Not able to trak finger laterally (history of glaucoma with peripheral vision loss). Arcus senilus Ears: difficulty hearing Oropharynx: mmm without erythema, exudation or petechiae. HEM: negative for cervical or periauricular lymphadenopathies CVS: RRR, nl s1 & s2, no murmurs, no edema RESP: no IWOB, slightly diminished aerations bilaterally, no wheeze or crackles GI: BS present & normal, soft, NTND GU: no suprapubic or CVA tenderness MSK: no focal tenderness or notable swelling SKIN: no apparent skin lesion NEURO: awake, alert and oriented to self, place and person but not time (baseline per son). Cranial nerves II-XII intact but not able to track peripherally, motor 5/5 in all muscle groups of UE and LE bilaterally, normal tone, light  sensation intact in all dermatomes of upper and lower ext bilaterally, patellar and biceps reflexes 1+ bilaterally.  PSYCH: euthymic mood with congruent affect  ED Treatments / Results  Labs (all labs ordered are listed, but only abnormal results are displayed) Labs Reviewed  COMPREHENSIVE METABOLIC PANEL - Abnormal; Notable for the following components:      Result Value   Glucose, Bld 127 (*)    Total Protein 6.2 (*)    ALT 15 (*)    All other components within normal limits  CBC WITH DIFFERENTIAL/PLATELET - Abnormal; Notable for the following components:   Hemoglobin 12.4 (*)    All other components within normal limits  URINALYSIS, ROUTINE W REFLEX MICROSCOPIC  I-STAT TROPONIN, ED    EKG  EKG Interpretation  Date/Time:  Wednesday June 13 2017 09:39:56 EDT Ventricular Rate:  54 PR Interval:  156 QRS Duration: 138 QT Interval:  454 QTC Calculation: 430 R Axis:   -83 Text Interpretation:  Sinus bradycardia Left axis deviation Right bundle branch block Abnormal ECG No significant change since last tracing Confirmed by Jacalyn Lefevre [phone]) on 06/13/2017 10:17:14 AM       Radiology Dg Chest 2 View  Result Date: 06/13/2017 CLINICAL DATA:  Shortness of breath for 1 month EXAM: CHEST - 2 VIEW COMPARISON:  02/26/2017 FINDINGS: Cardiac shadow is stable. Mild interstitial changes are again noted and stable. No focal infiltrate or sizable effusion is seen. The lungs are hyperinflated consistent with COPD. IMPRESSION: Chronic changes without acute abnormality. Electronically Signed   By: Alcide Clever M.D.   On: 06/13/2017 10:11   Ct Head Wo Contrast  Result Date: 06/13/2017 CLINICAL DATA:  84 year old male with abrupt onset weakness and fall while standing. Weakness resolved after about 5 minutes. Headache. Head trauma. EXAM: CT HEAD WITHOUT CONTRAST TECHNIQUE: Contiguous axial images were obtained from the base of the skull through the vertex without intravenous contrast.  COMPARISON:  09/30/2008 head CT. FINDINGS: Brain: Chronic encephalomalacia in the posterior left MCA territory including involvement of the posterior left insula, left temporal lobe, left parietal lobe. Mild chronic ex vacuo enlargement of the left lateral ventricle. Chronic appearing infarct in the left superior cerebellum is new since 2010 (series 3, image 10). Elsewhere gray-white matter differentiation remains within normal limits. No midline shift, ventriculomegaly, mass effect, evidence of mass lesion, intracranial hemorrhage or evidence of cortically based acute infarction. No other cortical encephalomalacia identified. Vascular: Calcified atherosclerosis at the skull base. No suspicious intracranial vascular hyperdensity. Skull: Stable.  No skull fracture identified. Sinuses/Orbits: Low-density fluid levels and bubbly opacity are new in the bilateral maxillary sinuses and greater on the left. Mild paranasal sinus mucosal  thickening elsewhere appears stable since 2010. The bilateral tympanic cavities and mastoids remain clear. Other: No scalp hematoma identified. Orbits soft tissues appears stable. There is an oval mildly hyperdense 16 millimeter round soft tissue nodule in the superficial lobe of the left parotid gland on series 4, image 8. This level is not included on the prior study. The left stylomastoid foramen is normal. IMPRESSION: 1. Progressed but chronic appearing ischemia in the left cerebellum since 2010. Stable chronic posterior left MCA infarct. No acute intracranial abnormality identified. 2. 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 Recommend ENT follow-up. 3. Inflammatory appearing fluid levels and opacity in both maxillary sinuses. Consider mild acute sinusitis. Electronically Signed   By: Odessa FlemingH  Hall M.D.   On: 06/13/2017 12:25    Procedures Procedures (including critical care time)  Medications Ordered in  ED Medications - No data to display  Initial Impression / Assessment and Plan / ED Course  I have reviewed the triage vital signs and the nursing notes.  Pertinent labs & imaging results that were available during my care of the patient were reviewed by me and considered in my medical decision making (see chart for details).    [name] is a 84 y.o. male history of COPD, stroke, A. Fib (not anticoagulated), hypertension, diabetes, glaucoma, difficulty hearing, prostate cancer s/p chemotherapy over 10 years ago and unsteady gait who presents to ED after fall at home.   Unclear cause of his fall. No focal neuro deficit or cardiopulmonary symptoms. History of unsteady gait. Also history of difficulty urinating (not new). Exam with hearing difficulty, peripheral vision loss and bradycardia. Neuro exam within normal except for diminished hearing and peripheral vision loss. CXR and EKG without acute finding. Initial troponin negative. CBC within normal. Overall, patient is well appearing and at baseline per family. Will obtain CT head, urinalysis and PVR  Orthostatic vitals within normal limits.   CT head with chronic appearing ischemia in the left cerebellum and posterior left MCA infarct. No acute intracranial finding. A 15 mm soft tissue nodule in the superficial lobe of the left parotid gland was noted. Some fluid in maxillary sinuses but patient without signs and symptoms of sinusitis. Discussed findings with the patient and family.  Recommended follow-up with ENT on left parotid gland nodule. Urinalysis negative.  Postvoid residual 177 cc. He ambulated without any problem in the room. Will discharge patient. Discussed return precautions. Final Clinical Impressions(s) / ED Diagnoses   Final diagnoses:  Fall, initial encounter    ED Discharge Orders    None       Almon HerculesGonfa, Fortino Haag T, MD 06/13/17 1419    Jacalyn LefevreHaviland, Julie, MD 06/13/17 [phone]

## 2017-06-13 NOTE — ED Triage Notes (Signed)
PT reports he was walking to restroom and states he could not move at all and then fell. Denies hitting head or injuries. He states after about he was able to move again and got up. Patient is very active and still mows yard. Hx of strokes, CA, MIs, COPD. On 2L [place] for while but now on PRN and at night. Currently feels "fine". No complaints except for belly pain that is intermittent and he has had for months. Worse at night. A&O and appears to be in do distress.

## 2017-06-13 NOTE — Discharge Instructions (Signed)
It is nice to meet you today! The test is we have done so far have not shown anything new related to your heart, lungs or brain that could explain you fall. Your urine didn't show infection. There is a small nodule noted in the left parotid gland.  Recommend follow-up with ENT to make sure that this is not a cancer. Please call 911 or return to the emergency department if you have symptoms concerning to you. Take care

## 2018-02-01 ENCOUNTER — Observation Stay (HOSPITAL_BASED_OUTPATIENT_CLINIC_OR_DEPARTMENT_OTHER)
Admission: EM | Admit: 2018-02-01 | Discharge: 2018-02-03 | Disposition: A | Discharge status: Home / Self Care | Payer: Medicare Other | Attending: Internal Medicine | Admitting: Internal Medicine

## 2018-02-01 ENCOUNTER — Encounter (HOSPITAL_BASED_OUTPATIENT_CLINIC_OR_DEPARTMENT_OTHER): Payer: Self-pay | Admitting: *Deleted

## 2018-02-01 ENCOUNTER — Other Ambulatory Visit: Payer: Self-pay

## 2018-02-01 ENCOUNTER — Emergency Department (HOSPITAL_BASED_OUTPATIENT_CLINIC_OR_DEPARTMENT_OTHER): Payer: Medicare Other

## 2018-02-01 DIAGNOSIS — N179 Acute kidney failure, unspecified: Secondary | ICD-10-CM | POA: Insufficient documentation

## 2018-02-01 DIAGNOSIS — Z87891 Personal history of nicotine dependence: Secondary | ICD-10-CM | POA: Diagnosis not present

## 2018-02-01 DIAGNOSIS — J42 Unspecified chronic bronchitis: Secondary | ICD-10-CM

## 2018-02-01 DIAGNOSIS — E1165 Type 2 diabetes mellitus with hyperglycemia: Secondary | ICD-10-CM | POA: Insufficient documentation

## 2018-02-01 DIAGNOSIS — I493 Ventricular premature depolarization: Secondary | ICD-10-CM | POA: Diagnosis not present

## 2018-02-01 DIAGNOSIS — H9190 Unspecified hearing loss, unspecified ear: Secondary | ICD-10-CM | POA: Diagnosis not present

## 2018-02-01 DIAGNOSIS — I10 Essential (primary) hypertension: Secondary | ICD-10-CM | POA: Insufficient documentation

## 2018-02-01 DIAGNOSIS — Z7901 Long term (current) use of anticoagulants: Secondary | ICD-10-CM | POA: Diagnosis not present

## 2018-02-01 DIAGNOSIS — K449 Diaphragmatic hernia without obstruction or gangrene: Secondary | ICD-10-CM | POA: Insufficient documentation

## 2018-02-01 DIAGNOSIS — I480 Paroxysmal atrial fibrillation: Secondary | ICD-10-CM | POA: Diagnosis not present

## 2018-02-01 DIAGNOSIS — J441 Chronic obstructive pulmonary disease with (acute) exacerbation: Secondary | ICD-10-CM | POA: Diagnosis not present

## 2018-02-01 DIAGNOSIS — M8580 Other specified disorders of bone density and structure, unspecified site: Secondary | ICD-10-CM | POA: Insufficient documentation

## 2018-02-01 DIAGNOSIS — J9621 Acute and chronic respiratory failure with hypoxia: Secondary | ICD-10-CM | POA: Insufficient documentation

## 2018-02-01 DIAGNOSIS — IMO0002 Reserved for concepts with insufficient information to code with codable children: Secondary | ICD-10-CM | POA: Diagnosis present

## 2018-02-01 DIAGNOSIS — D649 Anemia, unspecified: Secondary | ICD-10-CM | POA: Insufficient documentation

## 2018-02-01 DIAGNOSIS — Z7951 Long term (current) use of inhaled steroids: Secondary | ICD-10-CM | POA: Insufficient documentation

## 2018-02-01 DIAGNOSIS — I25110 Atherosclerotic heart disease of native coronary artery with unstable angina pectoris: Secondary | ICD-10-CM | POA: Insufficient documentation

## 2018-02-01 DIAGNOSIS — J209 Acute bronchitis, unspecified: Secondary | ICD-10-CM | POA: Diagnosis present

## 2018-02-01 DIAGNOSIS — Z8673 Personal history of transient ischemic attack (TIA), and cerebral infarction without residual deficits: Secondary | ICD-10-CM | POA: Diagnosis not present

## 2018-02-01 DIAGNOSIS — J180 Bronchopneumonia, unspecified organism: Secondary | ICD-10-CM | POA: Diagnosis not present

## 2018-02-01 DIAGNOSIS — Z9981 Dependence on supplemental oxygen: Secondary | ICD-10-CM | POA: Insufficient documentation

## 2018-02-01 DIAGNOSIS — I4891 Unspecified atrial fibrillation: Secondary | ICD-10-CM

## 2018-02-01 DIAGNOSIS — Z79899 Other long term (current) drug therapy: Secondary | ICD-10-CM | POA: Insufficient documentation

## 2018-02-01 DIAGNOSIS — J189 Pneumonia, unspecified organism: Secondary | ICD-10-CM

## 2018-02-01 DIAGNOSIS — Z794 Long term (current) use of insulin: Secondary | ICD-10-CM | POA: Insufficient documentation

## 2018-02-01 DIAGNOSIS — Z7982 Long term (current) use of aspirin: Secondary | ICD-10-CM | POA: Diagnosis not present

## 2018-02-01 DIAGNOSIS — E039 Hypothyroidism, unspecified: Secondary | ICD-10-CM | POA: Insufficient documentation

## 2018-02-01 DIAGNOSIS — J40 Bronchitis, not specified as acute or chronic: Secondary | ICD-10-CM | POA: Diagnosis not present

## 2018-02-01 DIAGNOSIS — F0390 Unspecified dementia without behavioral disturbance: Secondary | ICD-10-CM | POA: Diagnosis not present

## 2018-02-01 DIAGNOSIS — N400 Enlarged prostate without lower urinary tract symptoms: Secondary | ICD-10-CM | POA: Insufficient documentation

## 2018-02-01 DIAGNOSIS — I700 Atherosclerosis of aorta: Secondary | ICD-10-CM | POA: Diagnosis not present

## 2018-02-01 DIAGNOSIS — K219 Gastro-esophageal reflux disease without esophagitis: Secondary | ICD-10-CM | POA: Insufficient documentation

## 2018-02-01 DIAGNOSIS — J9601 Acute respiratory failure with hypoxia: Secondary | ICD-10-CM | POA: Diagnosis present

## 2018-02-01 LAB — CBC WITH DIFFERENTIAL/PLATELET
Abs Immature Granulocytes: 0.03 10*3/uL (ref 0.00–0.07)
BASOS PCT: 1 %
Basophils Absolute: 0.1 10*3/uL (ref 0.0–0.1)
EOS ABS: 0.2 10*3/uL (ref 0.0–0.5)
Eosinophils Relative: 2 %
HCT: 38.2 % — ABNORMAL LOW (ref 39.0–52.0)
Hemoglobin: 12.2 g/dL — ABNORMAL LOW (ref 13.0–17.0)
IMMATURE GRANULOCYTES: 0 %
Lymphocytes Relative: 13 %
Lymphs Abs: 1.2 10*3/uL (ref 0.7–4.0)
MCH: 28.4 pg (ref 26.0–34.0)
MCHC: 31.9 g/dL (ref 30.0–36.0)
MCV: 89 fL (ref 80.0–100.0)
MONOS PCT: 10 %
Monocytes Absolute: 0.9 10*3/uL (ref 0.1–1.0)
NEUTROS PCT: 74 %
NRBC: 0 % (ref 0.0–0.2)
Neutro Abs: 6.8 10*3/uL (ref 1.7–7.7)
PLATELETS: 183 10*3/uL (ref 150–400)
RBC: 4.29 MIL/uL (ref 4.22–5.81)
RDW: 14 % (ref 11.5–15.5)
WBC: 9.2 10*3/uL (ref 4.0–10.5)

## 2018-02-01 LAB — COMPREHENSIVE METABOLIC PANEL
ALK PHOS: 40 U/L (ref 38–126)
ALT: 16 U/L (ref 0–44)
ANION GAP: 13 (ref 5–15)
AST: 22 U/L (ref 15–41)
Albumin: 3.6 g/dL (ref 3.5–5.0)
BUN: 21 mg/dL (ref 8–23)
CALCIUM: 9.1 mg/dL (ref 8.9–10.3)
CO2: 23 mmol/L (ref 22–32)
Chloride: 102 mmol/L (ref 98–111)
Creatinine, Ser: 1.36 mg/dL — ABNORMAL HIGH (ref 0.61–1.24)
GFR calc non Af Amer: 45 mL/min — ABNORMAL LOW (ref >60)
GFR, EST AFRICAN AMERICAN: 52 mL/min — AB (ref >60)
GLUCOSE: 192 mg/dL — AB (ref 70–99)
Potassium: 3.6 mmol/L (ref 3.5–5.1)
Sodium: 138 mmol/L (ref 135–145)
Total Bilirubin: 0.7 mg/dL (ref 0.3–1.2)
Total Protein: 6.4 g/dL — ABNORMAL LOW (ref 6.5–8.1)

## 2018-02-01 LAB — I-STAT CG4 LACTIC ACID, ED: Lactic Acid, Venous: 1.23 mmol/L (ref 0.5–1.9)

## 2018-02-01 MED ORDER — SODIUM CHLORIDE 0.9 % IV SOLN
INTRAVENOUS | Status: DC | PRN
  Administered 2018-02-01: 500 mL via INTRAVENOUS

## 2018-02-01 MED ORDER — SODIUM CHLORIDE 0.9 % IV SOLN
1.0000 g | Freq: Once | INTRAVENOUS | Status: AC
  Administered 2018-02-01: 1 g via INTRAVENOUS
  Filled 2018-02-01: qty 10

## 2018-02-01 NOTE — ED Triage Notes (Signed)
Family states Uri symptoms x 1 week with fever

## 2018-02-01 NOTE — ED Provider Notes (Addendum)
[facility] EMERGENCY DEPARTMENT Provider Note   CSN: [id number] Arrival date & time: 02/01/18  2106     History   Chief Complaint Chief Complaint  Patient presents with  . URI    HPI [name] is a 84 y.o. male.  HPI Level 5 caveat due to previous stroke. Patient presented after fevers.  Reportedly fevers up to 102 today.  Has had a little bit of a cough.  Does have history of COPD and is oxygen at home as needed.  No dysuria.  Cough is been worse over the last week.  Has had some more confusion with it.  Somewhat difficult to get history from due to previous stroke.   Past Medical History:  Diagnosis Date  . Atrial fibrillation (HCC)   . COPD (chronic obstructive pulmonary disease) (HCC)   . CVA (cerebral infarction)   . Diabetes mellitus without complication (HCC)   . Essential hypertension, benign   . Hard of hearing     Patient Active Problem List   Diagnosis Date Noted  . New onset atrial fibrillation/RVR 05/20/2015  . Acute on chronic respiratory failure with hypoxia (HCC) 05/20/2015  . Chest pain syndrome 05/20/2015  . Acute bronchitis 05/20/2015  . AKI (acute kidney injury) (HCC) 05/20/2015  . RBBB 05/20/2015  . Prolonged Q-T interval on ECG 05/20/2015  . CAD (coronary artery disease) 05/20/2015  . History of CVA (cerebrovascular accident) 05/20/2015  . Atrial fibrillation with RVR (HCC)   . CAP (community acquired pneumonia)   . Elevated troponin   . Viral bronchitis 10/29/2014  . Viral gastroenteritis 10/29/2014  . Bronchiectasis with acute exacerbation (HCC)   . Fever 10/28/2014  . Unstable angina (HCC) 03/27/2013  . COPD exacerbation (HCC) 03/25/2013  . Diabetes mellitus type II, uncontrolled (HCC) 03/25/2013  . Hypothyroidism 03/25/2013  . BPH (benign prostatic hyperplasia) 03/25/2013  . HTN (hypertension) 03/25/2013    Past Surgical History:  Procedure Laterality Date  . HERNIA REPAIR    . LEFT HEART CATHETERIZATION WITH  CORONARY ANGIOGRAM N/A 03/28/2013   Procedure: LEFT HEART CATHETERIZATION WITH CORONARY ANGIOGRAM;  Surgeon: Peter M Swaziland, MD;  Location: [facility] CATH LAB;  Service: Cardiovascular;  Laterality: N/A;        Home Medications    Prior to Admission medications   Medication Sig Start Date End Date Taking? Authorizing Provider  albuterol (PROVENTIL HFA;VENTOLIN HFA) 108 (90 Base) MCG/ACT inhaler Inhale 2 puffs into the lungs every 4 (four) hours as needed for wheezing or shortness of breath. 05/17/15   Shon Baton, MD  aspirin EC 81 MG EC tablet Take 1 tablet (81 mg total) by mouth daily. 03/29/13   Mikhail, Nita Sells, DO  brimonidine (ALPHAGAN) 0.2 % ophthalmic solution Place 1 drop into both eyes 2 (two) times daily.    [provider]  budesonide-formoterol (SYMBICORT) 80-4.5 MCG/ACT inhaler Inhale 2 puffs into the lungs 2 (two) times daily.    [provider]  finasteride (PROSCAR) 5 MG tablet Take 5 mg by mouth daily.    [provider]  hydrochlorothiazide (HYDRODIURIL) 25 MG tablet Take 25 mg by mouth daily.     [provider]  Ipratropium-Albuterol (COMBIVENT RESPIMAT) 20-100 MCG/ACT AERS respimat Inhale 1 puff into the lungs every 6 (six) hours as needed for wheezing.     [provider]  levothyroxine (SYNTHROID, LEVOTHROID) 25 MCG tablet Take 25 mcg by mouth daily before breakfast.    [provider]  lisinopril (PRINIVIL,ZESTRIL) 40 MG tablet  Take 40 mg by mouth daily.    [provider]  metFORMIN (GLUCOPHAGE) 500 MG tablet Take 500 mg by mouth 2 (two) times daily with a meal.     [provider]  metoprolol (LOPRESSOR) 50 MG tablet Take 25 mg by mouth 2 (two) times daily.    [provider]  Multiple Vitamins-Minerals (MULTIVITAMIN WITH MINERALS) tablet Take 1 tablet by mouth daily.    [provider]  pravastatin (PRAVACHOL) 40 MG tablet Take 40 mg by mouth at bedtime.     [provider]   predniSONE (DELTASONE) 5 MG tablet Label  & dispense according to the schedule below. 10 Pills PO for 3 days then, 8 Pills PO for 3 days, 6 Pills PO for 3 days, 4 Pills PO for 3 days, 2 Pills PO for 3 days, 1 Pills PO for 3 days, 1/2 Pill  PO for 3 days then STOP. Total 95 pills. Patient not taking: Reported on 02/26/2017 05/22/15   Leroy Sea, MD  terazosin (HYTRIN) 10 MG capsule Take 10 mg by mouth at bedtime.    [provider]    Family History History reviewed. No pertinent family history.  Social History Social History   Tobacco Use  . Smoking status: Former Smoker    Types: Cigarettes  . Smokeless tobacco: Former Engineer, water Use Topics  . Alcohol use: No  . Drug use: No     Allergies   Patient has no known allergies.   Review of Systems Review of Systems  Unable to perform ROS: Dementia  Constitutional: Positive for appetite change and fever.  Respiratory: Positive for cough.      Physical Exam Updated Vital Signs BP (!) 142/80   Pulse 60   Temp 99 F (37.2 C) (Oral)   Resp 20   Ht 5\' 7"  (1.702 m)   Wt 72.6 kg   SpO2 91%   BMI 25.07 kg/m   Physical Exam  Constitutional: He appears well-developed.  HENT:  Head: Normocephalic.  Eyes: Right eye exhibits no discharge. Left eye exhibits no discharge.  Neck: Neck supple.  Cardiovascular:  Irregular rhythm  Pulmonary/Chest:  Somewhat diffuse harsh breath sounds but worse in the right mid lung field.  Abdominal: There is no tenderness.  Musculoskeletal: He exhibits no tenderness.  Neurological: He is alert.  Mild confusion.  Reportedly this is baseline.  Skin: Skin is warm. Capillary refill takes less than 2 seconds.     ED Treatments / Results  Labs (all labs ordered are listed, but only abnormal results are displayed) Labs Reviewed  COMPREHENSIVE METABOLIC PANEL - Abnormal; Notable for the following components:      Result Value   Glucose, Bld 192 (*)    Creatinine, Ser 1.36  (*)    Total Protein 6.4 (*)    GFR calc non Af Amer 45 (*)    GFR calc Af Amer 52 (*)    All other components within normal limits  CBC WITH DIFFERENTIAL/PLATELET - Abnormal; Notable for the following components:   Hemoglobin 12.2 (*)    HCT 38.2 (*)    All other components within normal limits  URINALYSIS, ROUTINE W REFLEX MICROSCOPIC  I-STAT CG4 LACTIC ACID, ED  I-STAT CG4 LACTIC ACID, ED    EKG EKG Interpretation  Date/Time:  Friday February 01 2018 21:54:54 EST Ventricular Rate:  88 PR Interval:    QRS Duration: 142 QT Interval:  402 QTC Calculation: 487 R Axis:   -83  Text Interpretation:  Atrial fibrillation Multiple ventricular premature complexes RBBB and LAFB Confirmed by Benjiman Core [phone]) on 02/01/2018 10:01:25 PM   Radiology Dg Chest 2 View  Result Date: 02/01/2018 CLINICAL DATA:  Cough and fever with congestion for several days. History of COPD. EXAM: CHEST - 2 VIEW COMPARISON:  Chest radiograph June 13, 2017 FINDINGS: Cardiac silhouette is mildly enlarged and unchanged. Mediastinal silhouette is not suspicious. Diffuse interstitial prominence and bibasilar atelectasis/scarring stable from prior examination. No pleural effusion or focal consolidation. No pneumothorax. Increased lung volumes with flattened hemidiaphragms. Osteopenia. IMPRESSION: 1. No acute cardiopulmonary process. 2. COPD and bibasilar atelectasis/scarring. Electronically Signed   By: Awilda Metro M.D.   On: 02/01/2018 22:09    Procedures Procedures (including critical care time)  Medications Ordered in ED Medications  cefTRIAXone (ROCEPHIN) 1 g in sodium chloride 0.9 % 100 mL IVPB (1 g Intravenous New Bag/Given 02/01/18 2305)  0.9 %  sodium chloride infusion (500 mLs Intravenous New Bag/Given 02/01/18 2304)     Initial Impression / Assessment and Plan / ED Course  I have reviewed the triage vital signs and the nursing notes.  Pertinent labs & imaging results that were available  during my care of the patient were reviewed by me and considered in my medical decision making (see chart for details).    CHA2DS2/VAS Stroke Risk Points  Current as of 28 minutes ago     5 >= 2 Points: High Risk  1 - 1.99 Points: Medium Risk  0 Points: Low Risk    This is the only CHA2DS2/VAS Stroke Risk Points available for the past  year.:  Last Change: N/A     Details    This score determines the patient's risk of having a stroke if the  patient has atrial fibrillation.       Points Metrics  0 Has Congestive Heart Failure:  No    Current as of 28 minutes ago  1 Has Vascular Disease:  Yes    Current as of 28 minutes ago  1 Has Hypertension:  Yes    Current as of 28 minutes ago  2 Age:  73    Current as of 28 minutes ago  1 Has Diabetes:  Yes    Current as of 28 minutes ago  1 Had Stroke:  yes  Had TIA:  No  Had thromboembolism:  No    Current as of 28 minutes ago  0 Male:  No    Current as of 28 minutes ago          Patient with fever.  Has had some cough but x-rays reassuring.  However on physical exam there are some localizing lung findings on the right.  Clinically could be pneumonia.  Has underlying COPD with oxygen as needed.  With creatinine going mildly increased and comorbidities I feel patient would probably benefit from admission to hospital.  Urine still pending.  Care will be turned over to Dr. Read Drivers.  Lactic acid reassuring Patient is in atrial fibrillation and per family members reportedly and chronically.  Final Clinical Impressions(s) / ED Diagnoses   Final diagnoses:  Pneumonia of right lung due to infectious organism, unspecified part of lung  Atrial fibrillation, unspecified type [facility])    ED Discharge Orders    None       Benjiman Core, MD 02/01/18 [id number]    Benjiman Core, MD 02/01/18 2310

## 2018-02-01 NOTE — ED Notes (Signed)
Patient transported to X-ray 

## 2018-02-02 ENCOUNTER — Encounter (HOSPITAL_COMMUNITY): Payer: Self-pay | Admitting: *Deleted

## 2018-02-02 ENCOUNTER — Other Ambulatory Visit: Payer: Self-pay

## 2018-02-02 ENCOUNTER — Observation Stay (HOSPITAL_COMMUNITY): Payer: Medicare Other

## 2018-02-02 DIAGNOSIS — J440 Chronic obstructive pulmonary disease with acute lower respiratory infection: Secondary | ICD-10-CM

## 2018-02-02 DIAGNOSIS — Z794 Long term (current) use of insulin: Secondary | ICD-10-CM | POA: Diagnosis not present

## 2018-02-02 DIAGNOSIS — Z7901 Long term (current) use of anticoagulants: Secondary | ICD-10-CM | POA: Diagnosis not present

## 2018-02-02 DIAGNOSIS — J441 Chronic obstructive pulmonary disease with (acute) exacerbation: Secondary | ICD-10-CM

## 2018-02-02 DIAGNOSIS — J9601 Acute respiratory failure with hypoxia: Secondary | ICD-10-CM | POA: Diagnosis not present

## 2018-02-02 DIAGNOSIS — I493 Ventricular premature depolarization: Secondary | ICD-10-CM | POA: Diagnosis not present

## 2018-02-02 DIAGNOSIS — E1165 Type 2 diabetes mellitus with hyperglycemia: Secondary | ICD-10-CM

## 2018-02-02 DIAGNOSIS — J42 Unspecified chronic bronchitis: Secondary | ICD-10-CM | POA: Diagnosis present

## 2018-02-02 DIAGNOSIS — E039 Hypothyroidism, unspecified: Secondary | ICD-10-CM | POA: Diagnosis not present

## 2018-02-02 DIAGNOSIS — Z79899 Other long term (current) drug therapy: Secondary | ICD-10-CM | POA: Diagnosis not present

## 2018-02-02 DIAGNOSIS — J9621 Acute and chronic respiratory failure with hypoxia: Secondary | ICD-10-CM | POA: Diagnosis not present

## 2018-02-02 DIAGNOSIS — J180 Bronchopneumonia, unspecified organism: Secondary | ICD-10-CM | POA: Diagnosis not present

## 2018-02-02 DIAGNOSIS — K219 Gastro-esophageal reflux disease without esophagitis: Secondary | ICD-10-CM | POA: Diagnosis not present

## 2018-02-02 DIAGNOSIS — Z8673 Personal history of transient ischemic attack (TIA), and cerebral infarction without residual deficits: Secondary | ICD-10-CM | POA: Diagnosis not present

## 2018-02-02 DIAGNOSIS — I700 Atherosclerosis of aorta: Secondary | ICD-10-CM | POA: Diagnosis not present

## 2018-02-02 DIAGNOSIS — J209 Acute bronchitis, unspecified: Secondary | ICD-10-CM

## 2018-02-02 DIAGNOSIS — N179 Acute kidney failure, unspecified: Secondary | ICD-10-CM | POA: Diagnosis not present

## 2018-02-02 DIAGNOSIS — Z87891 Personal history of nicotine dependence: Secondary | ICD-10-CM | POA: Diagnosis not present

## 2018-02-02 DIAGNOSIS — I4891 Unspecified atrial fibrillation: Secondary | ICD-10-CM | POA: Diagnosis present

## 2018-02-02 DIAGNOSIS — K449 Diaphragmatic hernia without obstruction or gangrene: Secondary | ICD-10-CM | POA: Diagnosis not present

## 2018-02-02 DIAGNOSIS — H9190 Unspecified hearing loss, unspecified ear: Secondary | ICD-10-CM | POA: Diagnosis not present

## 2018-02-02 DIAGNOSIS — I10 Essential (primary) hypertension: Secondary | ICD-10-CM

## 2018-02-02 DIAGNOSIS — J40 Bronchitis, not specified as acute or chronic: Secondary | ICD-10-CM | POA: Diagnosis not present

## 2018-02-02 DIAGNOSIS — F0390 Unspecified dementia without behavioral disturbance: Secondary | ICD-10-CM | POA: Diagnosis not present

## 2018-02-02 DIAGNOSIS — I25110 Atherosclerotic heart disease of native coronary artery with unstable angina pectoris: Secondary | ICD-10-CM | POA: Diagnosis not present

## 2018-02-02 DIAGNOSIS — I480 Paroxysmal atrial fibrillation: Secondary | ICD-10-CM | POA: Diagnosis not present

## 2018-02-02 DIAGNOSIS — Z7951 Long term (current) use of inhaled steroids: Secondary | ICD-10-CM | POA: Diagnosis not present

## 2018-02-02 DIAGNOSIS — D649 Anemia, unspecified: Secondary | ICD-10-CM | POA: Diagnosis not present

## 2018-02-02 DIAGNOSIS — Z7982 Long term (current) use of aspirin: Secondary | ICD-10-CM | POA: Diagnosis not present

## 2018-02-02 LAB — TSH: TSH: 5.671 u[IU]/mL — ABNORMAL HIGH (ref 0.350–4.500)

## 2018-02-02 LAB — COMPREHENSIVE METABOLIC PANEL
ALK PHOS: 34 U/L — AB (ref 38–126)
ALT: 15 U/L (ref 0–44)
ANION GAP: 9 (ref 5–15)
AST: 18 U/L (ref 15–41)
Albumin: 3.5 g/dL (ref 3.5–5.0)
BUN: 20 mg/dL (ref 8–23)
CALCIUM: 8.9 mg/dL (ref 8.9–10.3)
CHLORIDE: 106 mmol/L (ref 98–111)
CO2: 25 mmol/L (ref 22–32)
Creatinine, Ser: 1.27 mg/dL — ABNORMAL HIGH (ref 0.61–1.24)
GFR calc non Af Amer: 49 mL/min — ABNORMAL LOW (ref >60)
GFR, EST AFRICAN AMERICAN: 56 mL/min — AB (ref >60)
GLUCOSE: 225 mg/dL — AB (ref 70–99)
POTASSIUM: 3.5 mmol/L (ref 3.5–5.1)
SODIUM: 140 mmol/L (ref 135–145)
Total Bilirubin: 0.7 mg/dL (ref 0.3–1.2)
Total Protein: 5.7 g/dL — ABNORMAL LOW (ref 6.5–8.1)

## 2018-02-02 LAB — CBC WITH DIFFERENTIAL/PLATELET
ABS IMMATURE GRANULOCYTES: 0.05 10*3/uL (ref 0.00–0.07)
BASOS ABS: 0.1 10*3/uL (ref 0.0–0.1)
Basophils Relative: 1 %
Eosinophils Absolute: 0.2 10*3/uL (ref 0.0–0.5)
Eosinophils Relative: 3 %
HEMATOCRIT: 37.1 % — AB (ref 39.0–52.0)
HEMOGLOBIN: 11.4 g/dL — AB (ref 13.0–17.0)
Immature Granulocytes: 1 %
LYMPHS PCT: 16 %
Lymphs Abs: 1.2 10*3/uL (ref 0.7–4.0)
MCH: 28.7 pg (ref 26.0–34.0)
MCHC: 30.7 g/dL (ref 30.0–36.0)
MCV: 93.5 fL (ref 80.0–100.0)
MONOS PCT: 10 %
Monocytes Absolute: 0.7 10*3/uL (ref 0.1–1.0)
NEUTROS ABS: 5.3 10*3/uL (ref 1.7–7.7)
NRBC: 0 % (ref 0.0–0.2)
Neutrophils Relative %: 69 %
Platelets: 164 10*3/uL (ref 150–400)
RBC: 3.97 MIL/uL — ABNORMAL LOW (ref 4.22–5.81)
RDW: 13.7 % (ref 11.5–15.5)
WBC: 7.5 10*3/uL (ref 4.0–10.5)

## 2018-02-02 LAB — URINALYSIS, ROUTINE W REFLEX MICROSCOPIC
BILIRUBIN URINE: NEGATIVE
Glucose, UA: NEGATIVE mg/dL
Ketones, ur: NEGATIVE mg/dL
Leukocytes, UA: NEGATIVE
NITRITE: NEGATIVE
PROTEIN: 30 mg/dL — AB
Specific Gravity, Urine: 1.015 (ref 1.005–1.030)
pH: 6.5 (ref 5.0–8.0)

## 2018-02-02 LAB — D-DIMER, QUANTITATIVE: D-Dimer, Quant: 0.57 ug/mL-FEU — ABNORMAL HIGH (ref 0.00–0.50)

## 2018-02-02 LAB — TROPONIN I

## 2018-02-02 LAB — GLUCOSE, CAPILLARY
GLUCOSE-CAPILLARY: 166 mg/dL — AB (ref 70–99)
Glucose-Capillary: 188 mg/dL — ABNORMAL HIGH (ref 70–99)
Glucose-Capillary: 131 mg/dL — ABNORMAL HIGH (ref 70–99)
Glucose-Capillary: 221 mg/dL — ABNORMAL HIGH (ref 70–99)

## 2018-02-02 LAB — INFLUENZA PANEL BY PCR (TYPE A & B)
INFLAPCR: NEGATIVE
INFLBPCR: NEGATIVE

## 2018-02-02 LAB — URINALYSIS, MICROSCOPIC (REFLEX)

## 2018-02-02 LAB — MAGNESIUM: MAGNESIUM: 2 mg/dL (ref 1.7–2.4)

## 2018-02-02 MED ORDER — INSULIN ASPART 100 UNIT/ML ~~LOC~~ SOLN
0.0000 [IU] | Freq: Three times a day (TID) | SUBCUTANEOUS | Status: DC
  Administered 2018-02-02: 2 [IU] via SUBCUTANEOUS
  Administered 2018-02-02: 3 [IU] via SUBCUTANEOUS
  Administered 2018-02-02 – 2018-02-03 (×3): 1 [IU] via SUBCUTANEOUS

## 2018-02-02 MED ORDER — ENOXAPARIN SODIUM 40 MG/0.4ML ~~LOC~~ SOLN
40.0000 mg | SUBCUTANEOUS | Status: DC
  Administered 2018-02-02: 40 mg via SUBCUTANEOUS
  Filled 2018-02-02: qty 0.4

## 2018-02-02 MED ORDER — MOMETASONE FURO-FORMOTEROL FUM 100-5 MCG/ACT IN AERO
2.0000 | INHALATION_SPRAY | Freq: Two times a day (BID) | RESPIRATORY_TRACT | Status: DC
  Administered 2018-02-02 – 2018-02-03 (×2): 2 via RESPIRATORY_TRACT
  Filled 2018-02-02: qty 8.8

## 2018-02-02 MED ORDER — POTASSIUM CHLORIDE CRYS ER 20 MEQ PO TBCR
40.0000 meq | EXTENDED_RELEASE_TABLET | Freq: Two times a day (BID) | ORAL | Status: AC
  Administered 2018-02-02 – 2018-02-03 (×2): 40 meq via ORAL
  Filled 2018-02-02 (×2): qty 2

## 2018-02-02 MED ORDER — IPRATROPIUM BROMIDE 0.02 % IN SOLN
0.5000 mg | Freq: Three times a day (TID) | RESPIRATORY_TRACT | Status: DC
  Administered 2018-02-02 – 2018-02-03 (×3): 0.5 mg via RESPIRATORY_TRACT
  Filled 2018-02-02 (×3): qty 2.5

## 2018-02-02 MED ORDER — HYDRALAZINE HCL 20 MG/ML IJ SOLN: 5.0000 mg | INTRAMUSCULAR | Status: DC | PRN

## 2018-02-02 MED ORDER — LEVALBUTEROL HCL 0.63 MG/3ML IN NEBU: 0.6300 mg | INHALATION_SOLUTION | Freq: Four times a day (QID) | RESPIRATORY_TRACT | Status: DC | PRN

## 2018-02-02 MED ORDER — GUAIFENESIN ER 600 MG PO TB12
1200.0000 mg | ORAL_TABLET | Freq: Two times a day (BID) | ORAL | Status: DC
  Administered 2018-02-02 – 2018-02-03 (×3): 1200 mg via ORAL
  Filled 2018-02-02 (×4): qty 2

## 2018-02-02 MED ORDER — IOPAMIDOL (ISOVUE-370) INJECTION 76%
100.0000 mL | Freq: Once | INTRAVENOUS | Status: AC | PRN
  Administered 2018-02-02: 100 mL via INTRAVENOUS

## 2018-02-02 MED ORDER — IPRATROPIUM-ALBUTEROL 0.5-2.5 (3) MG/3ML IN SOLN
3.0000 mL | RESPIRATORY_TRACT | Status: DC
  Administered 2018-02-02: 3 mL via RESPIRATORY_TRACT
  Filled 2018-02-02: qty 3

## 2018-02-02 MED ORDER — LEVOTHYROXINE SODIUM 25 MCG PO TABS
25.0000 ug | ORAL_TABLET | Freq: Every day | ORAL | Status: DC
  Administered 2018-02-03: 25 ug via ORAL
  Filled 2018-02-02: qty 1

## 2018-02-02 MED ORDER — PRAVASTATIN SODIUM 40 MG PO TABS
40.0000 mg | ORAL_TABLET | Freq: Every day | ORAL | Status: DC
  Administered 2018-02-02: 40 mg via ORAL
  Filled 2018-02-02: qty 1

## 2018-02-02 MED ORDER — ADULT MULTIVITAMIN W/MINERALS CH
1.0000 | ORAL_TABLET | Freq: Every day | ORAL | Status: DC
  Administered 2018-02-02 – 2018-02-03 (×2): 1 via ORAL
  Filled 2018-02-02 (×2): qty 1

## 2018-02-02 MED ORDER — BRIMONIDINE TARTRATE 0.2 % OP SOLN
1.0000 [drp] | Freq: Two times a day (BID) | OPHTHALMIC | Status: DC
  Administered 2018-02-02 – 2018-02-03 (×2): 1 [drp] via OPHTHALMIC
  Filled 2018-02-02: qty 5

## 2018-02-02 MED ORDER — LEVALBUTEROL HCL 0.63 MG/3ML IN NEBU
0.6300 mg | INHALATION_SOLUTION | Freq: Three times a day (TID) | RESPIRATORY_TRACT | Status: DC
  Administered 2018-02-02 – 2018-02-03 (×3): 0.63 mg via RESPIRATORY_TRACT
  Filled 2018-02-02 (×3): qty 3

## 2018-02-02 MED ORDER — ASPIRIN EC 81 MG PO TBEC
81.0000 mg | DELAYED_RELEASE_TABLET | Freq: Every day | ORAL | Status: DC
  Administered 2018-02-02: 81 mg via ORAL
  Filled 2018-02-02: qty 1

## 2018-02-02 MED ORDER — ACETAMINOPHEN 325 MG PO TABS: 650.0000 mg | ORAL_TABLET | Freq: Four times a day (QID) | ORAL | Status: DC | PRN

## 2018-02-02 MED ORDER — SODIUM CHLORIDE (PF) 0.9 % IJ SOLN
INTRAMUSCULAR | Status: AC
  Filled 2018-02-02: qty 50

## 2018-02-02 MED ORDER — TERAZOSIN HCL 5 MG PO CAPS
10.0000 mg | ORAL_CAPSULE | Freq: Every day | ORAL | Status: DC
  Administered 2018-02-02: 10 mg via ORAL
  Filled 2018-02-02: qty 2

## 2018-02-02 MED ORDER — LISINOPRIL 20 MG PO TABS
40.0000 mg | ORAL_TABLET | Freq: Every day | ORAL | Status: DC
  Administered 2018-02-02 – 2018-02-03 (×2): 40 mg via ORAL
  Filled 2018-02-02 (×2): qty 2

## 2018-02-02 MED ORDER — SODIUM CHLORIDE 0.9 % IV SOLN
INTRAVENOUS | Status: AC
  Administered 2018-02-02 – 2018-02-03 (×3): via INTRAVENOUS

## 2018-02-02 MED ORDER — IOPAMIDOL (ISOVUE-370) INJECTION 76%
INTRAVENOUS | Status: AC
  Filled 2018-02-02: qty 100

## 2018-02-02 MED ORDER — SODIUM CHLORIDE 0.9 % IV SOLN
100.0000 mg | Freq: Two times a day (BID) | INTRAVENOUS | Status: DC
  Administered 2018-02-02 – 2018-02-03 (×3): 100 mg via INTRAVENOUS
  Filled 2018-02-02 (×4): qty 100

## 2018-02-02 MED ORDER — FINASTERIDE 5 MG PO TABS
5.0000 mg | ORAL_TABLET | Freq: Every day | ORAL | Status: DC
  Administered 2018-02-02 – 2018-02-03 (×2): 5 mg via ORAL
  Filled 2018-02-02 (×2): qty 1

## 2018-02-02 MED ORDER — ACETAMINOPHEN 650 MG RE SUPP: 650.0000 mg | Freq: Four times a day (QID) | RECTAL | Status: DC | PRN

## 2018-02-02 MED ORDER — IPRATROPIUM-ALBUTEROL 0.5-2.5 (3) MG/3ML IN SOLN: 3.0000 mL | Freq: Three times a day (TID) | RESPIRATORY_TRACT | Status: DC

## 2018-02-02 MED ORDER — LEVALBUTEROL HCL 0.63 MG/3ML IN NEBU
0.6300 mg | INHALATION_SOLUTION | Freq: Four times a day (QID) | RESPIRATORY_TRACT | Status: DC
  Administered 2018-02-02: 0.63 mg via RESPIRATORY_TRACT
  Filled 2018-02-02 (×2): qty 3

## 2018-02-02 NOTE — Progress Notes (Signed)
Patient having sustained bigeminy.  MD notified.  Will continue to monitor.

## 2018-02-02 NOTE — ED Notes (Signed)
Patient attempted multiple times to obtain urine sample; patient was straight cathed for urine sample; tolerated well. 30 mls of dark amber urine noted.

## 2018-02-02 NOTE — Progress Notes (Signed)
Patient was admitted early this morning after midnight and H&P has been reviewed and I am in current agreement with assessment and plan done by Dr. Midge Minium.  Additional changes the plan of care been made accordingly.  The patient is an 84 year old elderly Caucasian male with past medical history significant for COPD, diabetes mellitus, history of CVA, atrial fibrillation, hypertension, dementia and other comorbidities who was brought to the hospital secondary to having a fever of 102.  Patient also complained of being short of breath.  Upon evaluation the ED he was found to be wheezing and was given a nebulizer treatment.  There was a concern for a possible pneumonia because there was some localizing sign on exam of the right side for the patient was given an antibiotic for.  Chest x-ray was unremarkable and EKG showed A. Fib.  Patient was admitted for acute respiratory failure with hypoxia in setting of possible COPD exacerbation as well as pneumonia.  Continue IV doxycycline and troponin and d-dimer checked.  D-dimer slightly elevated so CTA of the chest was obtained which showed no evidence of any pulmonary embolism but there is bilateral lower lobe bronchial wall thickening and patchy peribronchial opacities there is consistent with bronchitis and probable bronchopneumonia.  No other acute abnormality noted and there is also moderate centrilobular emphysema along with aortic atherosclerosis and coronary artery calcifications.  Patient was placed on Xopenex and Atrovent which will continue now and he is on a 3 times daily.  I have also started guaifenesin 1200 mg p.o. twice daily, flutter valve and incentive spirometry.  She was weaned off of oxygen and I saw this morning.  We will continue to monitor patient's clinical response to intervention and then repeat chest x-ray in the a.m along with blood work. and assess how the patient is doing respiratory wise.  Will need to obtain an home amatory screen  to assess the need for oxygen prior to discharge.

## 2018-02-02 NOTE — Progress Notes (Signed)
Received report from [facility] at 0138.  Patient arrived to unitt at 0207.

## 2018-02-02 NOTE — Progress Notes (Signed)
PT demonstrated hands on understanding of Flutter device. Non productive cough at this time. 

## 2018-02-02 NOTE — H&P (Signed)
History and Physical    [name] ZOX:[id number] DOB: 1927/09/19 DOA: 02/01/2018  PCP: [facility]  Patient coming from: Patient was transferred from an [facility].  Chief Complaint: Fever and shortness of breath.  HPI: [name] is a 84 y.o. male with history of COPD, diabetes mellitus, A. fib, hypertension, dementia was brought to the ER by patient's family after patient was found to be febrile with temperature 102 F and also help complaint of shortness of breath.  Patient has dementia and is not contributing to history at this time.  Will need to get further history from family in the morning.  ED Course: In the ER as per the ER physician patient was mildly short of breath and wheezing for which patient was given nebulizer treatment.  There was concern for possible pneumonia because there was some localizing sign on exam of the right side for which patient was given antibiotic.  Labs showed acute renal failure.  Chest x-ray was unremarkable.  EKG shows A. fib with PVCs.  Review of Systems: As per HPI, rest all negative.   Past Medical History:  Diagnosis Date  . Atrial fibrillation (HCC)   . COPD (chronic obstructive pulmonary disease) (HCC)   . CVA (cerebral infarction)   . Diabetes mellitus without complication (HCC)   . Essential hypertension, benign   . Hard of hearing     Past Surgical History:  Procedure Laterality Date  . HERNIA REPAIR    . LEFT HEART CATHETERIZATION WITH CORONARY ANGIOGRAM N/A 03/28/2013   Procedure: LEFT HEART CATHETERIZATION WITH CORONARY ANGIOGRAM;  Surgeon: Peter M Swaziland, MD;  Location: [facility] CATH LAB;  Service: Cardiovascular;  Laterality: N/A;     reports that he has quit smoking. His smoking use included cigarettes. He has quit using smokeless tobacco. He reports that he does not drink alcohol or use drugs.  No Known Allergies  Family History  Family history unknown: Yes    Prior to Admission medications     Medication Sig Start Date End Date Taking? Authorizing Provider  albuterol (PROVENTIL HFA;VENTOLIN HFA) 108 (90 Base) MCG/ACT inhaler Inhale 2 puffs into the lungs every 4 (four) hours as needed for wheezing or shortness of breath. 05/17/15   Shon Baton, MD  aspirin EC 81 MG EC tablet Take 1 tablet (81 mg total) by mouth daily. 03/29/13   Mikhail, Nita Sells, DO  brimonidine (ALPHAGAN) 0.2 % ophthalmic solution Place 1 drop into both eyes 2 (two) times daily.    [provider]  budesonide-formoterol (SYMBICORT) 80-4.5 MCG/ACT inhaler Inhale 2 puffs into the lungs 2 (two) times daily.    [provider]  finasteride (PROSCAR) 5 MG tablet Take 5 mg by mouth daily.    [provider]  hydrochlorothiazide (HYDRODIURIL) 25 MG tablet Take 25 mg by mouth daily.     [provider]  Ipratropium-Albuterol (COMBIVENT RESPIMAT) 20-100 MCG/ACT AERS respimat Inhale 1 puff into the lungs every 6 (six) hours as needed for wheezing.     [provider]  levothyroxine (SYNTHROID, LEVOTHROID) 25 MCG tablet Take 25 mcg by mouth daily before breakfast.    [provider]  lisinopril (PRINIVIL,ZESTRIL) 40 MG tablet Take 40 mg by mouth daily.    [provider]  metFORMIN (GLUCOPHAGE) 500 MG tablet Take 500 mg by mouth 2 (two) times daily with a meal.     [provider]  metoprolol (LOPRESSOR) 50 MG tablet Take 25 mg by mouth 2 (  two) times daily.    [provider]  Multiple Vitamins-Minerals (MULTIVITAMIN WITH MINERALS) tablet Take 1 tablet by mouth daily.    [provider]  pravastatin (PRAVACHOL) 40 MG tablet Take 40 mg by mouth at bedtime.     [provider]  terazosin (HYTRIN) 10 MG capsule Take 10 mg by mouth at bedtime.    [provider]    Physical Exam: Vitals:   02/02/18 0100 02/02/18 0115 02/02/18 0221 02/02/18 0427  BP: 120/63 (!) 145/62 (!) 146/74 140/69  Pulse: 87 85 81 74  Resp: 11 16  (!) 22 18  Temp:  98.1 F (36.7 C) 98.5 F (36.9 C) 98.2 F (36.8 C)  TempSrc:  Oral Oral Oral  SpO2: 91% 94% 98% 95%  Weight:      Height:          Constitutional: Moderately built and nourished. Vitals:   02/02/18 0100 02/02/18 0115 02/02/18 0221 02/02/18 0427  BP: 120/63 (!) 145/62 (!) 146/74 140/69  Pulse: 87 85 81 74  Resp: 11 16 (!) 22 18  Temp:  98.1 F (36.7 C) 98.5 F (36.9 C) 98.2 F (36.8 C)  TempSrc:  Oral Oral Oral  SpO2: 91% 94% 98% 95%  Weight:      Height:       Eyes: Anicteric no pallor. ENMT: No discharge from the ears eyes nose or mouth. Neck: No mass or.  No neck rigidity. Respiratory: No rhonchi or crepitations. Cardiovascular: S1-S2 heard no murmurs appreciated. Abdomen: Soft nontender bowel sounds present. Musculoskeletal: No edema. Skin: No rash. Neurologic: Alert awake oriented to his name.  Moves all extremities. Psychiatric: Oriented to his name.    Labs on Admission: I have personally reviewed following labs and imaging studies  CBC: Recent Labs  Lab 02/01/18 2137  WBC 9.2  NEUTROABS 6.8  HGB 12.2*  HCT 38.2*  MCV 89.0  PLT 183   Basic Metabolic Panel: Recent Labs  Lab 02/01/18 2137  NA 138  K 3.6  CL 102  CO2 23  GLUCOSE 192*  BUN 21  CREATININE 1.36*  CALCIUM 9.1   GFR: Estimated Creatinine Clearance: 35.1 mL/min (A) (by C-G formula based on SCr of 1.36 mg/dL (H)). Liver Function Tests: Recent Labs  Lab 02/01/18 2137  AST 22  ALT 16  ALKPHOS 40  BILITOT 0.7  PROT 6.4*  ALBUMIN 3.6   No results for input(s): LIPASE, AMYLASE in the last 168 hours. No results for input(s): AMMONIA in the last 168 hours. Coagulation Profile: No results for input(s): INR, PROTIME in the last 168 hours. Cardiac Enzymes: No results for input(s): CKTOTAL, CKMB, CKMBINDEX, TROPONINI in the last 168 hours. BNP (last 3 results) No results for input(s): PROBNP in the last 8760 hours. HbA1C: No results for input(s): HGBA1C in the  last 72 hours. CBG: No results for input(s): GLUCAP in the last 168 hours. Lipid Profile: No results for input(s): CHOL, HDL, LDLCALC, TRIG, CHOLHDL, LDLDIRECT in the last 72 hours. Thyroid Function Tests: No results for input(s): TSH, T4TOTAL, FREET4, T3FREE, THYROIDAB in the last 72 hours. Anemia Panel: No results for input(s): VITAMINB12, FOLATE, FERRITIN, TIBC, IRON, RETICCTPCT in the last 72 hours. Urine analysis:    Component Value Date/Time   COLORURINE YELLOW 02/02/2018 0013   APPEARANCEUR CLEAR 02/02/2018 0013   LABSPEC 1.015 02/02/2018 0013   PHURINE 6.5 02/02/2018 0013   GLUCOSEU NEGATIVE 02/02/2018 0013   HGBUR LARGE (A) 02/02/2018 0013   BILIRUBINUR NEGATIVE 02/02/2018 0013  KETONESUR NEGATIVE 02/02/2018 0013   PROTEINUR 30 (A) 02/02/2018 0013   UROBILINOGEN 1.0 10/28/2014 1628   NITRITE NEGATIVE 02/02/2018 0013   LEUKOCYTESUR NEGATIVE 02/02/2018 0013   Sepsis Labs: @LABRCNTIP (procalcitonin:4,lacticidven:4) )No results found for this or any previous visit (from the past 240 hour(s)).   Radiological Exams on Admission: Dg Chest 2 View  Result Date: 02/01/2018 CLINICAL DATA:  Cough and fever with congestion for several days. History of COPD. EXAM: CHEST - 2 VIEW COMPARISON:  Chest radiograph June 13, 2017 FINDINGS: Cardiac silhouette is mildly enlarged and unchanged. Mediastinal silhouette is not suspicious. Diffuse interstitial prominence and bibasilar atelectasis/scarring stable from prior examination. No pleural effusion or focal consolidation. No pneumothorax. Increased lung volumes with flattened hemidiaphragms. Osteopenia. IMPRESSION: 1. No acute cardiopulmonary process. 2. COPD and bibasilar atelectasis/scarring. Electronically Signed   By: Awilda Metro M.D.   On: 02/01/2018 22:09    EKG: Independently reviewed.  A. fib with PVCs.  Assessment/Plan Principal Problem:   Acute respiratory failure with hypoxia (HCC) Active Problems:   COPD exacerbation  (HCC)   Diabetes mellitus type II, uncontrolled (HCC)   Hypothyroidism   HTN (hypertension)   Atrial fibrillation (HCC)   History of CVA (cerebrovascular accident)    1. Acute respiratory failure with hypoxia with febrile -possible COPD exacerbation.  Will continue with nebulizer treatment and doxy for now.  Check influenza PCR.  Check troponin and d-dimer.  Will need to get further history from patient's family in the morning. 2. Acute renal failure -follow metabolic panel gently hydrate for now. 3. Hypertension we will keep patient on PRN IV hydralazine for now until we can from home medications. 4. Diabetes mellitus type 2 we will keep patient on sliding scale coverage. 5. History of A. fib presently rate controlled.  Not sure if patient was on anticoagulation previously. 6. Hypothyroidism need to confirm home medications check TSH. 7. Normocytic normochromic anemia follow CBC.   DVT prophylaxis: Lovenox. Code Status: Full code. Family Communication: No family at the bedside. Disposition Plan: Home. Consults called: None. Admission status: Observation.   Eduard Clos MD [facility] Pager [phone].  If 7PM-7AM, please contact night-coverage [email or web address] Password [facility]  02/02/2018, 4:49 AM

## 2018-02-02 NOTE — Evaluation (Signed)
Clinical/Bedside Swallow Evaluation Patient Details  Name: [name] MRN: [id number] Date of Birth: January 20, 1928  Today's Date: 02/02/2018 Time: SLP Start Time (ACUTE ONLY): 1507 SLP Stop Time (ACUTE ONLY): 1515 SLP Time Calculation (min) (ACUTE ONLY): 8 min  Past Medical History:  Past Medical History:  Diagnosis Date  . Atrial fibrillation (HCC)   . COPD (chronic obstructive pulmonary disease) (HCC)   . CVA (cerebral infarction)   . Diabetes mellitus without complication (HCC)   . Essential hypertension, benign   . Hard of hearing    Past Surgical History:  Past Surgical History:  Procedure Laterality Date  . HERNIA REPAIR    . LEFT HEART CATHETERIZATION WITH CORONARY ANGIOGRAM N/A 03/28/2013   Procedure: LEFT HEART CATHETERIZATION WITH CORONARY ANGIOGRAM;  Surgeon: Peter M Swaziland, MD;  Location: [facility] CATH LAB;  Service: Cardiovascular;  Laterality: N/A;   HPI:  Pt is an 84 y.o. male with history of COPD, diabetes mellitus, A. fib, hypertension, and dementia, who was admitted with fever and shortness of breath. CXR was clear but CT is concerning for bronchitis and probable bronchopneumonia.    Assessment / Plan / Recommendation Clinical Impression  SLP provided skilled observation during med administration by RN, with pt taking multiple pills at once with thin liquids via straw to wash them down. No signs of difficulty were observed. Pt ate a package of graham crackers and consumed more water with no overt signs of aspiration. Pt and family deny any swallowing difficulties and say he has no h/o PNA. Recommend continuing current diet. SLP to sign off for now. SLP Visit Diagnosis: Dysphagia, unspecified (R13.10)    Aspiration Risk  Mild aspiration risk    Diet Recommendation Regular;Thin liquid   Liquid Administration via: Cup;Straw Medication Administration: Whole meds with liquid Supervision: Patient able to self feed;Intermittent supervision to cue for compensatory  strategies Compensations: Slow rate;Small sips/bites;Minimize environmental distractions Postural Changes: Seated upright at 90 degrees    Other  Recommendations Oral Care Recommendations: Oral care BID   Follow up Recommendations None      Frequency and Duration            Prognosis        Swallow Study   General HPI: Pt is an 84 y.o. male with history of COPD, diabetes mellitus, A. fib, hypertension, and dementia, who was admitted with fever and shortness of breath. CXR was clear but CT is concerning for bronchitis and probable bronchopneumonia.  Type of Study: Bedside Swallow Evaluation Previous Swallow Assessment: none in chart Diet Prior to this Study: Regular;Thin liquids Temperature Spikes Noted: No Respiratory Status: Room air History of Recent Intubation: No Behavior/Cognition: Alert;Cooperative;Pleasant mood;Other (Comment)(HOH) Oral Care Completed by SLP: No Vision: Functional for self-feeding Self-Feeding Abilities: Able to feed self Patient Positioning: Upright in bed Baseline Vocal Quality: Normal    Oral/Motor/Sensory Function Overall Oral Motor/Sensory Function: Within functional limits(appears functional during intake)   Ice Chips Ice chips: Not tested   Thin Liquid Thin Liquid: Within functional limits Presentation: Self Fed;Straw    Nectar Thick Nectar Thick Liquid: Not tested   Honey Thick Honey Thick Liquid: Not tested   Puree Puree: Not tested   Solid     Solid: Within functional limits Presentation: Self [name] 02/02/2018,3:30 PM  Maxcine Ham, M.A. CCC-SLP Acute Herbalist [phone] Office [phone]

## 2018-02-03 DIAGNOSIS — J9601 Acute respiratory failure with hypoxia: Secondary | ICD-10-CM | POA: Diagnosis not present

## 2018-02-03 DIAGNOSIS — I480 Paroxysmal atrial fibrillation: Secondary | ICD-10-CM | POA: Diagnosis not present

## 2018-02-03 DIAGNOSIS — J441 Chronic obstructive pulmonary disease with (acute) exacerbation: Secondary | ICD-10-CM | POA: Diagnosis not present

## 2018-02-03 DIAGNOSIS — E1165 Type 2 diabetes mellitus with hyperglycemia: Secondary | ICD-10-CM | POA: Diagnosis not present

## 2018-02-03 LAB — CBC WITH DIFFERENTIAL/PLATELET
Abs Immature Granulocytes: 0.02 10*3/uL (ref 0.00–0.07)
Basophils Absolute: 0 10*3/uL (ref 0.0–0.1)
Basophils Relative: 1 %
EOS ABS: 0.2 10*3/uL (ref 0.0–0.5)
Eosinophils Relative: 4 %
HEMATOCRIT: 35 % — AB (ref 39.0–52.0)
Hemoglobin: 10.8 g/dL — ABNORMAL LOW (ref 13.0–17.0)
Immature Granulocytes: 0 %
LYMPHS ABS: 1.1 10*3/uL (ref 0.7–4.0)
Lymphocytes Relative: 17 %
MCH: 28.2 pg (ref 26.0–34.0)
MCHC: 30.9 g/dL (ref 30.0–36.0)
MCV: 91.4 fL (ref 80.0–100.0)
MONOS PCT: 10 %
Monocytes Absolute: 0.7 10*3/uL (ref 0.1–1.0)
Neutro Abs: 4.5 10*3/uL (ref 1.7–7.7)
Neutrophils Relative %: 68 %
Platelets: 174 10*3/uL (ref 150–400)
RBC: 3.83 MIL/uL — ABNORMAL LOW (ref 4.22–5.81)
RDW: 13.7 % (ref 11.5–15.5)
WBC: 6.6 10*3/uL (ref 4.0–10.5)
nRBC: 0 % (ref 0.0–0.2)

## 2018-02-03 LAB — COMPREHENSIVE METABOLIC PANEL
ALT: 13 U/L (ref 0–44)
AST: 16 U/L (ref 15–41)
Albumin: 3 g/dL — ABNORMAL LOW (ref 3.5–5.0)
Alkaline Phosphatase: 32 U/L — ABNORMAL LOW (ref 38–126)
Anion gap: 6 (ref 5–15)
BUN: 16 mg/dL (ref 8–23)
CALCIUM: 8.4 mg/dL — AB (ref 8.9–10.3)
CO2: 22 mmol/L (ref 22–32)
Chloride: 108 mmol/L (ref 98–111)
Creatinine, Ser: 0.94 mg/dL (ref 0.61–1.24)
GFR calc Af Amer: >60 mL/min (ref >60)
Glucose, Bld: 159 mg/dL — ABNORMAL HIGH (ref 70–99)
POTASSIUM: 4.1 mmol/L (ref 3.5–5.1)
Sodium: 136 mmol/L (ref 135–145)
TOTAL PROTEIN: 5.6 g/dL — AB (ref 6.5–8.1)
Total Bilirubin: 0.6 mg/dL (ref 0.3–1.2)

## 2018-02-03 LAB — PHOSPHORUS: Phosphorus: 2.7 mg/dL (ref 2.5–4.6)

## 2018-02-03 LAB — MAGNESIUM: MAGNESIUM: 1.6 mg/dL — AB (ref 1.7–2.4)

## 2018-02-03 LAB — GLUCOSE, CAPILLARY
GLUCOSE-CAPILLARY: 131 mg/dL — AB (ref 70–99)
Glucose-Capillary: 148 mg/dL — ABNORMAL HIGH (ref 70–99)

## 2018-02-03 MED ORDER — DOXYCYCLINE HYCLATE 100 MG PO TABS: 100.0000 mg | ORAL_TABLET | Freq: Two times a day (BID) | ORAL | 0 refills | Status: AC

## 2018-02-03 MED ORDER — LEVALBUTEROL HCL 0.63 MG/3ML IN NEBU: 0.6300 mg | INHALATION_SOLUTION | Freq: Four times a day (QID) | RESPIRATORY_TRACT | 12 refills | Status: DC | PRN

## 2018-02-03 MED ORDER — MAGNESIUM SULFATE 2 GM/50ML IV SOLN
2.0000 g | Freq: Once | INTRAVENOUS | Status: AC
  Administered 2018-02-03: 2 g via INTRAVENOUS
  Filled 2018-02-03: qty 50

## 2018-02-03 MED ORDER — GUAIFENESIN ER 600 MG PO TB12: 1200.0000 mg | ORAL_TABLET | Freq: Two times a day (BID) | ORAL | 0 refills | Status: DC

## 2018-02-03 MED ORDER — APIXABAN 5 MG PO TABS: 5.0000 mg | ORAL_TABLET | Freq: Two times a day (BID) | ORAL | 0 refills | Status: DC

## 2018-02-03 MED ORDER — LEVALBUTEROL HCL 1.25 MG/0.5ML IN NEBU: 1.2500 mg | INHALATION_SOLUTION | Freq: Two times a day (BID) | RESPIRATORY_TRACT | Status: DC

## 2018-02-03 MED ORDER — APIXABAN 5 MG PO TABS
5.0000 mg | ORAL_TABLET | Freq: Two times a day (BID) | ORAL | Status: DC
  Administered 2018-02-03: 5 mg via ORAL
  Filled 2018-02-03: qty 1

## 2018-02-03 MED ORDER — IPRATROPIUM BROMIDE 0.02 % IN SOLN: 0.5000 mg | Freq: Two times a day (BID) | RESPIRATORY_TRACT | Status: DC

## 2018-02-03 NOTE — Discharge Instructions (Signed)
Information on my medicine - ELIQUIS® (apixaban) ° °This medication education was reviewed with me or my healthcare representative as part of my discharge preparation.  The pharmacist that spoke with me during my hospital stay was:  Sagar Tengan R, RPH ° °Why was Eliquis® prescribed for you? °Eliquis® was prescribed for you to reduce the risk of a blood clot forming that can cause a stroke if you have a medical condition called atrial fibrillation (a type of irregular heartbeat). ° °What do You need to know about Eliquis® ? °Take your Eliquis® TWICE DAILY - one tablet in the morning and one tablet in the evening with or without food. If you have difficulty swallowing the tablet whole please discuss with your pharmacist how to take the medication safely. ° °Take Eliquis® exactly as prescribed by your doctor and DO NOT stop taking Eliquis® without talking to the doctor who prescribed the medication.  Stopping may increase your risk of developing a stroke.  Refill your prescription before you run out. ° °After discharge, you should have regular check-up appointments with your healthcare provider that is prescribing your Eliquis®.  In the future your dose may need to be changed if your kidney function or weight changes by a significant amount or as you get older. ° °What do you do if you miss a dose? °If you miss a dose, take it as soon as you remember on the same day and resume taking twice daily.  Do not take more than one dose of ELIQUIS at the same time to make up a missed dose. ° °Important Safety Information °A possible side effect of Eliquis® is bleeding. You should call your healthcare provider right away if you experience any of the following: °Bleeding from an injury or your nose that does not stop. °Unusual colored urine (red or dark brown) or unusual colored stools (red or black). °Unusual bruising for unknown reasons. °A serious fall or if you hit your head (even if there is no bleeding). ° °Some  medicines may interact with Eliquis® and might increase your risk of bleeding or clotting while on Eliquis®. To help avoid this, consult your healthcare provider or pharmacist prior to using any new prescription or non-prescription medications, including herbals, vitamins, non-steroidal anti-inflammatory drugs (NSAIDs) and supplements. ° °This website has more information on Eliquis® (apixaban): http://www.eliquis.com/eliquis/home  °

## 2018-02-03 NOTE — Progress Notes (Signed)
Patient had 5 beat run of VT. Per telemetry patient converted to NSR at 8pm last night.  MD notified via text page.

## 2018-02-03 NOTE — Discharge Summary (Addendum)
Physician Discharge Summary  [name]:[id number] DOB: 10/17/1927 DOA: 02/01/2018  PCP: [facility]  Admit date: 02/01/2018 Discharge date: 02/03/2018  Admitted From: Home Disposition: Home  Recommendations for Outpatient Follow-up:  1. Follow up with PCP in 1-2 weeks 2. Follow up with Pulmonary as an outpatient 3. Follow up with Cardiology as an outpatient for continued following of proximal atrial fibrillation as well as nonobstructive coronary artery disease.  I stopped the patient's aspirin will defer to cardiology to reinitiate aspirin as he is started on apixaban for his atrial fibrillation 4. Please obtain CMP/CBC, Mag, Phos in one week 5. Repeat thyroid function studies within 4 to 6 weeks 6. Repeat chest x-ray in 3 to 6 weeks 7. Please follow up on the following pending results:  Home Health: No Equipment/Devices: DME Nebulizer     Discharge Condition: Stable CODE STATUS: FULL CODE  Diet recommendation: Heart Healthy Diet  Brief/Interim Summary: The patient is an 84 year old elderly Caucasian male with past medical history significant for COPD, diabetes mellitus, history of CVA, atrial fibrillation, hypertension, dementia and other comorbidities who was brought to the hospital secondary to having a fever of 102.  Patient also complained of being short of breath.  Upon evaluation the ED he was found to be wheezing and was given a nebulizer treatment.  There was a concern for a possible pneumonia because there was some localizing sign on exam of the right side for the patient was given an antibiotic for.  Chest x-ray was unremarkable and EKG showed A. Fib.  Patient was admitted for acute respiratory failure with hypoxia in setting of possible COPD exacerbation as well as pneumonia.  Continue IV doxycycline and troponin and d-dimer checked.    D-dimer slightly elevated so CTA of the chest was obtained which showed no evidence of any pulmonary embolism but there  is bilateral lower lobe bronchial wall thickening and patchy peribronchial opacities there is consistent with bronchitis and probable bronchopneumonia.  No other acute abnormality noted and there is also moderate centrilobular emphysema along with aortic atherosclerosis and coronary artery calcifications.   Patient was placed on Xopenex and Atrovent which will continue now and he is on a 3 times daily and continued to improve on breathing treatments.  I had also started guaifenesin 1200 mg p.o. twice daily, flutter valve and incentive spirometry.  He was weaned off of oxygen yesterday when I saw him.  He improved and was continued to be treated with doxycycline and nebs.  Home ambulatory screen done showed patient did desaturated but he came to follow the patient does have home oxygen and wears it intermittently.  He is placed back on 2 L and walked and did not desaturate.  He will be discharged home will continue treatment.  He will also need to see cardiology in outpatient setting given his proximal atrial fibrillation nonobstructive coronary artery disease.  I have stopped his current aspirin dose and placed him on apixaban given his elevated risk for stroke with his A. fib.  Will need to follow-up with cardiology within 1 to 2 weeks as well as pulmonology in outpatient setting.   Discharge Diagnoses:  Principal Problem:   Acute respiratory failure with hypoxia (HCC) Active Problems:   COPD exacerbation (HCC)   Diabetes mellitus type II, uncontrolled (HCC)   Hypothyroidism   HTN (hypertension)   Atrial fibrillation (HCC)   History of CVA (cerebrovascular accident)  Acute on chronic respiratory failure with hypoxia with associated Fever in the setting  of Bronchitis and Bronchopneumonia -In the setting of COPD and bronchopneumonia -Will continue with nebulizer treatments and doxy for now.  -Checked influenza PCR and negative. -Dimer was slightly elevated which prompted a CTA of the chest and  that showed evidence of PE but did show bilateral lower lobe bronchial wall thickening and patchy peribronchovascular opacities which are consistent with bronchitis and bronchopneumonia -Patient was treated and improved and will be discharged on home nebulizer -Home amatory screen done and patient did desaturate however he does wear chronic oxygen intermittently and will be discharged back on 2 L as he improved on 2 L -Will be discharged home on doxycycline and nebulized treatments. -Was not wheezing so discharged home on home prednisone taper -Continue home inhalers with Symbicort and Combivent  AKI -Given IV fluid gentle hydration -Improved as BUN/creatinine went from 20/1.27 and is now 16/0.94 -Avoid nephrotoxic medications possible -Continue monitoring trend renal function outpatient setting  Hypertension  -Continue home hydrochlorothiazide now that renal function is improved and amlodipine at discharge  Diabetes Mellitus Type 2  -She was placed on high scale insulin while hospitalized -Resume home metformin 500 mg p.o. twice daily at discharge  Paroxysmal Atrial Fib -presently rate controlled on Admission and converted to sinus rhythm early in the morning -CHA2DS2-VASc was 5 -Patient was supposed to be sent home on anticoagulation back from cardiology note however never was documented and recommended it.  I started the patient on apixaban now and he will be discharged on anticoagulation at this time -He will need to follow-up with cardiology in outpatient setting -Continue with home metoprolol 25 mg p.o. twice daily  Hypothyroidism  -TSH was slightly elevated at 5.671 -Continue with levothyroxine 25 mcg p.o. daily before breakfast and may need to increase dose -Repeat thyroid function studies in 4 to 6 weeks  Normocytic normochromic anemia -Hemoglobin/hematocrit went from 11.4/37.1 and is now 10.8/35.0 -Anemia panel outpatient setting -Continue monitor for signs and symptoms  of bleeding -Repeat CBC in the outpatient setting  Hx of nonobstructive coronary artery disease -Was taking nitroglycerin as well as aspirin 81 mg p.o. daily along with lisinopril and metoprolol -Aspirin has been stopped now that we have placed the patient on apixaban however will need to defer to cardiology in outpatient setting to resume aspirin along with apixaban given the nonobstructive -Has had a cardiac catheterization 2015 and a nuclear stress test last hospitalization -Follow-up with cardiology Dr. Diona Browner in outpatient setting  GERD -Continue with home omeprazole and ranitidine at discharge   Discharge Instructions  Discharge Instructions    Call MD for:  difficulty breathing, headache or visual disturbances   Complete by:  As directed    Call MD for:  extreme fatigue   Complete by:  As directed    Call MD for:  hives   Complete by:  As directed    Call MD for:  persistant dizziness or light-headedness   Complete by:  As directed    Call MD for:  persistant nausea and vomiting   Complete by:  As directed    Call MD for:  redness, tenderness, or signs of infection (pain, swelling, redness, odor or green/yellow discharge around incision site)   Complete by:  As directed    Call MD for:  severe uncontrolled pain   Complete by:  As directed    Call MD for:  temperature >100.4   Complete by:  As directed    Diet - low sodium heart healthy   Complete by:  As  directed    Discharge instructions   Complete by:  As directed    You were cared for by a hospitalist during your hospital stay. If you have any questions about your discharge medications or the care you received while you were in the hospital after you are discharged, you can call the unit and ask to speak with the hospitalist on call if the hospitalist that took care of you is not available. Once you are discharged, your primary care physician will handle any further medical issues. Please note that NO REFILLS for any  discharge medications will be authorized once you are discharged, as it is imperative that you return to your primary care physician (or establish a relationship with a primary care physician if you do not have one) for your aftercare needs so that they can reassess your need for medications and monitor your lab values.  Follow up with PCP and Cardiology as an outpatient. Take all medications as prescribed. If symptoms change or worsen please return to the ED for evaluation   Increase activity slowly   Complete by:  As directed      Allergies as of 02/03/2018   No Known Allergies     Medication List    STOP taking these medications   aspirin 81 MG EC tablet   predniSONE 5 MG tablet Commonly known as:  DELTASONE     TAKE these medications   albuterol 108 (90 Base) MCG/ACT inhaler Commonly known as:  PROVENTIL HFA;VENTOLIN HFA Inhale 2 puffs into the lungs every 4 (four) hours as needed for wheezing or shortness of breath.   amLODipine 5 MG tablet Commonly known as:  NORVASC Take 5 mg by mouth daily.   apixaban 5 MG Tabs tablet Commonly known as:  ELIQUIS Take 1 tablet (5 mg total) by mouth 2 (two) times daily.   brimonidine 0.15 % ophthalmic solution Commonly known as:  ALPHAGAN Place 1 drop into both eyes 2 (two) times daily.   budesonide-formoterol 80-4.5 MCG/ACT inhaler Commonly known as:  SYMBICORT Inhale 2 puffs into the lungs 2 (two) times daily.   Carboxymethylcellulose Sodium 0.25 % Soln Apply 2 drops to eye 4 (four) times daily.   COMBIVENT RESPIMAT 20-100 MCG/ACT Aers respimat Generic drug:  Ipratropium-Albuterol Inhale 1 puff into the lungs every 6 (six) hours as needed for wheezing.   doxycycline 100 MG tablet Commonly known as:  VIBRA-TABS Take 1 tablet (100 mg total) by mouth 2 (two) times daily for 5 days.   eucerin cream Apply 1 application topically See admin instructions. Apply small amount to affected area twice a day.   finasteride 5 MG  tablet Commonly known as:  PROSCAR Take 5 mg by mouth daily.   guaiFENesin 600 MG 12 hr tablet Commonly known as:  MUCINEX Take 2 tablets (1,200 mg total) by mouth 2 (two) times daily.   hydrochlorothiazide 25 MG tablet Commonly known as:  HYDRODIURIL Take 25 mg by mouth daily.   levalbuterol 0.63 MG/3ML nebulizer solution Commonly known as:  XOPENEX Take 3 mLs (0.63 mg total) by nebulization every 6 (six) hours as needed for wheezing or shortness of breath.   levothyroxine 25 MCG tablet Commonly known as:  SYNTHROID, LEVOTHROID Take 25 mcg by mouth daily before breakfast.   lisinopril 40 MG tablet Commonly known as:  PRINIVIL,ZESTRIL Take 40 mg by mouth daily.   metFORMIN 500 MG tablet Commonly known as:  GLUCOPHAGE Take 500 mg by mouth 2 (two) times daily with a meal.  metoprolol tartrate 50 MG tablet Commonly known as:  LOPRESSOR Take 25 mg by mouth 2 (two) times daily.   multivitamin with minerals tablet Take 1 tablet by mouth daily.   nitroGLYCERIN 0.4 MG SL tablet Commonly known as:  NITROSTAT Place 0.4 mg under the tongue every 5 (five) minutes as needed for chest pain. Dissolve one tablet under the tongue every 5 minutes as needed for chest pain. Repeat every 5 minutes if needed for a total of 3 tablets in 15 minutes. If no relief, call 911.   omeprazole 20 MG capsule Commonly known as:  PRILOSEC Take 20 mg by mouth daily. Take one capsule by mouth in the morning for acid. Take 30-60 minutes before meal for GERD and Barrett's.   pravastatin 40 MG tablet Commonly known as:  PRAVACHOL Take 40 mg by mouth at bedtime.   ranitidine 300 MG tablet Commonly known as:  ZANTAC Take 300 mg by mouth at bedtime. Take one tablet by mouth at bedtime in addition to omeprazole for heartburn/chest pain.            Durable Medical Equipment  (From admission, onward)         Start     Ordered   02/03/18 1526  For home use only DME Nebulizer/meds  Once    Question:   Patient needs a nebulizer to treat with the following condition  Answer:  SOB (shortness of breath) on exertion   02/03/18 1525         Follow-up Information    [facility] Call.   Why:  Follow up within 1 week Contact information: [address] [facility] [place] [address] [phone]        [facility]. Call.   Why:  Follow up for further evaluation of Atrial Fibrillation within 1 week Contact information: [address] [place] [address] [phone]       [facility] - Dme Follow up.   Why:  pick up nebulizer machine from retail store Contact information: [address] [place] [address] [phone]          No Known Allergies  Consultations:  None  Procedures/Studies: Dg Chest 2 View  Result Date: 02/01/2018 CLINICAL DATA:  Cough and fever with congestion for several days. History of COPD. EXAM: CHEST - 2 VIEW COMPARISON:  Chest radiograph June 13, 2017 FINDINGS: Cardiac silhouette is mildly enlarged and unchanged. Mediastinal silhouette is not suspicious. Diffuse interstitial prominence and bibasilar atelectasis/scarring stable from prior examination. No pleural effusion or focal consolidation. No pneumothorax. Increased lung volumes with flattened hemidiaphragms. Osteopenia. IMPRESSION: 1. No acute cardiopulmonary process. 2. COPD and bibasilar atelectasis/scarring. Electronically Signed   By: Awilda Metro M.D.   On: 02/01/2018 22:09   Ct Angio Chest Pe W Or Wo Contrast  Result Date: 02/02/2018 CLINICAL DATA:  Patient presented after fevers. Reportedly fevers up to 102 today. Has had a little bit of a cough. Does have history of COPD and is oxygen at home as needed. No dysuria. Cough is been worse over the last week. Has had some more confusion with it. Somewhat difficult to get history from due to previous stroke. EXAM: CT  ANGIOGRAPHY CHEST WITH CONTRAST TECHNIQUE: Multidetector CT imaging of the chest was performed using the standard protocol during bolus administration of intravenous contrast. Multiplanar CT image reconstructions and MIPs were obtained to evaluate the vascular anatomy. CONTRAST:  ISOVUE-370 IOPAMIDOL (ISOVUE-370) INJECTION 76%  COMPARISON:  Chest radiograph, 02/01/2018. FINDINGS: Cardiovascular: There is satisfactory opacification of the pulmonary arteries to the segmental level. There is no evidence of a pulmonary embolism. Heart is top-normal in size. No pericardial effusion. Three-vessel coronary artery calcifications. Great vessels normal caliber. There is aortic atherosclerosis. No dissection or aneurysm. Mediastinum/Nodes: No neck base, axillary, mediastinal or hilar masses or enlarged lymph nodes. Trachea is widely patent. Esophagus unremarkable. Small hiatal hernia. Lungs/Pleura: Moderate centrilobular emphysema. There is bronchial wall thickening with lower lobe patchy dependent and peribronchovascular opacity. Remainder of the lungs is clear. No mass or suspicious nodule. Small partly calcified pleural plaques are noted along the anterior mid to upper hemithorax. No pleural effusion.  No pneumothorax. Upper Abdomen: No acute abnormality. Musculoskeletal: No fracture or acute finding. No osteoblastic or osteolytic lesions. Review of the MIP images confirms the above findings. IMPRESSION: 1. No evidence of a pulmonary embolism. 2. Bilateral lower lobe bronchial wall thickening with patchy peribronchovascular opacities. Eyes consistent with bronchitis and probable bronchopneumonia. 3. No other acute abnormality. 4. Moderate centrilobular emphysema. 5. Aortic atherosclerosis.  Coronary artery calcifications. Aortic Atherosclerosis (ICD10-I70.0) and Emphysema (ICD10-J43.9). Electronically Signed   By: Amie Portland M.D.   On: 02/02/2018 11:03     Subjective: Seen and examined bedside was doing well.   Denying chest pain, lightheadedness or dizziness.  Walked with the nurse and desaturated but was placed on 2 L and then improved.  Has home oxygen which she wears intermittently and placed on 2 L and he improved significantly.  He will need to be seen by a pulmonologist as well as cardiologist in outpatient setting within 1 to 2 weeks.  No other concerns or complaints at this time and ambulated fine and was seen walking well without assistance so we will not call physical therapy at this time.  Patient improved and wanted to go home.  Discharge Exam: Vitals:   02/03/18 1358 02/03/18 1457  BP: (!) 143/59   Pulse: 79   Resp: (!) 24   Temp: 98.9 F (37.2 C)   SpO2: 94% 94%   Vitals:   02/03/18 0508 02/03/18 1038 02/03/18 1358 02/03/18 1457  BP: 132/65  (!) 143/59   Pulse: 93  79   Resp: 17  (!) 24   Temp: 98.7 F (37.1 C)  98.9 F (37.2 C)   TempSrc: Oral  Oral   SpO2: 91% 92% 94% 94%  Weight: 70.5 kg     Height:       General: Pt is alert, awake, not in acute distress Cardiovascular: RRR, S1/S2 +, no rubs, no gallops Respiratory: Diminished bilaterally, no wheezing, no rhonchi Abdominal: Soft, NT, ND, bowel sounds + Extremities: no edema, no cyanosis  The results of significant diagnostics from this hospitalization (including imaging, microbiology, ancillary and laboratory) are listed below for reference.    Microbiology: No results found for this or any previous visit (from the past 240 hour(s)).   Labs: BNP (last 3 results) No results for input(s): BNP in the last 8760 hours. Basic Metabolic Panel: Recent Labs  Lab 02/01/18 2137 02/02/18 0513 02/03/18 0540  NA 138 140 136  K 3.6 3.5 4.1  CL 102 106 108  CO2 23 25 22   GLUCOSE 192* 225* 159*  BUN 21 20 16   CREATININE 1.36* 1.27* 0.94  CALCIUM 9.1 8.9 8.4*  MG  --  2.0 1.6*  PHOS  --   --  2.7   Liver Function Tests: Recent Labs  Lab 02/01/18 2137 02/02/18 0513  02/03/18 0540  AST 22 18 16   ALT 16 15 13    ALKPHOS 40 34* 32*  BILITOT 0.7 0.7 0.6  PROT 6.4* 5.7* 5.6*  ALBUMIN 3.6 3.5 3.0*   No results for input(s): LIPASE, AMYLASE in the last 168 hours. No results for input(s): AMMONIA in the last 168 hours. CBC: Recent Labs  Lab 02/01/18 2137 02/02/18 0513 02/03/18 0540  WBC 9.2 7.5 6.6  NEUTROABS 6.8 5.3 4.5  HGB 12.2* 11.4* 10.8*  HCT 38.2* 37.1* 35.0*  MCV 89.0 93.5 91.4  PLT 183 164 174   Cardiac Enzymes: Recent Labs  Lab 02/02/18 0513 02/02/18 1200 02/02/18 1830  TROPONINI <0.03 <0.03 <0.03   BNP: Invalid input(s): POCBNP CBG: Recent Labs  Lab 02/02/18 1204 02/02/18 1659 02/02/18 2030 02/03/18 0747 02/03/18 1141  GLUCAP 131* 221* 166* 148* 131*   D-Dimer Recent Labs    02/02/18 0513  DDIMER 0.57*   Hgb A1c No results for input(s): HGBA1C in the last 72 hours. Lipid Profile No results for input(s): CHOL, HDL, LDLCALC, TRIG, CHOLHDL, LDLDIRECT in the last 72 hours. Thyroid function studies Recent Labs    02/02/18 0513  TSH 5.671*   Anemia work up No results for input(s): VITAMINB12, FOLATE, FERRITIN, TIBC, IRON, RETICCTPCT in the last 72 hours. Urinalysis    Component Value Date/Time   COLORURINE YELLOW 02/02/2018 0013   APPEARANCEUR CLEAR 02/02/2018 0013   LABSPEC 1.015 02/02/2018 0013   PHURINE 6.5 02/02/2018 0013   GLUCOSEU NEGATIVE 02/02/2018 0013   HGBUR LARGE (A) 02/02/2018 0013   BILIRUBINUR NEGATIVE 02/02/2018 0013   KETONESUR NEGATIVE 02/02/2018 0013   PROTEINUR 30 (A) 02/02/2018 0013   UROBILINOGEN 1.0 10/28/2014 1628   NITRITE NEGATIVE 02/02/2018 0013   LEUKOCYTESUR NEGATIVE 02/02/2018 0013   Sepsis Labs Invalid input(s): PROCALCITONIN,  WBC,  LACTICIDVEN Microbiology No results found for this or any previous visit (from the past 240 hour(s)).  Time coordinating discharge: 35 minutes  SIGNED:  Merlene Laughter, DO [facility] 02/03/2018, 8:08 PM Pager is on AMION  If 7PM-7AM, please contact  night-coverage [email or web address] Password [email or web address]

## 2018-02-03 NOTE — Care Management Note (Signed)
Case Management Note  Patient Details  Name: [name] MRN: [id number] Date of Birth: 19-Apr-1927  Subjective/Objective:       PNA, COPD exac             Action/Plan: NCM spoke to pt and son at bedside. Provided pt with Rx for neb machine to pick up at [facility] retail store in am or [place]. Contacted his pharmacy for Eliquis. Pt has Eliquis 30 day free trial card. Has oxygen ([facility]) and RW.   Expected Discharge Date:  02/03/18               Expected Discharge Plan:  Home/Self Care  In-House Referral:  NA  Discharge planning Services  CM Consult  Post Acute Care Choice:  NA Choice offered to:  NA  DME Arranged:  Nebulizer machine DME Agency:  [facility]  HH Arranged:  NA HH Agency:  NA  Status of Service:  Completed, signed off  If discussed at Long Length of Stay Meetings, dates discussed:    Additional Comments:  Elliot Cousin, RN 02/03/2018, 5:13 PM

## 2018-02-03 NOTE — Progress Notes (Signed)
PT continues to utilize Flutter device- productive cough at this time. 

## 2018-02-03 NOTE — Progress Notes (Signed)
Discharge instructions and medications discussed with patient and son.  AVS and prescription given to patient.  All questions answered.

## 2018-02-03 NOTE — Progress Notes (Signed)
ANTICOAGULATION CONSULT NOTE - Initial Consult  Pharmacy Consult for Apixaban Indication: atrial fibrillation  No Known Allergies  Patient Measurements: Height: 5\' 8"  (172.7 cm) Weight: 155 lb 6.8 oz (70.5 kg) IBW/kg (Calculated) : 68.4  Vital Signs: Temp: 98.7 F (37.1 C) (11/10 0508) Temp Source: Oral (11/10 0508) BP: 132/65 (11/10 0508) Pulse Rate: 93 (11/10 0508)  Labs: Recent Labs    02/01/18 2137 02/02/18 0513 02/02/18 1200 02/02/18 1830 02/03/18 0540  HGB 12.2* 11.4*  --   --  10.8*  HCT 38.2* 37.1*  --   --  35.0*  PLT 183 164  --   --  174  CREATININE 1.36* 1.27*  --   --  0.94  TROPONINI  --  <0.03 <0.03 <0.03  --     Estimated Creatinine Clearance: 52.6 mL/min (by C-G formula based on SCr of 0.94 mg/dL).   Medical History: Past Medical History:  Diagnosis Date  . Atrial fibrillation (HCC)   . COPD (chronic obstructive pulmonary disease) (HCC)   . CVA (cerebral infarction)   . Diabetes mellitus without complication (HCC)   . Essential hypertension, benign   . Hard of hearing     Medications:  Scheduled:  . aspirin EC  81 mg Oral Daily  . brimonidine  1 drop Both Eyes BID  . finasteride  5 mg Oral Daily  . guaiFENesin  1,200 mg Oral BID  . insulin aspart  0-9 Units Subcutaneous TID WC  . ipratropium  0.5 mg Nebulization TID  . levalbuterol  0.63 mg Nebulization TID  . levothyroxine  25 mcg Oral Q0600  . lisinopril  40 mg Oral Daily  . mometasone-formoterol  2 puff Inhalation BID  . multivitamin with minerals  1 tablet Oral Daily  . potassium chloride  40 mEq Oral BID  . pravastatin  40 mg Oral QHS  . terazosin  10 mg Oral QHS   Infusions:  . doxycycline (VIBRAMYCIN) IV Stopped (02/03/18 0722)  . magnesium sulfate 1 - 4 g bolus IVPB 2 g (02/03/18 0821)   PRN: acetaminophen **OR** acetaminophen, hydrALAZINE, levalbuterol  Assessment: 84 yo male admitted with fever and shortness of breath.  EKG showed atrial fibrillation.  Pharmacy is  consulted to begin apixaban.   Age > 80  Weight > 60kg  SCr < 1.5  No drug interactions; will stop aspirin  Goal of Therapy:  Therapeutic anticoagulation, prevention of VTE/stroke   Plan:  Apixaban 5mg  PO bid Follow renal function, CBC, signs/symptoms of bleeding Provide education and coupon voucher prior to discharge  Loralee Pacas, PharmD, BCPS Pager: [phone] 02/03/2018,8:31 AM

## 2018-02-03 NOTE — Progress Notes (Signed)
SATURATION QUALIFICATIONS: (This note is used to comply with regulatory documentation for home oxygen)  Patient Saturations on Room Air at Rest = 95%  Patient Saturations on Room Air while Ambulating = 84%  Patient Saturations on 2 Liters of oxygen while Ambulating = 94%  Please briefly explain why patient needs home oxygen: 

## 2018-02-15 ENCOUNTER — Emergency Department (HOSPITAL_BASED_OUTPATIENT_CLINIC_OR_DEPARTMENT_OTHER): Payer: Medicare Other

## 2018-02-15 ENCOUNTER — Encounter (HOSPITAL_BASED_OUTPATIENT_CLINIC_OR_DEPARTMENT_OTHER): Payer: Self-pay

## 2018-02-15 ENCOUNTER — Other Ambulatory Visit: Payer: Self-pay

## 2018-02-15 ENCOUNTER — Emergency Department (HOSPITAL_BASED_OUTPATIENT_CLINIC_OR_DEPARTMENT_OTHER)
Admission: EM | Admit: 2018-02-15 | Discharge: 2018-02-15 | Disposition: A | Discharge status: Home / Self Care | Payer: Medicare Other | Attending: Emergency Medicine | Admitting: Emergency Medicine

## 2018-02-15 DIAGNOSIS — K5900 Constipation, unspecified: Secondary | ICD-10-CM

## 2018-02-15 DIAGNOSIS — Z87891 Personal history of nicotine dependence: Secondary | ICD-10-CM | POA: Diagnosis not present

## 2018-02-15 DIAGNOSIS — J449 Chronic obstructive pulmonary disease, unspecified: Secondary | ICD-10-CM | POA: Insufficient documentation

## 2018-02-15 DIAGNOSIS — E039 Hypothyroidism, unspecified: Secondary | ICD-10-CM | POA: Diagnosis not present

## 2018-02-15 DIAGNOSIS — E119 Type 2 diabetes mellitus without complications: Secondary | ICD-10-CM | POA: Insufficient documentation

## 2018-02-15 DIAGNOSIS — I2510 Atherosclerotic heart disease of native coronary artery without angina pectoris: Secondary | ICD-10-CM | POA: Insufficient documentation

## 2018-02-15 DIAGNOSIS — I10 Essential (primary) hypertension: Secondary | ICD-10-CM | POA: Insufficient documentation

## 2018-02-15 MED ORDER — SENNOSIDES-DOCUSATE SODIUM 8.6-50 MG PO TABS: 1.0000 | ORAL_TABLET | Freq: Every evening | ORAL | 0 refills | Status: DC | PRN

## 2018-02-15 NOTE — ED Triage Notes (Signed)
Pt with constipation x 1 week-minimal relief with OTC meds-daughter with pt-pt NAD-steady gait

## 2018-02-15 NOTE — Discharge Instructions (Signed)
You were seen in the emergency department today for constipation.  We recommend that you use one or more of the following over-the-counter medications in the order described: °  °1)  Colace (or Dulcolax) 100 mg:  This is a stool softener, and you may take it once or twice a day as needed. °2)  Senna tablets:  This is a bowel stimulant that will help "push" out your stool. It is the next step to add after you have tried a stool softener. °3)  Miralax (powder):  This medication works by drawing additional fluid into your intestines and helps to flush out your stool.  Mix the powder with water or juice according to label instructions.  It may help if the Colace and Senna are not sufficient, but you must be sure to use the recommended amount of water or juice when you mix up the powder. °Remember that narcotic pain medications are constipating, so avoid them or minimize their use.  Drink plenty of fluids. ° °Please return to the Emergency Department immediately if you develop new or worsening symptoms that concern you, such as (but not limited to) fever > 101 degrees, severe abdominal pain, or persistent vomiting. ° ° °Constipation °Constipation is when a person has fewer than three bowel movements a week, has difficulty having a bowel movement, or has stools that are dry, hard, or larger than normal. As people grow older, constipation is more common. If you try to fix constipation with medicines that make you have a bowel movement (laxatives), the problem may get worse. [name]-term laxative use may cause the muscles of the colon to become weak. A low-fiber diet, not taking in enough fluids, and taking certain medicines may make constipation worse.  °CAUSES  °Certain medicines, such as antidepressants, pain medicine, iron supplements, antacids, and water pills.   °Certain diseases, such as diabetes, irritable bowel syndrome (IBS), thyroid disease, or depression.   °Not drinking enough water.   °Not eating enough  fiber-rich foods.   °Stress or travel.   °Lack of physical activity or exercise.   °Ignoring the urge to have a bowel movement.   °Using laxatives too much.   °SIGNS AND SYMPTOMS  °Having fewer than three bowel movements a week.   °Straining to have a bowel movement.   °Having stools that are hard, dry, or larger than normal.   °Feeling full or bloated.   °Pain in the lower abdomen.   °Not feeling relief after having a bowel movement.   °DIAGNOSIS  °Your health care provider will take a medical history and perform a physical exam. Further testing may be done for severe constipation. Some tests may include: °A barium enema X-ray to examine your rectum, colon, and, sometimes, your small intestine.   °A sigmoidoscopy to examine your lower colon.   °A colonoscopy to examine your entire colon. °TREATMENT  °Treatment will depend on the severity of your constipation and what is causing it. Some dietary treatments include drinking more fluids and eating more fiber-rich foods. Lifestyle treatments may include regular exercise. If these diet and lifestyle recommendations do not help, your health care provider may recommend taking over-the-counter laxative medicines to help you have bowel movements. Prescription medicines may be prescribed if over-the-counter medicines do not work.  °HOME CARE INSTRUCTIONS  °Eat foods that have a lot of fiber, such as fruits, vegetables, whole grains, and beans. °Limit foods high in fat and processed sugars, such as french fries, hamburgers, cookies, candies, and soda.   °A   fiber supplement may be added to your diet if you cannot get enough fiber from foods.   °Drink enough fluids to keep your urine clear or pale yellow.   °Exercise regularly or as directed by your health care provider.   °Go to the restroom when you have the urge to go. Do not hold it.   °Only take over-the-counter or prescription medicines as directed by your health care provider. Do not take other medicines for constipation  without talking to your health care provider first.   °SEEK IMMEDIATE MEDICAL CARE IF:  °You have bright red blood in your stool.   °Your constipation lasts for more than 4 days or gets worse.   °You have abdominal or rectal pain.   °You have thin, pencil-like stools.   °You have unexplained weight loss. °MAKE SURE YOU:  °Understand these instructions. °Will watch your condition. °Will get help right away if you are not doing well or get worse. °Document Released: 12/10/2003 Document Revised: 03/18/2013 Document Reviewed: 12/23/2012 °ExitCare® Patient Information ©2015 [employer]. This information is not intended to replace advice given to you by your health care provider. Make sure you discuss any questions you have with your health care provider. ° ° ° °

## 2018-02-15 NOTE — ED Notes (Signed)
ED Provider at bedside. 

## 2018-02-15 NOTE — ED Provider Notes (Signed)
Emergency Department Provider Note   I have reviewed the triage vital signs and the nursing notes.   HISTORY  Chief Complaint Constipation   HPI [name] is a 84 y.o. male with PMH of A-fib on Eliquis, COPD, CVA, DM, and HTN the emergency department for evaluation of constipation.  Patient has had minimal bowel movements for the past 4 to 5 days.  He reports some abdominal discomfort with trying to have a bowel movement but denies rectal pain or bleeding at rest.  She has been taking MiraLAX and stool softener at home.  No suppositories or enemas.  No fevers or chills.  Denies any chest pain or shortness of breath.  Past Medical History:  Diagnosis Date  . Atrial fibrillation (HCC)   . COPD (chronic obstructive pulmonary disease) (HCC)   . CVA (cerebral infarction)   . Diabetes mellitus without complication (HCC)   . Essential hypertension, benign   . Hard of hearing     Patient Active Problem List   Diagnosis Date Noted  . Acute respiratory failure with hypoxia (HCC) 02/02/2018  . Atrial fibrillation (HCC) 05/20/2015  . Acute on chronic respiratory failure with hypoxia (HCC) 05/20/2015  . Chest pain syndrome 05/20/2015  . Acute bronchitis 05/20/2015  . AKI (acute kidney injury) (HCC) 05/20/2015  . RBBB 05/20/2015  . Prolonged Q-T interval on ECG 05/20/2015  . CAD (coronary artery disease) 05/20/2015  . History of CVA (cerebrovascular accident) 05/20/2015  . Atrial fibrillation with RVR (HCC)   . CAP (community acquired pneumonia)   . Elevated troponin   . Viral bronchitis 10/29/2014  . Viral gastroenteritis 10/29/2014  . Bronchiectasis with acute exacerbation (HCC)   . Fever 10/28/2014  . Unstable angina (HCC) 03/27/2013  . COPD exacerbation (HCC) 03/25/2013  . Diabetes mellitus type II, uncontrolled (HCC) 03/25/2013  . Hypothyroidism 03/25/2013  . BPH (benign prostatic hyperplasia) 03/25/2013  . HTN (hypertension) 03/25/2013    Past Surgical History:    Procedure Laterality Date  . HERNIA REPAIR    . LEFT HEART CATHETERIZATION WITH CORONARY ANGIOGRAM N/A 03/28/2013   Procedure: LEFT HEART CATHETERIZATION WITH CORONARY ANGIOGRAM;  Surgeon: Peter M SwazilandJordan, MD;  [facility];  Service: Cardiovascular;  Laterality: N/A;   Allergies Patient has no known allergies.  Family History  Family history unknown: Yes    Social History Social History   Tobacco Use  . Smoking status: Former Smoker    Types: Cigarettes  . Smokeless tobacco: Former Engineer, waterUser  Substance Use Topics  . Alcohol use: No  . Drug use: No    Review of Systems  Constitutional: No fever/chills Eyes: No visual changes. ENT: No sore throat. Cardiovascular: Denies chest pain. Respiratory: Denies shortness of breath. Gastrointestinal: No abdominal pain.  No nausea, no vomiting.  No diarrhea.  Positive constipation. Genitourinary: Negative for dysuria. Musculoskeletal: Negative for back pain. Skin: Negative for rash. Neurological: Negative for headaches, focal weakness or numbness.  10-point ROS otherwise negative.  ____________________________________________   PHYSICAL EXAM:  VITAL SIGNS: ED Triage Vitals  Enc Vitals Group     BP 02/15/18 1224 (!) 168/76     Pulse Rate 02/15/18 1224 76     Resp 02/15/18 1224 16     Temp 02/15/18 1224 98.7 F (37.1 C)     Temp Source 02/15/18 1224 Oral     SpO2 02/15/18 1224 94 %     Weight 02/15/18 1223 137 lb (62.1 kg)     Height 02/15/18 1223 5'  2" (1.575 m)     Pain Score 02/15/18 1221 3   Constitutional: Alert and oriented. Well appearing and in no acute distress. Hard of hearing.  Eyes: Conjunctivae are normal.  Head: Atraumatic. Nose: No congestion/rhinnorhea. Mouth/Throat: Mucous membranes are moist.  Neck: No stridor.   Cardiovascular: Normal rate, regular rhythm. Good peripheral circulation. Grossly normal heart sounds.   Respiratory: Normal respiratory effort.  No retractions. Lungs  CTAB. Gastrointestinal: Soft and nontender. No distention. Rectal exam performed with nurse chaperone. Normal external exam. No hard stool appreciated or fecal impaction.  Musculoskeletal: No lower extremity tenderness nor edema. No gross deformities of extremities. Neurologic:  Normal speech and language. Skin:  Skin is warm, dry and intact. No rash noted.  ____________________________________________  RADIOLOGY  Dg Abdomen Acute W/chest  Result Date: 02/15/2018 CLINICAL DATA:  Constipation. EXAM: DG ABDOMEN ACUTE W/ 1V CHEST COMPARISON:  Chest x-ray 02/01/2018, 02/26/2017. CT 02/02/2018. CT 10/28/2014. FINDINGS: Mediastinum hilar structures normal. Heart size normal. Mild right mid lung infiltrate. Bibasilar atelectasis/scarring again noted. Tiny bilateral pleural effusions cannot be excluded. No pneumothorax. Calcified densities again noted within the liver. Similar finding noted on prior CT of 10/28/2014. Stool noted throughout the colon. Constipation could present this fashion. No bowel distention. No free air. IMPRESSION: 1. Chest x-ray reveals mild right mid lung infiltrate. Bibasilar atelectasis/scarring again noted. 2. Stool noted throughout the colon. Constipation could present this fashion. No acute intra-abdominal abnormality identified. Electronically Signed   By: Maisie Fus  Register   On: 02/15/2018 13:09    ____________________________________________   PROCEDURES  Procedure(s) performed:   Procedures  None ____________________________________________   INITIAL IMPRESSION / ASSESSMENT AND PLAN / ED COURSE  Pertinent labs & imaging results that were available during my care of the patient were reviewed by me and considered in my medical decision making (see chart for details).  Patient with diffusely soft and nontender abdomen on exam.  Rectal exam does not demonstrate any cool impaction.  No rectal bleeding or gross blood on DRE.  Plan for plain film of the abdomen to  assess stool burden.  Very low suspicion for small bowel obstruction.  Plan to add more stimulant laxative to the patient's regimen of mostly stool softeners and have outpatient follow-up with PCP.   Plain film reviewed. No acute abdominal findings. Has infiltrate on CXR but no PNA symptoms. Recently treated for PNA. Has f/u with PCP on Tuesday. Granddaughter at bedside. Discussed ED return precautions in detail.   ____________________________________________  FINAL CLINICAL IMPRESSION(S) / ED DIAGNOSES  Final diagnoses:  Constipation, unspecified constipation type     NEW OUTPATIENT MEDICATIONS STARTED DURING THIS VISIT:  Discharge Medication List as of 02/15/2018  1:15 PM    START taking these medications   Details  senna-docusate (SENOKOT-S) 8.6-50 MG tablet Take 1 tablet by mouth at bedtime as needed for mild constipation or moderate constipation., Starting Fri 02/15/2018, Print        Note:  This document was prepared using Dragon voice recognition software and may include unintentional dictation errors.  Alona Bene, MD Emergency Medicine    , Arlyss Repress, MD 02/15/18 1946

## 2018-03-29 ENCOUNTER — Emergency Department (HOSPITAL_COMMUNITY): Payer: Medicare Other

## 2018-03-29 ENCOUNTER — Encounter (HOSPITAL_COMMUNITY): Payer: Self-pay | Admitting: Emergency Medicine

## 2018-03-29 ENCOUNTER — Other Ambulatory Visit: Payer: Self-pay

## 2018-03-29 ENCOUNTER — Observation Stay (HOSPITAL_COMMUNITY)
Admission: EM | Admit: 2018-03-29 | Discharge: 2018-03-31 | Disposition: A | Discharge status: Home / Self Care | Payer: Medicare Other | Attending: Internal Medicine | Admitting: Internal Medicine

## 2018-03-29 DIAGNOSIS — E119 Type 2 diabetes mellitus without complications: Secondary | ICD-10-CM | POA: Diagnosis not present

## 2018-03-29 DIAGNOSIS — M8580 Other specified disorders of bone density and structure, unspecified site: Secondary | ICD-10-CM | POA: Insufficient documentation

## 2018-03-29 DIAGNOSIS — Z7951 Long term (current) use of inhaled steroids: Secondary | ICD-10-CM | POA: Insufficient documentation

## 2018-03-29 DIAGNOSIS — I2510 Atherosclerotic heart disease of native coronary artery without angina pectoris: Secondary | ICD-10-CM | POA: Diagnosis not present

## 2018-03-29 DIAGNOSIS — Z87891 Personal history of nicotine dependence: Secondary | ICD-10-CM | POA: Insufficient documentation

## 2018-03-29 DIAGNOSIS — E039 Hypothyroidism, unspecified: Secondary | ICD-10-CM | POA: Insufficient documentation

## 2018-03-29 DIAGNOSIS — Z7901 Long term (current) use of anticoagulants: Secondary | ICD-10-CM | POA: Insufficient documentation

## 2018-03-29 DIAGNOSIS — I4510 Unspecified right bundle-branch block: Secondary | ICD-10-CM | POA: Diagnosis present

## 2018-03-29 DIAGNOSIS — F0390 Unspecified dementia without behavioral disturbance: Secondary | ICD-10-CM | POA: Insufficient documentation

## 2018-03-29 DIAGNOSIS — E1165 Type 2 diabetes mellitus with hyperglycemia: Secondary | ICD-10-CM

## 2018-03-29 DIAGNOSIS — Z79899 Other long term (current) drug therapy: Secondary | ICD-10-CM | POA: Insufficient documentation

## 2018-03-29 DIAGNOSIS — Z7984 Long term (current) use of oral hypoglycemic drugs: Secondary | ICD-10-CM | POA: Insufficient documentation

## 2018-03-29 DIAGNOSIS — I4891 Unspecified atrial fibrillation: Secondary | ICD-10-CM | POA: Diagnosis present

## 2018-03-29 DIAGNOSIS — N400 Enlarged prostate without lower urinary tract symptoms: Secondary | ICD-10-CM | POA: Diagnosis not present

## 2018-03-29 DIAGNOSIS — Z8673 Personal history of transient ischemic attack (TIA), and cerebral infarction without residual deficits: Secondary | ICD-10-CM | POA: Insufficient documentation

## 2018-03-29 DIAGNOSIS — I10 Essential (primary) hypertension: Secondary | ICD-10-CM | POA: Diagnosis not present

## 2018-03-29 DIAGNOSIS — R079 Chest pain, unspecified: Secondary | ICD-10-CM | POA: Diagnosis not present

## 2018-03-29 DIAGNOSIS — I480 Paroxysmal atrial fibrillation: Secondary | ICD-10-CM | POA: Insufficient documentation

## 2018-03-29 DIAGNOSIS — IMO0002 Reserved for concepts with insufficient information to code with codable children: Secondary | ICD-10-CM | POA: Diagnosis present

## 2018-03-29 DIAGNOSIS — J439 Emphysema, unspecified: Secondary | ICD-10-CM | POA: Diagnosis not present

## 2018-03-29 LAB — BASIC METABOLIC PANEL
ANION GAP: 7 (ref 5–15)
BUN: 16 mg/dL (ref 8–23)
CO2: 25 mmol/L (ref 22–32)
Calcium: 9 mg/dL (ref 8.9–10.3)
Chloride: 105 mmol/L (ref 98–111)
Creatinine, Ser: 1.07 mg/dL (ref 0.61–1.24)
GFR calc non Af Amer: >60 mL/min (ref >60)
Glucose, Bld: 156 mg/dL — ABNORMAL HIGH (ref 70–99)
Potassium: 4.2 mmol/L (ref 3.5–5.1)
SODIUM: 137 mmol/L (ref 135–145)

## 2018-03-29 LAB — I-STAT TROPONIN, ED: Troponin i, poc: 0.01 ng/mL (ref 0.00–0.08)

## 2018-03-29 LAB — CBC
HCT: 41.8 % (ref 39.0–52.0)
Hemoglobin: 13.6 g/dL (ref 13.0–17.0)
MCH: 29.2 pg (ref 26.0–34.0)
MCHC: 32.5 g/dL (ref 30.0–36.0)
MCV: 89.7 fL (ref 80.0–100.0)
NRBC: 0 % (ref 0.0–0.2)
Platelets: 189 10*3/uL (ref 150–400)
RBC: 4.66 MIL/uL (ref 4.22–5.81)
RDW: 13.7 % (ref 11.5–15.5)
WBC: 6.9 10*3/uL (ref 4.0–10.5)

## 2018-03-29 LAB — GLUCOSE, CAPILLARY
Glucose-Capillary: 165 mg/dL — ABNORMAL HIGH (ref 70–99)
Glucose-Capillary: 128 mg/dL — ABNORMAL HIGH (ref 70–99)

## 2018-03-29 LAB — BRAIN NATRIURETIC PEPTIDE: B NATRIURETIC PEPTIDE 5: 95.6 pg/mL (ref 0.0–100.0)

## 2018-03-29 LAB — TROPONIN I

## 2018-03-29 MED ORDER — APIXABAN 5 MG PO TABS
5.0000 mg | ORAL_TABLET | Freq: Two times a day (BID) | ORAL | Status: DC
  Administered 2018-03-29 – 2018-03-31 (×4): 5 mg via ORAL
  Filled 2018-03-29 (×4): qty 1

## 2018-03-29 MED ORDER — INSULIN ASPART 100 UNIT/ML ~~LOC~~ SOLN
0.0000 [IU] | Freq: Three times a day (TID) | SUBCUTANEOUS | Status: DC
  Administered 2018-03-29: 2 [IU] via SUBCUTANEOUS
  Administered 2018-03-30 (×2): 1 [IU] via SUBCUTANEOUS
  Administered 2018-03-30 – 2018-03-31 (×3): 2 [IU] via SUBCUTANEOUS

## 2018-03-29 MED ORDER — LEVOTHYROXINE SODIUM 25 MCG PO TABS
25.0000 ug | ORAL_TABLET | Freq: Every day | ORAL | Status: DC
  Administered 2018-03-30 – 2018-03-31 (×2): 25 ug via ORAL
  Filled 2018-03-29 (×2): qty 1

## 2018-03-29 MED ORDER — AMLODIPINE BESYLATE 5 MG PO TABS
5.0000 mg | ORAL_TABLET | Freq: Every day | ORAL | Status: DC
  Administered 2018-03-29 – 2018-03-31 (×3): 5 mg via ORAL
  Filled 2018-03-29 (×3): qty 1

## 2018-03-29 MED ORDER — POLYVINYL ALCOHOL 1.4 % OP SOLN
2.0000 [drp] | Freq: Four times a day (QID) | OPHTHALMIC | Status: DC
  Administered 2018-03-29 – 2018-03-31 (×5): 2 [drp] via OPHTHALMIC
  Filled 2018-03-29: qty 15

## 2018-03-29 MED ORDER — CARBOXYMETHYLCELLULOSE SODIUM 0.25 % OP SOLN: 2.0000 [drp] | Freq: Four times a day (QID) | OPHTHALMIC | Status: DC

## 2018-03-29 MED ORDER — BRIMONIDINE TARTRATE 0.15 % OP SOLN
1.0000 [drp] | Freq: Two times a day (BID) | OPHTHALMIC | Status: DC
  Administered 2018-03-29 – 2018-03-31 (×4): 1 [drp] via OPHTHALMIC
  Filled 2018-03-29: qty 5

## 2018-03-29 MED ORDER — MOMETASONE FURO-FORMOTEROL FUM 100-5 MCG/ACT IN AERO
2.0000 | INHALATION_SPRAY | Freq: Two times a day (BID) | RESPIRATORY_TRACT | Status: DC
  Administered 2018-03-29 – 2018-03-30 (×3): 2 via RESPIRATORY_TRACT
  Filled 2018-03-29: qty 8.8

## 2018-03-29 MED ORDER — HYDROCHLOROTHIAZIDE 25 MG PO TABS
25.0000 mg | ORAL_TABLET | Freq: Every day | ORAL | Status: DC
  Administered 2018-03-29 – 2018-03-31 (×3): 25 mg via ORAL
  Filled 2018-03-29 (×3): qty 1

## 2018-03-29 MED ORDER — SENNOSIDES-DOCUSATE SODIUM 8.6-50 MG PO TABS: 1.0000 | ORAL_TABLET | Freq: Every evening | ORAL | Status: DC | PRN

## 2018-03-29 MED ORDER — LISINOPRIL 40 MG PO TABS
40.0000 mg | ORAL_TABLET | Freq: Every day | ORAL | Status: DC
  Administered 2018-03-29 – 2018-03-31 (×3): 40 mg via ORAL
  Filled 2018-03-29 (×3): qty 1

## 2018-03-29 MED ORDER — PRAVASTATIN SODIUM 40 MG PO TABS
40.0000 mg | ORAL_TABLET | Freq: Every day | ORAL | Status: DC
  Administered 2018-03-29: 40 mg via ORAL
  Filled 2018-03-29: qty 1

## 2018-03-29 MED ORDER — FINASTERIDE 5 MG PO TABS
5.0000 mg | ORAL_TABLET | Freq: Every day | ORAL | Status: DC
  Administered 2018-03-29 – 2018-03-31 (×3): 5 mg via ORAL
  Filled 2018-03-29 (×3): qty 1

## 2018-03-29 MED ORDER — METOPROLOL TARTRATE 25 MG PO TABS
25.0000 mg | ORAL_TABLET | Freq: Two times a day (BID) | ORAL | Status: DC
  Administered 2018-03-29 – 2018-03-31 (×5): 25 mg via ORAL
  Filled 2018-03-29 (×5): qty 1

## 2018-03-29 MED ORDER — ALBUTEROL SULFATE (2.5 MG/3ML) 0.083% IN NEBU: 2.5000 mg | INHALATION_SOLUTION | RESPIRATORY_TRACT | Status: DC | PRN

## 2018-03-29 MED ORDER — NITROGLYCERIN 0.4 MG SL SUBL: 0.4000 mg | SUBLINGUAL_TABLET | SUBLINGUAL | Status: DC | PRN

## 2018-03-29 MED ORDER — ACETAMINOPHEN 650 MG RE SUPP: 650.0000 mg | Freq: Four times a day (QID) | RECTAL | Status: DC | PRN

## 2018-03-29 MED ORDER — ACETAMINOPHEN 325 MG PO TABS
650.0000 mg | ORAL_TABLET | Freq: Four times a day (QID) | ORAL | Status: DC | PRN
  Administered 2018-03-31 (×2): 650 mg via ORAL
  Filled 2018-03-29 (×2): qty 2

## 2018-03-29 NOTE — Progress Notes (Signed)
Patient arrived on the unit from ER on a stretcher, assessment completed see flowshhet, placed on tele ccmd notified, patient oriented to room and staff, bed in lowest position call bell within reach will continue to monitor.

## 2018-03-29 NOTE — ED Triage Notes (Signed)
Per EMS: pt from home with family with c/o CP that began around 0800 this AM.  Pt self administered 3 Nitro prior to EMS arrival.  Currently CP free.  EMS administered 324 ASA .   Pt has  HX of A-Fib.  Son at the bedside states pt taking BT.    EMS vitals:  BP 135/91 CBG 164  HR 90-120 Sp02 94% RA

## 2018-03-29 NOTE — ED Provider Notes (Signed)
Awakened 8 AM today complaining of anterior chest discomfort.  Pain called after treatment with 3 sublingual nitroglycerin.  EMS treated patient with aspirin 324 mg.  He reports that had similar chest discomfort a few days ago.  Patient is presently asymptomatic on exam alert no distress lungs with diffuse scant rhonchi, coughing occasionally heart irregularly irregular   Doug SouJacubowitz, Selby Foisy, MD 03/29/18 1159

## 2018-03-29 NOTE — ED Provider Notes (Signed)
[facility] EMERGENCY DEPARTMENT Provider Note   CSN: [id number] Arrival date & time: 03/29/18  6606     History   Chief Complaint Chief Complaint  Patient presents with  . Chest Pain    HPI [name] is a 85 y.o. male with h/o COPD, remote tobacco use, DM, CVA, atrial fibrillation, HTN, dementia, hard of hearing, is here for evaluation of chest pain. History obtained directly from patient and EMS report. Pt is oriented to self, place only. Pt states he had sudden onset left sided chest pain when he was sitting down about to eat breakfast. He points to left chest and epigastrium.  Per EMS report pt reported CP around 8 am today.  He received 3 nitroglycerin at home and now he is CP free.  EMS administered 324 mg.  EKG en route shows a-fib with frequent PVCs. Pt on apixaban. No fevers, cough, nausea, vomiting.  Pt has oxygen at home uses it prn.   HPI  Past Medical History:  Diagnosis Date  . Atrial fibrillation (HCC)   . COPD (chronic obstructive pulmonary disease) (HCC)   . CVA (cerebral infarction)   . Diabetes mellitus without complication (HCC)   . Essential hypertension, benign   . Hard of hearing     Patient Active Problem List   Diagnosis Date Noted  . Acute respiratory failure with hypoxia (HCC) 02/02/2018  . Atrial fibrillation (HCC) 05/20/2015  . Acute on chronic respiratory failure with hypoxia (HCC) 05/20/2015  . Chest pain syndrome 05/20/2015  . Acute bronchitis 05/20/2015  . AKI (acute kidney injury) (HCC) 05/20/2015  . RBBB 05/20/2015  . Prolonged Q-T interval on ECG 05/20/2015  . CAD (coronary artery disease) 05/20/2015  . History of CVA (cerebrovascular accident) 05/20/2015  . Atrial fibrillation with RVR (HCC)   . CAP (community acquired pneumonia)   . Elevated troponin   . Viral bronchitis 10/29/2014  . Viral gastroenteritis 10/29/2014  . Bronchiectasis with acute exacerbation (HCC)   . Fever 10/28/2014  . Unstable angina  (HCC) 03/27/2013  . COPD exacerbation (HCC) 03/25/2013  . Diabetes mellitus type II, uncontrolled (HCC) 03/25/2013  . Hypothyroidism 03/25/2013  . BPH (benign prostatic hyperplasia) 03/25/2013  . HTN (hypertension) 03/25/2013    Past Surgical History:  Procedure Laterality Date  . HERNIA REPAIR    . LEFT HEART CATHETERIZATION WITH CORONARY ANGIOGRAM N/A 03/28/2013   Procedure: LEFT HEART CATHETERIZATION WITH CORONARY ANGIOGRAM;  Surgeon: Peter M Swaziland, MD;  Location: [facility] CATH LAB;  Service: Cardiovascular;  Laterality: N/A;        Home Medications    Prior to Admission medications   Medication Sig Start Date End Date Taking? Authorizing Provider  albuterol (PROVENTIL HFA;VENTOLIN HFA) 108 (90 Base) MCG/ACT inhaler Inhale 2 puffs into the lungs every 4 (four) hours as needed for wheezing or shortness of breath. 05/17/15  Yes Horton, Mayer Masker, MD  amLODipine (NORVASC) 5 MG tablet Take 5 mg by mouth daily.   Yes [provider]  apixaban (ELIQUIS) 5 MG TABS tablet Take 1 tablet (5 mg total) by mouth 2 (two) times daily. 02/03/18  Yes Sheikh, Omair Latif, DO  brimonidine (ALPHAGAN) 0.15 % ophthalmic solution Place 1 drop into both eyes 2 (two) times daily.    Yes [provider]  budesonide-formoterol (SYMBICORT) 80-4.5 MCG/ACT inhaler Inhale 2 puffs into the lungs 2 (two) times daily.   Yes [provider]  Carboxymethylcellulose Sodium 0.25 % SOLN Apply 2 drops to eye 4 (four)  times daily.   Yes [provider]  finasteride (PROSCAR) 5 MG tablet Take 5 mg by mouth daily.   Yes [provider]  guaiFENesin (MUCINEX) 600 MG 12 hr tablet Take 2 tablets (1,200 mg total) by mouth 2 (two) times daily. Patient taking differently: Take 1,200 mg by mouth 2 (two) times daily as needed for to loosen phlegm.  02/03/18  Yes Sheikh, Omair Latif, DO  hydrochlorothiazide (HYDRODIURIL) 25 MG tablet Take 25 mg by mouth daily.    Yes [provider]    Ipratropium-Albuterol (COMBIVENT RESPIMAT) 20-100 MCG/ACT AERS respimat Inhale 1 puff into the lungs every 6 (six) hours as needed for wheezing.    Yes [provider]  levalbuterol (XOPENEX) 0.63 MG/3ML nebulizer solution Take 3 mLs (0.63 mg total) by nebulization every 6 (six) hours as needed for wheezing or shortness of breath. 02/03/18  Yes Sheikh, Omair Latif, DO  levothyroxine (SYNTHROID, LEVOTHROID) 25 MCG tablet Take 25 mcg by mouth daily before breakfast.   Yes [provider]  lisinopril (PRINIVIL,ZESTRIL) 40 MG tablet Take 40 mg by mouth daily.   Yes [provider]  metFORMIN (GLUCOPHAGE) 500 MG tablet Take 500 mg by mouth 2 (two) times daily with a meal.    Yes [provider]  metoprolol (LOPRESSOR) 50 MG tablet Take 25 mg by mouth 2 (two) times daily.   Yes [provider]  Multiple Vitamins-Minerals (MULTIVITAMIN WITH MINERALS) tablet Take 1 tablet by mouth daily.   Yes [provider]  nitroGLYCERIN (NITROSTAT) 0.4 MG SL tablet Place 0.4 mg under the tongue every 5 (five) minutes as needed for chest pain. Dissolve one tablet under the tongue every 5 minutes as needed for chest pain. Repeat every 5 minutes if needed for a total of 3 tablets in 15 minutes. If no relief, call 911.   Yes [provider]  omeprazole (PRILOSEC) 20 MG capsule Take 20 mg by mouth daily. .   Yes [provider]  pravastatin (PRAVACHOL) 40 MG tablet Take 40 mg by mouth at bedtime.    Yes [provider]  ranitidine (ZANTAC) 300 MG tablet Take 300 mg by mouth at bedtime. Take one tablet by mouth at bedtime in addition to omeprazole for heartburn/chest pain.   Yes [provider]  senna-docusate (SENOKOT-S) 8.6-50 MG tablet Take 1 tablet by mouth at bedtime as needed for mild constipation or moderate constipation. 02/15/18  Yes Long, Arlyss Repress, MD  Skin Protectants, Misc. (EUCERIN) cream Apply 1 application topically See admin  instructions. Apply small amount to affected area twice a day.   Yes [provider]    Family History Family History  Family history unknown: Yes    Social History Social History   Tobacco Use  . Smoking status: Former Smoker    Types: Cigarettes  . Smokeless tobacco: Former Engineer, water Use Topics  . Alcohol use: No  . Drug use: No     Allergies   Patient has no known allergies.   Review of Systems Review of Systems  Cardiovascular: Positive for chest pain.  Hematological: Bruises/bleeds easily.  All other systems reviewed and are negative.    Physical Exam Updated Vital Signs BP 136/74   Pulse 97   Temp 98.3 F (36.8 C) (Oral)   Resp 20   Ht 5\' 7"  (1.702 m)   Wt 68 kg   SpO2 93%   BMI 23.49 kg/m   Physical Exam Constitutional:  Appearance: He is well-developed.     Comments: NAD. Non toxic.   HENT:     Head: Normocephalic and atraumatic.     Nose: Nose normal.  Eyes:     General: Lids are normal.     Conjunctiva/sclera: Conjunctivae normal.  Neck:     Musculoskeletal: Normal range of motion.     Trachea: Trachea normal.     Comments: Trachea midline.  Cardiovascular:     Rate and Rhythm: Normal rate and regular rhythm.     Pulses:          Radial pulses are 2+ on the right side and 2+ on the left side.       Dorsalis pedis pulses are 2+ on the right side and 2+ on the left side.     Heart sounds: Normal heart sounds, S1 normal and S2 normal.     Comments: No LE edema or calf tenderness.  Pulmonary:     Effort: Pulmonary effort is normal.     Breath sounds: Examination of the right-lower field reveals decreased breath sounds. Examination of the left-lower field reveals decreased breath sounds. Decreased breath sounds present.  Abdominal:     General: Bowel sounds are normal.     Palpations: Abdomen is soft.     Tenderness: There is no abdominal tenderness.     Comments: No epigastric tenderness. No distention.   Skin:     General: Skin is warm and dry.     Capillary Refill: Capillary refill takes less than 2 seconds.     Comments: No rash to chest wall  Neurological:     Mental Status: He is alert.     GCS: GCS eye subscore is 4. GCS verbal subscore is 5. GCS motor subscore is 6.  Psychiatric:        Speech: Speech normal.        Behavior: Behavior normal.        Thought Content: Thought content normal.      ED Treatments / Results  Labs (all labs ordered are listed, but only abnormal results are displayed) Labs Reviewed  BASIC METABOLIC PANEL - Abnormal; Notable for the following components:      Result Value   Glucose, Bld 156 (*)    All other components within normal limits  CBC  BRAIN NATRIURETIC PEPTIDE  I-STAT TROPONIN, ED    EKG EKG Interpretation  Date/Time:  Friday March 29 2018 09:49:46 EST Ventricular Rate:  93 PR Interval:    QRS Duration: 134 QT Interval:  411 QTC Calculation: 512 R Axis:   -91 Text Interpretation:  Atrial fibrillation Ventricular premature complex RBBB and LAFB ST elevation, consider lateral injury No significant change since last tracing Confirmed by Doug Sou [phone]) on 03/29/2018 10:05:03 AM   Radiology Dg Chest 2 View  Result Date: 03/29/2018 CLINICAL DATA:  85 year old male with left chest pain for a few days. EXAM: CHEST - 2 VIEW COMPARISON:  02/15/2018 and earlier. FINDINGS: Centrilobular emphysema demonstrated by CT in November. Stable large lung volumes. Stable mild cardiomegaly. Other mediastinal contours are within normal limits. Visualized tracheal air column is within normal limits. Chronic but increased pulmonary interstitial markings in both lungs. No pneumothorax, pleural effusion or confluent pulmonary opacity. Osteopenia. No acute osseous abnormality identified. Negative visible bowel gas pattern. IMPRESSION: Chronic lung disease with emphysema, and mild cardiomegaly. Increased bilateral pulmonary interstitial markings compared to  November. Consider acute viral/atypical respiratory infection versus mild or developing interstitial edema. No pleural effusion.  Electronically Signed   By: Odessa FlemingH  Hall M.D.   On: 03/29/2018 10:53    Procedures Procedures (including critical care time)  Medications Ordered in ED Medications - No data to display   Initial Impression / Assessment and Plan / ED Course  I have reviewed the triage vital signs and the nursing notes.  Pertinent labs & imaging results that were available during my care of the patient were reviewed by me and considered in my medical decision making (see chart for details).  Clinical Course as of Mar 29 1236  Fri Mar 29, 2018  1115 IMPRESSION: Chronic lung disease with emphysema, and mild cardiomegaly.  Increased bilateral pulmonary interstitial markings compared to November. Consider acute viral/atypical respiratory infection versus mild or developing interstitial edema. No pleural effusion.  DG Chest 2 View [CG]    Clinical Course User Index [CG] Jerrell MylarGibbons, Claudia J, PA-C   LHC on 2015 with nonobstructive CAD and normal LV function. Echo 2017 EF 65-70%. Cardiac risk factors include HTN, diabetes, age. CP free after nitroglycerin. H/o a-fib on apixaban.  Uses oxygen at home prn. SpO2 91-93% at rest in ER.   1158: Work up remarkable for a-fib rate controlled, PVCs.  Trop 0.01. CXR with increased interstitial markings vs ?viral vs atypical respiratory infection. Pt does have a cough x 2 days.  No fever, myalgias.  Given age, cardiac risk factors, will speak to hospitalist team for admission for CP rule out. [facility] 2015. Shared with ED MD.  Final Clinical Impressions(s) / ED Diagnoses   Final diagnoses:  None    ED Discharge Orders    None       Liberty HandyGibbons, Claudia J, PA-C 03/29/18 1238    Doug SouJacubowitz, Sam, MD 03/29/18 1733

## 2018-03-29 NOTE — H&P (Signed)
Date: 03/29/2018               Patient Name:  [name] MRN: [id number]  DOB: 12/30/1927 Age / Sex: 85 y.o., male   [facility]P: [facility]         Medical Service: Internal Medicine Teaching Service         Attending Physician: Dr. Rogelia Boga, Austin Miles, MD    First Contact: Dr. Julian Hy MD Pager: [phone]  Second Contact: Dr. Randa Lynn MD Pager: [phone]       After Hours (After 5p/  First Contact Pager: [phone]  weekends / holidays): Second Contact Pager: [phone]   Chief Complaint: Chest pain  History of Present Illness: [name] is an 85 year old male with CAD, COPD, remote tobacco use quit 20 years ago, diabetes, CVA, atrial fibrillation, hypertension, dementia, hard of hearing, who presented with chest pain.  [name] states that he woke up this morning around 4-5 AM. As he was sitting down to drink his coffee, he began to have nonradiating left-sided chest pain.  He became very concerned and then called EMS.  Per EMS report patient self administered 3 nitro prior to EMS arrival.  They also administered aspirin.  [name] states that his chest pain stopped in the ambulance.  He has had a cough for the last 2 days.  He denies con commitment shortness of breath, nausea, abdominal pain, diaphoresis.  He states that he has intermittently had random episodes of chest pain over the last several years which have self resolved.  He denies association with walking or exertion.  Cardiac history includes: He was admitted in 2015 for a COPD exacerbation and was found to have recurrent chest pain at rest.  Left heart catheter showed nonobstructive CAD with normal LV function.   2015 complete left heart cath results: Left anterior descending (LAD): 30% stenosis at first diagonal takeoff. 30% proximal first diagonal.  Ramus intermediate: moderate size, 20-30% proximal. Left circumflex (LCx): 40% stenosis in the distal vessel prior to a large terminal OM. Right coronary  artery (RCA): Mild irregularities less than 20%. Left ventriculography: Left ventricular systolic function is normal, LVEF is estimated at 55-65%, there is no significant mitral regurgitation   2017 TEE shows EF of 65 to 70% with grade 1 diastolic dysfunction.  Mild pulmonic valve regurg.  Mild calcified aortic valve.  In the ED he was found to have atrial fibrillation, rate controlled, PVCs.  Troponin 0 0.01.  Chest x-ray showed increasing interstitial markings compared to on November chest x-ray.  Meds:  Current Meds  Medication Sig  . albuterol (PROVENTIL HFA;VENTOLIN HFA) 108 (90 Base) MCG/ACT inhaler Inhale 2 puffs into the lungs every 4 (four) hours as needed for wheezing or shortness of breath.  Marland Kitchen amLODipine (NORVASC) 5 MG tablet Take 5 mg by mouth daily.  Marland Kitchen apixaban (ELIQUIS) 5 MG TABS tablet Take 1 tablet (5 mg total) by mouth 2 (two) times daily.  . brimonidine (ALPHAGAN) 0.15 % ophthalmic solution Place 1 drop into both eyes 2 (two) times daily.   . budesonide-formoterol (SYMBICORT) 80-4.5 MCG/ACT inhaler Inhale 2 puffs into the lungs 2 (two) times daily.  . Carboxymethylcellulose Sodium 0.25 % SOLN Apply 2 drops to eye 4 (four) times daily.  . finasteride (PROSCAR) 5 MG tablet Take 5 mg by mouth daily.  Marland Kitchen guaiFENesin (MUCINEX) 600 MG 12 hr tablet Take 2 tablets (1,200 mg total) by mouth 2 (two) times daily. (Patient taking differently: Take  1,200 mg by mouth 2 (two) times daily as needed for to loosen phlegm. )  . hydrochlorothiazide (HYDRODIURIL) 25 MG tablet Take 25 mg by mouth daily.   . Ipratropium-Albuterol (COMBIVENT RESPIMAT) 20-100 MCG/ACT AERS respimat Inhale 1 puff into the lungs every 6 (six) hours as needed for wheezing.   . levalbuterol (XOPENEX) 0.63 MG/3ML nebulizer solution Take 3 mLs (0.63 mg total) by nebulization every 6 (six) hours as needed for wheezing or shortness of breath.  . levothyroxine (SYNTHROID, LEVOTHROID) 25 MCG tablet Take 25 mcg by mouth daily  before breakfast.  . lisinopril (PRINIVIL,ZESTRIL) 40 MG tablet Take 40 mg by mouth daily.  . metFORMIN (GLUCOPHAGE) 500 MG tablet Take 500 mg by mouth 2 (two) times daily with a meal.   . metoprolol (LOPRESSOR) 50 MG tablet Take 25 mg by mouth 2 (two) times daily.  . Multiple Vitamins-Minerals (MULTIVITAMIN WITH MINERALS) tablet Take 1 tablet by mouth daily.  . nitroGLYCERIN (NITROSTAT) 0.4 MG SL tablet Place 0.4 mg under the tongue every 5 (five) minutes as needed for chest pain. Dissolve one tablet under the tongue every 5 minutes as needed for chest pain. Repeat every 5 minutes if needed for a total of 3 tablets in 15 minutes. If no relief, call 911.  . omeprazole (PRILOSEC) 20 MG capsule Take 20 mg by mouth daily. .  . pravastatin (PRAVACHOL) 40 MG tablet Take 40 mg by mouth at bedtime.   . ranitidine (ZANTAC) 300 MG tablet Take 300 mg by mouth at bedtime. Take one tablet by mouth at bedtime in addition to omeprazole for heartburn/chest pain.  Marland Kitchen. senna-docusate (SENOKOT-S) 8.6-50 MG tablet Take 1 tablet by mouth at bedtime as needed for mild constipation or moderate constipation.  . Skin Protectants, Misc. (EUCERIN) cream Apply 1 application topically See admin instructions. Apply small amount to affected area twice a day.     Allergies: Allergies as of 03/29/2018  . (No Known Allergies)   Past Medical History:  Diagnosis Date  . Atrial fibrillation (HCC)   . COPD (chronic obstructive pulmonary disease) (HCC)   . CVA (cerebral infarction)   . Diabetes mellitus without complication (HCC)   . Essential hypertension, benign   . Hard of hearing     Family History: Unknown family history  Social History: Quit smoking 20 years ago.  Does not drink alcohol or use drugs.  Worked in [employer] up until 1989 when he retired.  [place]  Review of Systems: A complete ROS was negative except as per HPI.   Physical Exam: Blood pressure 136/74, pulse 97,  temperature 98.3 F (36.8 C), temperature source Oral, resp. rate 20, height 5\' 7"  (1.702 m), weight 68 kg, SpO2 93 %. Physical Exam Vitals signs and nursing note reviewed.  Constitutional:      Comments: Elderly male lying in bed in no acute distress.  HENT:     Head: Normocephalic and atraumatic.  Cardiovascular:     Rate and Rhythm: Normal rate and regular rhythm.     Heart sounds: Normal heart sounds. No murmur.  Pulmonary:     Effort: Pulmonary effort is normal. No tachypnea or respiratory distress.     Breath sounds: Normal breath sounds.  Chest:     Comments: No chest wall tenderness. Musculoskeletal:     Right lower leg: He exhibits no tenderness. No edema.     Left lower leg: He exhibits no tenderness. No edema.  Skin:    General:  Skin is warm and dry.  Neurological:     Mental Status: He is alert.  Psychiatric:        Mood and Affect: Mood normal.        Behavior: Behavior normal.     Labs: CBC unremarkable BMP unremarkable I-STAT troponin 0.01 BNP 95.6  EKG: personally reviewed my interpretation is atrial fibrillation with right bundle branch block, ventricular premature complex.   CXR: personally reviewed my interpretation is increased interstitial markings compared to November chest x-ray.  Hyperinflation of the lungs.  Assessment & Plan by Problem: Active Problems:   Chest pain  [name] is an 85 year old male with CAD, COPD, remote tobacco use, diabetes, CVA, atrial fibrillation, hypertension, and mild dementia who presents with typical chest pain (relieved with nitroglycerin) which has now resolved. Heart score of 6 (2-16.6% risk of adverse cardiac event). TIMI score of 2 (8% risk at 14 days of: all-cause mortality, new or recurrent MI, or severe recurrent ischemia requiring urgent revascularization). Due to his known CAD and increased cardiovascular risk factors, he will be admitted for ACS rule out.  Chest pain:  - Continuous cardiac monitoring  -  Repeat EKG tomorrow morning - Trend troponins - Nitroglycerin 0.4 mg sublingual PRN chest pain  COPD: Respiratory status stable. No increased work of breathing and normal oxygen saturations. - Continue home Dulera 2 puffs BID - Continue Proventil q4h PRN  Type 2 diabetes: Home meds include metformin 500 mg BID. Will start SSI while in hospital.  - Sliding scale insulin-sensitive  Hypertension: Home regiment includes HCTZ, lisinopril, metoprolol, amlodipine. His last dose of these meds was yesterday. His blood pressure was found to be above goal 120's-150's SBP. Will restart his home antihypertensives now.  - Continue amlodipine 5 mg - Continue HCTZ 25 mg QD - Continue Lisinopril 40 mg QD - Continue metoprolol tartrate 40 mg QD  Atrial fibrillation: EKG upon admission showed afib which is rate controlled. - Continue home metoprolol tartrate 40 mg QD - Continue home Apixaban 5 mg QD  Dispo: Admit patient to Observation with expected length of stay less than 2 midnights.  Signed: Synetta Shadow, MD 03/29/2018, 12:44 PM  Pager: [phone]

## 2018-03-30 ENCOUNTER — Encounter (HOSPITAL_COMMUNITY): Payer: Self-pay | Admitting: Cardiology

## 2018-03-30 DIAGNOSIS — E039 Hypothyroidism, unspecified: Secondary | ICD-10-CM

## 2018-03-30 DIAGNOSIS — F0390 Unspecified dementia without behavioral disturbance: Secondary | ICD-10-CM

## 2018-03-30 DIAGNOSIS — K219 Gastro-esophageal reflux disease without esophagitis: Secondary | ICD-10-CM

## 2018-03-30 DIAGNOSIS — N400 Enlarged prostate without lower urinary tract symptoms: Secondary | ICD-10-CM | POA: Diagnosis not present

## 2018-03-30 DIAGNOSIS — I25110 Atherosclerotic heart disease of native coronary artery with unstable angina pectoris: Secondary | ICD-10-CM | POA: Diagnosis not present

## 2018-03-30 DIAGNOSIS — Z7901 Long term (current) use of anticoagulants: Secondary | ICD-10-CM

## 2018-03-30 DIAGNOSIS — E119 Type 2 diabetes mellitus without complications: Secondary | ICD-10-CM

## 2018-03-30 DIAGNOSIS — R072 Precordial pain: Secondary | ICD-10-CM

## 2018-03-30 DIAGNOSIS — Z79890 Hormone replacement therapy (postmenopausal): Secondary | ICD-10-CM

## 2018-03-30 DIAGNOSIS — Z79899 Other long term (current) drug therapy: Secondary | ICD-10-CM

## 2018-03-30 DIAGNOSIS — Z7984 Long term (current) use of oral hypoglycemic drugs: Secondary | ICD-10-CM

## 2018-03-30 DIAGNOSIS — J449 Chronic obstructive pulmonary disease, unspecified: Secondary | ICD-10-CM

## 2018-03-30 DIAGNOSIS — Z9861 Coronary angioplasty status: Secondary | ICD-10-CM

## 2018-03-30 DIAGNOSIS — I480 Paroxysmal atrial fibrillation: Secondary | ICD-10-CM

## 2018-03-30 DIAGNOSIS — Z720 Tobacco use: Secondary | ICD-10-CM

## 2018-03-30 DIAGNOSIS — I10 Essential (primary) hypertension: Secondary | ICD-10-CM

## 2018-03-30 DIAGNOSIS — I4891 Unspecified atrial fibrillation: Secondary | ICD-10-CM

## 2018-03-30 DIAGNOSIS — R079 Chest pain, unspecified: Secondary | ICD-10-CM | POA: Diagnosis not present

## 2018-03-30 DIAGNOSIS — Z7951 Long term (current) use of inhaled steroids: Secondary | ICD-10-CM

## 2018-03-30 DIAGNOSIS — I4510 Unspecified right bundle-branch block: Secondary | ICD-10-CM

## 2018-03-30 LAB — GLUCOSE, CAPILLARY
GLUCOSE-CAPILLARY: 140 mg/dL — AB (ref 70–99)
Glucose-Capillary: 140 mg/dL — ABNORMAL HIGH (ref 70–99)
Glucose-Capillary: 198 mg/dL — ABNORMAL HIGH (ref 70–99)
Glucose-Capillary: 222 mg/dL — ABNORMAL HIGH (ref 70–99)

## 2018-03-30 LAB — TSH: TSH: 5.011 u[IU]/mL — ABNORMAL HIGH (ref 0.350–4.500)

## 2018-03-30 LAB — TROPONIN I: Troponin I: <0.03 ng/mL (ref <0.03)

## 2018-03-30 MED ORDER — ATORVASTATIN CALCIUM 40 MG PO TABS
40.0000 mg | ORAL_TABLET | Freq: Every day | ORAL | Status: DC
  Administered 2018-03-30: 40 mg via ORAL
  Filled 2018-03-30 (×2): qty 1

## 2018-03-30 MED ORDER — ASPIRIN EC 81 MG PO TBEC
81.0000 mg | DELAYED_RELEASE_TABLET | Freq: Every day | ORAL | Status: DC
  Administered 2018-03-30: 81 mg via ORAL
  Filled 2018-03-30: qty 1

## 2018-03-30 NOTE — Progress Notes (Signed)
  Date: 03/30/2018  Patient name: [name]  Medical record number: [id number]  Date of birth: 03-20-1928   I have seen and evaluated [name]  and discussed their care with the Residency Team.  [name] is an 85 year old community dwelling man with known CAD status post left heart cath in 2015 showing nonobstructive CAD.  He developed left-sided chest pain this morning while sitting at breakfast.  He took a total of 3 nitroglycerin and called EMS and the pain resolved in route.  He has not had resumption of his pain.  Medications include  Eliquis and metoprolol for atrial fibrillation Amlodipine, hydrochlorothiazide, lisinopril for hypertension Pravastatin 40 for CAD Finasteride for BPH Albuterol, Symbicort, Combivent, and Xopenex for COPD Synthroid for hypothyroidism Metformin for diabetes Prilosec and Zantac for GERD  Vitals:   03/30/18 0914 03/30/18 1204  BP: (!) 145/46 (!) 146/57  Pulse:  60  Resp:  (!) 22  Temp:  (!) 97.5 F (36.4 C)  SpO2:    T 97.5, HR 60, RR 22, 97% O2 sat Gen NAD, lying in bed comfortably HRRR no MRG ABD + BS, soft Ext no edema  Creatinine 1.07 Albumin 3.0 Troponin negative x3 BNP 96  I personally viewed the CXR images and confirmed my reading with the official read.  PA and lateral, increased interstitial markings.  No edema or effusion.  I personally viewed the EKG and confirmed my reading with the official read.  EKG on admission showed A. fib and RBBB.  EKG on the fourth showed sinus rhythm, RBBB.  Assessment and Plan: I have seen and evaluated the patient as outlined above. I agree with the formulated Assessment and Plan as detailed in the residents' note, with the following changes: [name] is an 85 year old community dwelling man with known CAD status post left heart cath in 2015 showing nonobstructive CAD.  He came to the ED via EMS for chest pain.  Typical elements include resolution of the pain with nitroglycerin and left  sided pain.  He cannot characterize the pain though.  Due to the possibility of unstable angina in a man with known CAD and nonobstructive lesions on cath and 2015 we will call this unstable angina and consult cardiology.  We will escalate his statin to high intensity and continue his aspirin and a beta-blocker.  1.  Unstable angina Consult cardiology Intensify statin to high intensity Continue aspirin and beta-blocker  Bruce Spain, MD 1/4/20201:06 PM

## 2018-03-30 NOTE — Progress Notes (Addendum)
   Subjective: [name] has not had any new episodes of chest pain since yesterday with EMS. He denies SOB and abdominal pain.   Objective:  Vital signs in last 24 hours: Vitals:   03/29/18 1619 03/29/18 2012 03/29/18 2025 03/30/18 0418  BP:  (!) 154/68  (!) 129/52  Pulse: 66 61  62  Resp:  16  (!) 21  Temp:  98.6 F (37 C)  98.5 F (36.9 C)  TempSrc:  Oral  Oral  SpO2:  91% 93% 95%  Weight:      Height:       Physical Exam Vitals signs and nursing note reviewed.  Constitutional:      Appearance: He is well-developed.  Cardiovascular:     Heart sounds: Normal heart sounds. No murmur.  Pulmonary:     Effort: Pulmonary effort is normal. No tachypnea or respiratory distress.  Neurological:     Mental Status: He is alert.     Assessment/Plan:  Active Problems:   Diabetes mellitus type II, uncontrolled (HCC)   HTN (hypertension)   Atrial fibrillation (HCC)   RBBB   Chest pain  [name] is an 85 year old male with CAD, COPD, remote tobacco use, diabetes, CVA, atrial fibrillation, hypertension, and mild dementia who presents with unstable angina which has now resolved. He is at moderate risk of an acute cardiac ischemic event (heart score of 6, TIMI score 2). Troponins have remained negative x3 and EKG this morning was unremarkable (still showing chronic RBBB). Consulted cardiology for further recommendations.  Chest pain:  - Continuous cardiac monitoring  - Nitroglycerin 0.4 mg sublingual PRN chest pain - Add aspirin 81 mg daily - Switch home pravastatin to atorvastatin 40 mg QD  COPD: Respiratory status remains stable. - Continue home Dulera 2 puffs BID - Continue Proventil q4h PRN  Type 2 diabetes: Home meds include metformin 500 mg BID. SSI while in the hospital. Blood sugars have remained WNL. - Continue Sliding scale insulin-sensitive  Hypertension: Home regiment includes HCTZ, lisinopril, metoprolol, amlodipine. Blood pressure well-controlled on the  meds. - Continue amlodipine 5 mg - Continue HCTZ 25 mg QD - Continue Lisinopril 40 mg QD - Continue metoprolol tartrate 40 mg QD  Atrial fibrillation: EKG upon admission showed afib which is rate controlled.Currently in NSR. - Continue home metoprolol tartrate 40 mg QD - Continue home Apixaban 5 mg QD  Dispo: Anticipated discharge in approximately 1-2 days.  Synetta Shadow, MD 03/30/2018, 8:41 AM Pager: [phone]

## 2018-03-30 NOTE — Consult Note (Signed)
Cardiology Consultation:   Patient ID: [id number] MRN: [id number]; DOB: [birth year]  Admit date: 03/29/2018 Date of Consult: 03/30/2018  Primary [facility] Provider: [facility] Primary Cardiologist: Dr Diona Browner    Patient Profile:   [name] is a 85 y.o. male with a hx of diabetes mellitus, hypertension, paroxysmal atrial fibrillation, prior CVA who is being seen today for the evaluation of chest pain at the request of Blanch Media MD.  History of Present Illness:   Patient had cardiac catheterization January 2015.  At that time he was found to have a 30% LAD, 30% first diagonal, 30% ramus, 40% circumflex, 20% right coronary artery and ejection fraction 55 to 65%.  Echocardiogram February 2017 showed vigorous LV function, grade 1 diastolic dysfunction and mild left atrial enlargement.  Patient admitted with chest pain and cardiology asked to evaluate.  Patient has had occasional chest pain for years by his report.  Most recent episode was yesterday.  It was in the left breast area without radiation or associated symptoms.  Lasted 1 hour and resolve spontaneously.  Not pleuritic, positional, exertional or related to food.  He typically does not have exertional chest pain.  Occasional dyspnea on exertion but no orthopnea, PND or pedal edema.  Cardiology now asked to evaluate.  Past Medical History:  Diagnosis Date  . Atrial fibrillation (HCC)   . COPD (chronic obstructive pulmonary disease) (HCC)   . CVA (cerebral infarction)   . Diabetes mellitus without complication (HCC)   . Essential hypertension, benign   . Hard of hearing     Past Surgical History:  Procedure Laterality Date  . HERNIA REPAIR    . LEFT HEART CATHETERIZATION WITH CORONARY ANGIOGRAM N/A 03/28/2013   Procedure: LEFT HEART CATHETERIZATION WITH CORONARY ANGIOGRAM;  Surgeon: Peter M Swaziland, MD;  Location: [facility] CATH LAB;  Service: Cardiovascular;  Laterality: N/A;     Inpatient  Medications: Scheduled Meds: . amLODipine  5 mg Oral Daily  . apixaban  5 mg Oral BID  . aspirin EC  81 mg Oral Daily  . atorvastatin  40 mg Oral q1800  . brimonidine  1 drop Both Eyes BID  . finasteride  5 mg Oral Daily  . hydrochlorothiazide  25 mg Oral Daily  . insulin aspart  0-9 Units Subcutaneous TID WC  . levothyroxine  25 mcg Oral QAC breakfast  . lisinopril  40 mg Oral Daily  . metoprolol tartrate  25 mg Oral BID  . mometasone-formoterol  2 puff Inhalation BID  . polyvinyl alcohol  2 drop Both Eyes QID   Continuous Infusions:  PRN Meds: acetaminophen **OR** acetaminophen, albuterol, nitroGLYCERIN, senna-docusate  Allergies:   No Known Allergies  Social History:   Social History   Socioeconomic History  . Marital status: Married    Spouse name: Not on file  . Number of children: Not on file  . Years of education: Not on file  . Highest education level: Not on file  Occupational History  . Not on file  Social Needs  . Financial resource strain: Not on file  . Food insecurity:    Worry: Not on file    Inability: Not on file  . Transportation needs:    Medical: Not on file    Non-medical: Not on file  Tobacco Use  . Smoking status: Former Smoker    Types: Cigarettes  . Smokeless tobacco: Former Engineer, water and Sexual Activity  . Alcohol use: No  . Drug use: No  .  Sexual activity: Not on file  Lifestyle  . Physical activity:    Days per week: Not on file    Minutes per session: Not on file  . Stress: Not on file  Relationships  . Social connections:    Talks on phone: Not on file    Gets together: Not on file    Attends religious service: Not on file    Active member of club or organization: Not on file    Attends meetings of clubs or organizations: Not on file    Relationship status: Not on file  . Intimate partner violence:    Fear of current or ex partner: Not on file    Emotionally abused: Not on file    Physically abused: Not on file     Forced sexual activity: Not on file  Other Topics Concern  . Not on file  Social History Narrative  . Not on file    Family History:    Family History  Problem Relation Age of Onset  . CAD Son      ROS:  Please see the history of present illness.  Denies fevers, chills, productive cough, hemoptysis, melena.  Mild arthralgias. All other ROS reviewed and negative.     Physical Exam/Data:   Vitals:   03/30/18 0418 03/30/18 0841 03/30/18 0914 03/30/18 1204  BP: (!) 129/52  (!) 145/46 (!) 146/57  Pulse: 62   60  Resp: (!) 21   (!) 22  Temp: 98.5 F (36.9 C)   (!) 97.5 F (36.4 C)  TempSrc: Oral   Oral  SpO2: 95% 97%    Weight:      Height:        Intake/Output Summary (Last 24 hours) at 03/30/2018 1525 Last data filed at 03/29/2018 1700 Gross per 24 hour  Intake 480 ml  Output -  Net 480 ml   Filed Weights   03/29/18 1009  Weight: 68 kg   Body mass index is 23.49 kg/m.  General:  Well nourished, well developed, in no acute distress HEENT: normal Lymph: no adenopathy Neck: no JVD Endocrine:  No thryomegaly Vascular: No carotid bruits; FA pulses 2+ bilaterally without bruits  Cardiac:  normal S1, S2; RRR; no murmur  Lungs:  Diminished BS throughout Abd: soft, nontender, no hepatomegaly  Ext: no edema Musculoskeletal:  No deformities, BUE and BLE strength normal and equal Skin: warm and dry  Neuro:  CNs 2-12 intact, no focal abnormalities noted Psych:  Normal affect   EKG:  The EKG was personally reviewed and demonstrates: Atrial fibrillation with PVCs or aberrantly conducted beats, left anterior fascicular block, right bundle branch block, cannot rule out prior anterior infarct.  Follow-up ECG shows sinus rhythm, left anterior fascicular block, right bundle branch block, no ST changes  Telemetry:  Telemetry was personally reviewed and demonstrates:  Sinus rhythm   Laboratory Data:  Chemistry Recent Labs  Lab 03/29/18 1006  NA 137  K 4.2  CL 105  CO2 25   GLUCOSE 156*  BUN 16  CREATININE 1.07  CALCIUM 9.0  GFRNONAA >60  GFRAA >60  ANIONGAP 7    Hematology Recent Labs  Lab 03/29/18 1006  WBC 6.9  RBC 4.66  HGB 13.6  HCT 41.8  MCV 89.7  MCH 29.2  MCHC 32.5  RDW 13.7  PLT 189   Cardiac Enzymes Recent Labs  Lab 03/29/18 1356 03/29/18 1849 03/30/18 0107  TROPONINI <0.03 <0.03 <0.03    Recent Labs  Lab 03/29/18  1027  TROPIPOC 0.01    BNP Recent Labs  Lab 03/29/18 1121  BNP 95.6   Radiology/Studies:  Dg Chest 2 View  Result Date: 03/29/2018 CLINICAL DATA:  85 year old male with left chest pain for a few days. EXAM: CHEST - 2 VIEW COMPARISON:  02/15/2018 and earlier. FINDINGS: Centrilobular emphysema demonstrated by CT in November. Stable large lung volumes. Stable mild cardiomegaly. Other mediastinal contours are within normal limits. Visualized tracheal air column is within normal limits. Chronic but increased pulmonary interstitial markings in both lungs. No pneumothorax, pleural effusion or confluent pulmonary opacity. Osteopenia. No acute osseous abnormality identified. Negative visible bowel gas pattern. IMPRESSION: Chronic lung disease with emphysema, and mild cardiomegaly. Increased bilateral pulmonary interstitial markings compared to November. Consider acute viral/atypical respiratory infection versus mild or developing interstitial edema. No pleural effusion. Electronically Signed   By: Odessa FlemingH  Hall M.D.   On: 03/29/2018 10:53    Assessment and Plan:   1. Chest pain-symptoms are atypical.  Enzymes are negative.  Electrocardiogram with no ST changes.  He did have a cardiac catheterization in 2015 that showed nonobstructive coronary disease.  I will arrange a Lexiscan nuclear study for risk stratification tomorrow. 2. Paroxysmal atrial fibrillation-patient was noted to be in atrial fibrillation at time of admission.  However he has converted to sinus rhythm.  His rate was controlled and we will continue with present dose  of metoprolol. CHADSvasc 6.  Continue apixaban. 3. Hypertension-blood pressure is controlled.  Continue present medications and follow. 4. Nonobstructive CAD-continue statin.  Discontinue aspirin given need for anticoagulation. 5. COPD-Per primary care.  For questions or updates, please contact [facility] Please consult [email or web address] for contact info under     Signed, Olga MillersBrian Zayaan Kozak, MD  03/30/2018 3:25 PM

## 2018-03-31 ENCOUNTER — Observation Stay (HOSPITAL_BASED_OUTPATIENT_CLINIC_OR_DEPARTMENT_OTHER): Payer: Medicare Other

## 2018-03-31 DIAGNOSIS — R072 Precordial pain: Secondary | ICD-10-CM

## 2018-03-31 LAB — NM MYOCAR MULTI W/SPECT W/WALL MOTION / EF
CSEPPHR: 75 {beats}/min
Rest HR: 55 {beats}/min

## 2018-03-31 LAB — GLUCOSE, CAPILLARY
Glucose-Capillary: 167 mg/dL — ABNORMAL HIGH (ref 70–99)
Glucose-Capillary: 177 mg/dL — ABNORMAL HIGH (ref 70–99)

## 2018-03-31 MED ORDER — ATORVASTATIN CALCIUM 40 MG PO TABS: 40.0000 mg | ORAL_TABLET | Freq: Every day | ORAL | 0 refills | Status: DC

## 2018-03-31 MED ORDER — REGADENOSON 0.4 MG/5ML IV SOLN
INTRAVENOUS | Status: AC
  Filled 2018-03-31: qty 5

## 2018-03-31 MED ORDER — TECHNETIUM TC 99M TETROFOSMIN IV KIT
30.0000 | PACK | Freq: Once | INTRAVENOUS | Status: AC | PRN
  Administered 2018-03-31: 30 via INTRAVENOUS

## 2018-03-31 MED ORDER — ONDANSETRON HCL 4 MG/2ML IJ SOLN
4.0000 mg | Freq: Three times a day (TID) | INTRAMUSCULAR | Status: DC | PRN
  Administered 2018-03-31: 4 mg via INTRAVENOUS
  Filled 2018-03-31: qty 2

## 2018-03-31 MED ORDER — REGADENOSON 0.4 MG/5ML IV SOLN
0.4000 mg | Freq: Once | INTRAVENOUS | Status: AC
  Administered 2018-03-31: 0.4 mg via INTRAVENOUS
  Filled 2018-03-31: qty 5

## 2018-03-31 MED ORDER — TECHNETIUM TC 99M TETROFOSMIN IV KIT
10.0000 | PACK | Freq: Once | INTRAVENOUS | Status: AC | PRN
  Administered 2018-03-31: 10 via INTRAVENOUS

## 2018-03-31 NOTE — Progress Notes (Signed)
Progress Note  Patient Name: [name] Date of Encounter: 03/31/2018  Primary Cardiologist: Dr Diona Browner  Subjective   Mildly confused; denies CP or dyspnea  Inpatient Medications    Scheduled Meds: . amLODipine  5 mg Oral Daily  . apixaban  5 mg Oral BID  . atorvastatin  40 mg Oral q1800  . brimonidine  1 drop Both Eyes BID  . finasteride  5 mg Oral Daily  . hydrochlorothiazide  25 mg Oral Daily  . insulin aspart  0-9 Units Subcutaneous TID WC  . levothyroxine  25 mcg Oral QAC breakfast  . lisinopril  40 mg Oral Daily  . metoprolol tartrate  25 mg Oral BID  . mometasone-formoterol  2 puff Inhalation BID  . polyvinyl alcohol  2 drop Both Eyes QID  . regadenoson       Continuous Infusions:  PRN Meds: acetaminophen **OR** acetaminophen, albuterol, nitroGLYCERIN, senna-docusate   Vital Signs    Vitals:   03/31/18 0913 03/31/18 0915 03/31/18 1112 03/31/18 1115  BP: (!) 131/56 (!) 134/56 (!) 151/54   Pulse:    64  Resp:      Temp:      TempSrc:      SpO2:      Weight:      Height:       No intake or output data in the 24 hours ending 03/31/18 1208 Filed Weights   03/29/18 1009  Weight: 68 kg    Telemetry    Sinus- Personally Reviewed  Physical Exam   GEN: No acute distress.   Neck: No JVD Cardiac: RRR, no murmurs, rubs, or gallops.  Respiratory: Clear to auscultation bilaterally. GI: Soft, nontender, non-distended  MS: No edema Neuro:  Nonfocal; mildly confused   Labs    Chemistry Recent Labs  Lab 03/29/18 1006  NA 137  K 4.2  CL 105  CO2 25  GLUCOSE 156*  BUN 16  CREATININE 1.07  CALCIUM 9.0  GFRNONAA >60  GFRAA >60  ANIONGAP 7     Hematology Recent Labs  Lab 03/29/18 1006  WBC 6.9  RBC 4.66  HGB 13.6  HCT 41.8  MCV 89.7  MCH 29.2  MCHC 32.5  RDW 13.7  PLT 189    Cardiac Enzymes Recent Labs  Lab 03/29/18 1356 03/29/18 1849 03/30/18 0107  TROPONINI <0.03 <0.03 <0.03    Recent Labs  Lab 03/29/18 1027    TROPIPOC 0.01     BNP Recent Labs  Lab 03/29/18 1121  BNP 95.6     Radiology    Nm Myocar Multi W/spect W/wall Motion / Ef  Result Date: 03/31/2018 CLINICAL DATA:  Chest tightness. EXAM: MYOCARDIAL IMAGING WITH SPECT (REST AND PHARMACOLOGIC-STRESS) GATED LEFT VENTRICULAR WALL MOTION STUDY LEFT VENTRICULAR EJECTION FRACTION TECHNIQUE: Standard myocardial SPECT imaging was performed after resting intravenous injection of 10 mCi Tc-68m tetrofosmin. Subsequently, intravenous infusion of Lexiscan was performed under the supervision of the Cardiology staff. At peak effect of the drug, 30 mCi Tc-26m tetrofosmin was injected intravenously and standard myocardial SPECT imaging was performed. Quantitative gated imaging was also performed to evaluate left ventricular wall motion, and estimate left ventricular ejection fraction. COMPARISON:  05/22/2015 FINDINGS: Perfusion: There is a medium size focus of mild reversibility involving the proximal and mid inferior septum. Decreased activity along the inferior wall on rest and stress images without wall motion abnormality compatible with attenuation artifact. Wall Motion: Mmild septal hypokinesis. Left Ventricular Ejection Fraction: 58 % End diastolic volume 86 ml30  End systolic volume 37 ml IMPRESSION: 1. Medium size area of mild reversibility involves the inferior septum. The summed difference score is equal to 6. 2. Mild septal hypokinesis. 3. Left ventricular ejection fraction 55% 4. Non invasive risk stratification*: Intermediate *2012 Appropriate Use Criteria for Coronary Revascularization Focused Update: J Am Coll Cardiol. 2012;59(9):857-881. http://content.dementiazones.com.aspx?articleid=[email or web address] Electronically Signed   By: Signa Kell M.D.   On: 03/31/2018 10:59    Patient Profile     [name] is a 85 y.o. male with a hx of diabetes mellitus, hypertension, paroxysmal atrial fibrillation, prior CVA who is being seen for the evaluation of  chest pain at the request of Blanch Media MD. Patient had cardiac catheterization January 2015.  At that time he was found to have a 30% LAD, 30% first diagonal, 30% ramus, 40% circumflex, 20% right coronary artery and ejection fraction 55 to 65%.  Echocardiogram February 2017 showed vigorous LV function, grade 1 diastolic dysfunction and mild left atrial enlargement.    Assessment & Plan    1. Chest pain-symptoms atypical.  Enzymes are negative.  Electrocardiogram with no ST changes.  He did have a cardiac catheterization in 2015 that showed nonobstructive coronary disease.  I have personally reviewed the patient's nuclear study.  The study was interpreted as inferior ischemia.  However I am not convinced there is ischemia and appears to be a low risk scan.  We will not pursue cardiac catheterization. 2. Paroxysmal atrial fibrillation-patient in sinus rhythm this morning.  Continue metoprolol for rate control if atrial fibrillation recurs.  CHADSvasc 6.  Continue apixaban. 3. Hypertension-blood pressure is controlled.  Continue present medications. 4. Nonobstructive CAD-continue statin.    No aspirin given need for apixaban. 5. COPD-Per primary care.  Patient can be discharged on present meds from a cardiac standpoint.  Follow-up with APP 2 to 4 weeks.  For questions or updates, please contact [facility] Please consult [email or web address] for contact info under        Signed, Olga Millers, MD  03/31/2018, 12:08 PM

## 2018-03-31 NOTE — Progress Notes (Signed)
    Patient presented for Lexiscan nuclear stress test. Tolerated procedure well. Pending final stress imaging result.  [name], AGNP-C 03/31/2018  9:16 AM Pager: [phone]

## 2018-03-31 NOTE — Discharge Instructions (Signed)

## 2018-03-31 NOTE — Progress Notes (Signed)
03/31/2018 3:41 PM Discharge AVS meds taken today and those due this evening reviewed.  Follow-up appointments and when to call md reviewed.  D/C IV and TELE.  Questions and concerns addressed.   D/C home per orders. Kath[facility]ne Hitch

## 2018-03-31 NOTE — Progress Notes (Signed)
Progress Note  Patient Name: [name] Date of Encounter: 03/31/2018  Primary Cardiologist: No primary care provider on file.   Subjective   Pt is seen during stress test. He denies any chest pain or shortness of breath.   Inpatient Medications    Scheduled Meds: . regadenoson      . amLODipine  5 mg Oral Daily  . apixaban  5 mg Oral BID  . atorvastatin  40 mg Oral q1800  . brimonidine  1 drop Both Eyes BID  . finasteride  5 mg Oral Daily  . hydrochlorothiazide  25 mg Oral Daily  . insulin aspart  0-9 Units Subcutaneous TID WC  . levothyroxine  25 mcg Oral QAC breakfast  . lisinopril  40 mg Oral Daily  . metoprolol tartrate  25 mg Oral BID  . mometasone-formoterol  2 puff Inhalation BID  . polyvinyl alcohol  2 drop Both Eyes QID   Continuous Infusions:  PRN Meds: acetaminophen **OR** acetaminophen, albuterol, nitroGLYCERIN, senna-docusate   Vital Signs    Vitals:   03/31/18 0905 03/31/18 0911 03/31/18 0913 03/31/18 0915  BP: 134/61 (!) 153/64 (!) 131/56 (!) 134/56  Pulse:      Resp:      Temp:      TempSrc:      SpO2:      Weight:      Height:       No intake or output data in the 24 hours ending 03/31/18 0916 Filed Weights   03/29/18 1009  Weight: 68 kg    Telemetry    Sinus rhythm while is stress lab  ECG    No new tracings - Personally Reviewed  Physical Exam   GEN: No acute distress.   Neck: No JVD Cardiac: RRR, no murmurs, rubs, or gallops.  Respiratory: Clear to auscultation bilaterally. GI: Soft, nontender, non-distended  MS: No edema; No deformity. Neuro:  Nonfocal  Psych: Normal affect   Labs    Chemistry Recent Labs  Lab 03/29/18 1006  NA 137  K 4.2  CL 105  CO2 25  GLUCOSE 156*  BUN 16  CREATININE 1.07  CALCIUM 9.0  GFRNONAA >60  GFRAA >60  ANIONGAP 7     Hematology Recent Labs  Lab 03/29/18 1006  WBC 6.9  RBC 4.66  HGB 13.6  HCT 41.8  MCV 89.7  MCH 29.2  MCHC 32.5  RDW 13.7  PLT 189     Cardiac Enzymes Recent Labs  Lab 03/29/18 1356 03/29/18 1849 03/30/18 0107  TROPONINI <0.03 <0.03 <0.03    Recent Labs  Lab 03/29/18 1027  TROPIPOC 0.01     BNP Recent Labs  Lab 03/29/18 1121  BNP 95.6     DDimer No results for input(s): DDIMER in the last 168 hours.   Radiology    Dg Chest 2 View  Result Date: 03/29/2018 CLINICAL DATA:  85 year old male with left chest pain for a few days. EXAM: CHEST - 2 VIEW COMPARISON:  02/15/2018 and earlier. FINDINGS: Centrilobular emphysema demonstrated by CT in November. Stable large lung volumes. Stable mild cardiomegaly. Other mediastinal contours are within normal limits. Visualized tracheal air column is within normal limits. Chronic but increased pulmonary interstitial markings in both lungs. No pneumothorax, pleural effusion or confluent pulmonary opacity. Osteopenia. No acute osseous abnormality identified. Negative visible bowel gas pattern. IMPRESSION: Chronic lung disease with emphysema, and mild cardiomegaly. Increased bilateral pulmonary interstitial markings compared to November. Consider acute viral/atypical respiratory infection versus mild or  developing interstitial edema. No pleural effusion. Electronically Signed   By: Odessa FlemingH  Hall M.D.   On: 03/29/2018 10:53    Cardiac Studies   Echo 05/21/2015 Study Conclusions  - Left ventricle: The cavity size was normal. There was mild focal   basal hypertrophy of the septum. Systolic function was vigorous.   The estimated ejection fraction was in the range of 65% to 70%.   Wall motion was normal; there were no regional wall motion   abnormalities. There was an increased relative contribution of   atrial contraction to ventricular filling. Doppler parameters are   consistent with abnormal left ventricular relaxation (grade 1   diastolic dysfunction). Doppler parameters are consistent with   high ventricular filling pressure. - Aortic valve: Trileaflet; mildly thickened,  mildly calcified   leaflets. - Mitral valve: Calcified annulus. Mild diffuse calcification of   the anterior leaflet. - Left atrium: The atrium was mildly dilated. - Pulmonic valve: There was trivial regurgitation.  Patient Profile     85 y.o. male  with a hx of diabetes mellitus, hypertension, paroxysmal atrial fibrillation, prior CVA, who is being seen for the evaluation of chest pain.  Patient had cardiac catheterization January 2015.  At that time he was found to have a 30% LAD, 30% first diagonal, 30% ramus, 40% circumflex, 20% right coronary artery and ejection fraction 55 to 65%.  Echocardiogram February 2017 showed vigorous LV function, grade 1 diastolic dysfunction and mild left atrial enlargement.  Assessment & Plan    1. Chest pain -Atypical. No chest pain overnight. -EKG without ischemic changes -He did have a cardiac catheterization in 2015 that showed nonobstructive coronary disease. -Undergoing lexiscan myoview, results pending  2. PAF -Afib on presentation but maintaining sinus rhythm now. His rate was controlled and we will continue with present dose of metoprolol. CHADSvasc 6.  Continue apixaban.  3. Hypertension -BP well controlled. Continue current meds.   4. Nonobstructive CAD-continue statin.  Discontinue aspirin given need for anticoagulation.  5. COPD-Per primary care.    For questions or updates, please contact [facility] Please consult [email or web address] for contact info under        Signed, Berton BonJanine Layaan Mott, NP  03/31/2018, 9:16 AM

## 2018-03-31 NOTE — Discharge Summary (Signed)
Name: [name] MRN: [id number] DOB: 01/30/1928 85 y.o. [facility]  Date of Admission: 03/29/2018  9:49 AM Date of Discharge:  Attending Physician: Burns SpainButcher, Elizabeth A, MD  Discharge Diagnosis: 1. Chest pain  2. HTN  3. Atrial fibrillation  4. Hypothyroidism   Discharge Medications: Allergies as of 03/31/2018   No Known Allergies     Medication List    STOP taking these medications   pravastatin 40 MG tablet Commonly known as:  PRAVACHOL     TAKE these medications   albuterol 108 (90 Base) MCG/ACT inhaler Commonly known as:  PROVENTIL HFA;VENTOLIN HFA Inhale 2 puffs into the lungs every 4 (four) hours as needed for wheezing or shortness of breath.   amLODipine 5 MG tablet Commonly known as:  NORVASC Take 5 mg by mouth daily.   apixaban 5 MG Tabs tablet Commonly known as:  ELIQUIS Take 1 tablet (5 mg total) by mouth 2 (two) times daily.   atorvastatin 40 MG tablet Commonly known as:  LIPITOR Take 1 tablet (40 mg total) by mouth daily at 6 PM.   brimonidine 0.15 % ophthalmic solution Commonly known as:  ALPHAGAN Place 1 drop into both eyes 2 (two) times daily.   budesonide-formoterol 80-4.5 MCG/ACT inhaler Commonly known as:  SYMBICORT Inhale 2 puffs into the lungs 2 (two) times daily.   Carboxymethylcellulose Sodium 0.25 % Soln Apply 2 drops to eye 4 (four) times daily.   COMBIVENT RESPIMAT 20-100 MCG/ACT Aers respimat Generic drug:  Ipratropium-Albuterol Inhale 1 puff into the lungs every 6 (six) hours as needed for wheezing.   eucerin cream Apply 1 application topically See admin instructions. Apply small amount to affected area twice a day.   finasteride 5 MG tablet Commonly known as:  PROSCAR Take 5 mg by mouth daily.   guaiFENesin 600 MG 12 hr tablet Commonly known as:  MUCINEX Take 2 tablets (1,200 mg total) by mouth 2 (two) times daily. What changed:    when to take this  reasons to take this   hydrochlorothiazide  25 MG tablet Commonly known as:  HYDRODIURIL Take 25 mg by mouth daily.   levalbuterol 0.63 MG/3ML nebulizer solution Commonly known as:  XOPENEX Take 3 mLs (0.63 mg total) by nebulization every 6 (six) hours as needed for wheezing or shortness of breath.   levothyroxine 25 MCG tablet Commonly known as:  SYNTHROID, LEVOTHROID Take 25 mcg by mouth daily before breakfast.   lisinopril 40 MG tablet Commonly known as:  PRINIVIL,ZESTRIL Take 40 mg by mouth daily.   metFORMIN 500 MG tablet Commonly known as:  GLUCOPHAGE Take 500 mg by mouth 2 (two) times daily with a meal.   metoprolol tartrate 50 MG tablet Commonly known as:  LOPRESSOR Take 25 mg by mouth 2 (two) times daily.   multivitamin with minerals tablet Take 1 tablet by mouth daily.   nitroGLYCERIN 0.4 MG SL tablet Commonly known as:  NITROSTAT Place 0.4 mg under the tongue every 5 (five) minutes as needed for chest pain. Dissolve one tablet under the tongue every 5 minutes as needed for chest pain. Repeat every 5 minutes if needed for a total of 3 tablets in 15 minutes. If no relief, call 911.   omeprazole 20 MG capsule Commonly known as:  PRILOSEC Take 20 mg by mouth daily. .   ranitidine 300 MG tablet Commonly known as:  ZANTAC Take 300 mg by mouth at bedtime. Take one tablet by mouth at bedtime in addition  to omeprazole for heartburn/chest pain.   senna-docusate 8.6-50 MG tablet Commonly known as:  Senokot-S Take 1 tablet by mouth at bedtime as needed for mild constipation or moderate constipation.       Disposition and follow-up:   BruceBruce Martinez was discharged from [facility] in Stable condition.  At the hospital follow up visit please address:  1.  Please assess for ongoing chest pain and shortness of breath.  Please assess for compliance with high intensity statin.  2.  Labs / imaging needed at time of follow-up: TSH in 4-6 weeks   3.  Pending labs/ test needing follow-up: None    Follow-up Appointments: Follow-up Information    Jonelle Sidle, MD Follow up.   Specialty:  Cardiology Why:  He will be called to arrange for cardiology follow-up for 2-4 weeks. Contact information: [address] [place] [address] [phone]        [facility].   Why:  Please make a hospital follow up appointment with your primary care doctor within the next 1-2 weeks.  Contact information: [address] [facility] [address] [place] [address] [phone]           Hospital Course by problem list:   1. Chest pain: [name] presented with chest with pain typical features associated with shortness of breath while at rest.  His initial ischemic workup including troponin and EKG were negative for acute signs of ischemia.  Cardiology was consulted given patient's high risk and a Myoview study was ordered which showed intermediate risk.  Cardiology recommended continuing medical management given no indication for heart catheterization at this time.  Patient will follow-up with cardiology in 2 to 4 weeks.  He was switched to a high intensity statin during his admission.  2. HTN: We continued his home anti-hypertensive regimen.   3. Atrial fibrillation: We continued his home metoprolol and apixaban.   4. Hypothyroidism: TSH 5.011 on admission. We did not change current Synthroid given acute illness, but will need repeat TSH in 4-6 weeks.   Discharge Vitals:   BP (!) 151/54   Pulse 64   Temp 98 F (36.7 C) (Oral)   Resp (!) 21   Ht 5\' 7"  (1.702 m)   Wt 68 kg   SpO2 92%   BMI 23.49 kg/m   Pertinent Labs, Studies, and Procedures:   CBC    Component Value Date/Time   WBC 6.9 03/29/2018 1006   RBC 4.66 03/29/2018 1006   HGB 13.6 03/29/2018 1006   HCT 41.8 03/29/2018 1006   PLT 189 03/29/2018 1006   MCV 89.7 03/29/2018 1006   MCH 29.2 03/29/2018 1006   MCHC 32.5 03/29/2018 1006   RDW 13.7 03/29/2018 1006   LYMPHSABS 1.1  02/03/2018 0540   MONOABS 0.7 02/03/2018 0540   EOSABS 0.2 02/03/2018 0540   BASOSABS 0.0 02/03/2018 0540   BMP Latest Ref Rng & Units 03/29/2018 02/03/2018 02/02/2018  Glucose 70 - 99 mg/dL 578(I) 696(E) 952(W)  BUN 8 - 23 mg/dL 16 16 20   Creatinine 0.61 - 1.24 mg/dL 4.13 2.44 0.10(U)  Sodium 135 - 145 mmol/L 137 136 140  Potassium 3.5 - 5.1 mmol/L 4.2 4.1 3.5  Chloride 98 - 111 mmol/L 105 108 106  CO2 22 - 32 mmol/L 25 22 25   Calcium 8.9 - 10.3 mg/dL 9.0 7.2(Z) 8.9   TSH 3.664 BNP 95.6  CXR 03/30/2018: FINDINGS: Centrilobular emphysema demonstrated by CT in November. Stable large lung  volumes. Stable mild cardiomegaly. Other mediastinal contours are within normal limits. Visualized tracheal air column is within normal limits. Chronic but increased pulmonary interstitial markings in both lungs. No pneumothorax, pleural effusion or confluent pulmonary opacity. Osteopenia. No acute osseous abnormality identified. Negative visible bowel gas pattern.  IMPRESSION: Chronic lung disease with emphysema, and mild cardiomegaly. Increased bilateral pulmonary interstitial markings compared to November. Consider acute viral/atypical respiratory infection versus mild or developing interstitial edema. No pleural effusion.  Myoview 03/31/2018: IMPRESSION: 1. Medium size area of mild reversibility involves the inferior septum. The summed difference score is equal to 6. 2. Mild septal hypokinesis. 3. Left ventricular ejection fraction 55% 4. Non invasive risk stratification*: Intermediate   Discharge Instructions: Discharge Instructions    Call MD for:  severe uncontrolled pain   Complete by:  As directed    Diet - low sodium heart healthy   Complete by:  As directed    Diet Carb Modified   Complete by:  As directed    Discharge instructions   Complete by:  As directed    [name],  You were admitted to the hospital due to chest pain. This chest pain was not a heart attack as all of  your blood work was normal. The study they did of your heart showed disease in the arteries of your heart that we knew you had from a prior heart catheterization in 2015.  We have made changes to 1 of your medications as below:   1-please stop taking pravastatin 2-please start taking atorvastatin 40 mg daily.  We have sent a prescription to your pharmacy.  You can continue taking the rest of your medications as usual.  Please make a follow-up appointment with your primary care provider within the next 1 to 2 weeks.  You should also follow-up with a cardiologist in 2 to 4 weeks.  Cardiologist's office will call you to make this follow-up appointment.  Please call us if you have any questions or concerns.  - Dr. Evelene CroonSantos   Increase activity slowly   Complete by:  As directed       Signed: Burna CashSantos-Sanchez, Taliesin Hartlage, MD 03/31/2018, 2:19 PM   Pager: [phone]

## 2018-03-31 NOTE — Progress Notes (Signed)
   Subjective:  Nonadherence overnight.  [name] is doing well this morning and denies chest pain shortness of breath.  Objective:  Vital signs in last 24 hours: Vitals:   03/31/18 0913 03/31/18 0915 03/31/18 1112 03/31/18 1115  BP: (!) 131/56 (!) 134/56 (!) 151/54   Pulse:    64  Resp:      Temp:      TempSrc:      SpO2:      Weight:      Height:        General: Elderly male sleeping in bed in no acute distress CV: IRIR, no mrg  Pulm: CTAB, no wheezes or crackles  Ext: warm and well perfused, no pitting edema   Assessment/Plan:  Principal Problem:   Chest pain Active Problems:   Diabetes mellitus type II, uncontrolled (HCC)   HTN (hypertension)   Atrial fibrillation (HCC)   RBBB  [name] is an 85 year old male with CAD, COPD, remote tobacco use, diabetes, CVA, atrial fibrillation, hypertension, and mild dementia who presents with unstable angina which has now resolved. He is at moderate risk of an acute cardiac ischemic event (heart score of 6, TIMI score 2). Troponins have remained negative x3 and EKG was unremarkable .  Chest pain: Doing well this morning without acute complaints.  We will follow-up ischemic evaluation as below. - Will follow up Lexiscan study,appreciate cardiology recommendations  - Continue aspirin 81 mg daily and atorvastatin 40 mg QD  - Nitroglycerin 0.4 mg sublingual PRN chest pain  COPD: Respiratory status remains stable. - Continue home Dulera 2 puffs BID - Continue Proventil q4h PRN  Type 2 diabetes:  - Continue Sliding scale insulin-sensitive  Hypertension: Continue home regimen as below.  - Continue amlodipine 5 mg + HCTZ 25 mg QD + Lisinopril 40 mg QD + metoprolol tartrate 25 mg QD  Atrial fibrillation: Now on NSR. Will continue home metoprolol and apixaban.   Hypothyroidism: TSH elevated at 5.011. Will not adjust dose in the acute setting, but will need repeat in 4-6 weeks.    Dispo: Anticipated discharge in  approximately today or tomorrow pending Myoview study.   Burna Cash, MD 03/31/2018, 11:50 AM Pager: [phone]

## 2018-04-02 ENCOUNTER — Encounter (HOSPITAL_COMMUNITY): Payer: Self-pay | Admitting: Emergency Medicine

## 2018-04-02 ENCOUNTER — Emergency Department (HOSPITAL_COMMUNITY)
Admission: EM | Admit: 2018-04-02 | Discharge: 2018-04-02 | Disposition: A | Discharge status: Home / Self Care | Payer: Medicare Other | Attending: Emergency Medicine | Admitting: Emergency Medicine

## 2018-04-02 DIAGNOSIS — I10 Essential (primary) hypertension: Secondary | ICD-10-CM | POA: Insufficient documentation

## 2018-04-02 DIAGNOSIS — E860 Dehydration: Secondary | ICD-10-CM | POA: Diagnosis not present

## 2018-04-02 DIAGNOSIS — Z79899 Other long term (current) drug therapy: Secondary | ICD-10-CM | POA: Insufficient documentation

## 2018-04-02 DIAGNOSIS — Z7901 Long term (current) use of anticoagulants: Secondary | ICD-10-CM | POA: Insufficient documentation

## 2018-04-02 DIAGNOSIS — Z87891 Personal history of nicotine dependence: Secondary | ICD-10-CM | POA: Insufficient documentation

## 2018-04-02 DIAGNOSIS — Z794 Long term (current) use of insulin: Secondary | ICD-10-CM | POA: Insufficient documentation

## 2018-04-02 DIAGNOSIS — E039 Hypothyroidism, unspecified: Secondary | ICD-10-CM | POA: Insufficient documentation

## 2018-04-02 DIAGNOSIS — Z8673 Personal history of transient ischemic attack (TIA), and cerebral infarction without residual deficits: Secondary | ICD-10-CM | POA: Diagnosis not present

## 2018-04-02 DIAGNOSIS — J449 Chronic obstructive pulmonary disease, unspecified: Secondary | ICD-10-CM | POA: Diagnosis not present

## 2018-04-02 DIAGNOSIS — R1084 Generalized abdominal pain: Secondary | ICD-10-CM | POA: Diagnosis present

## 2018-04-02 DIAGNOSIS — I2510 Atherosclerotic heart disease of native coronary artery without angina pectoris: Secondary | ICD-10-CM | POA: Diagnosis not present

## 2018-04-02 DIAGNOSIS — K5900 Constipation, unspecified: Secondary | ICD-10-CM

## 2018-04-02 DIAGNOSIS — E119 Type 2 diabetes mellitus without complications: Secondary | ICD-10-CM | POA: Insufficient documentation

## 2018-04-02 LAB — COMPREHENSIVE METABOLIC PANEL
ALT: 16 U/L (ref 0–44)
AST: 28 U/L (ref 15–41)
Albumin: 3.9 g/dL (ref 3.5–5.0)
Alkaline Phosphatase: 42 U/L (ref 38–126)
Anion gap: 10 (ref 5–15)
BUN: 30 mg/dL — ABNORMAL HIGH (ref 8–23)
CO2: 26 mmol/L (ref 22–32)
Calcium: 9.5 mg/dL (ref 8.9–10.3)
Chloride: 103 mmol/L (ref 98–111)
Creatinine, Ser: 1.38 mg/dL — ABNORMAL HIGH (ref 0.61–1.24)
GFR calc Af Amer: 53 mL/min — ABNORMAL LOW (ref >60)
GFR calc non Af Amer: 45 mL/min — ABNORMAL LOW (ref >60)
Glucose, Bld: 140 mg/dL — ABNORMAL HIGH (ref 70–99)
POTASSIUM: 4.6 mmol/L (ref 3.5–5.1)
SODIUM: 139 mmol/L (ref 135–145)
Total Bilirubin: 0.4 mg/dL (ref 0.3–1.2)
Total Protein: 6.4 g/dL — ABNORMAL LOW (ref 6.5–8.1)

## 2018-04-02 LAB — URINALYSIS, ROUTINE W REFLEX MICROSCOPIC
Bilirubin Urine: NEGATIVE
Glucose, UA: NEGATIVE mg/dL
Hgb urine dipstick: NEGATIVE
Ketones, ur: NEGATIVE mg/dL
Leukocytes, UA: NEGATIVE
Nitrite: NEGATIVE
Protein, ur: NEGATIVE mg/dL
Specific Gravity, Urine: 1.014 (ref 1.005–1.030)
pH: 6 (ref 5.0–8.0)

## 2018-04-02 LAB — CBC
HCT: 44.3 % (ref 39.0–52.0)
Hemoglobin: 13.7 g/dL (ref 13.0–17.0)
MCH: 27.7 pg (ref 26.0–34.0)
MCHC: 30.9 g/dL (ref 30.0–36.0)
MCV: 89.7 fL (ref 80.0–100.0)
NRBC: 0 % (ref 0.0–0.2)
PLATELETS: 203 10*3/uL (ref 150–400)
RBC: 4.94 MIL/uL (ref 4.22–5.81)
RDW: 13.7 % (ref 11.5–15.5)
WBC: 6.3 10*3/uL (ref 4.0–10.5)

## 2018-04-02 LAB — LIPASE, BLOOD: Lipase: 39 U/L (ref 11–51)

## 2018-04-02 MED ORDER — MAGNESIUM CITRATE PO SOLN: 1.0000 | Freq: Once | ORAL | 5 refills | Status: AC

## 2018-04-02 MED ORDER — POLYETHYLENE GLYCOL 3350 17 G PO PACK
17.0000 g | PACK | Freq: Every day | ORAL | Status: DC
  Administered 2018-04-02: 17 g via ORAL
  Filled 2018-04-02: qty 1

## 2018-04-02 MED ORDER — LACTATED RINGERS IV BOLUS
1000.0000 mL | Freq: Once | INTRAVENOUS | Status: AC
  Administered 2018-04-02: 1000 mL via INTRAVENOUS

## 2018-04-02 MED ORDER — POLYETHYLENE GLYCOL 3350 17 G PO PACK: 17.0000 g | PACK | Freq: Every day | ORAL | 0 refills | Status: DC

## 2018-04-02 NOTE — Discharge Instructions (Addendum)

## 2018-04-02 NOTE — ED Triage Notes (Signed)
Pt arrives pov with RLQ pain that started 3 days ago pt feels constipated denies any vomiting. Pt recently admitted as chest pain rule out- son states "everything came back ok".

## 2018-04-02 NOTE — ED Provider Notes (Signed)
[facility] EMERGENCY DEPARTMENT Provider Note   CSN: [id number] Arrival date & time: 04/02/18  0747     History   Chief Complaint Chief Complaint  Patient presents with  . Abdominal Pain    HPI [name] is a 85 y.o. male.   Abdominal Pain  Pain location:  Generalized Pain quality: aching and bloating   Pain quality: no pressure   Pain radiates to:  Does not radiate Pain severity:  Moderate Duration:  1 day Timing:  Constant Chronicity:  New Context: not sick contacts and not suspicious food intake   Relieved by:  None tried Worsened by:  Nothing Ineffective treatments:  None tried   Past Medical History:  Diagnosis Date  . Atrial fibrillation (HCC)   . COPD (chronic obstructive pulmonary disease) (HCC)   . CVA (cerebral infarction)   . Diabetes mellitus without complication (HCC)   . Essential hypertension, benign   . Hard of hearing     Patient Active Problem List   Diagnosis Date Noted  . Chest pain 03/29/2018  . Acute respiratory failure with hypoxia (HCC) 02/02/2018  . Atrial fibrillation (HCC) 05/20/2015  . Acute on chronic respiratory failure with hypoxia (HCC) 05/20/2015  . Chest pain syndrome 05/20/2015  . Acute bronchitis 05/20/2015  . AKI (acute kidney injury) (HCC) 05/20/2015  . RBBB 05/20/2015  . Prolonged Q-T interval on ECG 05/20/2015  . CAD (coronary artery disease) 05/20/2015  . History of CVA (cerebrovascular accident) 05/20/2015  . Atrial fibrillation with RVR (HCC)   . CAP (community acquired pneumonia)   . Elevated troponin   . Viral bronchitis 10/29/2014  . Viral gastroenteritis 10/29/2014  . Bronchiectasis with acute exacerbation (HCC)   . Fever 10/28/2014  . Unstable angina (HCC) 03/27/2013  . COPD exacerbation (HCC) 03/25/2013  . Diabetes mellitus type II, uncontrolled (HCC) 03/25/2013  . Hypothyroidism 03/25/2013  . BPH (benign prostatic hyperplasia) 03/25/2013  . HTN (hypertension) 03/25/2013     Past Surgical History:  Procedure Laterality Date  . HERNIA REPAIR    . LEFT HEART CATHETERIZATION WITH CORONARY ANGIOGRAM N/A 03/28/2013   Procedure: LEFT HEART CATHETERIZATION WITH CORONARY ANGIOGRAM;  Surgeon: Peter M Swaziland, MD;  Location: [facility] CATH LAB;  Service: Cardiovascular;  Laterality: N/A;        Home Medications    Prior to Admission medications   Medication Sig Start Date End Date Taking? Authorizing Provider  albuterol (PROVENTIL HFA;VENTOLIN HFA) 108 (90 Base) MCG/ACT inhaler Inhale 2 puffs into the lungs every 4 (four) hours as needed for wheezing or shortness of breath. 05/17/15  Yes Horton, Mayer Masker, MD  amLODipine (NORVASC) 5 MG tablet Take 5 mg by mouth daily.   Yes [provider]  apixaban (ELIQUIS) 5 MG TABS tablet Take 1 tablet (5 mg total) by mouth 2 (two) times daily. 02/03/18  Yes Sheikh, Omair Latif, DO  atorvastatin (LIPITOR) 40 MG tablet Take 1 tablet (40 mg total) by mouth daily at 6 PM. 03/31/18  Yes Santos-Sanchez, Chelsea Primus, MD  brimonidine (ALPHAGAN) 0.15 % ophthalmic solution Place 1 drop into both eyes 2 (two) times daily.    Yes [provider]  budesonide-formoterol (SYMBICORT) 80-4.5 MCG/ACT inhaler Inhale 2 puffs into the lungs 2 (two) times daily.   Yes [provider]  Carboxymethylcellulose Sodium 0.25 % SOLN Apply 2 drops to eye 4 (four) times daily.   Yes [provider]  finasteride (PROSCAR) 5 MG tablet Take 5 mg by mouth daily.   Yes  [provider]  guaiFENesin (MUCINEX) 600 MG 12 hr tablet Take 2 tablets (1,200 mg total) by mouth 2 (two) times daily. Patient taking differently: Take 1,200 mg by mouth 2 (two) times daily as needed for to loosen phlegm.  02/03/18  Yes Sheikh, Omair Latif, DO  hydrochlorothiazide (HYDRODIURIL) 25 MG tablet Take 25 mg by mouth daily.    Yes [provider]  Ipratropium-Albuterol (COMBIVENT RESPIMAT) 20-100 MCG/ACT AERS respimat Inhale 1 puff into the lungs  every 6 (six) hours as needed for wheezing.    Yes [provider]  levalbuterol (XOPENEX) 0.63 MG/3ML nebulizer solution Take 3 mLs (0.63 mg total) by nebulization every 6 (six) hours as needed for wheezing or shortness of breath. 02/03/18  Yes Sheikh, Omair Latif, DO  levothyroxine (SYNTHROID, LEVOTHROID) 25 MCG tablet Take 25 mcg by mouth daily before breakfast.   Yes [provider]  lisinopril (PRINIVIL,ZESTRIL) 40 MG tablet Take 40 mg by mouth daily.   Yes [provider]  metFORMIN (GLUCOPHAGE) 500 MG tablet Take 500 mg by mouth 2 (two) times daily with a meal.    Yes [provider]  metoprolol (LOPRESSOR) 50 MG tablet Take 25 mg by mouth 2 (two) times daily.   Yes [provider]  Multiple Vitamins-Minerals (MULTIVITAMIN WITH MINERALS) tablet Take 1 tablet by mouth daily.   Yes [provider]  nitroGLYCERIN (NITROSTAT) 0.4 MG SL tablet Place 0.4 mg under the tongue every 5 (five) minutes as needed for chest pain. Dissolve one tablet under the tongue every 5 minutes as needed for chest pain. Repeat every 5 minutes if needed for a total of 3 tablets in 15 minutes. If no relief, call 911.   Yes [provider]  omeprazole (PRILOSEC) 20 MG capsule Take 20 mg by mouth daily. .   Yes [provider]  ranitidine (ZANTAC) 300 MG tablet Take 300 mg by mouth at bedtime. Take one tablet by mouth at bedtime in addition to omeprazole for heartburn/chest pain.   Yes [provider]  senna-docusate (SENOKOT-S) 8.6-50 MG tablet Take 1 tablet by mouth at bedtime as needed for mild constipation or moderate constipation. 02/15/18  Yes Long, Arlyss RepressJoshua G, MD  Skin Protectants, Misc. (EUCERIN) cream Apply 1 application topically See admin instructions. Apply small amount to affected area twice a day.   Yes [provider]  magnesium citrate SOLN Take 296 mLs (1 Bottle total) by mouth once for 1 dose. 04/02/18 04/02/18  Courtney Bellizzi, Barbara CowerJason,  MD  polyethylene glycol (MIRALAX / GLYCOLAX) packet Take 17 g by mouth daily. 04/02/18   Jeven Topper, Barbara CowerJason, MD    Family History Family History  Problem Relation Age of Onset  . CAD Son     Social History Social History   Tobacco Use  . Smoking status: Former Smoker    Types: Cigarettes  . Smokeless tobacco: Former Engineer, waterUser  Substance Use Topics  . Alcohol use: No  . Drug use: No     Allergies   Patient has no known allergies.   Review of Systems Review of Systems  Gastrointestinal: Positive for abdominal pain.  All other systems reviewed and are negative.    Physical Exam Updated Vital Signs BP (!) 155/78 (BP Location: Right Arm)   Pulse (!) 58   Temp 98 F (36.7 C) (Oral)   Resp 16   SpO2 97%   Physical Exam Vitals signs and nursing note reviewed.  Constitutional:      Appearance: He is well-developed.  HENT:     Head: Normocephalic and atraumatic.     Nose: Nose normal. No congestion or rhinorrhea.     Mouth/Throat:     Mouth: Mucous membranes are dry.  Eyes:     Comments: sunken  Neck:     Musculoskeletal: Normal range of motion.  Cardiovascular:     Rate and Rhythm: Normal rate.  Pulmonary:     Effort: Pulmonary effort is normal. No respiratory distress.  Abdominal:     General: Bowel sounds are normal. There is no distension.     Tenderness: There is generalized abdominal tenderness.  Musculoskeletal: Normal range of motion.        General: No swelling.  Neurological:     General: No focal deficit present.     Mental Status: He is alert.      ED Treatments / Results  Labs (all labs ordered are listed, but only abnormal results are displayed) Labs Reviewed  COMPREHENSIVE METABOLIC PANEL - Abnormal; Notable for the following components:      Result Value   Glucose, Bld 140 (*)    BUN 30 (*)    Creatinine, Ser 1.38 (*)    Total Protein 6.4 (*)    GFR calc non Af Amer 45 (*)    GFR calc Af Amer 53 (*)    All other components within normal  limits  LIPASE, BLOOD  CBC  URINALYSIS, ROUTINE W REFLEX MICROSCOPIC    EKG None  Radiology No results found.  Procedures Procedures (including critical care time)  Medications Ordered in ED Medications  lactated ringers bolus 1,000 mL (0 mLs Intravenous Stopped 04/02/18 1020)  lactated ringers bolus 1,000 mL (0 mLs Intravenous Stopped 04/02/18 1138)     Initial Impression / Assessment and Plan / ED Course  I have reviewed the triage vital signs and the nursing notes.  Pertinent labs & imaging results that were available during my care of the patient were reviewed by me and considered in my medical decision making (see chart for details).  Suspect dehydration is the cause for his hard stools. Abdomen is benign. Nurse note states he has RLQ pain however on my exam he definitely does not. Workup negative aside from mild AKI. Have given 2 liters of fluids and miralax here with directions for bowel cleanout regimen at home. Stable for dc at this time.   Final Clinical Impressions(s) / ED Diagnoses   Final diagnoses:  Constipation, unspecified constipation type  Dehydration    ED Discharge Orders         Ordered    polyethylene glycol (MIRALAX / GLYCOLAX) packet  Daily     04/02/18 1234    magnesium citrate SOLN   Once     04/02/18 1234           Vana Arif, Barbara CowerJason, MD 04/02/18 1543

## 2018-04-02 NOTE — ED Notes (Signed)
Patient verbalizes understanding of discharge instructions. Opportunity for questioning and answers were provided. Armband removed by staff, pt discharged from ED.  

## 2018-04-08 ENCOUNTER — Telehealth: Payer: Self-pay

## 2018-04-08 NOTE — Telephone Encounter (Signed)
Per Lizabeth Leyden, scheduled patient for hospital follow-up 05/03/2018. His son can only bring him on Fridays and that is the first availability.   They have no questions about medications since discharge.  They were grateful for assistance.

## 2018-04-08 NOTE — Telephone Encounter (Signed)
-----   Message from Berton Bon, NP sent at 03/31/2018  1:29 PM EST ----- Regarding: Needs hosp F/U Please schedule this patient for a follow-up appointment and call them with that information.  Primary Cardiologist: Dr. Diona Browner is on file but pt lives in [place] and would like to be seen here. He has only been seen in hospital, never in office.  Date of Discharge: 03/31/2018 Appointment Needed Within: 2-4 weeks with an APP Appointment Type: Hospital follow up chest pain  Thank you! Berton Bon, AGNP-C 03/31/2018  1:41 PM Pager: [phone]

## 2018-04-16 ENCOUNTER — Encounter: Payer: Self-pay | Admitting: Cardiology

## 2018-05-03 ENCOUNTER — Ambulatory Visit (INDEPENDENT_AMBULATORY_CARE_PROVIDER_SITE_OTHER): Payer: Medicare Other | Admitting: Cardiology

## 2018-05-03 ENCOUNTER — Encounter (INDEPENDENT_AMBULATORY_CARE_PROVIDER_SITE_OTHER): Payer: Self-pay

## 2018-05-03 ENCOUNTER — Encounter: Payer: Self-pay | Admitting: Cardiology

## 2018-05-03 VITALS — BP 138/66 | HR 58 | Ht 68.0 in | Wt 151.4 lb

## 2018-05-03 DIAGNOSIS — I480 Paroxysmal atrial fibrillation: Secondary | ICD-10-CM

## 2018-05-03 DIAGNOSIS — I10 Essential (primary) hypertension: Secondary | ICD-10-CM | POA: Diagnosis not present

## 2018-05-03 DIAGNOSIS — I2510 Atherosclerotic heart disease of native coronary artery without angina pectoris: Secondary | ICD-10-CM

## 2018-05-03 NOTE — Patient Instructions (Signed)
Medication Instructions:  Your physician recommends that you continue on your current medications as directed. Please refer to the Current Medication list given to you today. If you need a refill on your cardiac medications before your next appointment, please call your pharmacy.   Lab work: None  If you have labs (blood work) drawn today and your tests are completely normal, you will receive your results only by: . MyChart Message (if you have MyChart) OR . A paper copy in the mail If you have any lab test that is abnormal or we need to change your treatment, we will call you to review the results.  Testing/Procedures: None   Follow-Up: At [facility], you and your health needs are our priority.  As part of our continuing mission to provide you with exceptional heart care, we have created designated Provider Care Teams.  These Care Teams include your primary Cardiologist (physician) and Advanced Practice Providers (APPs -  Physician Assistants and Nurse Practitioners) who all work together to provide you with the care you need, when you need it. You will need a follow up appointment in 3-4 months.  Please call our office 2 months in advance to schedule this appointment.  You may see Brian Crenshaw, MD or one of the following Advanced Practice Providers on your designated Care Team:   Luke Kilroy, PA-C Krista Kroeger, PA-C . Callie Goodrich, PA-C  Any Other Special Instructions Will Be Listed Below (If Applicable).   

## 2018-05-03 NOTE — Progress Notes (Signed)
Cardiology Office Note:    Date:  05/03/2018   ID:  [name], DOB 01/19/1928, MRN [id number]  [facility]  Cardiologist:  Olga MillersBrian Crenshaw, MD  Referring MD: [facility]   Chief Complaint  Patient presents with  . Hospitalization Follow-up    Chest pain    History of Present Illness:    [name] is a 85 y.o. male with a past medical history significant for diabetes mellitus, hypertension, paroxysmal atrial fibrillation, prior CVA. [facility]. Patient had cardiac catheterization January 2015.  At that time he was found to have a 30% LAD, 30% first diagonal, 30% ramus, 40% circumflex, 20% right coronary artery and ejection fraction 55 to 65%.  Echocardiogram February 2017 showed vigorous LV function, grade 1 diastolic dysfunction and mild left atrial enlargement.  He was admitted to the hospital 03/29/2018 with chest pain.  He was seen by Dr. Jens Somrenshaw in consultation.  His chest pain symptoms were felt to be atypical and enzymes were negative.  No ST changes on EKG.  He underwent nuclear stress test which was interpreted as inferior ischemia, however Dr. Jens Somrenshaw was not convinced of ischemia and felt the study was low risk.  No cardiac catheterization was pursued.  The patient initially presented with atrial fibrillation but at discharge was maintaining sinus rhythm on metoprolol for rate control. CHADSvasc score is 6.He is anticoagulated with apixaban.  The patient is here today for hospital follow-up with his daughter. He has had no further chest pain. The patient is not very talkative and seems to be hard of hearing.  He has a flat affect.  His daughter often has to repeat my questions to him to get him to answer.  [place]   His daughter says that he is usually quite active doing things in the yard but has not been very active this winter. He piddles around the house. He  does not usually sit for too long. He admits to shortness of breath with more exertion and has to stop and rest. He has no orthopnea or PND.  No palpitations or lightheadedness.  He has no edema.   He is a remote smoker, none in >20 years. He does not drink alcohol. No leg pain with walking.   [facility] but his daughter states that she wants to establish more regular care in [facility] [place] as [facility] once a [facility].  She is also planning to have him establish primary care with a [place] primary care provider.  Past Medical History:  Diagnosis Date  . Atrial fibrillation (HCC)   . COPD (chronic obstructive pulmonary disease) (HCC)   . CVA (cerebral infarction)   . Diabetes mellitus without complication (HCC)   . Essential hypertension, benign   . Hard of hearing     Past Surgical History:  Procedure Laterality Date  . HERNIA REPAIR    . LEFT HEART CATHETERIZATION WITH CORONARY ANGIOGRAM N/A 03/28/2013   Procedure: LEFT HEART CATHETERIZATION WITH CORONARY ANGIOGRAM;  Surgeon: Peter M SwazilandJordan, MD;  [facility];  Service: Cardiovascular;  Laterality: N/A;    Current Medications: No outpatient medications have been marked as taking for the 05/03/18 encounter (Office Visit) with Berton BonHammond, Shakya Sebring, NP.     Allergies:   Patient has no known allergies.   Social History  Socioeconomic History  . Marital status: Married    Spouse name: Not on file  . Number of children: Not on file  . Years of education: Not on file  . Highest education level: Not on file  Occupational History  . Not on file  Social Needs  . Financial resource strain: Not on file  . Food insecurity:    Worry: Not on file    Inability: Not on file  . Transportation needs:    Medical: Not on file    Non-medical: Not on file  Tobacco Use  . Smoking status: Former Smoker    Types: Cigarettes  . Smokeless tobacco: Former Engineer, waterUser  Substance and Sexual Activity    . Alcohol use: No  . Drug use: No  . Sexual activity: Not on file  Lifestyle  . Physical activity:    Days per week: Not on file    Minutes per session: Not on file  . Stress: Not on file  Relationships  . Social connections:    Talks on phone: Not on file    Gets together: Not on file    Attends religious service: Not on file    Active member of club or organization: Not on file    Attends meetings of clubs or organizations: Not on file    Relationship status: Not on file  Other Topics Concern  . Not on file  Social History Narrative  . Not on file     Family History: The patient's family history includes CAD in his son. ROS:   Please see the history of present illness.     All other systems reviewed and are negative.  EKGs/Labs/Other Studies Reviewed:    The following studies were reviewed today:  Echocardiogram 05/21/15 Study Conclusions  - Left ventricle: The cavity size was normal. There was mild focal   basal hypertrophy of the septum. Systolic function was vigorous.   The estimated ejection fraction was in the range of 65% to 70%.   Wall motion was normal; there were no regional wall motion   abnormalities. There was an increased relative contribution of   atrial contraction to ventricular filling. Doppler parameters are   consistent with abnormal left ventricular relaxation (grade 1   diastolic dysfunction). Doppler parameters are consistent with   high ventricular filling pressure. - Aortic valve: Trileaflet; mildly thickened, mildly calcified   leaflets. - Mitral valve: Calcified annulus. Mild diffuse calcification of   the anterior leaflet. - Left atrium: The atrium was mildly dilated. - Pulmonic valve: There was trivial regurgitation.  EKG:  EKG is not ordered today.    Recent Labs: 02/03/2018: Magnesium 1.6 03/29/2018: B Natriuretic Peptide 95.6 03/30/2018: TSH 5.011 04/02/2018: ALT 16; BUN 30; Creatinine, Ser 1.38; Hemoglobin 13.7; Platelets 203;  Potassium 4.6; Sodium 139   Recent Lipid Panel    Component Value Date/Time   CHOL 131 05/21/2015 0458   TRIG 123 05/21/2015 0458   HDL 35 (L) 05/21/2015 0458   CHOLHDL 3.7 05/21/2015 0458   VLDL 25 05/21/2015 0458   LDLCALC 71 05/21/2015 0458    Physical Exam:    VS:  BP 138/66   Pulse (!) 58   Ht 5\' 8"  (1.727 m)   Wt 151 lb 6.4 oz (68.7 kg)   SpO2 92%   BMI 23.02 kg/m     Wt Readings from Last 3 Encounters:  05/03/18 151 lb 6.4 oz (68.7 kg)  03/29/18 150 lb (68 kg)  02/15/18 137 lb (62.1 kg)  Physical Exam  Constitutional: He is oriented to person, place, and time. No distress.  Frail, elderly male  HENT:  Head: Normocephalic and atraumatic.  Reddened skin around the eyes  Neck: Normal range of motion. Neck supple. No JVD present.  Cardiovascular: Normal rate, regular rhythm, normal heart sounds and intact distal pulses. Exam reveals no gallop and no friction rub.  No murmur heard. Pulmonary/Chest: Effort normal and breath sounds normal. No respiratory distress. He has no wheezes. He has no rales.  Abdominal: Soft. Bowel sounds are normal.  Musculoskeletal:        General: No edema.  Neurological: He is alert and oriented to person, place, and time.  Skin: Skin is warm and dry.  Psychiatric:  Flat affect  Vitals reviewed.   ASSESSMENT:    1. Coronary artery disease involving native coronary artery of native heart without angina pectoris   2. Paroxysmal atrial fibrillation (HCC)   3. Essential hypertension    PLAN:    In order of problems listed above:  CAD -Cardiac cath in 2015 showed nonobstructive CAD. -Patient recently evaluated for chest pain at the hospital and underwent Lexiscan Myoview that was deemed low risk. -No further chest pain.  He has some mild dyspnea on more exertion which does not seem to be very significant. -Continue beta-blocker and statin.  Patient not on aspirin due to need for anticoagulation.  Paroxysmal atrial  fibrillation -Maintaining sinus rhythm.  On metoprolol for rate control in case of recurrence. -CHADSvasc 6.Continue apixaban. No unusual bleeding.   Hypertension -BP well controlled continue current medications  Hyperlipidemia -On atorvastatin 40 mg. -Checked at [place]. Pt plans to establish primary care in [place].    Medication Adjustments/Labs and Tests Ordered: Current medicines are reviewed at length with the patient today.  Concerns regarding medicines are outlined above. Labs and tests ordered and medication changes are outlined in the patient instructions below:  Patient Instructions  Medication Instructions:  Your physician recommends that you continue on your current medications as directed. Please refer to the Current Medication list given to you today.  If you need a refill on your cardiac medications before your next appointment, please call your pharmacy.   Lab work: None  If you have labs (blood work) drawn today and your tests are completely normal, you will receive your results only by: Marland Kitchen MyChart Message (if you have MyChart) OR . A paper copy in the mail If you have any lab test that is abnormal or we need to change your treatment, we will call you to review the results.  Testing/Procedures: None  Follow-Up: At [facility], you and your health needs are our priority.  As part of our continuing mission to provide you with exceptional heart care, we have created designated Provider Care Teams.  These Care Teams include your primary Cardiologist (physician) and Advanced Practice Providers (APPs -  Physician Assistants and Nurse Practitioners) who all work together to provide you with the care you need, when you need it. You will need a follow up appointment in 3-4 months.  Please call our office 2 months in advance to schedule this appointment.  You may see Olga Millers, MD or one of the following Advanced Practice Providers on your designated Care Team:   Corine Shelter, PA-C Judy Pimple, [place] . Marjie Skiff, PA-C  Any Other Special Instructions Will Be Listed Below (If Applicable).       Signed, Berton Bon, NP  05/03/2018 2:19 PM    [facility]

## 2018-06-03 ENCOUNTER — Encounter: Payer: Self-pay | Admitting: Family Medicine

## 2018-06-05 ENCOUNTER — Other Ambulatory Visit: Payer: Self-pay

## 2018-06-05 ENCOUNTER — Encounter: Payer: Self-pay | Admitting: Family Medicine

## 2018-06-05 ENCOUNTER — Ambulatory Visit (INDEPENDENT_AMBULATORY_CARE_PROVIDER_SITE_OTHER): Payer: Medicare Other | Admitting: Family Medicine

## 2018-06-05 ENCOUNTER — Telehealth: Payer: Self-pay | Admitting: Family Medicine

## 2018-06-05 ENCOUNTER — Ambulatory Visit (INDEPENDENT_AMBULATORY_CARE_PROVIDER_SITE_OTHER): Payer: Medicare Other

## 2018-06-05 VITALS — BP 159/56 | HR 55 | Temp 98.0°F | Resp 17 | Ht 66.0 in | Wt 148.6 lb

## 2018-06-05 DIAGNOSIS — E039 Hypothyroidism, unspecified: Secondary | ICD-10-CM

## 2018-06-05 DIAGNOSIS — J181 Lobar pneumonia, unspecified organism: Secondary | ICD-10-CM

## 2018-06-05 DIAGNOSIS — J441 Chronic obstructive pulmonary disease with (acute) exacerbation: Secondary | ICD-10-CM

## 2018-06-05 DIAGNOSIS — I10 Essential (primary) hypertension: Secondary | ICD-10-CM | POA: Diagnosis not present

## 2018-06-05 DIAGNOSIS — R059 Cough, unspecified: Secondary | ICD-10-CM

## 2018-06-05 DIAGNOSIS — Z9981 Dependence on supplemental oxygen: Secondary | ICD-10-CM | POA: Insufficient documentation

## 2018-06-05 DIAGNOSIS — R0902 Hypoxemia: Secondary | ICD-10-CM

## 2018-06-05 DIAGNOSIS — H5713 Ocular pain, bilateral: Secondary | ICD-10-CM

## 2018-06-05 DIAGNOSIS — E1159 Type 2 diabetes mellitus with other circulatory complications: Secondary | ICD-10-CM

## 2018-06-05 DIAGNOSIS — E1165 Type 2 diabetes mellitus with hyperglycemia: Secondary | ICD-10-CM | POA: Diagnosis not present

## 2018-06-05 DIAGNOSIS — R05 Cough: Secondary | ICD-10-CM

## 2018-06-05 DIAGNOSIS — J189 Pneumonia, unspecified organism: Secondary | ICD-10-CM

## 2018-06-05 DIAGNOSIS — I2510 Atherosclerotic heart disease of native coronary artery without angina pectoris: Secondary | ICD-10-CM | POA: Diagnosis not present

## 2018-06-05 DIAGNOSIS — R062 Wheezing: Secondary | ICD-10-CM

## 2018-06-05 DIAGNOSIS — Z7689 Persons encountering health services in other specified circumstances: Secondary | ICD-10-CM

## 2018-06-05 LAB — GLUCOSE, POCT (MANUAL RESULT ENTRY): POC GLUCOSE: 170 mg/dL — AB (ref 70–99)

## 2018-06-05 MED ORDER — BENZONATATE 100 MG PO CAPS: 100.0000 mg | ORAL_CAPSULE | Freq: Three times a day (TID) | ORAL | 1 refills | Status: DC | PRN

## 2018-06-05 MED ORDER — SENNOSIDES-DOCUSATE SODIUM 8.6-50 MG PO TABS: 2.0000 | ORAL_TABLET | Freq: Every day | ORAL | 3 refills | Status: DC

## 2018-06-05 MED ORDER — OLOPATADINE HCL 0.1 % OP SOLN: 1.0000 [drp] | Freq: Two times a day (BID) | OPHTHALMIC | 12 refills | Status: DC

## 2018-06-05 MED ORDER — PREDNISONE 20 MG PO TABS: 20.0000 mg | ORAL_TABLET | Freq: Every day | ORAL | 0 refills | Status: AC

## 2018-06-05 MED ORDER — LEVOFLOXACIN 500 MG PO TABS: 500.0000 mg | ORAL_TABLET | Freq: Every day | ORAL | 0 refills | Status: DC

## 2018-06-05 NOTE — Progress Notes (Signed)
[name], is a 85 y.o. male  ZOX:[id number]  WJX:[id number]  DOB - 03-06-1928  CC:  Chief Complaint  Patient presents with  . Establish Care  . Diabetes    CBG= 170  . Constipation       HPI: [name] is a 85 y.o. male is here today to establish primary care and evaluation of diabetes, constipation, cough and SOB.   [name] is accompanied by his daughter is his primary caregiver.  Patient is also followed at the [facility] in [place], [place]  for primary care. However is only seen annually and is unable to be seen if needed for acute complaints.  Daughter also is requesting to have all of his specialties within the [facility] health system.  He has already established with cardiology and was seen recently.  Patient is extremely hard of hearing and daughter is helping facilitate visit providing most of HPI.  [name] has COPD exacerbation (HCC); Diabetes mellitus type II, uncontrolled (HCC); Hypothyroidism; BPH (benign prostatic hyperplasia); HTN (hypertension); Unstable angina (HCC); Bronchiectasis with acute exacerbation (HCC); Atrial fibrillation (HCC); RBBB; Prolonged Q-T interval on ECG; CAD (coronary artery disease); History of CVA (cerebrovascular accident); Atrial fibrillation with RVR (HCC); and Chest pain on their problem list.   Diabetes  Unknown of last A1c.  Does not routinely check blood sugar at home.  Diabetes has been controlled for an extended period of time on metformin twice daily which patient is receives from the [place]. Patient is occasionally active of exercise when tolerated due to conditions.  Denies 3 P's or known hypoglycemia.  COPD Exacerbation  Daughter complains of patient experiencing a nonproductive cough with chest congestion audible over the last week and a half.  This cough worsens at night to the point which it makes it difficult for him to catch his breath.  He also has had worsening wheezing.  When asked if chest tightness patient denies.  Patient was  recently hospitalized for pneumonia back in November and has chest x-ray in January which represented viral/atypical respiratory infection versus interstitial edema.  He has not had a follow-up x-ray since this time.  Daughter is uncertain if patient has received a pneumonia vaccine.  Today I do not have patient's previous medical records from the [place] clinic he decided to hold off on a pneumonia vaccine until that information has been reviewed.  Constipation  Patient complains of constipation. Skips days of having a bowel movement. Seen at the ER 04/02/18 with a complaint constipation. He was given magnesium citrate and Miralx and successfully had a bowel movement. Constipation has started again. Patient doesn't routinely drink water. He is active only sometime when feels like walking. Not taking Miralax consistently.    Bilateral eye irritation  Patient followed by opthalmology at the [facility]. daughter reports that patient has glaucoma and the ophthalmologist has scheduled an upcoming eye surgery.  Patient is constantly complaining of irritation of the eye and formation of film and grit over his eyes.  He is prescribed to optical solutions by his ophthalmologist.  He also reports he is been washing his eyes out with baby shampoo.  According to daughter the ophthalmologist has recommended this course to relieve irritation.  His eyes are persistently red.  Endorses no worsening of his visual acuity.  Thinks his ophthalmology follow-up is next month.  Current medications: Current Outpatient Medications:  .  albuterol (PROVENTIL HFA;VENTOLIN HFA) 108 (90 Base) MCG/ACT inhaler, Inhale 2 puffs into the lungs every 4 (four) hours as needed for wheezing  or shortness of breath., Disp: 1 Inhaler, Rfl: 0 .  amLODipine (NORVASC) 5 MG tablet, Take 5 mg by mouth daily., Disp: , Rfl:  .  apixaban (ELIQUIS) 5 MG TABS tablet, Take 1 tablet (5 mg total) by mouth 2 (two) times daily., Disp: 60 tablet, Rfl: 0 .  atorvastatin  (LIPITOR) 40 MG tablet, Take 1 tablet (40 mg total) by mouth daily at 6 PM., Disp: 30 tablet, Rfl: 0 .  brimonidine (ALPHAGAN) 0.15 % ophthalmic solution, Place 1 drop into both eyes 2 (two) times daily. , Disp: , Rfl:  .  budesonide-formoterol (SYMBICORT) 80-4.5 MCG/ACT inhaler, Inhale 2 puffs into the lungs 2 (two) times daily., Disp: , Rfl:  .  Carboxymethylcellulose Sodium 0.25 % SOLN, Apply 2 drops to eye 4 (four) times daily., Disp: , Rfl:  .  finasteride (PROSCAR) 5 MG tablet, Take 5 mg by mouth daily., Disp: , Rfl:  .  hydrochlorothiazide (HYDRODIURIL) 25 MG tablet, Take 25 mg by mouth daily. , Disp: , Rfl:  .  Ipratropium-Albuterol (COMBIVENT RESPIMAT) 20-100 MCG/ACT AERS respimat, Inhale 1 puff into the lungs every 6 (six) hours as needed for wheezing. , Disp: , Rfl:  .  levalbuterol (XOPENEX) 0.63 MG/3ML nebulizer solution, Take 3 mLs (0.63 mg total) by nebulization every 6 (six) hours as needed for wheezing or shortness of breath., Disp: 3 mL, Rfl: 12 .  levothyroxine (SYNTHROID, LEVOTHROID) 25 MCG tablet, Take 25 mcg by mouth daily before breakfast., Disp: , Rfl:  .  lisinopril (PRINIVIL,ZESTRIL) 40 MG tablet, Take 40 mg by mouth daily., Disp: , Rfl:  .  metFORMIN (GLUCOPHAGE) 500 MG tablet, Take 500 mg by mouth 2 (two) times daily with a meal. , Disp: , Rfl:  .  metoprolol (LOPRESSOR) 50 MG tablet, Take 25 mg by mouth 2 (two) times daily., Disp: , Rfl:  .  Multiple Vitamins-Minerals (MULTIVITAMIN WITH MINERALS) tablet, Take 1 tablet by mouth daily., Disp: , Rfl:  .  omeprazole (PRILOSEC) 20 MG capsule, Take 20 mg by mouth daily. ., Disp: , Rfl:  .  ranitidine (ZANTAC) 300 MG tablet, Take 300 mg by mouth at bedtime. Take one tablet by mouth at bedtime in addition to omeprazole for heartburn/chest pain., Disp: , Rfl:  .  Skin Protectants, Misc. (EUCERIN) cream, Apply 1 application topically See admin instructions. Apply small amount to affected area twice a day., Disp: , Rfl:  .   nitroGLYCERIN (NITROSTAT) 0.4 MG SL tablet, Place 0.4 mg under the tongue every 5 (five) minutes as needed for chest pain. Dissolve one tablet under the tongue every 5 minutes as needed for chest pain. Repeat every 5 minutes if needed for a total of 3 tablets in 15 minutes. If no relief, call 911., Disp: , Rfl:    Pertinent family medical history: family history includes CAD in his son; Skin cancer in his brother.   No Known Allergies  Social History   Socioeconomic History  . Marital status: Married    Spouse name: Not on file  . Number of children: Not on file  . Years of education: Not on file  . Highest education level: Not on file  Occupational History  . Not on file  Social Needs  . Financial resource strain: Not on file  . Food insecurity:    Worry: Not on file    Inability: Not on file  . Transportation needs:    Medical: Not on file    Non-medical: Not on file  Tobacco Use  .  Smoking status: Former Smoker    Types: Cigarettes  . Smokeless tobacco: Former Engineer, water and Sexual Activity  . Alcohol use: No  . Drug use: No  . Sexual activity: Not on file  Lifestyle  . Physical activity:    Days per week: Not on file    Minutes per session: Not on file  . Stress: Not on file  Relationships  . Social connections:    Talks on phone: Not on file    Gets together: Not on file    Attends religious service: Not on file    Active member of club or organization: Not on file    Attends meetings of clubs or organizations: Not on file    Relationship status: Not on file  . Intimate partner violence:    Fear of current or ex partner: Not on file    Emotionally abused: Not on file    Physically abused: Not on file    Forced sexual activity: Not on file  Other Topics Concern  . Not on file  Social History Narrative  . Not on file    Review of Systems: Pertinent negatives listed in HPI Objective:   Vitals:   06/05/18 1009  BP: (!) 159/56  Pulse: (!) 55  Resp:  17  Temp: 98 F (36.7 C)  SpO2: 94%    BP Readings from Last 3 Encounters:  06/05/18 (!) 159/56  05/03/18 138/66  04/02/18 (!) 155/78    Filed Weights   06/05/18 1009  Weight: 148 lb 9.6 oz (67.4 kg)      Physical Exam: Constitutional: Patient appears well-developed and well-nourished. No distress. HENT: Normocephalic, atraumatic, External right and left ear normal. .  Eyes: Conjunctivae and EOM are normal. PERRLA, no scleral icterus. Neck: Normal ROM. Neck supple. No JVD. No tracheal deviation. No thyromegaly. CVS: RRR, S1/S2 +, no murmurs, no gallops, no carotid bruit.  Pulmonary: Audible wheeze. Breath sound are barely audible. Rhonchi present.  Abdominal: Soft. BS +, no distension, tenderness, rebound or guarding.  Neuro: Alert. Normal muscle tone coordination. Gait stable. Skin: Skin is warm and dry. No rash noted. Not diaphoretic. No erythema. No pallor. Psychiatric: Normal mood and affect. Behavior, judgment, thought content normal.  Lab Results (prior encounters)  Lab Results  Component Value Date   WBC 6.3 04/02/2018   HGB 13.7 04/02/2018   HCT 44.3 04/02/2018   MCV 89.7 04/02/2018   PLT 203 04/02/2018   Lab Results  Component Value Date   CREATININE 1.38 (H) 04/02/2018   BUN 30 (H) 04/02/2018   NA 139 04/02/2018   K 4.6 04/02/2018   CL 103 04/02/2018   CO2 26 04/02/2018    Lab Results  Component Value Date   HGBA1C 7.5 (H) 05/20/2015       Component Value Date/Time   CHOL 131 05/21/2015 0458   TRIG 123 05/21/2015 0458   HDL 35 (L) 05/21/2015 0458   CHOLHDL 3.7 05/21/2015 0458   VLDL 25 05/21/2015 0458   LDLCALC 71 05/21/2015 0458        Assessment and plan:  1. Encounter to establish care 2. Type 2 diabetes mellitus with other circulatory complication, without long-term current use of insulin (HCC)-no changes today. Will wait for updated labs today and receipt of medical records from the [place]. Checking the following:  - Glucose (CBG) -  Comprehensive metabolic panel - Hemoglobin A1c  3. Essential hypertension Prior readings at cardiology were controlled.  No changes today. Checking  Comprehensive metabolic panel  4. COPD exacerbation (HCC) 5. Wheezing 6. Cough 7. Hypoxemia, PRN 2 L oxygen at home, continue  Concern for pneumonia today will obtain a two-view chest image.  Daughter who accompanies patient is uncertain of when patient has last seen pulmonology.  He does not require refills of his medications today.  [facility] to establish for management of COPD. Given a short course of prednisone to help improve breathing.  Prednisone 20 mg x 4 days.  Lower dose given diabetes and I'm uncertain of the degree in which his diabetes is controlled. - DG Chest 2 View; Future - Ambulatory referral to Pulmonology   8. Eye pain, bilateral -Continue medications prescribed by ophthalmology will add Patanol twice daily as needed for eye irritation and itching. -The need to follow-up with ophthalmology regarding management of glaucoma.  9. Hypothyroidism, unspecified type Checking  TSH  10. Pneumonia of left upper lobe due to infectious organism (HCC) Treated empirically with Levaquin 500 mg once daily x7 days.  Patient will return to office in 7 days for follow-up evaluation.  Family was advised if his symptoms worsen or if he becomes confused to go immediately to the ER as these are signs of worsening infection.   Dg Chest 2 View  Result Date: 06/05/2018 CLINICAL DATA:  COPD with persistent cough and wheezing EXAM: CHEST - 2 VIEW COMPARISON:  03/29/2018 FINDINGS: Hazy opacity over the left upper lobe which could be soft tissue attenuation or pneumonia. This is new from prior, inconsistent with pulmonary nodule. COPD with hyperinflation and interstitial coarsening. Normal heart size. IMPRESSION: 1. Artifact versus pneumonia over the left upper lobe. 2. COPD. Electronically Signed   By: Marnee SpringJonathon   Watts M.D.   On: 06/05/2018 11:20    3 months chronic conditions and 1 week pneumonia follow-up.  The patient was given clear instructions to go to ER or return to medical center if symptoms don't improve, worsen or new problems develop. The patient verbalized understanding. The patient was advised  to call and obtain lab results if they haven't heard anything from out office within 7-10 business days.  Joaquin CourtsKimberly Janalynn Eder, FNP [facility] [address], [place] [phone]: [phone]    This note has been created with Dragon speech recognition software and Paediatric nursesmart phrase technology. Any transcriptional errors are unintentional.

## 2018-06-05 NOTE — Telephone Encounter (Signed)
Contact patient's daughter to advise that recent chest x-ray indicated likely left upper lobe pneumonia.  Given this is his second occurrence of pneumonia we will treat him empirically with Levaquin 500 mg 7 days.  Patient is to follow-up in office 7 days from today for evaluation of symptoms.  Also prescribed a low-dose of prednisone 20 mg x 5 days to help with wheezing and shortness of breath.  Encouraged use of prescribed inhalers.

## 2018-06-05 NOTE — Patient Instructions (Addendum)
Thank you for choosing Primary Care at [facility] to be your medical home!    [name] was seen by Joaquin Courts, FNP today.   [name]'s primary care provider is Bing Neighbors, FNP.   For the best care possible, you should try to see Joaquin Courts, FNP-C whenever you come to the clinic.   We look forward to seeing you again soon!  If you have any questions about your visit today, please call us at [phone] or feel free to reach your primary care provider via MyChart.

## 2018-06-06 LAB — COMPREHENSIVE METABOLIC PANEL
ALT: 11 IU/L (ref 0–44)
AST: 15 IU/L (ref 0–40)
Albumin/Globulin Ratio: 2 (ref 1.2–2.2)
Albumin: 4.6 g/dL (ref 3.6–4.6)
Alkaline Phosphatase: 58 IU/L (ref 39–117)
BUN/Creatinine Ratio: 17 (ref 10–24)
BUN: 20 mg/dL (ref 8–27)
Bilirubin Total: 0.3 mg/dL (ref 0.0–1.2)
CALCIUM: 10.1 mg/dL (ref 8.6–10.2)
CO2: 25 mmol/L (ref 20–29)
Chloride: 100 mmol/L (ref 96–106)
Creatinine, Ser: 1.2 mg/dL (ref 0.76–1.27)
GFR calc Af Amer: 62 mL/min/{1.73_m2} (ref >59)
GFR, EST NON AFRICAN AMERICAN: 54 mL/min/{1.73_m2} — AB (ref >59)
Globulin, Total: 2.3 g/dL (ref 1.5–4.5)
Glucose: 141 mg/dL — ABNORMAL HIGH (ref 65–99)
Potassium: 4.9 mmol/L (ref 3.5–5.2)
Sodium: 141 mmol/L (ref 134–144)
Total Protein: 6.9 g/dL (ref 6.0–8.5)

## 2018-06-06 LAB — CBC WITH DIFFERENTIAL/PLATELET
Basophils Absolute: 0.1 10*3/uL (ref 0.0–0.2)
Basos: 2 %
EOS (ABSOLUTE): 0.5 10*3/uL — ABNORMAL HIGH (ref 0.0–0.4)
EOS: 7 %
HEMOGLOBIN: 14.2 g/dL (ref 13.0–17.7)
Hematocrit: 42.4 % (ref 37.5–51.0)
Immature Grans (Abs): 0 10*3/uL (ref 0.0–0.1)
Immature Granulocytes: 0 %
Lymphocytes Absolute: 1.3 10*3/uL (ref 0.7–3.1)
Lymphs: 19 %
MCH: 28.7 pg (ref 26.6–33.0)
MCHC: 33.5 g/dL (ref 31.5–35.7)
MCV: 86 fL (ref 79–97)
Monocytes Absolute: 0.6 10*3/uL (ref 0.1–0.9)
Monocytes: 8 %
Neutrophils Absolute: 4.6 10*3/uL (ref 1.4–7.0)
Neutrophils: 64 %
Platelets: 215 10*3/uL (ref 150–450)
RBC: 4.95 x10E6/uL (ref 4.14–5.80)
RDW: 13.6 % (ref 11.6–15.4)
WBC: 7.1 10*3/uL (ref 3.4–10.8)

## 2018-06-06 LAB — HEMOGLOBIN A1C
Est. average glucose Bld gHb Est-mCnc: 163 mg/dL
Hgb A1c MFr Bld: 7.3 % — ABNORMAL HIGH (ref 4.8–5.6)

## 2018-06-06 LAB — TSH: TSH: 5.05 u[IU]/mL — ABNORMAL HIGH (ref 0.450–4.500)

## 2018-06-11 ENCOUNTER — Other Ambulatory Visit: Payer: Self-pay

## 2018-06-11 ENCOUNTER — Ambulatory Visit (INDEPENDENT_AMBULATORY_CARE_PROVIDER_SITE_OTHER): Payer: Medicare Other | Admitting: Family Medicine

## 2018-06-11 ENCOUNTER — Encounter: Payer: Self-pay | Admitting: Family Medicine

## 2018-06-11 VITALS — BP 148/72 | HR 59 | Temp 97.7°F | Resp 17 | Ht 66.0 in | Wt 148.0 lb

## 2018-06-11 DIAGNOSIS — J181 Lobar pneumonia, unspecified organism: Secondary | ICD-10-CM

## 2018-06-11 DIAGNOSIS — I2510 Atherosclerotic heart disease of native coronary artery without angina pectoris: Secondary | ICD-10-CM

## 2018-06-11 DIAGNOSIS — E0590 Thyrotoxicosis, unspecified without thyrotoxic crisis or storm: Secondary | ICD-10-CM

## 2018-06-11 DIAGNOSIS — J189 Pneumonia, unspecified organism: Secondary | ICD-10-CM

## 2018-06-11 MED ORDER — LEVOTHYROXINE SODIUM 25 MCG PO TABS: 50.0000 ug | ORAL_TABLET | Freq: Every day | ORAL | 3 refills | Status: DC

## 2018-06-11 NOTE — Patient Instructions (Signed)
Increase levothyroxine 50 mcg once daily.     Hyperthyroidism  Hyperthyroidism is when the thyroid gland is too active (overactive). The thyroid gland is a small gland located in the lower front part of the neck, just in front of the windpipe (trachea). This gland makes hormones that help control how the body uses food for energy (metabolism) as well as how the heart and brain function. These hormones also play a role in keeping your bones strong. When the thyroid is overactive, it produces too much of a hormone called thyroxine. What are the causes? This condition may be caused by:  Graves' disease. This is a disorder in which the body's disease-fighting system (immune system) attacks the thyroid gland. This is the most common cause.  Inflammation of the thyroid gland.  A tumor in the thyroid gland.  Use of certain medicines, including: ? Prescription thyroid hormone replacement. ? Herbal supplements that mimic thyroid hormones. ? Amiodarone therapy.  Solid or fluid-filled lumps within your thyroid gland (thyroid nodules).  Taking in a large amount of iodine from foods or medicines. What increases the risk? You are more likely to develop this condition if:  You are male.  You have a family history of thyroid conditions.  You smoke tobacco.  You use a medicine called lithium.  You take medicines that affect the immune system (immunosuppressants). What are the signs or symptoms? Symptoms of this condition include:  Nervousness.  Inability to tolerate heat.  Unexplained weight loss.  Diarrhea.  Change in the texture of hair or skin.  Heart skipping beats or making extra beats.  Rapid heart rate.  Loss of menstruation.  Shaky hands.  Fatigue.  Restlessness.  Sleep problems.  Enlarged thyroid gland or a lump in the thyroid (nodule). You may also have symptoms of Graves' disease, which may include:  Protruding eyes.  Dry eyes.  Red or swollen eyes.   Problems with vision. How is this diagnosed? This condition may be diagnosed based on:  Your symptoms and medical history.  A physical exam.  Blood tests.  Thyroid ultrasound. This test involves using sound waves to produce images of the thyroid gland.  A thyroid scan. A radioactive substance is injected into a vein, and images show how much iodine is present in the thyroid.  Radioactive iodine uptake test (RAIU). A small amount of radioactive iodine is given by mouth to see how much iodine the thyroid absorbs after a certain amount of time. How is this treated? Treatment depends on the cause and severity of the condition. Treatment may include:  Medicines to reduce the amount of thyroid hormone your body makes.  Radioactive iodine treatment (radioiodine therapy). This involves swallowing a small dose of radioactive iodine, in capsule or liquid form, to kill thyroid cells.  Surgery to remove part or all of your thyroid gland. You may need to take thyroid hormone replacement medicine for the rest of your life after thyroid surgery.  Medicines to help manage your symptoms. Follow these instructions at home:   Take over-the-counter and prescription medicines only as told by your health care provider.  Do not use any products that contain nicotine or tobacco, such as cigarettes and e-cigarettes. If you need help quitting, ask your health care provider.  Follow any instructions from your health care provider about diet. You may be instructed to limit foods that contain iodine.  Keep all follow-up visits as told by your health care provider. This is important. ? You will need to have  blood tests regularly so that your health care provider can monitor your condition. Contact a health care provider if:  Your symptoms do not get better with treatment.  You have a fever.  You are taking thyroid hormone replacement medicine and you: ? Have symptoms of depression. ? Feel like you  are tired all the time. ? Gain weight. Get help right away if:  You have chest pain.  You have decreased alertness or a change in your awareness.  You have abdominal pain.  You feel dizzy.  You have a rapid heartbeat.  You have an irregular heartbeat.  You have difficulty breathing. Summary  The thyroid gland is a small gland located in the lower front part of the neck, just in front of the windpipe (trachea).  Hyperthyroidism is when the thyroid gland is too active (overactive) and produces too much of a hormone called thyroxine.  The most common cause is Graves' disease, a disorder in which your immune system attacks the thyroid gland.  Hyperthyroidism can cause various symptoms, such as unexplained weight loss, nervousness, inability to tolerate heat, or changes in your heartbeat.  Treatment may include medicine to reduce the amount of thyroid hormone your body makes, radioiodine therapy, surgery, or medicines to manage symptoms. This information is not intended to replace advice given to you by your health care provider. Make sure you discuss any questions you have with your health care provider. Document Released: 03/13/2005 Document Revised: 02/21/2017 Document Reviewed: 02/21/2017 Elsevier Interactive Patient Education  2019 [employer].

## 2018-06-11 NOTE — Progress Notes (Signed)
Patient ID: [name], male    DOB: 03-30-1927, 85 y.o.   MRN: [id number]  PCP: Bing Neighbors, FNP  No chief complaint on file.   Subjective:  HPI  [name] is a 85 y.o. male presents for pneumonia follow-up. Patient presented to the office 1 week ago  to establish care and for chronic disease management. On exam, respiratory findings were abnormal and chest x-ray was obtained.  Chest x-ray was significant for left upper lobe pneumonia patient has a history of chronic COPD. Patient was started on Levaquin and prednisone.He presents today accompanied by daughter who reports breathing has significantly improved. Daughter is no longer hearing audible wheezes. No changes in recent activity or appetite. He continues to cough. However, cough is nonproductive and patient suffers from a chronic cough secondary to COPD.  Denies any distress with breathing.  Initially on presentation his oxygen saturation was hovering at 89 to 91%.  On recheck patient sat at rest is between 94% and 95% on room air. Social History   Socioeconomic History  . Marital status: Married    Spouse name: Not on file  . Number of children: Not on file  . Years of education: Not on file  . Highest education level: Not on file  Occupational History  . Not on file  Social Needs  . Financial resource strain: Not on file  . Food insecurity:    Worry: Not on file    Inability: Not on file  . Transportation needs:    Medical: Not on file    Non-medical: Not on file  Tobacco Use  . Smoking status: Former Smoker    Types: Cigarettes  . Smokeless tobacco: Former Engineer, water and Sexual Activity  . Alcohol use: No  . Drug use: No  . Sexual activity: Not on file  Lifestyle  . Physical activity:    Days per week: Not on file    Minutes per session: Not on file  . Stress: Not on file  Relationships  . Social connections:    Talks on phone: Not on file    Gets together: Not on file    Attends  religious service: Not on file    Active member of club or organization: Not on file    Attends meetings of clubs or organizations: Not on file    Relationship status: Not on file  . Intimate partner violence:    Fear of current or ex partner: Not on file    Emotionally abused: Not on file    Physically abused: Not on file    Forced sexual activity: Not on file  Other Topics Concern  . Not on file  Social History Narrative  . Not on file    Family History  Problem Relation Age of Onset  . CAD Son   . Skin cancer Brother    Review of Systems  Pertinent negatives listed in HPI Patient Active Problem List   Diagnosis Date Noted  . Requires supplemental oxygen 06/05/2018  . Chest pain 03/29/2018  . Atrial fibrillation (HCC) 05/20/2015  . RBBB 05/20/2015  . Prolonged Q-T interval on ECG 05/20/2015  . CAD (coronary artery disease) 05/20/2015  . History of CVA (cerebrovascular accident) 05/20/2015  . Atrial fibrillation with RVR (HCC)   . Bronchiectasis with acute exacerbation (HCC)   . Unstable angina (HCC) 03/27/2013  . COPD exacerbation (HCC) 03/25/2013  . Diabetes mellitus type II, uncontrolled (HCC) 03/25/2013  . Hypothyroidism 03/25/2013  .  BPH (benign prostatic hyperplasia) 03/25/2013  . HTN (hypertension) 03/25/2013    No Known Allergies  Prior to Admission medications   Medication Sig Start Date End Date Taking? Authorizing Provider  albuterol (PROVENTIL HFA;VENTOLIN HFA) 108 (90 Base) MCG/ACT inhaler Inhale 2 puffs into the lungs every 4 (four) hours as needed for wheezing or shortness of breath. 05/17/15  Yes Horton, Mayer Masker, MD  amLODipine (NORVASC) 5 MG tablet Take 5 mg by mouth daily.   Yes [provider]  apixaban (ELIQUIS) 5 MG TABS tablet Take 1 tablet (5 mg total) by mouth 2 (two) times daily. 02/03/18  Yes Sheikh, Omair Latif, DO  atorvastatin (LIPITOR) 40 MG tablet Take 1 tablet (40 mg total) by mouth daily at 6 PM. 03/31/18  Yes Santos-Sanchez,  Chelsea Primus, MD  benzonatate (TESSALON) 100 MG capsule Take 1-2 capsules (100-200 mg total) by mouth 3 (three) times daily as needed for cough. 06/05/18  Yes Bing Neighbors, FNP  brimonidine (ALPHAGAN) 0.15 % ophthalmic solution Place 1 drop into both eyes 2 (two) times daily.    Yes [provider]  budesonide-formoterol (SYMBICORT) 80-4.5 MCG/ACT inhaler Inhale 2 puffs into the lungs 2 (two) times daily.   Yes [provider]  Carboxymethylcellulose Sodium 0.25 % SOLN Apply 2 drops to eye 4 (four) times daily.   Yes [provider]  finasteride (PROSCAR) 5 MG tablet Take 5 mg by mouth daily.   Yes [provider]  hydrochlorothiazide (HYDRODIURIL) 25 MG tablet Take 25 mg by mouth daily.    Yes [provider]  Ipratropium-Albuterol (COMBIVENT RESPIMAT) 20-100 MCG/ACT AERS respimat Inhale 1 puff into the lungs every 6 (six) hours as needed for wheezing.    Yes [provider]  levalbuterol (XOPENEX) 0.63 MG/3ML nebulizer solution Take 3 mLs (0.63 mg total) by nebulization every 6 (six) hours as needed for wheezing or shortness of breath. 02/03/18  Yes Sheikh, Omair Latif, DO  levofloxacin (LEVAQUIN) 500 MG tablet Take 1 tablet (500 mg total) by mouth daily. 06/05/18  Yes Bing Neighbors, FNP  levothyroxine (SYNTHROID, LEVOTHROID) 25 MCG tablet Take 25 mcg by mouth daily before breakfast.   Yes [provider]  lisinopril (PRINIVIL,ZESTRIL) 40 MG tablet Take 40 mg by mouth daily.   Yes [provider]  metFORMIN (GLUCOPHAGE) 500 MG tablet Take 500 mg by mouth 2 (two) times daily with a meal.    Yes [provider]  metoprolol (LOPRESSOR) 50 MG tablet Take 25 mg by mouth 2 (two) times daily.   Yes [provider]  Multiple Vitamins-Minerals (MULTIVITAMIN WITH MINERALS) tablet Take 1 tablet by mouth daily.   Yes [provider]  nitroGLYCERIN (NITROSTAT) 0.4 MG SL tablet Place 0.4 mg under the tongue every  5 (five) minutes as needed for chest pain. Dissolve one tablet under the tongue every 5 minutes as needed for chest pain. Repeat every 5 minutes if needed for a total of 3 tablets in 15 minutes. If no relief, call 911.   Yes [provider]  olopatadine (PATANOL) 0.1 % ophthalmic solution Place 1 drop into both eyes 2 (two) times daily. 06/05/18  Yes Bing Neighbors, FNP  omeprazole (PRILOSEC) 20 MG capsule Take 20 mg by mouth daily. .   Yes [provider]  ranitidine (ZANTAC) 300 MG tablet Take 300 mg by mouth at bedtime. Take one tablet by mouth at bedtime in addition to omeprazole for heartburn/chest pain.   Yes [provider]  senna-docusate (SENOKOT-S)  8.6-50 MG tablet Take 2 tablets by mouth at bedtime. 06/05/18  Yes Bing Neighbors, FNP  Skin Protectants, Misc. (EUCERIN) cream Apply 1 application topically See admin instructions. Apply small amount to affected area twice a day.   Yes [provider]    Past Medical, Surgical Family and Social History reviewed and updated.    Objective:   Today's Vitals   06/11/18 0925  BP: (!) 148/72  Pulse: (!) 59  Resp: 17  Temp: 97.7 F (36.5 C)  TempSrc: Oral  SpO2: (!) 89%  Weight: 148 lb (67.1 kg)  Height:  (1.676 m)    Wt Readings from Last 3 Encounters:  06/11/18 148 lb (67.1 kg)  06/05/18 148 lb 9.6 oz (67.4 kg)  05/03/18 151 lb 6.4 oz (68.7 kg)     Physical Exam General appearance: alert, well developed, well nourished, cooperative and in no distress Head: Normocephalic, without obvious abnormality, atraumatic Respiratory: Respirations even and unlabored, normal respiratory rate Heart: rate and rhythm normal. No gallop or murmurs noted on exam  Extremities: No gross deformities Skin: Skin color, texture, turgor normal. No rashes seen  Psych: Appropriate mood and affect. Neurologic: Mental status: Alert, oriented to person, place, and time, thought content appropriate.  Lab  Results  Component Value Date   POCGLU 170 (A) 06/05/2018    Lab Results  Component Value Date   HGBA1C 7.3 (H) 06/05/2018     Assessment & Plan:  1. Hyperthyroidism -TSH was subtherapeutic.  Increase in levothyroxine to 50 mcg once daily in the morning 30 minutes before breakfast. 2. Pneumonia of left upper lobe due to infectious organism (HCC) -No repeat x-ray done today.  Patient appears well today and breathing has markedly improved.  Patient has 1 dose left of antibiotic, advised to complete.  If symptoms recur or worsen follow-up here in office.  -The patient was given clear instructions to go to ER or return to medical center if symptoms do not improve, worsen or new problems develop. The patient verbalized understanding.    Joaquin Courts, FNP [facility] at [facility] [address] [place] [address] [phone]: [phone]

## 2018-06-13 NOTE — Addendum Note (Signed)
Addended by: Bing Neighbors on: 06/13/2018 08:11 AM   Modules accepted: Level of Service

## 2018-06-21 ENCOUNTER — Telehealth: Payer: Self-pay | Admitting: Family Medicine

## 2018-06-21 MED ORDER — BENZONATATE 100 MG PO CAPS: 100.0000 mg | ORAL_CAPSULE | Freq: Three times a day (TID) | ORAL | 1 refills | Status: DC | PRN

## 2018-06-21 MED ORDER — PREDNISONE 20 MG PO TABS: 40.0000 mg | ORAL_TABLET | Freq: Every day | ORAL | 0 refills | Status: AC

## 2018-06-21 NOTE — Telephone Encounter (Signed)
Please advise 

## 2018-06-21 NOTE — Telephone Encounter (Signed)
I will send over another 5 days of prednisone and medication for his cough. Continue inhaler and nebulizer treatments as prescribed. If his symptoms worsens or symptoms do not improve within the next 2-3 days, patient will need evaluation in the ER. The prednisone will be for a higher dose than my last prescription. His blood sugars will likely increase

## 2018-06-21 NOTE — Telephone Encounter (Signed)
[name] notified of recommendations. Expressed understanding.

## 2018-06-21 NOTE — Telephone Encounter (Signed)
Patients daughter in law called to inform PCP that patient has started coughing and wheezing again, she stated that PCP told her to inform us if the condition started to get bad again, please follow up.

## 2018-07-10 ENCOUNTER — Telehealth: Payer: Self-pay | Admitting: Family Medicine

## 2018-07-10 ENCOUNTER — Emergency Department (HOSPITAL_COMMUNITY): Payer: Medicare Other

## 2018-07-10 ENCOUNTER — Encounter (HOSPITAL_COMMUNITY): Payer: Self-pay | Admitting: Emergency Medicine

## 2018-07-10 ENCOUNTER — Other Ambulatory Visit: Payer: Self-pay

## 2018-07-10 ENCOUNTER — Inpatient Hospital Stay (HOSPITAL_COMMUNITY)
Admission: EM | Admit: 2018-07-10 | Discharge: 2018-07-12 | DRG: 065 | Disposition: A | Discharge status: Home Health | Payer: Medicare Other | Attending: Family Medicine | Admitting: Family Medicine

## 2018-07-10 DIAGNOSIS — R29702 NIHSS score 2: Secondary | ICD-10-CM | POA: Diagnosis not present

## 2018-07-10 DIAGNOSIS — Z79899 Other long term (current) drug therapy: Secondary | ICD-10-CM

## 2018-07-10 DIAGNOSIS — Z7984 Long term (current) use of oral hypoglycemic drugs: Secondary | ICD-10-CM

## 2018-07-10 DIAGNOSIS — I6381 Other cerebral infarction due to occlusion or stenosis of small artery: Secondary | ICD-10-CM | POA: Diagnosis present

## 2018-07-10 DIAGNOSIS — R29706 NIHSS score 6: Secondary | ICD-10-CM | POA: Diagnosis present

## 2018-07-10 DIAGNOSIS — Z87891 Personal history of nicotine dependence: Secondary | ICD-10-CM

## 2018-07-10 DIAGNOSIS — E119 Type 2 diabetes mellitus without complications: Secondary | ICD-10-CM | POA: Diagnosis present

## 2018-07-10 DIAGNOSIS — Z8249 Family history of ischemic heart disease and other diseases of the circulatory system: Secondary | ICD-10-CM | POA: Diagnosis not present

## 2018-07-10 DIAGNOSIS — G8191 Hemiplegia, unspecified affecting right dominant side: Secondary | ICD-10-CM | POA: Diagnosis present

## 2018-07-10 DIAGNOSIS — E785 Hyperlipidemia, unspecified: Secondary | ICD-10-CM | POA: Diagnosis present

## 2018-07-10 DIAGNOSIS — I081 Rheumatic disorders of both mitral and tricuspid valves: Secondary | ICD-10-CM | POA: Diagnosis present

## 2018-07-10 DIAGNOSIS — J449 Chronic obstructive pulmonary disease, unspecified: Secondary | ICD-10-CM | POA: Diagnosis present

## 2018-07-10 DIAGNOSIS — R4182 Altered mental status, unspecified: Secondary | ICD-10-CM

## 2018-07-10 DIAGNOSIS — Z7982 Long term (current) use of aspirin: Secondary | ICD-10-CM

## 2018-07-10 DIAGNOSIS — R29701 NIHSS score 1: Secondary | ICD-10-CM | POA: Diagnosis not present

## 2018-07-10 DIAGNOSIS — I6300 Cerebral infarction due to thrombosis of unspecified precerebral artery: Secondary | ICD-10-CM | POA: Diagnosis not present

## 2018-07-10 DIAGNOSIS — I4819 Other persistent atrial fibrillation: Secondary | ICD-10-CM | POA: Diagnosis present

## 2018-07-10 DIAGNOSIS — Z8673 Personal history of transient ischemic attack (TIA), and cerebral infarction without residual deficits: Secondary | ICD-10-CM

## 2018-07-10 DIAGNOSIS — R29704 NIHSS score 4: Secondary | ICD-10-CM | POA: Diagnosis not present

## 2018-07-10 DIAGNOSIS — R29703 NIHSS score 3: Secondary | ICD-10-CM | POA: Diagnosis not present

## 2018-07-10 DIAGNOSIS — Z7951 Long term (current) use of inhaled steroids: Secondary | ICD-10-CM

## 2018-07-10 DIAGNOSIS — E039 Hypothyroidism, unspecified: Secondary | ICD-10-CM | POA: Diagnosis present

## 2018-07-10 DIAGNOSIS — I639 Cerebral infarction, unspecified: Secondary | ICD-10-CM

## 2018-07-10 DIAGNOSIS — Z7901 Long term (current) use of anticoagulants: Secondary | ICD-10-CM | POA: Diagnosis not present

## 2018-07-10 DIAGNOSIS — R4701 Aphasia: Secondary | ICD-10-CM | POA: Diagnosis present

## 2018-07-10 DIAGNOSIS — Z66 Do not resuscitate: Secondary | ICD-10-CM | POA: Diagnosis present

## 2018-07-10 DIAGNOSIS — H9190 Unspecified hearing loss, unspecified ear: Secondary | ICD-10-CM | POA: Diagnosis present

## 2018-07-10 DIAGNOSIS — I2510 Atherosclerotic heart disease of native coronary artery without angina pectoris: Secondary | ICD-10-CM | POA: Diagnosis present

## 2018-07-10 DIAGNOSIS — I10 Essential (primary) hypertension: Secondary | ICD-10-CM | POA: Diagnosis present

## 2018-07-10 DIAGNOSIS — N401 Enlarged prostate with lower urinary tract symptoms: Secondary | ICD-10-CM | POA: Diagnosis present

## 2018-07-10 DIAGNOSIS — I4510 Unspecified right bundle-branch block: Secondary | ICD-10-CM | POA: Diagnosis present

## 2018-07-10 DIAGNOSIS — Z79890 Hormone replacement therapy (postmenopausal): Secondary | ICD-10-CM

## 2018-07-10 LAB — LIPID PANEL
Cholesterol: 226 mg/dL — ABNORMAL HIGH (ref 0–200)
HDL: 43 mg/dL (ref >40)
LDL Cholesterol: 150 mg/dL — ABNORMAL HIGH (ref 0–99)
Total CHOL/HDL Ratio: 5.3 RATIO
Triglycerides: 165 mg/dL — ABNORMAL HIGH (ref <150)
VLDL: 33 mg/dL (ref 0–40)

## 2018-07-10 LAB — URINALYSIS, ROUTINE W REFLEX MICROSCOPIC
Bilirubin Urine: NEGATIVE
Glucose, UA: NEGATIVE mg/dL
Hgb urine dipstick: NEGATIVE
Ketones, ur: NEGATIVE mg/dL
Leukocytes,Ua: NEGATIVE
Nitrite: NEGATIVE
Protein, ur: NEGATIVE mg/dL
Specific Gravity, Urine: 1.011 (ref 1.005–1.030)
pH: 8 (ref 5.0–8.0)

## 2018-07-10 LAB — COMPREHENSIVE METABOLIC PANEL
ALT: 14 U/L (ref 0–44)
AST: 24 U/L (ref 15–41)
Albumin: 3.8 g/dL (ref 3.5–5.0)
Alkaline Phosphatase: 48 U/L (ref 38–126)
Anion gap: 12 (ref 5–15)
BUN: 15 mg/dL (ref 8–23)
CO2: 24 mmol/L (ref 22–32)
Calcium: 9.6 mg/dL (ref 8.9–10.3)
Chloride: 106 mmol/L (ref 98–111)
Creatinine, Ser: 1.06 mg/dL (ref 0.61–1.24)
GFR calc Af Amer: >60 mL/min (ref >60)
GFR calc non Af Amer: >60 mL/min (ref >60)
Glucose, Bld: 123 mg/dL — ABNORMAL HIGH (ref 70–99)
Potassium: 4.1 mmol/L (ref 3.5–5.1)
Sodium: 142 mmol/L (ref 135–145)
Total Bilirubin: 0.9 mg/dL (ref 0.3–1.2)
Total Protein: 6.4 g/dL — ABNORMAL LOW (ref 6.5–8.1)

## 2018-07-10 LAB — DIFFERENTIAL
Abs Immature Granulocytes: 0.03 10*3/uL (ref 0.00–0.07)
Basophils Absolute: 0.1 10*3/uL (ref 0.0–0.1)
Basophils Relative: 1 %
Eosinophils Absolute: 0.4 10*3/uL (ref 0.0–0.5)
Eosinophils Relative: 6 %
Immature Granulocytes: 1 %
Lymphocytes Relative: 19 %
Lymphs Abs: 1.2 10*3/uL (ref 0.7–4.0)
Monocytes Absolute: 0.5 10*3/uL (ref 0.1–1.0)
Monocytes Relative: 8 %
Neutro Abs: 4.2 10*3/uL (ref 1.7–7.7)
Neutrophils Relative %: 65 %

## 2018-07-10 LAB — CBG MONITORING, ED: Glucose-Capillary: 131 mg/dL — ABNORMAL HIGH (ref 70–99)

## 2018-07-10 LAB — ECHOCARDIOGRAM COMPLETE

## 2018-07-10 LAB — HEMOGLOBIN A1C
Hgb A1c MFr Bld: 7.9 % — ABNORMAL HIGH (ref 4.8–5.6)
Mean Plasma Glucose: 180.03 mg/dL

## 2018-07-10 LAB — CBC
HCT: 44.9 % (ref 39.0–52.0)
Hemoglobin: 14 g/dL (ref 13.0–17.0)
MCH: 27.9 pg (ref 26.0–34.0)
MCHC: 31.2 g/dL (ref 30.0–36.0)
MCV: 89.6 fL (ref 80.0–100.0)
Platelets: 170 10*3/uL (ref 150–400)
RBC: 5.01 MIL/uL (ref 4.22–5.81)
RDW: 13.9 % (ref 11.5–15.5)
WBC: 6.4 10*3/uL (ref 4.0–10.5)
nRBC: 0 % (ref 0.0–0.2)

## 2018-07-10 LAB — GLUCOSE, CAPILLARY
Glucose-Capillary: 119 mg/dL — ABNORMAL HIGH (ref 70–99)
Glucose-Capillary: 221 mg/dL — ABNORMAL HIGH (ref 70–99)

## 2018-07-10 LAB — AMMONIA: Ammonia: 16 umol/L (ref 9–35)

## 2018-07-10 LAB — APTT: aPTT: 39 seconds — ABNORMAL HIGH (ref 24–36)

## 2018-07-10 LAB — PROTIME-INR
INR: 1.2 (ref 0.8–1.2)
Prothrombin Time: 15.1 seconds (ref 11.4–15.2)

## 2018-07-10 LAB — I-STAT CREATININE, ED: Creatinine, Ser: 1 mg/dL (ref 0.61–1.24)

## 2018-07-10 MED ORDER — APIXABAN 5 MG PO TABS
5.0000 mg | ORAL_TABLET | Freq: Two times a day (BID) | ORAL | Status: DC
  Administered 2018-07-10 – 2018-07-12 (×4): 5 mg via ORAL
  Filled 2018-07-10 (×4): qty 1

## 2018-07-10 MED ORDER — SENNOSIDES-DOCUSATE SODIUM 8.6-50 MG PO TABS: 1.0000 | ORAL_TABLET | Freq: Every evening | ORAL | Status: DC | PRN

## 2018-07-10 MED ORDER — MOMETASONE FURO-FORMOTEROL FUM 100-5 MCG/ACT IN AERO
2.0000 | INHALATION_SPRAY | Freq: Two times a day (BID) | RESPIRATORY_TRACT | Status: DC
  Administered 2018-07-10 – 2018-07-12 (×4): 2 via RESPIRATORY_TRACT
  Filled 2018-07-10: qty 8.8

## 2018-07-10 MED ORDER — ALBUTEROL SULFATE HFA 108 (90 BASE) MCG/ACT IN AERS: 2.0000 | INHALATION_SPRAY | RESPIRATORY_TRACT | Status: DC | PRN

## 2018-07-10 MED ORDER — OLOPATADINE HCL 0.1 % OP SOLN
1.0000 [drp] | Freq: Two times a day (BID) | OPHTHALMIC | Status: DC
  Administered 2018-07-10 – 2018-07-12 (×4): 1 [drp] via OPHTHALMIC
  Filled 2018-07-10: qty 5

## 2018-07-10 MED ORDER — ACETAMINOPHEN 650 MG RE SUPP: 650.0000 mg | RECTAL | Status: DC | PRN

## 2018-07-10 MED ORDER — SODIUM CHLORIDE 0.9 % IV SOLN
INTRAVENOUS | Status: AC
  Administered 2018-07-10: 19:00:00 via INTRAVENOUS

## 2018-07-10 MED ORDER — NITROGLYCERIN 0.4 MG SL SUBL: 0.4000 mg | SUBLINGUAL_TABLET | SUBLINGUAL | Status: DC | PRN

## 2018-07-10 MED ORDER — ATORVASTATIN CALCIUM 80 MG PO TABS
80.0000 mg | ORAL_TABLET | Freq: Every day | ORAL | Status: DC
  Administered 2018-07-11: 80 mg via ORAL
  Filled 2018-07-10: qty 1

## 2018-07-10 MED ORDER — SODIUM CHLORIDE 0.9% FLUSH: 3.0000 mL | Freq: Once | INTRAVENOUS | Status: DC

## 2018-07-10 MED ORDER — ACETAMINOPHEN 325 MG PO TABS
650.0000 mg | ORAL_TABLET | ORAL | Status: DC | PRN
  Administered 2018-07-11 – 2018-07-12 (×2): 650 mg via ORAL
  Filled 2018-07-10 (×2): qty 2

## 2018-07-10 MED ORDER — STROKE: EARLY STAGES OF RECOVERY BOOK
Freq: Once | Status: AC
  Administered 2018-07-10: 22:00:00

## 2018-07-10 MED ORDER — LEVOTHYROXINE SODIUM 50 MCG PO TABS
50.0000 ug | ORAL_TABLET | Freq: Every day | ORAL | Status: DC
  Administered 2018-07-11 – 2018-07-12 (×2): 50 ug via ORAL
  Filled 2018-07-10 (×2): qty 1

## 2018-07-10 MED ORDER — METFORMIN HCL 500 MG PO TABS
500.0000 mg | ORAL_TABLET | Freq: Two times a day (BID) | ORAL | Status: DC
  Administered 2018-07-11 – 2018-07-12 (×3): 500 mg via ORAL
  Filled 2018-07-10 (×3): qty 1

## 2018-07-10 MED ORDER — IOHEXOL 350 MG/ML SOLN
75.0000 mL | Freq: Once | INTRAVENOUS | Status: AC | PRN
  Administered 2018-07-10: 75 mL via INTRAVENOUS

## 2018-07-10 MED ORDER — ACETAMINOPHEN 160 MG/5ML PO SOLN: 650.0000 mg | ORAL | Status: DC | PRN

## 2018-07-10 NOTE — Consult Note (Signed)
Neurology Consultation  Reason for Consult: Code stroke Referring Physician: Dr. Trudie Reed  CC: Expressive aphasia  History is obtained from: EMS  HPI: [name] is a 85 y.o. male with history of hard of hearing, essential hypertension, diabetes, CVA back in 2001 involving the left parietal lobe, COPD and atrial fibrillation and on Eliquis. Patient from home where he was LKW yesterday at 3. At 2200 he told his family that something was not right, however, he went to sleep. He woke up this morning with difficulty speaking and EMS was called. EMS assessed right sided weakness and aphasia and activated a code stroke. Stroke team at the bedside on patient arrival. Labs attempted and patient cleared for CT by Dr. Pilar Plate. Patient to CT with team. CT completed. NIHSS 6,    He did take his Eliquis last night thus is not a TPA candidate.  Patient was immediately brought to CT to obtain CT of head and CTA of head and neck.  Chart review patient was seen back in 2001 for stroke inLEFT ANTERIOR PARIETAL AND LEFT POSTERIOR OPERCULAR AREAS.  LKW: ?  8 PM on 07/09/2018   tpa given?: no, on Eliquis Premorbid modified Rankin scale (mRS): 0  NIH stroke score: 4   ROS: Unable to obtain due to altered mental status but positive for as stated in history of presenting illness.   Past Medical History:  Diagnosis Date  . Atrial fibrillation (HCC)   . COPD (chronic obstructive pulmonary disease) (HCC)   . CVA (cerebral infarction)   . Diabetes mellitus without complication (HCC)   . Essential hypertension, benign   . Hard of hearing     Family History  Problem Relation Age of Onset  . CAD Son   . Skin cancer Brother     Social History:   reports that he has quit smoking. His smoking use included cigarettes. He has quit using smokeless tobacco. He reports that he does not drink alcohol or use drugs.  Medications  Current Facility-Administered Medications:  .  sodium chloride flush (NS) 0.9 %  injection 3 mL, 3 mL, Intravenous, Once, Bero, Elmer Sow, MD  Current Outpatient Medications:  .  albuterol (PROVENTIL HFA;VENTOLIN HFA) 108 (90 Base) MCG/ACT inhaler, Inhale 2 puffs into the lungs every 4 (four) hours as needed for wheezing or shortness of breath., Disp: 1 Inhaler, Rfl: 0 .  amLODipine (NORVASC) 5 MG tablet, Take 5 mg by mouth daily., Disp: , Rfl:  .  apixaban (ELIQUIS) 5 MG TABS tablet, Take 1 tablet (5 mg total) by mouth 2 (two) times daily., Disp: 60 tablet, Rfl: 0 .  atorvastatin (LIPITOR) 40 MG tablet, Take 1 tablet (40 mg total) by mouth daily at 6 PM., Disp: 30 tablet, Rfl: 0 .  benzonatate (TESSALON) 100 MG capsule, Take 1-2 capsules (100-200 mg total) by mouth 3 (three) times daily as needed for cough., Disp: 60 capsule, Rfl: 1 .  brimonidine (ALPHAGAN) 0.15 % ophthalmic solution, Place 1 drop into both eyes 2 (two) times daily. , Disp: , Rfl:  .  budesonide-formoterol (SYMBICORT) 80-4.5 MCG/ACT inhaler, Inhale 2 puffs into the lungs 2 (two) times daily., Disp: , Rfl:  .  Carboxymethylcellulose Sodium 0.25 % SOLN, Apply 2 drops to eye 4 (four) times daily., Disp: , Rfl:  .  finasteride (PROSCAR) 5 MG tablet, Take 5 mg by mouth daily., Disp: , Rfl:  .  hydrochlorothiazide (HYDRODIURIL) 25 MG tablet, Take 25 mg by mouth daily. , Disp: , Rfl:  .  Ipratropium-Albuterol (COMBIVENT RESPIMAT) 20-100 MCG/ACT AERS respimat, Inhale 1 puff into the lungs every 6 (six) hours as needed for wheezing. , Disp: , Rfl:  .  levalbuterol (XOPENEX) 0.63 MG/3ML nebulizer solution, Take 3 mLs (0.63 mg total) by nebulization every 6 (six) hours as needed for wheezing or shortness of breath., Disp: 3 mL, Rfl: 12 .  levofloxacin (LEVAQUIN) 500 MG tablet, Take 1 tablet (500 mg total) by mouth daily., Disp: 7 tablet, Rfl: 0 .  levothyroxine (SYNTHROID, LEVOTHROID) 25 MCG tablet, Take 2 tablets (50 mcg total) by mouth daily before breakfast., Disp: 60 tablet, Rfl: 3 .  lisinopril (PRINIVIL,ZESTRIL) 40  MG tablet, Take 40 mg by mouth daily., Disp: , Rfl:  .  metFORMIN (GLUCOPHAGE) 500 MG tablet, Take 500 mg by mouth 2 (two) times daily with a meal. , Disp: , Rfl:  .  metoprolol (LOPRESSOR) 50 MG tablet, Take 25 mg by mouth 2 (two) times daily., Disp: , Rfl:  .  Multiple Vitamins-Minerals (MULTIVITAMIN WITH MINERALS) tablet, Take 1 tablet by mouth daily., Disp: , Rfl:  .  nitroGLYCERIN (NITROSTAT) 0.4 MG SL tablet, Place 0.4 mg under the tongue every 5 (five) minutes as needed for chest pain. Dissolve one tablet under the tongue every 5 minutes as needed for chest pain. Repeat every 5 minutes if needed for a total of 3 tablets in 15 minutes. If no relief, call 911., Disp: , Rfl:  .  olopatadine (PATANOL) 0.1 % ophthalmic solution, Place 1 drop into both eyes 2 (two) times daily., Disp: 5 mL, Rfl: 12 .  omeprazole (PRILOSEC) 20 MG capsule, Take 20 mg by mouth daily. ., Disp: , Rfl:  .  ranitidine (ZANTAC) 300 MG tablet, Take 300 mg by mouth at bedtime. Take one tablet by mouth at bedtime in addition to omeprazole for heartburn/chest pain., Disp: , Rfl:  .  senna-docusate (SENOKOT-S) 8.6-50 MG tablet, Take 2 tablets by mouth at bedtime., Disp: 60 tablet, Rfl: 3 .  Skin Protectants, Misc. (EUCERIN) cream, Apply 1 application topically See admin instructions. Apply small amount to affected area twice a day., Disp: , Rfl:    Exam: Current vital signs: There were no vitals taken for this visit. Vital signs in last 24 hours:    Physical Exam  Constitutional: Appears pale elderly Caucasian male not in distress  psych: Currently a phasic but not agitated Eyes: No scleral injection HENT: No OP obstrucion Head: Normocephalic.  Cardiovascular: Atrial fibrillation Respiratory: Effort normal, non-labored breathing GI: Soft.  No distension. There is no tenderness.  Skin: WDI  Neuro: Mental Status: Awake alert.  Diminished attention, registration and recall.  Slight nonfluent speech with occasional  word hesitancy.  Slight difficulty with naming and comprehension.  But can follow simple midline and one-step commands.  Mild dysarthria.   Cranial Nerves: II: Visual Fields are full. Pupils are equal, round, and reactive to light.  Blinks to threat bilaterally III,IV, VI: EOMI without ptosis or diploplia.  V: Facial sensation is symmetric to temperature VII: Facial movement is symmetric.  VIII: hearing is diminished bilaterally X: Uvula elevates symmetrically XI: Shoulder shrug is symmetric. XII: tongue is midline without atrophy or fasciculations.  Motor: Tone is normal. Bulk is normal. 5/5 strength was present in all four extremities.  But diminished fine finger movements on the right.  Orbits left over right upper extremity.  Mild right grip weakness. Sensory: Sensation is symmetric to light touch and temperature in the arms and legs.  Deep Tendon Reflexes: 2+ and  symmetric in the biceps and patellae.   Plantars: Toes are downgoing bilaterally.  Cerebellar: FNF and HKS are intact bilaterally. Gait not tested  NIHSS4   Labs I have reviewed labs in epic and the results pertinent to this consultation are:   CBC    Component Value Date/Time   WBC 7.1 06/05/2018 1048   WBC 6.3 04/02/2018 0804   RBC 4.95 06/05/2018 1048   RBC 4.94 04/02/2018 0804   HGB 14.2 06/05/2018 1048   HCT 42.4 06/05/2018 1048   PLT 215 06/05/2018 1048   MCV 86 06/05/2018 1048   MCH 28.7 06/05/2018 1048   MCH 27.7 04/02/2018 0804   MCHC 33.5 06/05/2018 1048   MCHC 30.9 04/02/2018 0804   RDW 13.6 06/05/2018 1048   LYMPHSABS 1.3 06/05/2018 1048   MONOABS 0.7 02/03/2018 0540   EOSABS 0.5 (H) 06/05/2018 1048   BASOSABS 0.1 06/05/2018 1048    CMP     Component Value Date/Time   NA 141 06/05/2018 1048   K 4.9 06/05/2018 1048   CL 100 06/05/2018 1048   CO2 25 06/05/2018 1048   GLUCOSE 141 (H) 06/05/2018 1048   GLUCOSE 140 (H) 04/02/2018 0804   BUN 20 06/05/2018 1048   CREATININE 1.20  06/05/2018 1048   CALCIUM 10.1 06/05/2018 1048   PROT 6.9 06/05/2018 1048   ALBUMIN 4.6 06/05/2018 1048   AST 15 06/05/2018 1048   ALT 11 06/05/2018 1048   ALKPHOS 58 06/05/2018 1048   BILITOT 0.3 06/05/2018 1048   GFRNONAA 54 (L) 06/05/2018 1048   GFRAA 62 06/05/2018 1048    Lipid Panel     Component Value Date/Time   CHOL 131 05/21/2015 0458   TRIG 123 05/21/2015 0458   HDL 35 (L) 05/21/2015 0458   CHOLHDL 3.7 05/21/2015 0458   VLDL 25 05/21/2015 0458   LDLCALC 71 05/21/2015 0458     Imaging I have reviewed the images obtained:   CT-scan of the brain shows old left parietal MCA branch infarct of chronic age.  Old left cerebellar infarct CT angiogram of the brain shows chronic left M2 superior division occlusion.  No significant proximal carotid occlusion  MRI examination of the brain pending  Felicie Morn PA-C [facility] [phone]  M-F  (9:00 am- 5:00 PM)  07/10/2018, 12:10 PM     Assessment: 85 year old Caucasian male with sudden onset of altered mental status and expressive aphasia and right hemiparesis possibly from left hemispheric small infarct but other differentials include unwitnessed seizure with postictal confusion.  Patient has presented beyond time window for TPA and he is not a candidate for mechanical thrombectomy given low NIH stroke scale and deficits which are probably mostly old Vascular risk factors of atrial fibrillation, old stroke, hypertension, diabetes  Recommendations: Patient is not a candidate for thrombolysis due to being currently on Eliquis and not a candidate for mechanical thrombectomy due to low NIH stroke scale and deficits mostly being old.  Recommend admit to the medical team for further evaluation.  Check MRI scan of the brain for interval new stroke as well as EEG for seizure.  Check 2D echo, lipid profile, hemoglobin A1c, urine analysis and chest x-ray as well as lab work to look for reversible etiology.  Continue  Eliquis for stroke prevention and aggressive risk factor modification with strict control of hypertension, diabetes and lipids.  Stroke team will follow.Kindly  call for questions.  Discussed with emergency medicine team physician assistant and MD and answered questions I have spent  a total of 80  minutes with the patient reviewing hospital notes,  test results, labs and examining the patient as well as establishing an assessment and plan that was discussed personally with the patient.  > 50% of time was spent in direct patient care.  Delia Heady, MD Medical Director [facility] Pager: [phone] 07/10/2018 1:07 PM

## 2018-07-10 NOTE — Progress Notes (Signed)
I saw and examined this patient in the ER.  I discussed with Dr. Artist Pais.  We have agreed on a treatment plan.  Appreciate stroke team consult.  I will cosign Dr. Olegario Messier H&PE when avialable.  Briefly, 85 yo male with previous CVA presents with new confusion and right sided weakness.  That weakness has largely resolved by the time Dr. Artist Pais and I examined.  He does have slurred speech and states he has had speech problems for years.  His cognition is not good  Oriented to person, somewhat to place (knew [place] but did not know hospital) and somewhat to time (knew spring but could not guess what year), so hx is unreliable.  MRI shows acute/subacute pontine CVA.   We will continue WU and contact family to better understand his baseline and his acute symptoms.    NO symptoms of COVID.  Denies fever, cough SOB.

## 2018-07-10 NOTE — ED Provider Notes (Addendum)
[facility] EMERGENCY DEPARTMENT Provider Note   CSN: [id number] Arrival date & time: 07/10/18  1202  An emergency department physician performed an initial assessment on this suspected stroke patient at 1203.  History   Chief Complaint Aphasia, right sided weakness.   Level 5 caveat invoked due to altered mental status. HPI [name] is a 85 y.o. male with history of atrial fibrillation, COPD, prior CVA, diabetes mellitus, hypertension, hard of hearing presents for evaluation of acute onset of possible strokelike symptoms.  Patient was brought in by EMS, last known well yesterday at 10 PM.  At that time he told his family that "something was not right ", and then apparently he went to sleep.  When he awoke this morning he had significant difficulty speaking.  Chart review notes a telephone encounter to the patient's PCP which states that the patient's daughter-in-law called to try to set up an appointment stating the patient was unable to move half of his body and was unable to speak.  His PCP recommended presentation to the ED emergently.  Apparently on EMS arrival to his home he was found to have right-sided weakness and aphasia and so a code stroke was activated in the field.  Stroke team and myself assessed the patient at bedside on arrival.  Patient is on Eliquis for his A. fib and last took it last night, thus is not a TPA candidate.  On my assessment the patient tells me he does not know why he is here.  In general he reports that he is feeling unwell.  He notes some urinary frequency and dysuria but does not know how long this has been present.  He is oriented to person and place but not time or events.  He denies fever, chills, cough, shortness of breath, chest pain, headaches, numbness, tingling, or weakness.       The history is provided by the patient, the EMS personnel and medical records. The history is limited by the condition of the patient.    Past  Medical History:  Diagnosis Date   Atrial fibrillation (HCC)    COPD (chronic obstructive pulmonary disease) (HCC)    CVA (cerebral infarction)    Diabetes mellitus without complication (HCC)    Essential hypertension, benign    Hard of hearing     Patient Active Problem List   Diagnosis Date Noted   Requires supplemental oxygen 06/05/2018   Chest pain 03/29/2018   Atrial fibrillation (HCC) 05/20/2015   RBBB 05/20/2015   Prolonged Q-T interval on ECG 05/20/2015   CAD (coronary artery disease) 05/20/2015   History of CVA (cerebrovascular accident) 05/20/2015   Atrial fibrillation with RVR (HCC)    Bronchiectasis with acute exacerbation (HCC)    Unstable angina (HCC) 03/27/2013   COPD exacerbation (HCC) 03/25/2013   Diabetes mellitus type II, uncontrolled (HCC) 03/25/2013   Hypothyroidism 03/25/2013   BPH (benign prostatic hyperplasia) 03/25/2013   HTN (hypertension) 03/25/2013    Past Surgical History:  Procedure Laterality Date   HERNIA REPAIR     LEFT HEART CATHETERIZATION WITH CORONARY ANGIOGRAM N/A 03/28/2013   Procedure: LEFT HEART CATHETERIZATION WITH CORONARY ANGIOGRAM;  Surgeon: Peter M Swaziland, MD;  Location: [facility] CATH LAB;  Service: Cardiovascular;  Laterality: N/A;        Home Medications    Prior to Admission medications   Medication Sig Start Date End Date Taking? Authorizing Provider  apixaban (ELIQUIS) 5 MG TABS tablet Take 1 tablet (5 mg total) by  mouth 2 (two) times daily. 02/03/18  Yes Sheikh, Omair Latif, DO  aspirin 325 MG EC tablet Take 325 mg by mouth as needed (for headaches).   Yes [provider]  Aspirin-Salicylamide-Caffeine (BC HEADACHE POWDER PO) Take 1 packet by mouth as needed (for headaches).   Yes [provider]  atorvastatin (LIPITOR) 40 MG tablet Take 1 tablet (40 mg total) by mouth daily at 6 PM. 03/31/18  Yes Santos-Sanchez, Chelsea Primus, MD  benzonatate (TESSALON) 100 MG capsule Take 1-2 capsules (100-200  mg total) by mouth 3 (three) times daily as needed for cough. 06/21/18  Yes Bing Neighbors, FNP  brimonidine (ALPHAGAN) 0.15 % ophthalmic solution Place 1 drop into both eyes 2 (two) times daily.    Yes [provider]  budesonide-formoterol (SYMBICORT) 80-4.5 MCG/ACT inhaler Inhale 2 puffs into the lungs 2 (two) times daily.   Yes [provider]  Carboxymethylcellulose Sodium 0.25 % SOLN Place 2 drops into both eyes 4 (four) times daily as needed (for dry eyes).    Yes [provider]  ipratropium-albuterol (DUONEB) 0.5-2.5 (3) MG/3ML SOLN Take 3 mLs by nebulization 4 (four) times daily.   Yes [provider]  albuterol (PROVENTIL HFA;VENTOLIN HFA) 108 (90 Base) MCG/ACT inhaler Inhale 2 puffs into the lungs every 4 (four) hours as needed for wheezing or shortness of breath. 05/17/15   Horton, Mayer Masker, MD  amLODipine (NORVASC) 5 MG tablet Take 5 mg by mouth daily.    [provider]  finasteride (PROSCAR) 5 MG tablet Take 5 mg by mouth daily.    [provider]  hydrochlorothiazide (HYDRODIURIL) 25 MG tablet Take 25 mg by mouth daily.     [provider]  Ipratropium-Albuterol (COMBIVENT RESPIMAT) 20-100 MCG/ACT AERS respimat Inhale 1 puff into the lungs every 6 (six) hours as needed for wheezing.     [provider]  levalbuterol Pauline Aus) 0.63 MG/3ML nebulizer solution Take 3 mLs (0.63 mg total) by nebulization every 6 (six) hours as needed for wheezing or shortness of breath. 02/03/18   Sheikh, Omair Latif, DO  levofloxacin (LEVAQUIN) 500 MG tablet Take 1 tablet (500 mg total) by mouth daily. Patient not taking: Reported on 07/10/2018 06/05/18   Bing Neighbors, FNP  levothyroxine (SYNTHROID, LEVOTHROID) 25 MCG tablet Take 2 tablets (50 mcg total) by mouth daily before breakfast. 06/11/18   Bing Neighbors, FNP  lisinopril (PRINIVIL,ZESTRIL) 40 MG tablet Take 40 mg by mouth daily.    [provider]  metFORMIN  (GLUCOPHAGE) 500 MG tablet Take 500 mg by mouth 2 (two) times daily with a meal.     [provider]  metoprolol (LOPRESSOR) 50 MG tablet Take 25 mg by mouth 2 (two) times daily.    [provider]  MUCINEX 600 MG 12 hr tablet  02/03/18   [provider]  Multiple Vitamins-Minerals (MULTIVITAMIN WITH MINERALS) tablet Take 1 tablet by mouth daily.    [provider]  nitroGLYCERIN (NITROSTAT) 0.4 MG SL tablet Place 0.4 mg under the tongue every 5 (five) minutes as needed for chest pain. Dissolve one tablet under the tongue every 5 minutes as needed for chest pain. Repeat every 5 minutes if needed for a total of 3 tablets in 15 minutes. If no relief, call 911.    [provider]  olopatadine (PATANOL) 0.1 % ophthalmic solution Place 1 drop into both eyes 2 (two) times daily. 06/05/18   Bing Neighbors, FNP  omeprazole (PRILOSEC) 20 MG capsule  Take 20 mg by mouth daily. .    [provider]  senna-docusate (SENOKOT-S) 8.6-50 MG tablet Take 2 tablets by mouth at bedtime. 06/05/18   Bing Neighbors, FNP  Skin Protectants, Misc. (EUCERIN) cream Apply 1 application topically See admin instructions. Apply small amount to affected area twice a day.    [provider]    Family History Family History  Problem Relation Age of Onset   CAD Son    Skin cancer Brother     Social History Social History   Tobacco Use   Smoking status: Former Smoker    Types: Cigarettes   Smokeless tobacco: Former Neurosurgeon  Substance Use Topics   Alcohol use: No   Drug use: No     Allergies   Patient has no known allergies.   Review of Systems Review of Systems  Unable to perform ROS: Mental status change     Physical Exam Updated Vital Signs BP (!) 162/81    Pulse (!) 107    Temp 97.9 F (36.6 C) (Oral)    Resp (!) 25    SpO2 93%   Physical Exam Vitals signs and nursing note reviewed.  Constitutional:      General: He is not in acute  distress.    Appearance: He is well-developed.  HENT:     Head: Normocephalic and atraumatic.  Eyes:     General:        Right eye: No discharge.        Left eye: No discharge.     Extraocular Movements: Extraocular movements intact.     Conjunctiva/sclera: Conjunctivae normal.     Pupils: Pupils are equal, round, and reactive to light.  Neck:     Vascular: No JVD.     Trachea: No tracheal deviation.  Cardiovascular:     Rate and Rhythm: Normal rate. Rhythm irregular.     Comments: Irregularly irregular rhythm.  2+ radial and DP/PT pulses bilaterally.  No lower extremity edema. Pulmonary:     Effort: Pulmonary effort is normal.  Abdominal:     General: Bowel sounds are normal. There is no distension.     Palpations: Abdomen is soft.     Tenderness: There is abdominal tenderness in the suprapubic area. There is no guarding or rebound.  Skin:    General: Skin is warm and dry.     Findings: No erythema.  Neurological:     Mental Status: He is alert.     Comments: Oriented to person and place but not time or events.  Follows commands without difficulty.  Cranial nerves II through XII tested and intact.  No pronator drift.  4+/5 strength of BUE and BLE major muscle groups.  Patient intact to soft touch of face and extremities.  Psychiatric:        Behavior: Behavior normal.      ED Treatments / Results  Labs (all labs ordered are listed, but only abnormal results are displayed) Labs Reviewed  APTT - Abnormal; Notable for the following components:      Result Value   aPTT 39 (*)    All other components within normal limits  COMPREHENSIVE METABOLIC PANEL - Abnormal; Notable for the following components:   Glucose, Bld 123 (*)    Total Protein 6.4 (*)    All other components within normal limits  URINALYSIS, ROUTINE W REFLEX MICROSCOPIC - Abnormal; Notable for the following components:   Color, Urine COLORLESS (*)    All other  components within normal limits  HEMOGLOBIN A1C  - Abnormal; Notable for the following components:   Hgb A1c MFr Bld 7.9 (*)    All other components within normal limits  LIPID PANEL - Abnormal; Notable for the following components:   Cholesterol 226 (*)    Triglycerides 165 (*)    LDL Cholesterol 150 (*)    All other components within normal limits  CBG MONITORING, ED - Abnormal; Notable for the following components:   Glucose-Capillary 131 (*)    All other components within normal limits  URINE CULTURE  PROTIME-INR  CBC  DIFFERENTIAL  AMMONIA  I-STAT CREATININE, ED    EKG EKG Interpretation  Date/Time:  Wednesday July 10 2018 12:34:54 EDT Ventricular Rate:  107 PR Interval:    QRS Duration: 136 QT Interval:  398 QTC Calculation: 531 R Axis:   -89 Text Interpretation:  Atrial fibrillation Right bundle branch block Anterior infarct, old Confirmed by Kennis Carina [phone]) on 07/10/2018 12:38:01 PM   Radiology Ct Angio Head W Or Wo Contrast  Result Date: 07/10/2018 CLINICAL DATA:  Slurred speech EXAM: CT ANGIOGRAPHY HEAD AND NECK TECHNIQUE: Multidetector CT imaging of the head and neck was performed using the standard protocol during bolus administration of intravenous contrast. Multiplanar CT image reconstructions and MIPs were obtained to evaluate the vascular anatomy. Carotid stenosis measurements (when applicable) are obtained utilizing NASCET criteria, using the distal internal carotid diameter as the denominator. CONTRAST:  75mL OMNIPAQUE IOHEXOL 350 MG/ML SOLN COMPARISON:  Head CT same day FINDINGS: CTA NECK FINDINGS Aortic arch: Atherosclerotic irregularity of the aortic arch. Branching pattern is normal with left vertebral artery arising from the arch. No origin stenosis. Right carotid system: Common carotid artery widely patent to the bifurcation region. Calcified plaque at the carotid bifurcation and ICA bulb. No stenosis. Cervical ICA widely patent beyond that. Left carotid system: Common carotid artery widely patent to  the bifurcation. Soft and calcified plaque at the carotid bifurcation and ICA bulb. Minimal diameter at the distal bulb is 4.5 mm. Compared to a more distal cervical ICA diameter of 5 mm, this indicates a 10% stenosis. Vertebral arteries: Both vertebral arteries are widely patent. As noted above, the left vertebral artery arises from the arch. Skeleton: Ordinary cervical spondylosis. Other neck: No mass or lymphadenopathy. Upper chest: Emphysema and pulmonary scarring.  No active process. Review of the MIP images confirms the above findings CTA HEAD FINDINGS Anterior circulation: Right internal carotid artery is patent through the siphon region. There is atherosclerotic calcification but no narrowing greater than 30%. Right ICA supplies primarily the right middle cerebral artery territory. There is a diminutive A1 segment. No large or medium vessel occlusion identified. On the left, the ICA is patent through the siphon region with calcification but no stenosis greater than 30%. Dominant A1 segment is widely patent. Left M1 segment is patent and normal. Superior division of the left middle cerebral artery is presumably chronically occluded, serving the region of old infarction. No sign of acute embolic occlusion. Posterior circulation: Both vertebral arteries are patent through the foramen magnum to the basilar. No basilar stenosis. Posterior circulation branch vessels are patent. Venous sinuses: Patent and normal. Anatomic variants: None significant otherwise. Delayed phase: No abnormal enhancement. Review of the MIP images confirms the above findings IMPRESSION: Atherosclerotic change of the aorta with marked irregularity. Atherosclerotic change at both carotid bifurcations. No stenosis on the right. 10% stenosis of the distal bulb on the left. No acute intracranial large or medium vessel  occlusion. Presumably chronic occlusion of the superior division left MCA M2 branch, serving the region of the old left MCA  territory infarction. Electronically Signed   By: Paulina Fusi M.D.   On: 07/10/2018 12:32   Dg Chest 2 View  Result Date: 07/10/2018 CLINICAL DATA:  Altered mental status.  Stroke. EXAM: CHEST - 2 VIEW COMPARISON:  06/05/2018 FINDINGS: Artifact overlies the chest. Heart size is at the upper limits of normal. There is aortic atherosclerosis. The lungs are clear except for mild scarring. No infiltrate, collapse or effusion. IMPRESSION: No active cardiopulmonary disease. Electronically Signed   By: Paulina Fusi M.D.   On: 07/10/2018 13:27   Ct Angio Neck W Or Wo Contrast  Result Date: 07/10/2018 CLINICAL DATA:  Slurred speech EXAM: CT ANGIOGRAPHY HEAD AND NECK TECHNIQUE: Multidetector CT imaging of the head and neck was performed using the standard protocol during bolus administration of intravenous contrast. Multiplanar CT image reconstructions and MIPs were obtained to evaluate the vascular anatomy. Carotid stenosis measurements (when applicable) are obtained utilizing NASCET criteria, using the distal internal carotid diameter as the denominator. CONTRAST:  75mL OMNIPAQUE IOHEXOL 350 MG/ML SOLN COMPARISON:  Head CT same day FINDINGS: CTA NECK FINDINGS Aortic arch: Atherosclerotic irregularity of the aortic arch. Branching pattern is normal with left vertebral artery arising from the arch. No origin stenosis. Right carotid system: Common carotid artery widely patent to the bifurcation region. Calcified plaque at the carotid bifurcation and ICA bulb. No stenosis. Cervical ICA widely patent beyond that. Left carotid system: Common carotid artery widely patent to the bifurcation. Soft and calcified plaque at the carotid bifurcation and ICA bulb. Minimal diameter at the distal bulb is 4.5 mm. Compared to a more distal cervical ICA diameter of 5 mm, this indicates a 10% stenosis. Vertebral arteries: Both vertebral arteries are widely patent. As noted above, the left vertebral artery arises from the arch.  Skeleton: Ordinary cervical spondylosis. Other neck: No mass or lymphadenopathy. Upper chest: Emphysema and pulmonary scarring.  No active process. Review of the MIP images confirms the above findings CTA HEAD FINDINGS Anterior circulation: Right internal carotid artery is patent through the siphon region. There is atherosclerotic calcification but no narrowing greater than 30%. Right ICA supplies primarily the right middle cerebral artery territory. There is a diminutive A1 segment. No large or medium vessel occlusion identified. On the left, the ICA is patent through the siphon region with calcification but no stenosis greater than 30%. Dominant A1 segment is widely patent. Left M1 segment is patent and normal. Superior division of the left middle cerebral artery is presumably chronically occluded, serving the region of old infarction. No sign of acute embolic occlusion. Posterior circulation: Both vertebral arteries are patent through the foramen magnum to the basilar. No basilar stenosis. Posterior circulation branch vessels are patent. Venous sinuses: Patent and normal. Anatomic variants: None significant otherwise. Delayed phase: No abnormal enhancement. Review of the MIP images confirms the above findings IMPRESSION: Atherosclerotic change of the aorta with marked irregularity. Atherosclerotic change at both carotid bifurcations. No stenosis on the right. 10% stenosis of the distal bulb on the left. No acute intracranial large or medium vessel occlusion. 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 Electronically Signed   By: Paulina Fusi M.D.   On: 07/10/2018 12:32   Mr Brain Wo Contrast  Result Date: 07/10/2018 CLINICAL DATA:  Stroke follow-up EXAM: MRI HEAD WITHOUT CONTRAST TECHNIQUE:  Multiplanar, multiecho pulse sequences of the brain and surrounding structures were obtained without intravenous contrast.  COMPARISON:  CTA head neck 07/10/2018 FINDINGS: BRAIN: Small focus of abnormal diffusion restriction within the left pons. No other diffusion abnormality. The midline structures are normal. Old posterior left MCA territory infarct. Multifocal white matter hyperintensity, most commonly due to chronic ischemic microangiopathy. Generalized atrophy without lobar predilection. Susceptibility-sensitive sequences show no chronic microhemorrhage or superficial siderosis. No mass lesion. VASCULAR: The major intracranial arterial and venous sinus flow voids are normal. SKULL AND UPPER CERVICAL SPINE: Calvarial bone marrow signal is normal. There is no skull base mass. Visualized upper cervical spine and soft tissues are normal. SINUSES/ORBITS: No fluid levels or advanced mucosal thickening. No mastoid or middle ear effusion. The orbits are normal. IMPRESSION: 1. Small acute or early subacute infarct the left pons, in keeping with reported right-sided weakness. No acute hemorrhage. 2. Old posterior left MCA territory infarct encephalomalacia. 3. Generalized atrophy. Electronically Signed   By: Deatra Robinson M.D.   On: 07/10/2018 16:04   Ct Head Code Stroke Wo Contrast  Result Date: 07/10/2018 CLINICAL DATA:  Code stroke.  Slurred speech EXAM: CT HEAD WITHOUT CONTRAST TECHNIQUE: Contiguous axial images were obtained from the base of the skull through the vertex without intravenous contrast. COMPARISON:  06/13/2017 FINDINGS: Brain: No brainstem abnormality is seen. Old left cerebellar infarction. Old infarction in the left MCA territory with atrophy, encephalomalacia and gliosis of the deep insula, posterior temporal lobe and parietal lobe. No sign of acute infarction, mass lesion, hemorrhage, hydrocephalus or extra-axial collection Vascular: There is atherosclerotic calcification of the major vessels at the base of the brain. Skull: Negative Sinuses/Orbits: Clear/normal Other: Left parotid mass as seen previously. ASPECTS  [facility] Stroke Program Early CT Score) - Ganglionic level infarction (caudate, lentiform nuclei, internal capsule, insula, M1-M3 cortex): No acute insult identifiable. Extensive old infarct on the left. - Supraganglionic infarction (M4-M6 cortex): A old infarct on the left. Total score (0-10 with 10 being normal): No acute lesion suspected. Difficult to apply given the extensive old left MCA infarction. IMPRESSION: 1. No acute finding. No apparent change since March of last year. Old infarction in the left MCA territory. 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 2. ASPECTS is difficult to apply given the extensive old left MCA stroke. 3. These results were communicated to Dr. Pearlean Brownie at 12:20 pmon 4/15/2020by text page via the [facility] messaging system. Electronically Signed   By: Paulina Fusi M.D.   On: 07/10/2018 12:20    Procedures Procedures (including critical care time)  Medications Ordered in ED Medications  sodium chloride flush (NS) 0.9 % injection 3 mL (has no administration in time range)  iohexol (OMNIPAQUE) 350 MG/ML injection 75 mL (75 mLs Intravenous Contrast Given 07/10/18 1221)     Initial Impression / Assessment and Plan / ED Course  I have reviewed the triage vital signs and the nursing notes.  Pertinent labs & imaging results that were available during my care of the patient were reviewed by me and considered in my medical decision making (see chart for details).        Patient presents as a code stroke called out in the field via EMS.  Apparently patient was exhibiting aphasia and right-sided weakness.  Myself and neurology evaluated the patient at the bridge on arrival at which point the patient appeared confused but with no unilateral weakness.  CTA of the head and neck shows atherosclerotic changes of multiple vessels but no acute intracranial large or  medium vessel occlusion.  Neurology recommends hospitalist admission for evaluation of altered mental status and they will follow the  patient while in the hospital.  They also recommended obtaining EEG to rule out possible seizure-like activity.  On my assessment the patient has no significant complaints other than some mild lower abdominal discomfort.  He is afebrile, in A. fib which is his baseline, somewhat hypertensive.  Vital signs otherwise stable.  No peritoneal signs on examination of the abdomen.  He is noted to have some urinary frequency but UA does not show any evidence of UTI or nephrolithiasis.  Remainder of labs reviewed by me show no leukocytosis, no anemia, no metabolic derangements.  Chest x-ray shows no evidence of acute cardiopulmonary abnormalities including pleural effusion or pneumonia.  Spoke with Dr. Artist PaisYoo with family medicine service who agrees to assume care of patient and bring him into the hospital for further evaluation and management.  Discussed with Dr. Pilar PlateBero who agrees with assessment and plan at this time.  Final Clinical Impressions(s) / ED Diagnoses   Final diagnoses:  Altered mental status, unspecified altered mental status type    ED Discharge Orders    None       Jeanie SewerFawze, Ximena Todaro A, PA-C 07/10/18 1626  12:20 PM Addendum: Note addended to reflect chief complaint.     Jeanie SewerFawze, Jesicca Dipierro A, PA-C 07/11/18 1220    Sabas SousBero, Michael M, MD 07/11/18 1226

## 2018-07-10 NOTE — ED Triage Notes (Signed)
From home went to bed last night and woke up this am with  Rt sided weakness and not being able to speak well , pt has iv left ac is on eliquis cbg 180 ,has afib irreg rhythm

## 2018-07-10 NOTE — Telephone Encounter (Signed)
Daughter in law of patient called [name] to try and get an appointment, she stated that patient was unable to move half of his body and was unable to speak. She stated that it appeared he was having a stroke. Notified her that he should be taken to the emergency department.

## 2018-07-10 NOTE — Code Documentation (Addendum)
85yo male arriving to [facility] via [facility] at 1202. Patient from home where he was LKW yesterday at 64. At 2200 he told his family that something was not right, however, he went to sleep. He woke up this morning with difficulty speaking and EMS was called. EMS assessed right sided weakness and aphasia and activated a code stroke. Patient on Eliquis, last dose last night. Stroke team at the bedside on patient arrival. Labs attempted and patient cleared for CT by Dr. Pilar Plate. Patient to CT with team. CT completed. NIHSS 6, see documentation for details and code stroke times. Patient verbal but with expressive and receptive aphasia on exam. Patient without drift to right arm and leg but does demonstrate weakness. Patient is outside the window and contraindicated for treatment with tPA. CTA completed which was negative for LVO. Patient is not a candidate for endovascular intervention. Patient to be admitted for workup. Bedside handoff with ED RN Nehemiah Settle.

## 2018-07-10 NOTE — ED Notes (Signed)
Pt in MRI at this time 

## 2018-07-10 NOTE — Progress Notes (Signed)
Pt arrived to 3w. A&O x3. Skin intact. Pt states no pain. Tele has been placed. Pt oriented to room. Call bell within reach. Nurse will continue to monitor. Tyron Russell Andrez [place]

## 2018-07-10 NOTE — ED Notes (Signed)
ED TO INPATIENT HANDOFF REPORT  ED Nurse Name and Phone #:  [phone]  S Name/Age/Gender [name] 85 y.o. male Room/Bed: RESUSC/RESUSC  Code Status   Code Status: Prior  Home/SNF/Other Home Patient oriented to: self, place, time and situation Is this baseline? Yes   Triage Complete: Triage complete  Chief Complaint code stroke  Triage Note From home went to bed last night and woke up this am with  Rt sided weakness and not being able to speak well , pt has iv left ac is on eliquis cbg 180 ,has afib irreg rhythm   Allergies No Known Allergies  Level of Care/Admitting Diagnosis ED Disposition    ED Disposition Condition Comment   Admit  Hospital Area: [facility] [[id number]]  Level of Care: Telemetry Medical [104]  Diagnosis: Stroke [facility]) [[id number]]  Admitting Physician: Leland Her [[id number]]  Attending Physician: Moses Manners [[id number]]  Estimated length of stay: past midnight tomorrow  Certification:: I certify this patient will need inpatient services for at least 2 midnights  Possible Covid Disease Patient Isolation: N/A  PT Class (Do Not Modify): Inpatient [101]  PT Acc Code (Do Not Modify): Private [1]       B Medical/Surgery History Past Medical History:  Diagnosis Date  . Atrial fibrillation (HCC)   . COPD (chronic obstructive pulmonary disease) (HCC)   . CVA (cerebral infarction)   . Diabetes mellitus without complication (HCC)   . Essential hypertension, benign   . Hard of hearing    Past Surgical History:  Procedure Laterality Date  . HERNIA REPAIR    . LEFT HEART CATHETERIZATION WITH CORONARY ANGIOGRAM N/A 03/28/2013   Procedure: LEFT HEART CATHETERIZATION WITH CORONARY ANGIOGRAM;  Surgeon: Peter M Swaziland, MD;  Location: [facility] CATH LAB;  Service: Cardiovascular;  Laterality: N/A;     A IV Location/Drains/Wounds Patient Lines/Drains/Airways Status   Active Line/Drains/Airways    Name:   Placement date:   Placement time:   Site:    Days:   Peripheral IV 07/10/18 Left Antecubital   07/10/18    1232    Antecubital   less than 1   Peripheral IV (Ped) 04/02/18 Antecubital   04/02/18    0855     99          Intake/Output Last 24 hours No intake or output data in the 24 hours ending 07/10/18 1730  Labs/Imaging Results for orders placed or performed during the hospital encounter of 07/10/18 (from the past 48 hour(s))  CBG monitoring, ED     Status: Abnormal   Collection Time: 07/10/18 12:04 PM  Result Value Ref Range   Glucose-Capillary 131 (H) 70 - 99 mg/dL  Hemoglobin T0V     Status: Abnormal   Collection Time: 07/10/18 12:04 PM  Result Value Ref Range   Hgb A1c MFr Bld 7.9 (H) 4.8 - 5.6 %    Comment: (NOTE) Pre diabetes:          5.7%-6.4% Diabetes:              >6.4% Glycemic control for   <7.0% adults with diabetes    Mean Plasma Glucose 180.03 mg/dL    Comment: Performed at [facility], [address], [place], [place] [address]  Urinalysis, Routine w reflex microscopic     Status: Abnormal   Collection Time: 07/10/18  1:33 PM  Result Value Ref Range   Color, Urine COLORLESS (A) YELLOW   APPearance CLEAR CLEAR  Specific Gravity, Urine 1.011 1.005 - 1.030   pH 8.0 5.0 - 8.0   Glucose, UA NEGATIVE NEGATIVE mg/dL   Hgb urine dipstick NEGATIVE NEGATIVE   Bilirubin Urine NEGATIVE NEGATIVE   Ketones, ur NEGATIVE NEGATIVE mg/dL   Protein, ur NEGATIVE NEGATIVE mg/dL   Nitrite NEGATIVE NEGATIVE   Leukocytes,Ua NEGATIVE NEGATIVE    Comment: Performed at [facility], [address] [place], [place] [address]  Protime-INR     Status: None   Collection Time: 07/10/18  1:44 PM  Result Value Ref Range   Prothrombin Time 15.1 11.4 - 15.2 seconds   INR 1.2 0.8 - 1.2    Comment: (NOTE) INR goal varies based on device and disease states. Performed at [facility], [address] [place], [place] [address]   APTT     Status: Abnormal   Collection Time: 07/10/18  1:44 PM  Result Value  Ref Range   aPTT 39 (H) 24 - 36 seconds    Comment:        IF BASELINE aPTT IS ELEVATED, SUGGEST PATIENT RISK ASSESSMENT BE USED TO DETERMINE APPROPRIATE ANTICOAGULANT THERAPY. Performed at [facility], [address] [place], [place] [address]   CBC     Status: None   Collection Time: 07/10/18  1:44 PM  Result Value Ref Range   WBC 6.4 4.0 - 10.5 K/uL   RBC 5.01 4.22 - 5.81 MIL/uL   Hemoglobin 14.0 13.0 - 17.0 g/dL   HCT 82.9 56.2 - 13.0 %   MCV 89.6 80.0 - 100.0 fL   MCH 27.9 26.0 - 34.0 pg   MCHC 31.2 30.0 - 36.0 g/dL   RDW 86.5 78.4 - 69.6 %   Platelets 170 150 - 400 K/uL   nRBC 0.0 0.0 - 0.2 %    Comment: Performed at [facility] Lab, [address] [place], [place] [address]  Differential     Status: None   Collection Time: 07/10/18  1:44 PM  Result Value Ref Range   Neutrophils Relative % 65 %   Neutro Abs 4.2 1.7 - 7.7 K/uL   Lymphocytes Relative 19 %   Lymphs Abs 1.2 0.7 - 4.0 K/uL   Monocytes Relative 8 %   Monocytes Absolute 0.5 0.1 - 1.0 K/uL   Eosinophils Relative 6 %   Eosinophils Absolute 0.4 0.0 - 0.5 K/uL   Basophils Relative 1 %   Basophils Absolute 0.1 0.0 - 0.1 K/uL   Immature Granulocytes 1 %   Abs Immature Granulocytes 0.03 0.00 - 0.07 K/uL    Comment: Performed at [facility], [address], [place], [place] [address]  Comprehensive metabolic panel     Status: Abnormal   Collection Time: 07/10/18  1:44 PM  Result Value Ref Range   Sodium 142 135 - 145 mmol/L   Potassium 4.1 3.5 - 5.1 mmol/L   Chloride 106 98 - 111 mmol/L   CO2 24 22 - 32 mmol/L   Glucose, Bld 123 (H) 70 - 99 mg/dL   BUN 15 8 - 23 mg/dL   Creatinine, Ser 4.01 0.61 - 1.24 mg/dL   Calcium 9.6 8.9 - 02.7 mg/dL   Total Protein 6.4 (L) 6.5 - 8.1 g/dL   Albumin 3.8 3.5 - 5.0 g/dL   AST 24 15 - 41 U/L   ALT 14 0 - 44 U/L   Alkaline Phosphatase 48 38 - 126 U/L   Total Bilirubin 0.9 0.3 -  1.2 mg/dL   GFR calc non Af Amer >60 >60 mL/min   GFR calc Af Amer >60  >60 mL/min   Anion gap 12 5 - 15    Comment: Performed at [facility], [address] [place], [place] [address]  Ammonia     Status: None   Collection Time: 07/10/18  1:44 PM  Result Value Ref Range   Ammonia 16 9 - 35 umol/L    Comment: Performed at [facility], [address] [place], [place] [address]  Lipid panel     Status: Abnormal   Collection Time: 07/10/18  1:44 PM  Result Value Ref Range   Cholesterol 226 (H) 0 - 200 mg/dL   Triglycerides 098165 (H) <150 mg/dL   HDL 43 >11>40 mg/dL   Total CHOL/HDL Ratio 5.3 RATIO   VLDL 33 0 - 40 mg/dL   LDL Cholesterol 914150 (H) 0 - 99 mg/dL    Comment:        Total Cholesterol/HDL:CHD Risk Coronary Heart Disease Risk Table                     Men   Women  1/2 Average Risk   3.4   3.3  Average Risk       5.0   4.4  2 X Average Risk   9.6   7.1  3 X Average Risk  23.4   11.0        Use the calculated Patient Ratio above and the CHD Risk Table to determine the patient's CHD Risk.        ATP III CLASSIFICATION (LDL):  <100     mg/dL   Optimal  [id number]  mg/dL   Near or Above                    Optimal  130-159  mg/dL   Borderline  [phone]  mg/dL   High  >578>190     mg/dL   Very High Performed at [facility], [address] [place], [place] [address]   I-stat Creatinine, ED     Status: None   Collection Time: 07/10/18  1:56 PM  Result Value Ref Range   Creatinine, Ser 1.00 0.61 - 1.24 mg/dL   Ct Angio Head W Or Wo Contrast  Result Date: 07/10/2018 CLINICAL DATA:  Slurred speech EXAM: CT ANGIOGRAPHY HEAD AND NECK TECHNIQUE: Multidetector CT imaging of the head and neck was performed using the standard protocol during bolus administration of intravenous contrast. Multiplanar CT image reconstructions and MIPs were obtained to evaluate the vascular anatomy. Carotid stenosis measurements (when applicable) are obtained utilizing NASCET criteria, using the distal internal carotid diameter as the denominator. CONTRAST:  75mL  OMNIPAQUE IOHEXOL 350 MG/ML SOLN COMPARISON:  Head CT same day FINDINGS: CTA NECK FINDINGS Aortic arch: Atherosclerotic irregularity of the aortic arch. Branching pattern is normal with left vertebral artery arising from the arch. No origin stenosis. Right carotid system: Common carotid artery widely patent to the bifurcation region. Calcified plaque at the carotid bifurcation and ICA bulb. No stenosis. Cervical ICA widely patent beyond that. Left carotid system: Common carotid artery widely patent to the bifurcation. Soft and calcified plaque at the carotid bifurcation and ICA bulb. Minimal diameter at the distal bulb is 4.5 mm. Compared to a more distal cervical ICA diameter of 5 mm, this indicates a 10% stenosis. Vertebral arteries: Both vertebral arteries are widely patent. As noted above,  the left vertebral artery arises from the arch. Skeleton: Ordinary cervical spondylosis. Other neck: No mass or lymphadenopathy. Upper chest: Emphysema and pulmonary scarring.  No active process. Review of the MIP images confirms the above findings CTA HEAD FINDINGS Anterior circulation: Right internal carotid artery is patent through the siphon region. There is atherosclerotic calcification but no narrowing greater than 30%. Right ICA supplies primarily the right middle cerebral artery territory. There is a diminutive A1 segment. No large or medium vessel occlusion identified. On the left, the ICA is patent through the siphon region with calcification but no stenosis greater than 30%. Dominant A1 segment is widely patent. Left M1 segment is patent and normal. Superior division of the left middle cerebral artery is presumably chronically occluded, serving the region of old infarction. No sign of acute embolic occlusion. Posterior circulation: Both vertebral arteries are patent through the foramen magnum to the basilar. No basilar stenosis. Posterior circulation branch vessels are patent. Venous sinuses: Patent and normal.  Anatomic variants: None significant otherwise. Delayed phase: No abnormal enhancement. Review of the MIP images confirms the above findings IMPRESSION: Atherosclerotic change of the aorta with marked irregularity. Atherosclerotic change at both carotid bifurcations. No stenosis on the right. 10% stenosis of the distal bulb on the left. No acute intracranial large or medium vessel occlusion. 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 Electronically Signed   By: Paulina Fusi M.D.   On: 07/10/2018 12:32   Dg Chest 2 View  Result Date: 07/10/2018 CLINICAL DATA:  Altered mental status.  Stroke. EXAM: CHEST - 2 VIEW COMPARISON:  06/05/2018 FINDINGS: Artifact overlies the chest. Heart size is at the upper limits of normal. There is aortic atherosclerosis. The lungs are clear except for mild scarring. No infiltrate, collapse or effusion. IMPRESSION: No active cardiopulmonary disease. Electronically Signed   By: Paulina Fusi M.D.   On: 07/10/2018 13:27   Ct Angio Neck W Or Wo Contrast  Result Date: 07/10/2018 CLINICAL DATA:  Slurred speech EXAM: CT ANGIOGRAPHY HEAD AND NECK TECHNIQUE: Multidetector CT imaging of the head and neck was performed using the standard protocol during bolus administration of intravenous contrast. Multiplanar CT image reconstructions and MIPs were obtained to evaluate the vascular anatomy. Carotid stenosis measurements (when applicable) are obtained utilizing NASCET criteria, using the distal internal carotid diameter as the denominator. CONTRAST:  75mL OMNIPAQUE IOHEXOL 350 MG/ML SOLN COMPARISON:  Head CT same day FINDINGS: CTA NECK FINDINGS Aortic arch: Atherosclerotic irregularity of the aortic arch. Branching pattern is normal with left vertebral artery arising from the arch. No origin stenosis. Right carotid system: Common carotid artery widely patent to the bifurcation region. Calcified plaque  at the carotid bifurcation and ICA bulb. No stenosis. Cervical ICA widely patent beyond that. Left carotid system: Common carotid artery widely patent to the bifurcation. Soft and calcified plaque at the carotid bifurcation and ICA bulb. Minimal diameter at the distal bulb is 4.5 mm. Compared to a more distal cervical ICA diameter of 5 mm, this indicates a 10% stenosis. Vertebral arteries: Both vertebral arteries are widely patent. As noted above, the left vertebral artery arises from the arch. Skeleton: Ordinary cervical spondylosis. Other neck: No mass or lymphadenopathy. Upper chest: Emphysema and pulmonary scarring.  No active process. Review of the MIP images confirms the above findings CTA HEAD FINDINGS Anterior circulation: Right internal carotid artery is patent through the siphon region. There is atherosclerotic calcification but  no narrowing greater than 30%. Right ICA supplies primarily the right middle cerebral artery territory. There is a diminutive A1 segment. No large or medium vessel occlusion identified. On the left, the ICA is patent through the siphon region with calcification but no stenosis greater than 30%. Dominant A1 segment is widely patent. Left M1 segment is patent and normal. Superior division of the left middle cerebral artery is presumably chronically occluded, serving the region of old infarction. No sign of acute embolic occlusion. Posterior circulation: Both vertebral arteries are patent through the foramen magnum to the basilar. No basilar stenosis. Posterior circulation branch vessels are patent. Venous sinuses: Patent and normal. Anatomic variants: None significant otherwise. Delayed phase: No abnormal enhancement. Review of the MIP images confirms the above findings IMPRESSION: Atherosclerotic change of the aorta with marked irregularity. Atherosclerotic change at both carotid bifurcations. No stenosis on the right. 10% stenosis of the distal bulb on the left. No acute  intracranial large or medium vessel occlusion. 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 Electronically Signed   By: Paulina Fusi M.D.   On: 07/10/2018 12:32   Mr Brain Wo Contrast  Result Date: 07/10/2018 CLINICAL DATA:  Stroke follow-up EXAM: MRI HEAD WITHOUT CONTRAST TECHNIQUE: Multiplanar, multiecho pulse sequences of the brain and surrounding structures were obtained without intravenous contrast. COMPARISON:  CTA head neck 07/10/2018 FINDINGS: BRAIN: Small focus of abnormal diffusion restriction within the left pons. No other diffusion abnormality. The midline structures are normal. Old posterior left MCA territory infarct. Multifocal white matter hyperintensity, most commonly due to chronic ischemic microangiopathy. Generalized atrophy without lobar predilection. Susceptibility-sensitive sequences show no chronic microhemorrhage or superficial siderosis. No mass lesion. VASCULAR: The major intracranial arterial and venous sinus flow voids are normal. SKULL AND UPPER CERVICAL SPINE: Calvarial bone marrow signal is normal. There is no skull base mass. Visualized upper cervical spine and soft tissues are normal. SINUSES/ORBITS: No fluid levels or advanced mucosal thickening. No mastoid or middle ear effusion. The orbits are normal. IMPRESSION: 1. Small acute or early subacute infarct the left pons, in keeping with reported right-sided weakness. No acute hemorrhage. 2. Old posterior left MCA territory infarct encephalomalacia. 3. Generalized atrophy. Electronically Signed   By: Deatra Robinson M.D.   On: 07/10/2018 16:04   Ct Head Code Stroke Wo Contrast  Result Date: 07/10/2018 CLINICAL DATA:  Code stroke.  Slurred speech EXAM: CT HEAD WITHOUT CONTRAST TECHNIQUE: Contiguous axial images were obtained from the base of the skull through the vertex without intravenous contrast. COMPARISON:  06/13/2017 FINDINGS: Brain:  No brainstem abnormality is seen. Old left cerebellar infarction. Old infarction in the left MCA territory with atrophy, encephalomalacia and gliosis of the deep insula, posterior temporal lobe and parietal lobe. No sign of acute infarction, mass lesion, hemorrhage, hydrocephalus or extra-axial collection Vascular: There is atherosclerotic calcification of the major vessels at the base of the brain. Skull: Negative Sinuses/Orbits: Clear/normal Other: Left parotid mass as seen previously. ASPECTS [facility] Stroke Program Early CT Score) - Ganglionic level infarction (caudate, lentiform nuclei, internal capsule, insula, M1-M3 cortex): No acute insult identifiable. Extensive old infarct on the left. - Supraganglionic infarction (M4-M6 cortex): A old infarct on the left. Total score (0-10 with 10 being normal): No acute lesion suspected. Difficult to apply given the extensive old left MCA infarction. IMPRESSION: 1. No acute finding. No apparent change since March of last year. Old infarction in the left MCA  territory. 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 2. ASPECTS is difficult to apply given the extensive old left MCA stroke. 3. These results were communicated to Dr. Pearlean Brownie at 12:20 pmon 4/15/2020by text page via the [facility] messaging system. Electronically Signed   By: Paulina Fusi M.D.   On: 07/10/2018 12:20    Pending Labs Unresulted Labs (From admission, onward)    Start     Ordered   07/10/18 1228  Urine culture  ONCE - STAT,   STAT     07/10/18 1228          Vitals/Pain Today's Vitals   07/10/18 1500 07/10/18 1630 07/10/18 1645 07/10/18 1700  BP: (!) 162/81 (!) 141/71 (!) 155/64 (!) 147/62  Pulse: (!) 107 73 69 66  Resp: (!) 25 20 (!) 21 (!) 22  Temp:      TempSrc:      SpO2: 93% 93% 91% 92%  PainSc:        Isolation Precautions No active isolations  Medications Medications  sodium chloride flush (NS) 0.9 % injection 3 mL (has no administration in time range)  iohexol (OMNIPAQUE) 350 MG/ML  injection 75 mL (75 mLs Intravenous Contrast Given 07/10/18 1221)    Mobility walks with person assist     Focused Assessments Cardiac Assessment Handoff:    Lab Results  Component Value Date   CKTOTAL 65 03/06/2007   CKMB 1.6 03/06/2007   TROPONINI <0.03 03/30/2018   Lab Results  Component Value Date   DDIMER 0.57 (H) 02/02/2018   Does the Patient currently have chest pain? No  , Neuro Assessment Handoff:  Swallow screen pass? Yes    NIH Stroke Scale ( + Modified Stroke Scale Criteria)  Interval: Initial Level of Consciousness (1a.)   : Alert, keenly responsive LOC Questions (1b. )   +: Answers neither question correctly LOC Commands (1c. )   + : Performs neither task correctly Best Gaze (2. )  +: Normal Visual (3. )  +: No visual loss Facial Palsy (4. )    : Minor paralysis Motor Arm, Left (5a. )   +: No drift Motor Arm, Right (5b. )   +: No drift Motor Leg, Left (6a. )   +: No drift Motor Leg, Right (6b. )   +: No drift Limb Ataxia (7. ): Absent Sensory (8. )   +: Normal, no sensory loss Best Language (9. )   +: Mild-to-moderate aphasia Dysarthria (10. ): Normal Extinction/Inattention (11.)   +: No Abnormality Modified SS Total  +: 5 Complete NIHSS TOTAL: 6 Last date known well: 07/09/18 Last time known well: 2000 Neuro Assessment:   Neuro Checks:   Initial (07/10/18 1205)  Last Documented NIHSS Modified Score: 5 (07/10/18 1205) Has TPA been given? No If patient is a Neuro Trauma and patient is going to OR before floor call report to 4N Charge nurse: [phone] or [phone]  , Pulmonary Assessment Handoff:  Lung sounds:            R Recommendations: See Admitting Provider Note  Report given to:   Additional Notes:

## 2018-07-10 NOTE — Procedures (Signed)
History: 85 year old male being evaluated for possible seizure  Sedation: None  Technique: This is a 21 channel routine scalp EEG performed at the bedside with bipolar and monopolar montages arranged in accordance to the international 10/20 system of electrode placement. One channel was dedicated to EKG recording.    Background: The background consists of intermixed alpha and beta activities. There is a well defined posterior dominant rhythm of 8-9 hz that attenuates with eye opening. Sleep is recorded with normal appearing structures.   Photic stimulation: Physiologic driving is performed  EEG Abnormalities: None  Clinical Interpretation: This normal EEG is recorded in the waking and sleep state. There was no seizure or seizure predisposition recorded on this study. Please note that lack of epileptiform activity on EEG does not preclude the possibility of epilepsy.   Ritta Slot, MD [facility] [phone]  If 7pm- 7am, please page neurology on call as listed in AMION.

## 2018-07-10 NOTE — H&P (Signed)
[facility] Admission History and Physical Team 2 Service Pager: [phone]  Patient name: [name] Medical record number: [id number] Date of birth: April 22, 1927 Age: 85 y.o. Gender: male  Primary Care Provider: Bing Neighbors, FNP Consultants: neurology  Code Status: DNR/DNI  Preferred Emergency Contact: son [name]  Chief Complaint: R sided weakness and speech difficulties  Assessment and Plan: [name] is a 85 y.o. male presenting with R sided weakness and speech difficulties. PMH is significant for Afib on eliquis, COPD, DM, HTN, CAD, h/o CVA.   R sided weakness and expressive aphasia 2/2 L pons infarct. Patient last known normal around 10pm last night. MRI brain showed a small acute or early subacute infarct in the L pons in keeping with R sided weakness. No hemorrhage.  No new vessel occlusion seen on CTA head/neck. R sided weakness appears improved as has 5/5 strength bilaterally but patient has difficulty with finger-to-nose. He has continued worsening of his expressive aphasia compared to baseline. - admit to inpatient, attending Dr. Leveda Martinez - Neurology Dr. Pearlean Martinez recommending continuing eliquis. No need for ASA or plavix. Recommending to check EEG. Aggressive risk factor modification.  - Echo today shows EF >65% with mild asymmetric left ventricular hypertrophy of the basal anteroseptal wall. Left ventricular diastolic Doppler parameters are consistent with impaired relaxation. - permissive HTN - neuro checks - increase atorvastatin from 40mg  to 80mg  daily - PT/OT/SLP consulted   AFib. Rate controlled. EKG on admit showing afib without RVR and old RBBB - continue eliquis  Diabetes. a1c 7.9 - continue home metformin - monitor CBGs with meals  Hyperlipidemia. LDL 150 - increase home atorvastatin 40mg  qd to 80mg  qd  HTN. BP on admit 147/62.  - holding home meds for permissive HTN  COPD. At home on symbicort - dulera while  admitted - prn albuterol  Hypothyroidism. Had recent dose adjustment of levothyroxine 1 month ago by PCP. - continue home levothyroxine - recheck TSH  FEN/GI: NPO until passes bedside swallow Prophylaxis: eliquis  Disposition: admit to inpatient  History of Present Illness:  [name] is a 85 y.o. male presenting with R sided weakness and speech difficulties.  Patient is unreliable historian due to speech difficulties.  History taken mostly from chart review and phone conversation with son [name].  Patient's son states that has had longstanding word finding difficulties ever since his first stroke 20 years ago.  He at baseline frequently knows what he wants to say but has trouble saying it.  Starting last night he had acute worsening with more confusion and more word finding difficulty than normal and was having trouble walking with noticeable right-sided weakness.  He is right-handed at baseline and can normally walk unassisted however he was having difficulty even walking to the bathroom.  Patient states that he "does not feel right" and had some belly pain that resolved after having a bowel movement.  He has no chest pain, shortness of breath, blurry vision, double vision, numbness.  Review Of Systems: Per HPI with the following additions:   Review of Systems  Constitutional: Negative for fever.  Eyes: Negative for blurred vision and double vision.  Respiratory: Negative for cough and shortness of breath.   Cardiovascular: Negative for chest pain and palpitations.  Gastrointestinal: Negative for abdominal pain.  Neurological: Positive for speech change and focal weakness. Negative for seizures.    Patient Active Problem List   Diagnosis Date Noted  . Requires supplemental oxygen 06/05/2018  . Chest  pain 03/29/2018  . Atrial fibrillation (HCC) 05/20/2015  . RBBB 05/20/2015  . Prolonged Q-T interval on ECG 05/20/2015  . CAD (coronary artery disease) 05/20/2015  . History of  CVA (cerebrovascular accident) 05/20/2015  . Atrial fibrillation with RVR (HCC)   . Bronchiectasis with acute exacerbation (HCC)   . Unstable angina (HCC) 03/27/2013  . COPD exacerbation (HCC) 03/25/2013  . Diabetes mellitus type II, uncontrolled (HCC) 03/25/2013  . Hypothyroidism 03/25/2013  . BPH (benign prostatic hyperplasia) 03/25/2013  . HTN (hypertension) 03/25/2013    Past Medical History: Past Medical History:  Diagnosis Date  . Atrial fibrillation (HCC)   . COPD (chronic obstructive pulmonary disease) (HCC)   . CVA (cerebral infarction)   . Diabetes mellitus without complication (HCC)   . Essential hypertension, benign   . Hard of hearing     Past Surgical History: Past Surgical History:  Procedure Laterality Date  . HERNIA REPAIR    . LEFT HEART CATHETERIZATION WITH CORONARY ANGIOGRAM N/A 03/28/2013   Procedure: LEFT HEART CATHETERIZATION WITH CORONARY ANGIOGRAM;  Surgeon: Peter M SwazilandJordan, MD;  Location: [facility] CATH LAB;  Service: Cardiovascular;  Laterality: N/A;    Social History: Social History   Tobacco Use  . Smoking status: Former Smoker    Types: Cigarettes  . Smokeless tobacco: Former Engineer, waterUser  Substance Use Topics  . Alcohol use: No  . Drug use: No   Additional social history: Lives at home with son and son's fiancee. Has 24h supervision.  Please also refer to relevant sections of EMR.  Family History: Family History  Problem Relation Age of Onset  . CAD Son   . Skin cancer Brother     Allergies and Medications: No Known Allergies No current facility-administered medications on file prior to encounter.    Current Outpatient Medications on File Prior to Encounter  Medication Sig Dispense Refill  . albuterol (PROVENTIL HFA;VENTOLIN HFA) 108 (90 Base) MCG/ACT inhaler Inhale 2 puffs into the lungs every 4 (four) hours as needed for wheezing or shortness of breath. 1 Inhaler 0  . amLODipine (NORVASC) 5 MG tablet Take 5 mg by mouth daily.    Marland Kitchen. apixaban  (ELIQUIS) 5 MG TABS tablet Take 1 tablet (5 mg total) by mouth 2 (two) times daily. 60 tablet 0  . aspirin 325 MG EC tablet Take 325 mg by mouth as needed (for headaches).    . Aspirin-Salicylamide-Caffeine (BC HEADACHE POWDER PO) Take 1 packet by mouth as needed (for headaches).    Marland Kitchen. atorvastatin (LIPITOR) 40 MG tablet Take 1 tablet (40 mg total) by mouth daily at 6 PM. 30 tablet 0  . benzonatate (TESSALON) 100 MG capsule Take 1-2 capsules (100-200 mg total) by mouth 3 (three) times daily as needed for cough. 60 capsule 1  . brimonidine (ALPHAGAN) 0.15 % ophthalmic solution Place 1 drop into both eyes 2 (two) times daily.     . budesonide-formoterol (SYMBICORT) 80-4.5 MCG/ACT inhaler Inhale 2 puffs into the lungs 2 (two) times daily.    . Carboxymethylcellulose Sodium 0.25 % SOLN Apply 2 drops to eye 4 (four) times daily as needed (for dry eyes).     Marland Kitchen. ipratropium-albuterol (DUONEB) 0.5-2.5 (3) MG/3ML SOLN Take 3 mLs by nebulization 4 (four) times daily.    . finasteride (PROSCAR) 5 MG tablet Take 5 mg by mouth daily.    . hydrochlorothiazide (HYDRODIURIL) 25 MG tablet Take 25 mg by mouth daily.     . Ipratropium-Albuterol (COMBIVENT RESPIMAT) 20-100 MCG/ACT AERS respimat Inhale 1  puff into the lungs every 6 (six) hours as needed for wheezing.     . levalbuterol (XOPENEX) 0.63 MG/3ML nebulizer solution Take 3 mLs (0.63 mg total) by nebulization every 6 (six) hours as needed for wheezing or shortness of breath. 3 mL 12  . levofloxacin (LEVAQUIN) 500 MG tablet Take 1 tablet (500 mg total) by mouth daily. 7 tablet 0  . levothyroxine (SYNTHROID, LEVOTHROID) 25 MCG tablet Take 2 tablets (50 mcg total) by mouth daily before breakfast. 60 tablet 3  . lisinopril (PRINIVIL,ZESTRIL) 40 MG tablet Take 40 mg by mouth daily.    . metFORMIN (GLUCOPHAGE) 500 MG tablet Take 500 mg by mouth 2 (two) times daily with a meal.     . metoprolol (LOPRESSOR) 50 MG tablet Take 25 mg by mouth 2 (two) times daily.    Marland Kitchen  MUCINEX 600 MG 12 hr tablet     . Multiple Vitamins-Minerals (MULTIVITAMIN WITH MINERALS) tablet Take 1 tablet by mouth daily.    . nitroGLYCERIN (NITROSTAT) 0.4 MG SL tablet Place 0.4 mg under the tongue every 5 (five) minutes as needed for chest pain. Dissolve one tablet under the tongue every 5 minutes as needed for chest pain. Repeat every 5 minutes if needed for a total of 3 tablets in 15 minutes. If no relief, call 911.    . olopatadine (PATANOL) 0.1 % ophthalmic solution Place 1 drop into both eyes 2 (two) times daily. 5 mL 12  . omeprazole (PRILOSEC) 20 MG capsule Take 20 mg by mouth daily. .    . ranitidine (ZANTAC) 300 MG tablet Take 300 mg by mouth at bedtime. Take one tablet by mouth at bedtime in addition to omeprazole for heartburn/chest pain.    Marland Kitchen senna-docusate (SENOKOT-S) 8.6-50 MG tablet Take 2 tablets by mouth at bedtime. 60 tablet 3  . Skin Protectants, Misc. (EUCERIN) cream Apply 1 application topically See admin instructions. Apply small amount to affected area twice a day.      Objective: BP (!) 162/81   Pulse (!) 107   Temp 97.9 F (36.6 C) (Oral)   Resp (!) 25   SpO2 93%  Exam: General: laying in bed comfortably, in NAD Eyes: PERRL, EOMI ENTM: dry MMM, tongue midline, uvula midline Neck: supple, normal ROM Cardiovascular: irregularly irregular, no murmurs Respiratory: CTAB, NWOB. No crackles Gastrointestinal: soft, nontender, nondistended, + bowel sounds MSK: moving all limbs equally Derm: warm and dry Neuro: alert. Oriented to self. Expressive aphasia with word finding difficulty. 5/5 strength and sensation intact throughout. Finger to nose slowed in R UE.   Labs and Imaging: CBC BMET  Recent Labs  Lab 07/10/18 1344  WBC 6.4  HGB 14.0  HCT 44.9  PLT 170   Recent Labs  Lab 07/10/18 1344 07/10/18 1356  NA 142  --   K 4.1  --   CL 106  --   CO2 24  --   BUN 15  --   CREATININE 1.06 1.00  GLUCOSE 123*  --   CALCIUM 9.6  --      Lipid Panel      Component Value Date/Time   CHOL 226 (H) 07/10/2018 1344   TRIG 165 (H) 07/10/2018 1344   HDL 43 07/10/2018 1344   CHOLHDL 5.3 07/10/2018 1344   VLDL 33 07/10/2018 1344   LDLCALC 150 (H) 07/10/2018 1344    Urinalysis    Component Value Date/Time   COLORURINE COLORLESS (A) 07/10/2018 1333   APPEARANCEUR CLEAR 07/10/2018 1333  LABSPEC 1.011 07/10/2018 1333   PHURINE 8.0 07/10/2018 1333   GLUCOSEU NEGATIVE 07/10/2018 1333   HGBUR NEGATIVE 07/10/2018 1333   BILIRUBINUR NEGATIVE 07/10/2018 1333   KETONESUR NEGATIVE 07/10/2018 1333   PROTEINUR NEGATIVE 07/10/2018 1333   UROBILINOGEN 1.0 10/28/2014 1628   NITRITE NEGATIVE 07/10/2018 1333   LEUKOCYTESUR NEGATIVE 07/10/2018 1333     Ct Angio Head W Or Wo Contrast  Result Date: 07/10/2018 CLINICAL DATA:  Slurred speech EXAM: CT ANGIOGRAPHY HEAD AND NECK TECHNIQUE: Multidetector CT imaging of the head and neck was performed using the standard protocol during bolus administration of intravenous contrast. Multiplanar CT image reconstructions and MIPs were obtained to evaluate the vascular anatomy. Carotid stenosis measurements (when applicable) are obtained utilizing NASCET criteria, using the distal internal carotid diameter as the denominator. CONTRAST:  75mL OMNIPAQUE IOHEXOL 350 MG/ML SOLN COMPARISON:  Head CT same day FINDINGS: CTA NECK FINDINGS Aortic arch: Atherosclerotic irregularity of the aortic arch. Branching pattern is normal with left vertebral artery arising from the arch. No origin stenosis. Right carotid system: Common carotid artery widely patent to the bifurcation region. Calcified plaque at the carotid bifurcation and ICA bulb. No stenosis. Cervical ICA widely patent beyond that. Left carotid system: Common carotid artery widely patent to the bifurcation. Soft and calcified plaque at the carotid bifurcation and ICA bulb. Minimal diameter at the distal bulb is 4.5 mm. Compared to a more distal cervical ICA diameter of 5 mm, this  indicates a 10% stenosis. Vertebral arteries: Both vertebral arteries are widely patent. As noted above, the left vertebral artery arises from the arch. Skeleton: Ordinary cervical spondylosis. Other neck: No mass or lymphadenopathy. Upper chest: Emphysema and pulmonary scarring.  No active process. Review of the MIP images confirms the above findings CTA HEAD FINDINGS Anterior circulation: Right internal carotid artery is patent through the siphon region. There is atherosclerotic calcification but no narrowing greater than 30%. Right ICA supplies primarily the right middle cerebral artery territory. There is a diminutive A1 segment. No large or medium vessel occlusion identified. On the left, the ICA is patent through the siphon region with calcification but no stenosis greater than 30%. Dominant A1 segment is widely patent. Left M1 segment is patent and normal. Superior division of the left middle cerebral artery is presumably chronically occluded, serving the region of old infarction. No sign of acute embolic occlusion. Posterior circulation: Both vertebral arteries are patent through the foramen magnum to the basilar. No basilar stenosis. Posterior circulation branch vessels are patent. Venous sinuses: Patent and normal. Anatomic variants: None significant otherwise. Delayed phase: No abnormal enhancement. Review of the MIP images confirms the above findings IMPRESSION: Atherosclerotic change of the aorta with marked irregularity. Atherosclerotic change at both carotid bifurcations. No stenosis on the right. 10% stenosis of the distal bulb on the left. No acute intracranial large or medium vessel occlusion. 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 Electronically Signed   By: Paulina Fusi M.D.   On: 07/10/2018 12:32   Dg Chest 2 View  Result Date: 07/10/2018 CLINICAL DATA:  Altered mental status.  Stroke. EXAM: CHEST  - 2 VIEW COMPARISON:  06/05/2018 FINDINGS: Artifact overlies the chest. Heart size is at the upper limits of normal. There is aortic atherosclerosis. The lungs are clear except for mild scarring. No infiltrate, collapse or effusion. IMPRESSION: No active cardiopulmonary disease. Electronically Signed   By: Scherrie Bateman.D.  On: 07/10/2018 13:27   Ct Angio Neck W Or Wo Contrast  Result Date: 07/10/2018 CLINICAL DATA:  Slurred speech EXAM: CT ANGIOGRAPHY HEAD AND NECK TECHNIQUE: Multidetector CT imaging of the head and neck was performed using the standard protocol during bolus administration of intravenous contrast. Multiplanar CT image reconstructions and MIPs were obtained to evaluate the vascular anatomy. Carotid stenosis measurements (when applicable) are obtained utilizing NASCET criteria, using the distal internal carotid diameter as the denominator. CONTRAST:  25mL OMNIPAQUE IOHEXOL 350 MG/ML SOLN COMPARISON:  Head CT same day FINDINGS: CTA NECK FINDINGS Aortic arch: Atherosclerotic irregularity of the aortic arch. Branching pattern is normal with left vertebral artery arising from the arch. No origin stenosis. Right carotid system: Common carotid artery widely patent to the bifurcation region. Calcified plaque at the carotid bifurcation and ICA bulb. No stenosis. Cervical ICA widely patent beyond that. Left carotid system: Common carotid artery widely patent to the bifurcation. Soft and calcified plaque at the carotid bifurcation and ICA bulb. Minimal diameter at the distal bulb is 4.5 mm. Compared to a more distal cervical ICA diameter of 5 mm, this indicates a 10% stenosis. Vertebral arteries: Both vertebral arteries are widely patent. As noted above, the left vertebral artery arises from the arch. Skeleton: Ordinary cervical spondylosis. Other neck: No mass or lymphadenopathy. Upper chest: Emphysema and pulmonary scarring.  No active process. Review of the MIP images confirms the above findings CTA  HEAD FINDINGS Anterior circulation: Right internal carotid artery is patent through the siphon region. There is atherosclerotic calcification but no narrowing greater than 30%. Right ICA supplies primarily the right middle cerebral artery territory. There is a diminutive A1 segment. No large or medium vessel occlusion identified. On the left, the ICA is patent through the siphon region with calcification but no stenosis greater than 30%. Dominant A1 segment is widely patent. Left M1 segment is patent and normal. Superior division of the left middle cerebral artery is presumably chronically occluded, serving the region of old infarction. No sign of acute embolic occlusion. Posterior circulation: Both vertebral arteries are patent through the foramen magnum to the basilar. No basilar stenosis. Posterior circulation branch vessels are patent. Venous sinuses: Patent and normal. Anatomic variants: None significant otherwise. Delayed phase: No abnormal enhancement. Review of the MIP images confirms the above findings IMPRESSION: Atherosclerotic change of the aorta with marked irregularity. Atherosclerotic change at both carotid bifurcations. No stenosis on the right. 10% stenosis of the distal bulb on the left. No acute intracranial large or medium vessel occlusion. 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 Electronically Signed   By: Paulina Fusi M.D.   On: 07/10/2018 12:32   Mr Brain Wo Contrast  Result Date: 07/10/2018 CLINICAL DATA:  Stroke follow-up EXAM: MRI HEAD WITHOUT CONTRAST TECHNIQUE: Multiplanar, multiecho pulse sequences of the brain and surrounding structures were obtained without intravenous contrast. COMPARISON:  CTA head neck 07/10/2018 FINDINGS: BRAIN: Small focus of abnormal diffusion restriction within the left pons. No other diffusion abnormality. The midline structures are normal. Old posterior left MCA  territory infarct. Multifocal white matter hyperintensity, most commonly due to chronic ischemic microangiopathy. Generalized atrophy without lobar predilection. Susceptibility-sensitive sequences show no chronic microhemorrhage or superficial siderosis. No mass lesion. VASCULAR: The major intracranial arterial and venous sinus flow voids are normal. SKULL AND UPPER CERVICAL SPINE: Calvarial bone marrow signal is normal. There is no skull base mass. Visualized upper cervical spine  and soft tissues are normal. SINUSES/ORBITS: No fluid levels or advanced mucosal thickening. No mastoid or middle ear effusion. The orbits are normal. IMPRESSION: 1. Small acute or early subacute infarct the left pons, in keeping with reported right-sided weakness. No acute hemorrhage. 2. Old posterior left MCA territory infarct encephalomalacia. 3. Generalized atrophy. Electronically Signed   By: Deatra Robinson M.D.   On: 07/10/2018 16:04   Ct Head Code Stroke Wo Contrast  Result Date: 07/10/2018 CLINICAL DATA:  Code stroke.  Slurred speech EXAM: CT HEAD WITHOUT CONTRAST TECHNIQUE: Contiguous axial images were obtained from the base of the skull through the vertex without intravenous contrast. COMPARISON:  06/13/2017 FINDINGS: Brain: No brainstem abnormality is seen. Old left cerebellar infarction. Old infarction in the left MCA territory with atrophy, encephalomalacia and gliosis of the deep insula, posterior temporal lobe and parietal lobe. No sign of acute infarction, mass lesion, hemorrhage, hydrocephalus or extra-axial collection Vascular: There is atherosclerotic calcification of the major vessels at the base of the brain. Skull: Negative Sinuses/Orbits: Clear/normal Other: Left parotid mass as seen previously. ASPECTS [facility] Stroke Program Early CT Score) - Ganglionic level infarction (caudate, lentiform nuclei, internal capsule, insula, M1-M3 cortex): No acute insult identifiable. Extensive old infarct on the left. -  Supraganglionic infarction (M4-M6 cortex): A old infarct on the left. Total score (0-10 with 10 being normal): No acute lesion suspected. Difficult to apply given the extensive old left MCA infarction. IMPRESSION: 1. No acute finding. No apparent change since March of last year. Old infarction in the left MCA territory. 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 2. ASPECTS is difficult to apply given the extensive old left MCA stroke. 3. These results were communicated to Dr. Pearlean Martinez at 12:20 pmon 4/15/2020by text page via the [facility] messaging system. Electronically Signed   By: Paulina Fusi M.D.   On: 07/10/2018 12:20   Procedure: 2D Echo, Color Doppler and Cardiac Doppler Date of Exam: 07/10/2018 IMPRESSIONS  1. The left ventricle has hyperdynamic systolic function, with an ejection fraction of >65%. The cavity size was normal. There is mild asymmetric left ventricular hypertrophy of the basal anteroseptal wall. Left ventricular diastolic Doppler parameters  are consistent with impaired relaxation. No evidence of left ventricular regional wall motion abnormalities.  2. The right ventricle has normal systolic function. The cavity was normal. There is no increase in right ventricular wall thickness.  3. There is moderate mitral annular calcification present.  4. The aortic valve is tricuspid. Mild thickening of the aortic valve. Mild calcification of the aortic valve.  5. The aortic root is normal in size and structure.  6. No intracardiac thrombi or masses were visualized.  FINDINGS  Left Ventricle: The left ventricle has hyperdynamic systolic function, with an ejection fraction of >65%. The cavity size was normal. There is mild asymmetric left ventricular hypertrophy of the basal anteroseptal wall. Left ventricular diastolic  Doppler parameters are consistent with impaired relaxation. No evidence of left ventricular regional wall motion abnormalities.. Right Ventricle: The right ventricle has normal systolic  function. The cavity was normal. There is no increase in right ventricular wall thickness. Left Atrium: Left atrial size was normal in size. Right Atrium: Right atrial size was normal in size. Right atrial pressure is estimated at 10 mmHg. Interatrial Septum: No atrial level shunt detected by color flow Doppler. Pericardium: There is no evidence of pericardial effusion. Mitral Valve: The mitral valve is normal in structure. There is moderate mitral annular calcification present. Mitral valve regurgitation is trivial by color  flow Doppler. Tricuspid Valve: The tricuspid valve is normal in structure. Tricuspid valve regurgitation is trivial by color flow Doppler. Aortic Valve: The aortic valve is tricuspid Mild thickening of the aortic valve. Mild calcification of the aortic valve. Aortic valve regurgitation was not visualized by color flow Doppler. There is no evidence of aortic valve stenosis. Pulmonic Valve: The pulmonic valve was not assessed. Pulmonic valve regurgitation is not visualized by color flow Doppler. Aorta: The aortic root is normal in size and structure.  Leland Her, DO 07/10/2018, 3:55 PM PGY-3, [place] [facility] FPTS Team 2 pager: [phone], text pages welcome

## 2018-07-10 NOTE — Progress Notes (Signed)
  Echocardiogram 2D Echocardiogram has been performed.  [name] 07/10/2018, 2:09 PM

## 2018-07-10 NOTE — Progress Notes (Signed)
EEG complete - results pending 

## 2018-07-11 ENCOUNTER — Inpatient Hospital Stay (HOSPITAL_COMMUNITY): Payer: Medicare Other

## 2018-07-11 DIAGNOSIS — R4182 Altered mental status, unspecified: Secondary | ICD-10-CM

## 2018-07-11 LAB — GLUCOSE, CAPILLARY
Glucose-Capillary: 137 mg/dL — ABNORMAL HIGH (ref 70–99)
Glucose-Capillary: 202 mg/dL — ABNORMAL HIGH (ref 70–99)
Glucose-Capillary: 179 mg/dL — ABNORMAL HIGH (ref 70–99)
Glucose-Capillary: 134 mg/dL — ABNORMAL HIGH (ref 70–99)

## 2018-07-11 LAB — CBC
HCT: 38.8 % — ABNORMAL LOW (ref 39.0–52.0)
Hemoglobin: 12.3 g/dL — ABNORMAL LOW (ref 13.0–17.0)
MCH: 28 pg (ref 26.0–34.0)
MCHC: 31.7 g/dL (ref 30.0–36.0)
MCV: 88.4 fL (ref 80.0–100.0)
Platelets: 162 10*3/uL (ref 150–400)
RBC: 4.39 MIL/uL (ref 4.22–5.81)
RDW: 14 % (ref 11.5–15.5)
WBC: 6.4 10*3/uL (ref 4.0–10.5)
nRBC: 0 % (ref 0.0–0.2)

## 2018-07-11 LAB — URINE CULTURE: Culture: NO GROWTH

## 2018-07-11 LAB — TSH: TSH: 2.574 u[IU]/mL (ref 0.350–4.500)

## 2018-07-11 MED ORDER — RESOURCE THICKENUP CLEAR PO POWD
Freq: Once | ORAL | Status: DC
  Filled 2018-07-11: qty 125

## 2018-07-11 NOTE — Progress Notes (Signed)
STROKE TEAM PROGRESS NOTE   INTERVAL HISTORY I personally reviewed history of presenting illness and imaging films.  Patient appears to be neurologically improving.  Is able to speak much better.  MRI scan shows left pontine lacunar infarct.  Echocardiogram is unremarkable.  LDL cholesterol is elevated at 150 mg percent and hemoglobin A1c is 7.9.  Vitals:   07/11/18 0340 07/11/18 0744 07/11/18 0955 07/11/18 1201  BP: (!) 145/50 (!) 151/56  (!) 169/79  Pulse: 67 68  73  Resp: Temp: 98.3 F (36.8 C) 98.6 F (37 C)  99.2 F (37.3 C)  TempSrc: Oral Oral  Oral  SpO2: 90% 95% 95% 94%    CBC:  Recent Labs  Lab 07/10/18 1344 07/11/18 0846  WBC 6.4 6.4  NEUTROABS 4.2  --   HGB 14.0 12.3*  HCT 44.9 38.8*  MCV 89.6 88.4  PLT 170 162    Basic Metabolic Panel:  Recent Labs  Lab 07/10/18 1344 07/10/18 1356  NA 142  --   K 4.1  --   CL 106  --   CO2 24  --   GLUCOSE 123*  --   BUN 15  --   CREATININE 1.06 1.00  CALCIUM 9.6  --    Lipid Panel:     Component Value Date/Time   CHOL 226 (H) 07/10/2018 1344   TRIG 165 (H) 07/10/2018 1344   HDL 43 07/10/2018 1344   CHOLHDL 5.3 07/10/2018 1344   VLDL 33 07/10/2018 1344   LDLCALC 150 (H) 07/10/2018 1344   HgbA1c:  Lab Results  Component Value Date   HGBA1C 7.9 (H) 07/10/2018   Urine Drug Screen: No results found for: LABOPIA, COCAINSCRNUR, LABBENZ, AMPHETMU, THCU, LABBARB  Alcohol Level No results found for: ETH  IMAGING Ct Angio Head W Or Wo Contrast  Result Date: 07/10/2018 CLINICAL DATA:  Slurred speech EXAM: CT ANGIOGRAPHY HEAD AND NECK TECHNIQUE: Multidetector CT imaging of the head and neck was performed using the standard protocol during bolus administration of intravenous contrast. Multiplanar CT image reconstructions and MIPs were obtained to evaluate the vascular anatomy. Carotid stenosis measurements (when applicable) are obtained utilizing NASCET criteria, using the distal internal carotid diameter as  the denominator. CONTRAST:  75mL OMNIPAQUE IOHEXOL 350 MG/ML SOLN COMPARISON:  Head CT same day FINDINGS: CTA NECK FINDINGS Aortic arch: Atherosclerotic irregularity of the aortic arch. Branching pattern is normal with left vertebral artery arising from the arch. No origin stenosis. Right carotid system: Common carotid artery widely patent to the bifurcation region. Calcified plaque at the carotid bifurcation and ICA bulb. No stenosis. Cervical ICA widely patent beyond that. Left carotid system: Common carotid artery widely patent to the bifurcation. Soft and calcified plaque at the carotid bifurcation and ICA bulb. Minimal diameter at the distal bulb is 4.5 mm. Compared to a more distal cervical ICA diameter of 5 mm, this indicates a 10% stenosis. Vertebral arteries: Both vertebral arteries are widely patent. As noted above, the left vertebral artery arises from the arch. Skeleton: Ordinary cervical spondylosis. Other neck: No mass or lymphadenopathy. Upper chest: Emphysema and pulmonary scarring.  No active process. Review of the MIP images confirms the above findings CTA HEAD FINDINGS Anterior circulation: Right internal carotid artery is patent through the siphon region. There is atherosclerotic calcification but no narrowing greater than 30%. Right ICA supplies primarily the right middle cerebral artery territory. There is a diminutive A1 segment. No large or medium vessel occlusion identified. On the  left, the ICA is patent through the siphon region with calcification but no stenosis greater than 30%. Dominant A1 segment is widely patent. Left M1 segment is patent and normal. Superior division of the left middle cerebral artery is presumably chronically occluded, serving the region of old infarction. No sign of acute embolic occlusion. Posterior circulation: Both vertebral arteries are patent through the foramen magnum to the basilar. No basilar stenosis. Posterior circulation branch vessels are patent. Venous  sinuses: Patent and normal. Anatomic variants: None significant otherwise. Delayed phase: No abnormal enhancement. Review of the MIP images confirms the above findings IMPRESSION: Atherosclerotic change of the aorta with marked irregularity. Atherosclerotic change at both carotid bifurcations. No stenosis on the right. 10% stenosis of the distal bulb on the left. No acute intracranial large or medium vessel occlusion. 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 Electronically Signed   By: Paulina FusiMark  Shogry M.D.   On: 07/10/2018 12:32   Dg Chest 2 View  Result Date: 07/10/2018 CLINICAL DATA:  Altered mental status.  Stroke. EXAM: CHEST - 2 VIEW COMPARISON:  06/05/2018 FINDINGS: Artifact overlies the chest. Heart size is at the upper limits of normal. There is aortic atherosclerosis. The lungs are clear except for mild scarring. No infiltrate, collapse or effusion. IMPRESSION: No active cardiopulmonary disease. Electronically Signed   By: Paulina FusiMark  Shogry M.D.   On: 07/10/2018 13:27   Ct Angio Neck W Or Wo Contrast  Result Date: 07/10/2018 CLINICAL DATA:  Slurred speech EXAM: CT ANGIOGRAPHY HEAD AND NECK TECHNIQUE: Multidetector CT imaging of the head and neck was performed using the standard protocol during bolus administration of intravenous contrast. Multiplanar CT image reconstructions and MIPs were obtained to evaluate the vascular anatomy. Carotid stenosis measurements (when applicable) are obtained utilizing NASCET criteria, using the distal internal carotid diameter as the denominator. CONTRAST:  75mL OMNIPAQUE IOHEXOL 350 MG/ML SOLN COMPARISON:  Head CT same day FINDINGS: CTA NECK FINDINGS Aortic arch: Atherosclerotic irregularity of the aortic arch. Branching pattern is normal with left vertebral artery arising from the arch. No origin stenosis. Right carotid system: Common carotid artery widely patent to the  bifurcation region. Calcified plaque at the carotid bifurcation and ICA bulb. No stenosis. Cervical ICA widely patent beyond that. Left carotid system: Common carotid artery widely patent to the bifurcation. Soft and calcified plaque at the carotid bifurcation and ICA bulb. Minimal diameter at the distal bulb is 4.5 mm. Compared to a more distal cervical ICA diameter of 5 mm, this indicates a 10% stenosis. Vertebral arteries: Both vertebral arteries are widely patent. As noted above, the left vertebral artery arises from the arch. Skeleton: Ordinary cervical spondylosis. Other neck: No mass or lymphadenopathy. Upper chest: Emphysema and pulmonary scarring.  No active process. Review of the MIP images confirms the above findings CTA HEAD FINDINGS Anterior circulation: Right internal carotid artery is patent through the siphon region. There is atherosclerotic calcification but no narrowing greater than 30%. Right ICA supplies primarily the right middle cerebral artery territory. There is a diminutive A1 segment. No large or medium vessel occlusion identified. On the left, the ICA is patent through the siphon region with calcification but no stenosis greater than 30%. Dominant A1 segment is widely patent. Left M1 segment is patent and normal. Superior division of the left middle cerebral artery is presumably chronically occluded, serving the region of old infarction. No sign of acute embolic occlusion. Posterior circulation: Both  vertebral arteries are patent through the foramen magnum to the basilar. No basilar stenosis. Posterior circulation branch vessels are patent. Venous sinuses: Patent and normal. Anatomic variants: None significant otherwise. Delayed phase: No abnormal enhancement. Review of the MIP images confirms the above findings IMPRESSION: Atherosclerotic change of the aorta with marked irregularity. Atherosclerotic change at both carotid bifurcations. No stenosis on the right. 10% stenosis of the distal  bulb on the left. No acute intracranial large or medium vessel occlusion. 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 Electronically Signed   By: Paulina Fusi M.D.   On: 07/10/2018 12:32   Mr Brain Wo Contrast  Result Date: 07/10/2018 CLINICAL DATA:  Stroke follow-up EXAM: MRI HEAD WITHOUT CONTRAST TECHNIQUE: Multiplanar, multiecho pulse sequences of the brain and surrounding structures were obtained without intravenous contrast. COMPARISON:  CTA head neck 07/10/2018 FINDINGS: BRAIN: Small focus of abnormal diffusion restriction within the left pons. No other diffusion abnormality. The midline structures are normal. Old posterior left MCA territory infarct. Multifocal white matter hyperintensity, most commonly due to chronic ischemic microangiopathy. Generalized atrophy without lobar predilection. Susceptibility-sensitive sequences show no chronic microhemorrhage or superficial siderosis. No mass lesion. VASCULAR: The major intracranial arterial and venous sinus flow voids are normal. SKULL AND UPPER CERVICAL SPINE: Calvarial bone marrow signal is normal. There is no skull base mass. Visualized upper cervical spine and soft tissues are normal. SINUSES/ORBITS: No fluid levels or advanced mucosal thickening. No mastoid or middle ear effusion. The orbits are normal. IMPRESSION: 1. Small acute or early subacute infarct the left pons, in keeping with reported right-sided weakness. No acute hemorrhage. 2. Old posterior left MCA territory infarct encephalomalacia. 3. Generalized atrophy. Electronically Signed   By: Deatra Robinson M.D.   On: 07/10/2018 16:04   Ct Head Code Stroke Wo Contrast  Result Date: 07/10/2018 CLINICAL DATA:  Code stroke.  Slurred speech EXAM: CT HEAD WITHOUT CONTRAST TECHNIQUE: Contiguous axial images were obtained from the base of the skull through the vertex without intravenous contrast. COMPARISON:   06/13/2017 FINDINGS: Brain: No brainstem abnormality is seen. Old left cerebellar infarction. Old infarction in the left MCA territory with atrophy, encephalomalacia and gliosis of the deep insula, posterior temporal lobe and parietal lobe. No sign of acute infarction, mass lesion, hemorrhage, hydrocephalus or extra-axial collection Vascular: There is atherosclerotic calcification of the major vessels at the base of the brain. Skull: Negative Sinuses/Orbits: Clear/normal Other: Left parotid mass as seen previously. ASPECTS [facility] Stroke Program Early CT Score) - Ganglionic level infarction (caudate, lentiform nuclei, internal capsule, insula, M1-M3 cortex): No acute insult identifiable. Extensive old infarct on the left. - Supraganglionic infarction (M4-M6 cortex): A old infarct on the left. Total score (0-10 with 10 being normal): No acute lesion suspected. Difficult to apply given the extensive old left MCA infarction. IMPRESSION: 1. No acute finding. No apparent change since March of last year. Old infarction in the left MCA territory. 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 2. ASPECTS is difficult to apply given the extensive old left MCA stroke. 3. These results were communicated to Dr. Pearlean Brownie at 12:20 pmon 4/15/2020by text page via the [facility] messaging system. Electronically Signed   By: Paulina Fusi M.D.   On: 07/10/2018 12:20   2D Echocardiogram   1. The left ventricle has hyperdynamic systolic function, with an ejection fraction of >65%. The cavity size was normal. There is mild asymmetric left ventricular hypertrophy of the basal anteroseptal  wall. Left ventricular diastolic Doppler parameters  are consistent with impaired relaxation. No evidence of left ventricular regional wall motion abnormalities.  2. The right ventricle has normal systolic function. The cavity was normal. There is no increase in right ventricular wall thickness.  3. There is moderate mitral annular calcification present.  4. The aortic  valve is tricuspid. Mild thickening of the aortic valve. Mild calcification of the aortic valve.  5. The aortic root is normal in size and structure.  6. No intracardiac thrombi or masses were visualized.   PHYSICAL EXAM Pleasant elderly Caucasian male not in distress. . Afebrile. Head is nontraumatic. Neck is supple without bruit.    Cardiac exam no murmur or gallop. Lungs are clear to auscultation. Distal pulses are well felt. Neurological Exam :  Awake alert oriented x2.  Mild expressive greater than receptive aphasia with nonfluent speech and paraphasic errors and word finding difficulties.  Follow pull midline in 1 and occasional two-step commands.  Has difficulty with naming and repetition.  Extraocular movements appear full range without nystagmus.  Blinks to threat bilaterally.  Right lower facial weakness.  Tongue midline.  Motor system exam reveals no upper or lower extremity drift but diminished fine finger movements on the right.  Orbits left over right upper extremity.  Mild weakness of right grip and hip flexion only.  Deep tendon reflexes are symmetric.  Sensation appears slightly diminished in the right compared to the left.  Plantars are downgoing.  Gait not tested.  ASSESSMENT/PLAN [name] is a 85 y.o. male with history of HOH, HTN, DB, HLD, AF on Eliquis presenting from home who woke with difficulty speaking and R sided weakness.  Stroke:  left pontine infarct in setting of uncontrolled risk factors and AF on eliquis  Code Stroke CT head No acute stroke. Old L MCA infarct. Old L cerebellar infarct.   CTA head & neck aortic atherosclerosis. B ICA atherosclerosis. No large or medium vessel occlusion. Chronic L MCA M2 superior division chronic occlusion associated w/ old L MCA infarct.   CXR NAD  MRI  Small L pontine infarct. Old L MCA infarct. Generalized atrophy.  2D Echo EF >65%. No source of embolus   EEG normal   LDL 150  HgbA1c 7.9  Eliquis for VTE  prophylaxis  Eliquis (apixaban) daily prior to admission (possible aspirin 325 but med rec unknown), now on Eliquis (apixaban) daily. Continue eliquis at d/c   Therapy recommendations:  pending   Disposition:  pending   Atrial Fibrillation  Home anticoagulation:  Eliquis (apixaban) daily continued in the hospital . Continue Eliquis (apixaban) daily at discharge  Hypertension  Stable . BP goal normotensive  Hyperlipidemia  Home meds:  lipitor 40  Now on lipitor 80  LDL 150, goal < 70  Continue statin at discharge  Diabetes type II Uncontrolled  HgbA1c 7.9, goal < 7.0  Other Stroke Risk Factors  Advanced age  Former Cigarette smoker  Hx stroke/TIA  L MCA infarct  Other Active Problems  COPD  Hypothyroidism    NOTHING FURTHER TO ADD FROM THE STROKE STANDPOINT  Patient has a 10-15% risk of having another stroke over the next year, the highest risk is within 2 weeks of the most recent stroke/TIA Ongoing risk factor control by Primary Care Physician  Stroke Service will sign off. Please call should any needs arise.  Follow-up Stroke Clinic at [facility] in 4 weeks, order placed.   Hospital day # 1  I  have personally obtained history,examined this patient, reviewed notes, independently viewed imaging studies, participated in medical decision making and plan of care.ROS completed by me personally and pertinent positives fully documented  I have made any additions or clarifications directly to the above note.  He presented with speech difficulties and right-sided weakness secondary to left pontine infarct likely from small vessel disease but does have atrial fibrillation for which she is on long-term Eliquis.  Continue Eliquis and aggressive risk factor modification.  Physical occupational therapy rehab consults.  Follow-up as an outpatient in the stroke clinic in 6 weeks.  Greater than 50% time during this 25-minute visit was spent on counseling  and coordination of care about his lacunar stroke and discussion about atrial fibrillation and stroke prevention and answering questions.  Delia Heady, MD Medical Director [facility] St[facility] Pager: [phone] 07/11/2018 1:34 PM   To contact Stroke Continuity provider, please refer to [email or web address]. After hours, contact General Neurology

## 2018-07-11 NOTE — Progress Notes (Signed)
Pharmacy is unable to confirm the medications the patient was taking at home. All options have been exhausted and a resolution to the situation is not expected.   Where possible, their outpatient pharmacy(s) have been contacted for the last time prescriptions were filled and that information has been added to each medication in an Order Note (highlighted yellow below the medication).  Please contact pharmacy if further assistance is needed.   Charolotte Eke, PharmD. Mobile: [phone]. 07/11/2018,8:18 AM.

## 2018-07-11 NOTE — Evaluation (Signed)
Occupational Therapy Evaluation Patient Details Name: [name] MRN: [id number] DOB: 1928-03-08 Today's Date: 07/11/2018    History of Present Illness Pt is an 85 y.o. male who presented with R sided weakness and speech difficulties. PMH is significant for Afib on eliquis, COPD, DM, HTN, CAD, h/o CVA. 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    Clinical Impression   This 85 y/o male presents with the above. PTA pt was independent with ADL and functional mobility. Pt presenting with speech difficulties, weakness (more notable in R UE vs LUE), decreased sitting and standing balance impacting his functional performance. Pt appears HOH so often requires repetition and multimodal cues for following commands and to answer questions this session (questionable cognitive impairments?). He currently requires minA+2 for stand pivot transfers (HHA); modA for LB ADL and minA for UB ADL. Pt will benefit from continued acute OT services and feel he is appropriate for CIR level therapy services at time of discharge to maximize his safety and independence with ADL and mobility. Will follow.     Follow Up Recommendations  CIR;Supervision/Assistance - 24 hour    Equipment Recommendations  Other (comment)(TBA)           Precautions / Restrictions Precautions Precautions: Fall Restrictions Weight Bearing Restrictions: No      Mobility Bed Mobility Overal bed mobility: Needs Assistance Bed Mobility: Supine to Sit     Supine to sit: Min assist     General bed mobility comments: Assist for initiation and trunk elevation to full sitting position.   Transfers Overall transfer level: Needs assistance Equipment used: 2 person hand held assist Transfers: Sit to/from [employer] Sit to Stand: Min assist;+2 physical assistance         General transfer comment: Assist to  power-up to full standing position. Increased time to gain/maintain standing balance prior to pivot around to chair. Pt was able to take pivotal steps around to the chair - many steps to turn in a circle to sit.     Balance Overall balance assessment: Needs assistance Sitting-balance support: Feet supported;No upper extremity supported Sitting balance-Leahy Scale: Fair     Standing balance support: Bilateral upper extremity supported;During functional activity Standing balance-Leahy Scale: Poor Standing balance comment: Requires UE support to maintain standing balance.                            ADL either performed or assessed with clinical judgement   ADL Overall ADL's : Needs assistance/impaired Eating/Feeding: Set up;Sitting   Grooming: Set up;Min guard;Sitting   Upper Body Bathing: Minimal assistance;Sitting   Lower Body Bathing: Moderate assistance;Sit to/from stand   Upper Body Dressing : Minimal assistance;Sitting   Lower Body Dressing: Moderate assistance;Sit to/from stand Lower Body Dressing Details (indicate cue type and reason): minA +2 standing balance Toilet Transfer: Minimal assistance;+2 for physical assistance;+2 for safety/equipment;Stand-pivot Toilet Transfer Details (indicate cue type and reason): simulated via transfer to recliner Toileting- Clothing Manipulation and Hygiene: Moderate assistance;Sit to/from stand Toileting - Clothing Manipulation Details (indicate cue type and reason): pt noted to have stool on bed pad once standing from EOB; able to perform peri-care in standing with steadying assist and assist for gown management     Functional mobility during ADLs: Minimal assistance;+2 for physical assistance;+2 for safety/equipment(HHA)       Vision Baseline Vision/History: Wears glasses Patient  Visual Report: Other (comment)(reports is supposed to have eye surgery) Vision Assessment?: Yes Additional Comments: pt able to read clock to  therapists; does not read so could not read sign to therapist     Perception     Praxis      Pertinent Vitals/Pain Pain Assessment: No/denies pain     Hand Dominance     Extremity/Trunk Assessment Upper Extremity Assessment Upper Extremity Assessment: Generalized weakness;RUE deficits/detail RUE Deficits / Details: discoordination noted in RUE (compared to LUE) RUE Coordination: decreased fine motor   Lower Extremity Assessment Lower Extremity Assessment: Defer to PT evaluation   Cervical / Trunk Assessment Cervical / Trunk Assessment: Other exceptions Cervical / Trunk Exceptions: Forward head posture with rounded shoulders   Communication Communication Communication: HOH;Expressive difficulties   Cognition Arousal/Alertness: Awake/alert Behavior During Therapy: WFL for tasks assessed/performed Overall Cognitive Status: Difficult to assess                                 General Comments: pt with aphasia and also appears HOH requiring repetition and multimodal cues to follow commands   General Comments  VSS    Exercises     Shoulder Instructions      Home Living Family/patient expects to be discharged to:: Private residence Living Arrangements: Children Available Help at Discharge: Available 24 hours/day Type of Home: House Home Access: Level entry     Home Layout: One level     Bathroom Shower/Tub: Tub/shower unit                Lives With: Son    Prior Functioning/Environment Level of Independence: Independent                 OT Problem List: Decreased strength;Decreased range of motion;Decreased activity tolerance;Impaired balance (sitting and/or standing);Decreased coordination;Decreased cognition;Decreased knowledge of use of DME or AE;Decreased safety awareness      OT Treatment/Interventions: Self-care/ADL training;Therapeutic exercise;Neuromuscular education;DME and/or AE instruction;Therapeutic  activities;Patient/family education;Balance training;Cognitive remediation/compensation    OT Goals(Current goals can be found in the care plan section) Acute Rehab OT Goals Patient Stated Goal: Pt did not state goals during session OT Goal Formulation: With patient Time For Goal Achievement: 07/25/18 Potential to Achieve Goals: Good  OT Frequency: Min 2X/week   Barriers to D/C:            Co-evaluation PT/OT/SLP Co-Evaluation/Treatment: Yes Reason for Co-Treatment: For patient/therapist safety;To address functional/ADL transfers   OT goals addressed during session: ADL's and self-care      AM-PAC OT "6 Clicks" Daily Activity     Outcome Measure Help from another person eating meals?: A Little Help from another person taking care of personal grooming?: A Little Help from another person toileting, which includes using toliet, bedpan, or urinal?: A Lot Help from another person bathing (including washing, rinsing, drying)?: A Little Help from another person to put on and taking off regular upper body clothing?: A Little Help from another person to put on and taking off regular lower body clothing?: A Lot 6 Click Score: 16   End of Session Equipment Utilized During Treatment: Gait belt Nurse Communication: Mobility status  Activity Tolerance: Patient tolerated treatment well Patient left: in chair;with call bell/phone within reach;with chair alarm set  OT Visit Diagnosis: Unsteadiness on feet (R26.81);Muscle weakness (generalized) (M62.81)                Time: 1610-9604 OT Time Calculation (min):  25 min Charges:  OT General Charges $OT Visit: 1 Visit OT Evaluation $OT Eval Moderate Complexity: 1 Mod  Marcy SirenBreanna Ramonita Koenig, OT [employer] [facility] Pager [phone] Office [phone]   Orlando PennerBreanna L Jeanetta Alonzo 07/11/2018, 1:56 PM

## 2018-07-11 NOTE — Progress Notes (Signed)
Patient son Mayjor Ager) came to pick up pt meds and clothing, including wallet with money. Per patient request. RN met son at front entrance.

## 2018-07-11 NOTE — TOC Initial Note (Signed)
Transition of Care [facility]) - Initial/Assessment Note    Patient Details  Name: [name] MRN: [id number] Date of Birth: 07-Aug-1927  [facility] CM/SW Contact:    Kermit Balo, RN Phone Number: 07/11/2018, 2:03 PM  Clinical Narrative:                 Pt is confused. CM spoke to pts son over the phone. He states, his wife is able to provide supervision at home.   Expected Discharge Plan: IP Rehab Facility Barriers to Discharge: Continued Medical Work up   Patient Goals and CMS Choice        Expected Discharge Plan and Services Expected Discharge Plan: IP Rehab Facility   Discharge Planning Services: CM Consult   Living arrangements for the past 2 months: Mobile Home(2 steps to enter)                          Prior Living Arrangements/Services Living arrangements for the past 2 months: Mobile Home(2 steps to enter) Lives with:: Adult Children(son and daughter in Social worker) Patient language and need for interpreter reviewed:: Yes(no needs) Do you feel safe going back to the place where you live?: Yes      Need for Family Participation in Patient Care: Yes (Comment) Care giver support system in place?: Yes (comment)(daughter in law is able to provide 24 hours supervision and assistance) Current home services: DME(cane and walker) Criminal Activity/Legal Involvement Pertinent to Current Situation/Hospitalization: No - Comment as needed  Activities of Daily Living      Permission Sought/Granted Permission sought to share information with : Family Supports Permission granted to share information with : Yes, Verbal Permission Granted  Share Information with NAME: Henrick Melber     Permission granted to share info w Relationship: son  Permission granted to share info w Contact Information: [phone]  Emotional Assessment Appearance:: Appears stated age Attitude/Demeanor/Rapport: (confused) Affect (typically observed): Pleasant Orientation: : Oriented  to Self   Psych Involvement: No (comment)  Admission diagnosis:  Altered mental status, unspecified altered mental status type [R41.82] Patient Active Problem List   Diagnosis Date Noted  . Stroke (HCC) 07/10/2018  . Requires supplemental oxygen 06/05/2018  . Chest pain 03/29/2018  . Atrial fibrillation (HCC) 05/20/2015  . RBBB 05/20/2015  . Prolonged Q-T interval on ECG 05/20/2015  . CAD (coronary artery disease) 05/20/2015  . History of CVA (cerebrovascular accident) 05/20/2015  . Atrial fibrillation with RVR (HCC)   . Bronchiectasis with acute exacerbation (HCC)   . Unstable angina (HCC) 03/27/2013  . COPD exacerbation (HCC) 03/25/2013  . Diabetes mellitus type II, uncontrolled (HCC) 03/25/2013  . Hypothyroidism 03/25/2013  . BPH (benign prostatic hyperplasia) 03/25/2013  . HTN (hypertension) 03/25/2013   PCP:  Bing Neighbors, FNP Pharmacy:   [facility] [phone] [place], [place] - [phone] AT [facility] [address] [facility] [address] [place] [address] Phone: [phone] Fax: [phone]  [facility] - [place], [place] - [address] [place] [address] Phone: [phone] Fax: [phone]     Social Determinants of Health (SDOH) Interventions  daughter in law does his medications. Family provides transportation.  Readmission Risk Interventions No flowsheet data found.

## 2018-07-11 NOTE — Progress Notes (Addendum)
Family Medicine Teaching Service Daily Progress Note Intern Pager: [phone]  Patient name: [name] Medical record number: [id number] Date of birth: 11-Aug-1927 Age: 85 y.o. Gender: male  Primary Care Provider: Bing Neighbors, FNP Consultants: Neurology Code Status: DNR/DNI Preferred Emergency Contact: Ron, son  Pt Overview and Major Events to Date:  [name] is a 85 y.o. male presenting with R sided weakness and speech difficulties. PMH is significant for Afib on eliquis, COPD, DM, HTN, CAD, h/o CVA.  Assessment and Plan:   R sided weakness and expressive aphasia 2/2 L pons infarct. Acute on chronic. Neurology on board and we are finishing the stoke work up. So far, EEG normal and 2D echo showd on intracardiac thrombi or masses. He was not a candidate for thrombolysis. MRI brain showed a small acute or early subacute infarct in the L pons in keeping with R sided weakness. No hemorrhage. No new vessel occlusion seen on CTA head/neck. He has continued worsening of his expressive aphasia compared to baseline. - admit to inpatient, attending Dr. Leveda Anna - Neurology recs: obtain risk stratification labs and f/u imaging: EEG showed no signs of seizure activity, Echo no thrombus, will adjust statin and diabetes meds as needed. - permissive HTN - neuro checks - cont atorvastatin 80mg  daily - PT/OT/SLP consulted   AFib. Rate controlled, HR 65-107 since admission. EKG on admit showing afib without RVR and old RBBB - continue eliquis  Diabetes. a1c 7.9. CBGs in past 24hrs of 131, 221, and 137. - If CBGs continue to be over 200 consider adding sSSI - continue home metformin - monitor CBGs with meals  Hyperlipidemia. LDL 150. He is too old for ASCVD risk score to have value.  - cont atorvastatin 80mg  qd  HTN. SBP 122-173 and DBP 50-98 since admission. - holding home meds for permissive HTN  COPD. At home on symbicort - dulera while inpatient - prn  albuterol  Hypothyroidism. Had recent dose adjustment of levothyroxine 1 month ago by PCP. - TSH normal. - Cont home levothyroxine  FEN/GI: Heart healthy diet PPx: Eliquis  Disposition: Pending PT/OT recs  Subjective:  Patient is interactive and responds appropriately. He states he lives at home with his son and would like to return home. He has no complaints this morning.  Objective: Temp:  [97.9 F (36.6 C)-98.6 F (37 C)] 98.6 F (37 C) (04/16 0744) Pulse Rate:  [58-107] 68 (04/16 0744) Resp:  [16-25] 20 (04/16 0744) BP: (122-173)/(50-98) 151/56 (04/16 0744) SpO2:  [90 %-96 %] 95 % (04/16 0744) Physical Exam: Gen: Alert and Oriented x 3, NAD HEENT: Normocephalic, atraumatic CV: faint heart sounds, irregularly irregular rthythm, normal rate, normal S1, S2 split Resp: CTAB, no wheezing, rales, or rhonchi, comfortable work of breathing Abd: non-distended, non-tender, soft, +bs in all four quadrants Ext: no clubbing, cyanosis, or edema Neuro: RUE 4/5 strength LUE 5/5, LE 5/5 bilaterally Skin: warm, dry, intact, no rashes  Laboratory: Recent Labs  Lab 07/10/18 1344  WBC 6.4  HGB 14.0  HCT 44.9  PLT 170   Recent Labs  Lab 07/10/18 1344 07/10/18 1356  NA 142  --   K 4.1  --   CL 106  --   CO2 24  --   BUN 15  --   CREATININE 1.06 1.00  CALCIUM 9.6  --   PROT 6.4*  --   BILITOT 0.9  --   ALKPHOS 48  --   ALT 14  --   AST  24  --   GLUCOSE 123*  --    Lipid Panel: Total Cholesterol 226, HDL 43, LDL 150 A1c: 7.9 TSH: 2.574 Ammonia: 16  Imaging/Diagnostic Tests: CT Head/Neck: IMPRESSION: 1. No acute finding. No apparent change since March of last year. Old infarction in the left MCA territory. 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 2. ASPECTS is difficult to apply given the extensive old left MCA Stroke. MRI: IMPRESSION: 1. Small acute or early subacute infarct the left pons, in keeping with reported right-sided weakness. No acute hemorrhage. 2. Old  posterior left MCA territory infarct encephalomalacia. 3. Generalized atrophy. Echo: LVEF 65% with mild asymmetric LV hypertrophy consistent with impaired abnormalities. EEG: This normal EEG is recorded in the waking and sleep state. There was no seizure or seizure predisposition recorded on this study   Arlyce HarmanLockamy, Corwyn Vora, DO 07/11/2018, 8:11 AM PGY-2, [facility] FPTS Intern pager: [phone], text pages welcome

## 2018-07-11 NOTE — Progress Notes (Signed)
Inpatient Rehab Admissions:  Inpatient Rehab Consult received.  I met with pt at the bedside for rehabilitation assessment and to discuss goals and expectations of an inpatient rehab admission.  Pt indicated interest in CIR but wanted AC to discuss with his son, Jori Moll, first. Spoke with son, confirmed mutual interest in [place], as well as confirmed caregiver support. AC discussed insurance benefits and program expectations. Decision was made to pursue CIR at this time.   AC will follow along for bed availability and medical clearance.  Jhonnie Garner, OTR/L  Rehab Admissions Coordinator  [phone] 07/11/2018 4:33 PM

## 2018-07-11 NOTE — Evaluation (Signed)
Speech Language Pathology Evaluation Patient Details Name: [name] MRN: [id number] DOB: 1927-04-27 Today's Date: 07/11/2018 Time: 7972-8206 SLP Time Calculation (min) (ACUTE ONLY): 31 min  Problem List:  Patient Active Problem List   Diagnosis Date Noted  . Stroke (HCC) 07/10/2018  . Requires supplemental oxygen 06/05/2018  . Chest pain 03/29/2018  . Atrial fibrillation (HCC) 05/20/2015  . RBBB 05/20/2015  . Prolonged Q-T interval on ECG 05/20/2015  . CAD (coronary artery disease) 05/20/2015  . History of CVA (cerebrovascular accident) 05/20/2015  . Atrial fibrillation with RVR (HCC)   . Bronchiectasis with acute exacerbation (HCC)   . Unstable angina (HCC) 03/27/2013  . COPD exacerbation (HCC) 03/25/2013  . Diabetes mellitus type II, uncontrolled (HCC) 03/25/2013  . Hypothyroidism 03/25/2013  . BPH (benign prostatic hyperplasia) 03/25/2013  . HTN (hypertension) 03/25/2013   Past Medical History:  Past Medical History:  Diagnosis Date  . Atrial fibrillation (HCC)   . COPD (chronic obstructive pulmonary disease) (HCC)   . CVA (cerebral infarction)   . Diabetes mellitus without complication (HCC)   . Essential hypertension, benign   . Hard of hearing    Past Surgical History:  Past Surgical History:  Procedure Laterality Date  . HERNIA REPAIR    . LEFT HEART CATHETERIZATION WITH CORONARY ANGIOGRAM N/A 03/28/2013   Procedure: LEFT HEART CATHETERIZATION WITH CORONARY ANGIOGRAM;  Surgeon: Peter M Swaziland, MD;  Location: [facility] CATH LAB;  Service: Cardiovascular;  Laterality: N/A;   HPI:  Pt is an 85 y.o. male who presented with R sided weakness and speech difficulties. PMH is significant for Afib on eliquis, COPD, DM, HTN, CAD, h/o CVA. 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   Assessment / Plan / Recommendation Clinical Impression  Pt has a history of L MCA infarct and  his son reported that at baseline he was able to communicate but did have some difficulty with word retrieval as well as comprehension of complex information. During today's evaluation, he demonstrated difficulty with naming, sentence repetition, auditory comprehension of complex yes/no questions, and completion of 2-step commands. No motor speech deficits were demonstrated and the pt's cognition could not be reliably assessed due to his noted deficitis in auditory comprehension. Based on the report from the pt's son, the pt appears to be at his baseline level of functioning at this time with regards to language. Further skilled SLP services are not clinically indicated at this time for language. Pt, nursing, and his son were educated regarding this and all parties verbalized understanding as well as agreement with plan of care.    SLP Assessment  SLP Recommendation/Assessment: Patient does not need any further Speech Lanaguage Pathology Services SLP Visit Diagnosis: Aphasia (R47.01)    Follow Up Recommendations  None for speech/language/cognition but a swallowing evaluation has been requested by Dr. Pearlean Brownie due to site of lesion.    Frequency and Duration           SLP Evaluation Cognition  Overall Cognitive Status: Difficult to assess(due to aphasia) Orientation Level: Oriented to person;Disoriented to place;Disoriented to time;Disoriented to situation       Comprehension  Auditory Comprehension Overall Auditory Comprehension: Impaired at baseline Yes/No Questions: Impaired Basic Biographical Questions: (4/4) Complex Questions: (2/4) Commands: Impaired One Step Basic Commands: (4/4) Two Step Basic Commands: (0/4) Reading Comprehension Reading Status: Not tested    Expression Expression Primary Mode of Expression: Verbal Verbal Expression Overall  Verbal Expression: Impaired at baseline Initiation: No impairment Automatic Speech: Counting;Day of week(Counting & Days: WNL; Months:  0/12) Level of Generative/Spontaneous Verbalization: Sentence Repetition: Impaired Level of Impairment: Sentence level(1/4) Naming: Impairment Responsive: (2/4) Confrontation: (5/10) Convergent: (Sentence completion: 0/5) Verbal Errors: Perseveration;Neologisms   Oral / Motor  Oral Motor/Sensory Function Overall Oral Motor/Sensory Function: Within functional limits Motor Speech Overall Motor Speech: Appears within functional limits for tasks assessed Respiration: Within functional limits Phonation: Normal Resonance: Within functional limits Articulation: Within functional limitis Intelligibility: Intelligible Motor Planning: Witnin functional limits Motor Speech Errors: Not applicable   Orange Hilligoss I. Vear ClockPhillips, MS, CCC-SLP Acute Rehabilitation Services Office number [phone] Pager [phone]                    Scheryl MartenShanika I Rylah Fukuda 07/11/2018, 10:34 AM

## 2018-07-11 NOTE — Evaluation (Signed)
Clinical/Bedside Swallow Evaluation Patient Details  Name: [name] MRN: [id number] Date of Birth: 05/02/1927  Today's Date: 07/11/2018 Time: SLP Start Time (ACUTE ONLY): 1106 SLP Stop Time (ACUTE ONLY): 1117 SLP Time Calculation (min) (ACUTE ONLY): 11 min  Past Medical History:  Past Medical History:  Diagnosis Date  . Atrial fibrillation (HCC)   . COPD (chronic obstructive pulmonary disease) (HCC)   . CVA (cerebral infarction)   . Diabetes mellitus without complication (HCC)   . Essential hypertension, benign   . Hard of hearing    Past Surgical History:  Past Surgical History:  Procedure Laterality Date  . HERNIA REPAIR    . LEFT HEART CATHETERIZATION WITH CORONARY ANGIOGRAM N/A 03/28/2013   Procedure: LEFT HEART CATHETERIZATION WITH CORONARY ANGIOGRAM;  Surgeon: Peter M Swaziland, MD;  Location: [facility] CATH LAB;  Service: Cardiovascular;  Laterality: N/A;   HPI:  Pt is an 85 y.o. male who presented with R sided weakness and speech difficulties. PMH is significant for Afib on eliquis, COPD, DM, HTN, CAD, h/o CVA. 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   Assessment / Plan / Recommendation Clinical Impression  Given site of lesion with acute/subacute pontine stroke, swallow evaluation ordered.  Pt presented with no focal CN deficits.  He followed commands and was able to feed himself, open packages.  Mastication of regular solids with WFL despite missing teeth; he tolerated purees and nectar thick liquids without concerns, but thin liquids elicited an immediate cough response, concerning for potential aspiration.  For today, continue regular solids; change liquids to nectar; will plan for MBS to elucidate nature of dysphagia.  Give meds whole in puree.  SLP Visit Diagnosis: Dysphagia, unspecified (R13.10)    Aspiration Risk       Diet Recommendation   regular solids, nectar  liquids  Medication Administration: Whole meds with puree    Other  Recommendations Oral Care Recommendations: Oral care BID Other Recommendations: Order thickener from pharmacy   Follow up Recommendations Other (comment)(tba)      Frequency and Duration            Prognosis        Swallow Study   General Date of Onset: 07/10/18 HPI: Pt is an 85 y.o. male who presented with R sided weakness and speech difficulties. PMH is significant for Afib on eliquis, COPD, DM, HTN, CAD, h/o CVA. 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 Type of Study: Bedside Swallow Evaluation Previous Swallow Assessment: Nov 2019 during admission for bronchopneumonia Diet Prior to this Study: Regular;Thin liquids Temperature Spikes Noted: No Respiratory Status: Room air History of Recent Intubation: No Behavior/Cognition: Alert;Cooperative;Confused Oral Cavity Assessment: Within Functional Limits Oral Care Completed by SLP: No Oral Cavity - Dentition: Missing dentition Vision: Functional for self-feeding Self-Feeding Abilities: Able to feed self Patient Positioning: Upright in chair Baseline Vocal Quality: Normal Volitional Cough: Strong Volitional Swallow: Able to elicit    Oral/Motor/Sensory Function Overall Oral Motor/Sensory Function: Within functional limits   Ice Chips Ice chips: Within functional limits   Thin Liquid Thin Liquid: Impaired Pharyngeal  Phase Impairments: Cough - Immediate    Nectar Thick Nectar Thick Liquid: Within functional limits Presentation: Cup   Honey Thick Honey Thick Liquid: Not tested   Puree Puree: Within functional limits   Solid  Solid: Within functional limits      Blenda MountsCouture, Jamaurion Slemmer Laurice 07/11/2018,11:26 AM  Marchelle FolksAmanda L. Samson Fredericouture, MA CCC/SLP Acute Rehabilitation Services Office number [phone] Pager [phone]

## 2018-07-11 NOTE — Progress Notes (Signed)
Modified Barium Swallow Progress Note  Patient Details  Name: [name] MRN: [id number] Date of Birth: 23-Dec-1927  Today's Date: 07/11/2018  Modified Barium Swallow completed.  Full report located under Chart Review in the Imaging Section.  Brief recommendations include the following:  Clinical Impression  Pt presents with functional oropharyngeal swallow marked by prolonged, piece-meal bolusing of regular solids (ultimately effective); adequate propulsion of all materials through pharynx with no residue post-swallow.  There was adequate laryngeal vestibule closure with only occasional penetration of thin liquids (PAS #2).  Penetration did not reach the vocal folds; there was no aspiration.  13 mm barium pill passed readily without deficit.  Recommend resuming a regular diet with thin liquids; pills whole with liquid.  No SLP f/u is needed.    Swallow Evaluation Recommendations       SLP Diet Recommendations: Regular solids;Thin liquid   Liquid Administration via: Cup;Straw   Medication Administration: Whole meds with liquid   Supervision: Patient able to self feed           Oral Care Recommendations: Oral care BID   Other Recommendations: Order thickener from pharmacy   Laylanie Kruczek L. Samson Frederic, MA CCC/SLP Acute Rehabilitation Services Office number [phone] Pager [phone]  Blenda Mounts Laurice 07/11/2018,1:10 PM

## 2018-07-11 NOTE — Discharge Summary (Signed)
Family Medicine Teaching [facility] Discharge Summary  Patient name: [name] Medical record number: [id number] Date of birth: [birth year] Age: 85 y.o. Gender: male Date of Admission: 07/10/2018  Date of Discharge: 07/12/2018 Admitting Physician: Moses Manners, MD  Primary Care Provider: Bing Neighbors, FNP Consultants: Neurology  Indication for Hospitalization:  Left pontine infarct  Discharge Diagnoses/Problem List:  Left pontine infarct Atrial Fibrillation T2DM HTN COPD HLD Hypothyroidism  Disposition: home with [facility] PT/OT  Discharge Condition: stable, improved  Discharge Exam:  General: in NAD, sitting up in bed CV: RRR, no murmurs Lungs: CTAB, NWOB on RA Abd: soft, nontender, nondistended, + bowel sounds Extremities: WWP, no LE edema Neuro: alert and awake, oriented. Mild expressive difficulties with speech but no slurring. No gross motor deficits  Brief Hospital Course:  Samad L Baileyis a 85 y.o.malewith PMH significant forAfib on eliquis, COPD, DM, HTN, CAD, h/o CVA with baseline expressive aphasia. He presented with R sided weakness and acute worsening of his expressive aphasi along with some confusion. He was found to have L pons acute infarct. Neurology was consulted. He was continued on his eliquis, deemed no need for antiplatelet, statin was intensified. Due to his confusion he had an EEG that was not consistent with seizure activity. His deficits significantly improved with his speech returning to baseline and only difficulty of fine movement in his R hand. He was evaluated by PT/OT and initially considered for CIR but deemed stable for discharge home with [facility] PT and OT services.   Issues for Follow Up:  1. Please ensure follow up with neurology stroke clinic in 6 weeks.  Significant Procedures: none  Significant Labs and Imaging:  Recent Labs  Lab 07/10/18 1344 07/11/18 0846 07/12/18 0346  WBC 6.4 6.4 6.5  HGB 14.0 12.3* 12.7*  HCT  44.9 38.8* 39.5  PLT 170 162 153   Recent Labs  Lab 07/10/18 1344 07/10/18 1356 07/12/18 0346  NA 142  --  138  K 4.1  --  4.2  CL 106  --  107  CO2 24  --  22  GLUCOSE 123*  --  155*  BUN 15  --  16  CREATININE 1.06 1.00 1.12  CALCIUM 9.6  --  9.1  ALKPHOS 48  --   --   AST 24  --   --   ALT 14  --   --   ALBUMIN 3.8  --   --    Ct Angio Head W Or Wo Contrast  Result Date: 07/10/2018 CLINICAL DATA:  Slurred speech EXAM: CT ANGIOGRAPHY HEAD AND NECK TECHNIQUE: Multidetector CT imaging of the head and neck was performed using the standard protocol during bolus administration of intravenous contrast. Multiplanar CT image reconstructions and MIPs were obtained to evaluate the vascular anatomy. Carotid stenosis measurements (when applicable) are obtained utilizing NASCET criteria, using the distal internal carotid diameter as the denominator. CONTRAST:  75mL OMNIPAQUE IOHEXOL 350 MG/ML SOLN COMPARISON:  Head CT same day FINDINGS: CTA NECK FINDINGS Aortic arch: Atherosclerotic irregularity of the aortic arch. Branching pattern is normal with left vertebral artery arising from the arch. No origin stenosis. Right carotid system: Common carotid artery widely patent to the bifurcation region. Calcified plaque at the carotid bifurcation and ICA bulb. No stenosis. Cervical ICA widely patent beyond that. Left carotid system: Common carotid artery widely patent to the bifurcation. Soft and calcified plaque at the carotid bifurcation and ICA bulb. Minimal diameter at the distal bulb is 4.5 mm.  Compared to a more distal cervical ICA diameter of 5 mm, this indicates a 10% stenosis. Vertebral arteries: Both vertebral arteries are widely patent. As noted above, the left vertebral artery arises from the arch. Skeleton: Ordinary cervical spondylosis. Other neck: No mass or lymphadenopathy. Upper chest: Emphysema and pulmonary scarring.  No active process. Review of the MIP images confirms the above findings CTA  HEAD FINDINGS Anterior circulation: Right internal carotid artery is patent through the siphon region. There is atherosclerotic calcification but no narrowing greater than 30%. Right ICA supplies primarily the right middle cerebral artery territory. There is a diminutive A1 segment. No large or medium vessel occlusion identified. On the left, the ICA is patent through the siphon region with calcification but no stenosis greater than 30%. Dominant A1 segment is widely patent. Left M1 segment is patent and normal. Superior division of the left middle cerebral artery is presumably chronically occluded, serving the region of old infarction. No sign of acute embolic occlusion. Posterior circulation: Both vertebral arteries are patent through the foramen magnum to the basilar. No basilar stenosis. Posterior circulation branch vessels are patent. Venous sinuses: Patent and normal. Anatomic variants: None significant otherwise. Delayed phase: No abnormal enhancement. Review of the MIP images confirms the above findings IMPRESSION: Atherosclerotic change of the aorta with marked irregularity. Atherosclerotic change at both carotid bifurcations. No stenosis on the right. 10% stenosis of the distal bulb on the left. No acute intracranial large or medium vessel occlusion. 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 Electronically Signed   By: Paulina Fusi M.D.   On: 07/10/2018 12:32   Dg Chest 2 View  Result Date: 07/10/2018 CLINICAL DATA:  Altered mental status.  Stroke. EXAM: CHEST - 2 VIEW COMPARISON:  06/05/2018 FINDINGS: Artifact overlies the chest. Heart size is at the upper limits of normal. There is aortic atherosclerosis. The lungs are clear except for mild scarring. No infiltrate, collapse or effusion. IMPRESSION: No active cardiopulmonary disease. Electronically Signed   By: Paulina Fusi M.D.   On: 07/10/2018 13:27   Ct  Angio Neck W Or Wo Contrast  Result Date: 07/10/2018 CLINICAL DATA:  Slurred speech EXAM: CT ANGIOGRAPHY HEAD AND NECK TECHNIQUE: Multidetector CT imaging of the head and neck was performed using the standard protocol during bolus administration of intravenous contrast. Multiplanar CT image reconstructions and MIPs were obtained to evaluate the vascular anatomy. Carotid stenosis measurements (when applicable) are obtained utilizing NASCET criteria, using the distal internal carotid diameter as the denominator. CONTRAST:  16mL OMNIPAQUE IOHEXOL 350 MG/ML SOLN COMPARISON:  Head CT same day FINDINGS: CTA NECK FINDINGS Aortic arch: Atherosclerotic irregularity of the aortic arch. Branching pattern is normal with left vertebral artery arising from the arch. No origin stenosis. Right carotid system: Common carotid artery widely patent to the bifurcation region. Calcified plaque at the carotid bifurcation and ICA bulb. No stenosis. Cervical ICA widely patent beyond that. Left carotid system: Common carotid artery widely patent to the bifurcation. Soft and calcified plaque at the carotid bifurcation and ICA bulb. Minimal diameter at the distal bulb is 4.5 mm. Compared to a more distal cervical ICA diameter of 5 mm, this indicates a 10% stenosis. Vertebral arteries: Both vertebral arteries are widely patent. As noted above, the left vertebral artery arises from the arch. Skeleton: Ordinary cervical spondylosis. Other neck: No mass or lymphadenopathy. Upper chest: Emphysema and pulmonary scarring.  No active process. Review of  the MIP images confirms the above findings CTA HEAD FINDINGS Anterior circulation: Right internal carotid artery is patent through the siphon region. There is atherosclerotic calcification but no narrowing greater than 30%. Right ICA supplies primarily the right middle cerebral artery territory. There is a diminutive A1 segment. No large or medium vessel occlusion identified. On the left, the ICA is  patent through the siphon region with calcification but no stenosis greater than 30%. Dominant A1 segment is widely patent. Left M1 segment is patent and normal. Superior division of the left middle cerebral artery is presumably chronically occluded, serving the region of old infarction. No sign of acute embolic occlusion. Posterior circulation: Both vertebral arteries are patent through the foramen magnum to the basilar. No basilar stenosis. Posterior circulation branch vessels are patent. Venous sinuses: Patent and normal. Anatomic variants: None significant otherwise. Delayed phase: No abnormal enhancement. Review of the MIP images confirms the above findings IMPRESSION: Atherosclerotic change of the aorta with marked irregularity. Atherosclerotic change at both carotid bifurcations. No stenosis on the right. 10% stenosis of the distal bulb on the left. No acute intracranial large or medium vessel occlusion. 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 Electronically Signed   By: Paulina FusiMark  Shogry M.D.   On: 07/10/2018 12:32   Mr Brain Wo Contrast  Result Date: 07/10/2018 CLINICAL DATA:  Stroke follow-up EXAM: MRI HEAD WITHOUT CONTRAST TECHNIQUE: Multiplanar, multiecho pulse sequences of the brain and surrounding structures were obtained without intravenous contrast. COMPARISON:  CTA head neck 07/10/2018 FINDINGS: BRAIN: Small focus of abnormal diffusion restriction within the left pons. No other diffusion abnormality. The midline structures are normal. Old posterior left MCA territory infarct. Multifocal white matter hyperintensity, most commonly due to chronic ischemic microangiopathy. Generalized atrophy without lobar predilection. Susceptibility-sensitive sequences show no chronic microhemorrhage or superficial siderosis. No mass lesion. VASCULAR: The major intracranial arterial and venous sinus flow voids are normal.  SKULL AND UPPER CERVICAL SPINE: Calvarial bone marrow signal is normal. There is no skull base mass. Visualized upper cervical spine and soft tissues are normal. SINUSES/ORBITS: No fluid levels or advanced mucosal thickening. No mastoid or middle ear effusion. The orbits are normal. IMPRESSION: 1. Small acute or early subacute infarct the left pons, in keeping with reported right-sided weakness. No acute hemorrhage. 2. Old posterior left MCA territory infarct encephalomalacia. 3. Generalized atrophy. Electronically Signed   By: Deatra RobinsonKevin  Herman M.D.   On: 07/10/2018 16:04   Dg Swallowing Func-speech Pathology  Result Date: 07/11/2018 Objective Swallowing Evaluation: Type of Study: MBS-Modified Barium Swallow Study  Patient Details Name: [name] ElizabethWilburn L Hashemi MRN: [id number] Date of Birth: 10/07/1927 Today's Date: 07/11/2018 Time: SLP Start Time (ACUTE ONLY): 1250 -SLP Stop Time (ACUTE ONLY): 1306 SLP Time Calculation (min) (ACUTE ONLY): 16 min Past Medical History: Past Medical History: Diagnosis Date . Atrial fibrillation (HCC)  . COPD (chronic obstructive pulmonary disease) (HCC)  . CVA (cerebral infarction)  . Diabetes mellitus without complication (HCC)  . Essential hypertension, benign  . Hard of hearing  Past Surgical History: Past Surgical History: Procedure Laterality Date . HERNIA REPAIR   . LEFT HEART CATHETERIZATION WITH CORONARY ANGIOGRAM N/A 03/28/2013  Procedure: LEFT HEART CATHETERIZATION WITH CORONARY ANGIOGRAM;  Surgeon: Peter M SwazilandJordan, MD;  [facility];  Service: Cardiovascular;  Laterality: N/A; HPI: Pt is an 85 y.o. male who presented with R sided weakness and speech difficulties. PMH is significant for Afib on  eliquis, COPD, DM, HTN, CAD, h/o CVA. 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  Subjective: alert, communicative Assessment / Plan / Recommendation CHL IP CLINICAL IMPRESSIONS 07/11/2018  Clinical Impression Pt presents with functional oropharyngeal swallow marked by prolonged, piece-meal bolusing of regular solids (ultimately effective); adequate propulsion of all materials through pharynx with no residue post-swallow.  There was adequate laryngeal vestibule closure with only occasional penetration of thin liquids (PAS #2).  Penetration did not reach the vocal folds; there was no aspiration.  13 mm barium pill passed readily without deficit.  Recommend resuming a regular diet with thin liquids; pills whole with liquid.  No SLP f/u is needed.  SLP Visit Diagnosis Dysphagia, unspecified (R13.10) Attention and concentration deficit following -- Frontal lobe and executive function deficit following -- Impact on safety and function No limitations   CHL IP TREATMENT RECOMMENDATION 07/11/2018 Treatment Recommendations No treatment recommended at this time   No flowsheet data found. CHL IP DIET RECOMMENDATION 07/11/2018 SLP Diet Recommendations Regular solids;Thin liquid Liquid Administration via Cup;Straw Medication Administration Whole meds with liquid Compensations -- Postural Changes --   CHL IP OTHER RECOMMENDATIONS 07/11/2018 Recommended Consults -- Oral Care Recommendations Oral care BID Other Recommendations Order thickener from pharmacy   CHL IP FOLLOW UP RECOMMENDATIONS 07/11/2018 Follow up Recommendations None   No flowsheet data found.     CHL IP ORAL PHASE 07/11/2018 Oral Phase Impaired Oral - Pudding Teaspoon -- Oral - Pudding Cup -- Oral - Honey Teaspoon -- Oral - Honey Cup -- Oral - Nectar Teaspoon -- Oral - Nectar Cup -- Oral - Nectar Straw -- Oral - Thin Teaspoon -- Oral - Thin Cup -- Oral - Thin Straw -- Oral - Puree -- Oral - Mech Soft -- Oral - Regular Piecemeal swallowing;Delayed oral transit Oral - Multi-Consistency -- Oral - Pill -- Oral Phase - Comment --  CHL IP PHARYNGEAL PHASE 07/11/2018 Pharyngeal Phase WFL Pharyngeal- Pudding Teaspoon -- Pharyngeal -- Pharyngeal- Pudding Cup --  Pharyngeal -- Pharyngeal- Honey Teaspoon -- Pharyngeal -- Pharyngeal- Honey Cup -- Pharyngeal -- Pharyngeal- Nectar Teaspoon -- Pharyngeal -- Pharyngeal- Nectar Cup -- Pharyngeal -- Pharyngeal- Nectar Straw -- Pharyngeal -- Pharyngeal- Thin Teaspoon -- Pharyngeal -- Pharyngeal- Thin Cup -- Pharyngeal -- Pharyngeal- Thin Straw -- Pharyngeal -- Pharyngeal- Puree -- Pharyngeal -- Pharyngeal- Mechanical Soft -- Pharyngeal -- Pharyngeal- Regular -- Pharyngeal -- Pharyngeal- Multi-consistency -- Pharyngeal -- Pharyngeal- Pill -- Pharyngeal -- Pharyngeal Comment --  CHL IP CERVICAL ESOPHAGEAL PHASE 07/11/2018 Cervical Esophageal Phase WFL Pudding Teaspoon -- Pudding Cup -- Honey Teaspoon -- Honey Cup -- Nectar Teaspoon -- Nectar Cup -- Nectar Straw -- Thin Teaspoon -- Thin Cup -- Thin Straw -- Puree -- Mechanical Soft -- Regular -- Multi-consistency -- Pill -- Cervical Esophageal Comment -- Blenda Mounts Laurice 07/11/2018, 1:15 PM              Ct Head Code Stroke Wo Contrast  Result Date: 07/10/2018 CLINICAL DATA:  Code stroke.  Slurred speech EXAM: CT HEAD WITHOUT CONTRAST TECHNIQUE: Contiguous axial images were obtained from the base of the skull through the vertex without intravenous contrast. COMPARISON:  06/13/2017 FINDINGS: Brain: No brainstem abnormality is seen. Old left cerebellar infarction. Old infarction in the left MCA territory with atrophy, encephalomalacia and gliosis of the deep insula, posterior temporal lobe and parietal lobe. No sign of acute infarction, mass lesion, hemorrhage, hydrocephalus or extra-axial collection Vascular:  There is atherosclerotic calcification of the major vessels at the base of the brain. Skull: Negative Sinuses/Orbits: Clear/normal Other: Left parotid mass as seen previously. ASPECTS [facility] Stroke Program Early CT Score) - Ganglionic level infarction (caudate, lentiform nuclei, internal capsule, insula, M1-M3 cortex): No acute insult identifiable. Extensive old infarct on  the left. - Supraganglionic infarction (M4-M6 cortex): A old infarct on the left. Total score (0-10 with 10 being normal): No acute lesion suspected. Difficult to apply given the extensive old left MCA infarction. IMPRESSION: 1. No acute finding. No apparent change since March of last year. Old infarction in the left MCA territory. 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 2. ASPECTS is difficult to apply given the extensive old left MCA stroke. 3. These results were communicated to Dr. Pearlean Brownie at 12:20 pmon 4/15/2020by text page via the [facility] messaging system. Electronically Signed   By: Paulina Fusi M.D.   On: 07/10/2018 12:20   EEG 07/10/18 Technique: This is a 21 channel routine scalp EEG performed at the bedside with bipolar and monopolar montages arranged in accordance to the international 10/20 system of electrode placement. One channel was dedicated to EKG recording.   Background: The background consists of intermixed alpha and beta activities. There is a well defined posterior dominant rhythm of 8-9 hz that attenuates with eye opening. Sleep is recorded with normal appearing structures.   Photic stimulation: Physiologic driving is performed  EEG Abnormalities: None  Clinical Interpretation: This normal EEG is recorded in the waking and sleep state. There was no seizure or seizure predisposition recorded on this study. Please note that lack of epileptiform activity on EEG does not preclude the possibility of epilepsy.    Results/Tests Pending at Time of Discharge: none  Discharge Medications:  Allergies as of 07/12/2018   No Known Allergies     Medication List    STOP taking these medications   aspirin 325 MG EC tablet   BC HEADACHE POWDER PO   benzonatate 100 MG capsule Commonly known as:  TESSALON   levofloxacin 500 MG tablet Commonly known as:  LEVAQUIN     TAKE these medications   albuterol 108 (90 Base) MCG/ACT inhaler Commonly known as:  VENTOLIN HFA Inhale 2 puffs into the  lungs every 4 (four) hours as needed for wheezing or shortness of breath.   amLODipine 5 MG tablet Commonly known as:  NORVASC Take 5 mg by mouth daily.   apixaban 5 MG Tabs tablet Commonly known as:  ELIQUIS Take 1 tablet (5 mg total) by mouth 2 (two) times daily.   atorvastatin 80 MG tablet Commonly known as:  LIPITOR Take 1 tablet (80 mg total) by mouth daily at 6 PM. What changed:    medication strength  how much to take   brimonidine 0.15 % ophthalmic solution Commonly known as:  ALPHAGAN Place 1 drop into both eyes 2 (two) times daily.   budesonide-formoterol 80-4.5 MCG/ACT inhaler Commonly known as:  SYMBICORT Inhale 2 puffs into the lungs 2 (two) times daily.   Carboxymethylcellulose Sodium 0.25 % Soln Place 2 drops into both eyes 4 (four) times daily as needed (for dry eyes).   Combivent Respimat 20-100 MCG/ACT Aers respimat Generic drug:  Ipratropium-Albuterol Inhale 1 puff into the lungs every 6 (six) hours as needed for wheezing.   ipratropium-albuterol 0.5-2.5 (3) MG/3ML Soln Commonly known as:  DUONEB Take 3 mLs by nebulization 4 (four) times daily.   eucerin cream Apply 1 application topically See admin instructions. Apply small amount to affected  area twice a day.   finasteride 5 MG tablet Commonly known as:  PROSCAR Take 5 mg by mouth daily.   hydrochlorothiazide 25 MG tablet Commonly known as:  HYDRODIURIL Take 25 mg by mouth daily.   levalbuterol 0.63 MG/3ML nebulizer solution Commonly known as:  XOPENEX Take 3 mLs (0.63 mg total) by nebulization every 6 (six) hours as needed for wheezing or shortness of breath.   levothyroxine 25 MCG tablet Commonly known as:  SYNTHROID Take 2 tablets (50 mcg total) by mouth daily before breakfast.   lisinopril 40 MG tablet Commonly known as:  ZESTRIL Take 40 mg by mouth daily.   metFORMIN 500 MG tablet Commonly known as:  GLUCOPHAGE Take 500 mg by mouth 2 (two) times daily with a meal.   metoprolol  tartrate 50 MG tablet Commonly known as:  LOPRESSOR Take 25 mg by mouth 2 (two) times daily.   multivitamin with minerals tablet Take 1 tablet by mouth daily.   nitroGLYCERIN 0.4 MG SL tablet Commonly known as:  NITROSTAT Place 0.4 mg under the tongue every 5 (five) minutes as needed for chest pain. Dissolve one tablet under the tongue every 5 minutes as needed for chest pain. Repeat every 5 minutes if needed for a total of 3 tablets in 15 minutes. If no relief, call 911.   olopatadine 0.1 % ophthalmic solution Commonly known as:  PATANOL Place 1 drop into both eyes 2 (two) times daily.   omeprazole 20 MG capsule Commonly known as:  PRILOSEC Take 20 mg by mouth daily. Marland Kitchen   senna-docusate 8.6-50 MG tablet Commonly known as:  Senokot-S Take 2 tablets by mouth at bedtime.            Durable Medical Equipment  (From admission, onward)         Start     Ordered   07/12/18 1114  For home use only DME 3 n 1  Once     07/12/18 1113          Discharge Instructions: Please refer to Patient Instructions section of EMR for full details.  Patient was counseled important signs and symptoms that should prompt return to medical care, changes in medications, dietary instructions, activity restrictions, and follow up appointments.   Follow-Up Appointments: Follow-up Information    [facility] Follow up in 4 week(s).   Specialty:  Neurology Why:  stroke clinic. office will call with appt date and time Contact information: [address] [place] [address] [phone]       Bing Neighbors, FNP Follow up.   Specialty:  Family Medicine Contact information: [address] [place] [address] [phone]           Leland Her, DO 07/12/2018, 11:25 AM PGY-3, [facility]

## 2018-07-11 NOTE — Evaluation (Signed)
Physical Therapy Evaluation Patient Details Name: [name] MRN: [id number] DOB: 30-Mar-1927 Today's Date: 07/11/2018   History of Present Illness  Pt is an 85 y.o. male who presented with R sided weakness and speech difficulties. PMH is significant for Afib on eliquis, COPD, DM, HTN, CAD, h/o CVA. 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   Clinical Impression  Pt admitted with above diagnosis. Pt currently with functional limitations due to the deficits listed below (see PT Problem List). At the time of PT eval pt was able to perform transfers with up to +2 min assist for balance support and safety. Pt is a poor historian 2 aphasia, and not able to provide very many details of home environment or PLOF. It does appear pt will have supportive son at home, and that pt has experienced a decline in function. Recommending continued rehab at the CIR level to maximize functional independence and safety prior to return home. Acutely, pt will benefit from skilled PT to increase their independence and safety with mobility to allow discharge to the venue listed below.       Follow Up Recommendations CIR;Supervision/Assistance - 24 hour    Equipment Recommendations  Other (comment)(TBD by next venue of care)    Recommendations for Other Services Rehab consult     Precautions / Restrictions Precautions Precautions: Fall Restrictions Weight Bearing Restrictions: No      Mobility  Bed Mobility Overal bed mobility: Needs Assistance Bed Mobility: Supine to Sit     Supine to sit: Min assist     General bed mobility comments: Assist for initiation and trunk elevation to full sitting position.   Transfers Overall transfer level: Needs assistance Equipment used: 2 person hand held assist Transfers: Sit to/from [employer] Sit to Stand: Min assist;+2 physical assistance          General transfer comment: Assist to power-up to full standing position. Increased time to gain/maintain standing balance prior to pivot around to chair. Pt was able to take pivotal steps around to the chair - many steps to turn in a circle to sit.   Ambulation/Gait             General Gait Details: Not able to progress ambulation at this time.   Stairs            Wheelchair Mobility    Modified Rankin (Stroke Patients Only) Modified Rankin (Stroke Patients Only) Pre-Morbid Rankin Score: Moderate disability Modified Rankin: Moderately severe disability     Balance Overall balance assessment: Needs assistance Sitting-balance support: Feet supported;No upper extremity supported Sitting balance-Leahy Scale: Fair     Standing balance support: Bilateral upper extremity supported;During functional activity Standing balance-Leahy Scale: Poor Standing balance comment: Requires UE support to maintain standing balance.                              Pertinent Vitals/Pain Pain Assessment: No/denies pain    Home Living Family/patient expects to be discharged to:: Private residence Living Arrangements: Children Available Help at Discharge: Available 24 hours/day Type of Home: House Home Access: Level entry     Home Layout: One level        Prior Function                 Hand Dominance        Extremity/Trunk Assessment  Upper Extremity Assessment Upper Extremity Assessment: Generalized weakness    Lower Extremity Assessment Lower Extremity Assessment: Generalized weakness(Appeared equal R and L)    Cervical / Trunk Assessment Cervical / Trunk Assessment: Other exceptions Cervical / Trunk Exceptions: Forward head posture with rounded shoulders  Communication      Cognition Arousal/Alertness: Awake/alert Behavior During Therapy: WFL for tasks assessed/performed Overall Cognitive Status: Difficult to assess                                         General Comments      Exercises     Assessment/Plan    PT Assessment Patient needs continued PT services  PT Problem List Decreased strength;Decreased activity tolerance;Decreased balance;Decreased mobility;Decreased knowledge of use of DME;Decreased safety awareness;Decreased knowledge of precautions       PT Treatment Interventions DME instruction;Gait training;Stair training;Functional mobility training;Therapeutic activities;Therapeutic exercise;Neuromuscular re-education;Cognitive remediation;Patient/family education    PT Goals (Current goals can be found in the Care Plan section)  Acute Rehab PT Goals Patient Stated Goal: Pt did not state goals during session PT Goal Formulation: With patient Time For Goal Achievement: 07/25/18 Potential to Achieve Goals: Good    Frequency Min 4X/week   Barriers to discharge        Co-evaluation               AM-PAC PT "6 Clicks" Mobility  Outcome Measure Help needed turning from your back to your side while in a flat bed without using bedrails?: None Help needed moving from lying on your back to sitting on the side of a flat bed without using bedrails?: A Little Help needed moving to and from a bed to a chair (including a wheelchair)?: A Little Help needed standing up from a chair using your arms (e.g., wheelchair or bedside chair)?: A Lot Help needed to walk in hospital room?: A Lot Help needed climbing 3-5 steps with a railing? : Total 6 Click Score: 15    End of Session Equipment Utilized During Treatment: Gait belt Activity Tolerance: Patient tolerated treatment well Patient left: in chair;with call bell/phone within reach;with chair alarm set Nurse Communication: Mobility status PT Visit Diagnosis: Unsteadiness on feet (R26.81);Difficulty in walking, not elsewhere classified (R26.2)    Time: [id number] PT Time Calculation (min) (ACUTE ONLY): 27 min   Charges:   PT Evaluation $PT  Eval Moderate Complexity: 1 Mod          Conni SlipperLaura Jenina Moening, PT, DPT Acute Rehabilitation Services Pager: [phone] Office: [phone]   Marylynn PearsonLaura D Ashlie Mcmenamy 07/11/2018, 1:36 PM

## 2018-07-11 NOTE — Progress Notes (Signed)
Rehab Admissions Coordinator Note:  Per PT recommendation, this patient was screened by Nanine Means for appropriateness for an Inpatient Acute Rehab Consult.  At this time, we are recommending Inpatient Rehab consult. AC will contact MD to request an IP Rehab Consult Order.   Nanine Means 07/11/2018, 1:54 PM  I can be reached at [phone].

## 2018-07-12 LAB — BASIC METABOLIC PANEL
Anion gap: 9 (ref 5–15)
BUN: 16 mg/dL (ref 8–23)
CO2: 22 mmol/L (ref 22–32)
Calcium: 9.1 mg/dL (ref 8.9–10.3)
Chloride: 107 mmol/L (ref 98–111)
Creatinine, Ser: 1.12 mg/dL (ref 0.61–1.24)
GFR calc Af Amer: >60 mL/min (ref >60)
GFR calc non Af Amer: 58 mL/min — ABNORMAL LOW (ref >60)
Glucose, Bld: 155 mg/dL — ABNORMAL HIGH (ref 70–99)
Potassium: 4.2 mmol/L (ref 3.5–5.1)
Sodium: 138 mmol/L (ref 135–145)

## 2018-07-12 LAB — CBC
HCT: 39.5 % (ref 39.0–52.0)
Hemoglobin: 12.7 g/dL — ABNORMAL LOW (ref 13.0–17.0)
MCH: 28.2 pg (ref 26.0–34.0)
MCHC: 32.2 g/dL (ref 30.0–36.0)
MCV: 87.8 fL (ref 80.0–100.0)
Platelets: 153 10*3/uL (ref 150–400)
RBC: 4.5 MIL/uL (ref 4.22–5.81)
RDW: 13.8 % (ref 11.5–15.5)
WBC: 6.5 10*3/uL (ref 4.0–10.5)
nRBC: 0 % (ref 0.0–0.2)

## 2018-07-12 LAB — GLUCOSE, CAPILLARY
Glucose-Capillary: 144 mg/dL — ABNORMAL HIGH (ref 70–99)
Glucose-Capillary: 150 mg/dL — ABNORMAL HIGH (ref 70–99)
Glucose-Capillary: 151 mg/dL — ABNORMAL HIGH (ref 70–99)

## 2018-07-12 MED ORDER — ATORVASTATIN CALCIUM 80 MG PO TABS: 80.0000 mg | ORAL_TABLET | Freq: Every day | ORAL | 0 refills | Status: DC

## 2018-07-12 NOTE — Discharge Instructions (Signed)
It has been a pleasure taking care of you! You were admitted due to stroke. Please make sure you follow up with the neurologist.     Information on my medicine - ELIQUIS (apixaban)  This medication education was reviewed with me or my healthcare representative as part of my discharge preparation.    Why was Eliquis prescribed for you? Eliquis was prescribed for you to reduce the risk of a blood clot forming that can cause a stroke if you have a medical condition called atrial fibrillation (a type of irregular heartbeat).  What do You need to know about Eliquis ? Take your Eliquis TWICE DAILY - one tablet in the morning and one tablet in the evening with or without food. If you have difficulty swallowing the tablet whole please discuss with your pharmacist how to take the medication safely.  Take Eliquis exactly as prescribed by your doctor and DO NOT stop taking Eliquis without talking to the doctor who prescribed the medication.  Stopping may increase your risk of developing a stroke.  Refill your prescription before you run out.  After discharge, you should have regular check-up appointments with your healthcare provider that is prescribing your Eliquis.  In the future your dose may need to be changed if your kidney function or weight changes by a significant amount or as you get older.  What do you do if you miss a dose? If you miss a dose, take it as soon as you remember on the same day and resume taking twice daily.  Do not take more than one dose of ELIQUIS at the same time to make up a missed dose.  Important Safety Information A possible side effect of Eliquis is bleeding. You should call your healthcare provider right away if you experience any of the following: ? Bleeding from an injury or your nose that does not stop. ? Unusual colored urine (red or dark brown) or unusual colored stools (red or black). ? Unusual bruising for unknown reasons. ? A serious fall or if you  hit your head (even if there is no bleeding).  Some medicines may interact with Eliquis and might increase your risk of bleeding or clotting while on Eliquis. To help avoid this, consult your healthcare provider or pharmacist prior to using any new prescription or non-prescription medications, including herbals, vitamins, non-steroidal anti-inflammatory drugs (NSAIDs) and supplements.  This website has more information on Eliquis (apixaban): http://www.eliquis.com/eliquis/home

## 2018-07-12 NOTE — Progress Notes (Signed)
Patient is being discharged home with home health. Family is here for transport. Nurse went over discharge paperwork with pt, all questions and concerns addressed. IV taken out. All belongings sent with patient. Pt to be taken down in wheelchair. [name]

## 2018-07-12 NOTE — Progress Notes (Signed)
Inpatient Rehabilitation-Admissions Coordinator   Discussed progress with PT and OT this AM. Both report that the pt has vastly improved functionally and would be more appropriate for [facility] therapy. Spoke to pt and his son about new recommendations, with both in agreement. AC communicated with CM regarding new dispo plans.   AC will sign off.   Please call if questions.   Nanine Means, OTR/L  Rehab Admissions Coordinator  [phone] 07/12/2018 11:07 AM

## 2018-07-12 NOTE — TOC Transition Note (Signed)
Transition of Care [facility]) - CM/SW Discharge Note   Patient Details  Name: BUSH BUEHRING MRN: [id number] Date of Birth: Sep 12, 1927  [facility] of [facility]) CM/SW Contact:  Kermit Balo, RN Phone Number: 07/12/2018, 1:16 PM   Clinical Narrative:    Pt doing too well for CIR. Son notified and in agreement with [facility] services. Family able to provide 24 hour supervision.  Pts daughter in law able to provide transport home. Bedside RN updated on the 2:30 pickup time.     Final next level of care: Home w Home Health Services Barriers to Discharge: No Barriers Identified   Patient Goals and CMS Choice Patient states their goals for this hospitalization and ongoing recovery are:: Pt unable to state CMS Medicare.gov Compare Post Acute Care list provided to:: Patient Represenative (must comment)(son) Choice offered to / list presented to : Adult Children  Discharge Placement                       Discharge Plan and Services   Discharge Planning Services: CM Consult Post Acute Care Choice: Home Health          DME Arranged: 3-N-1 DME Agency: [facility] HH Arranged: PT, OT HH Agency: [facility]Donna with [facility] accepted the referral.   Social Determinants of Health (SDOH) Interventions     Readmission Risk Interventions No flowsheet data found.

## 2018-07-12 NOTE — Progress Notes (Signed)
Physical Therapy Treatment Patient Details Name: [name] MRN: [id number] DOB: [birth year] Today's Date: 07/12/2018    History of Present Illness Pt is an 85 y.o. male who presented with R sided weakness and speech difficulties. PMH is significant for Afib on eliquis, COPD, DM, HTN, CAD, h/o CVA. 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     PT Comments    Pt progressing well towards physical therapy goals. Compared to PT evaluation yesterday, pt has demonstrated significant functional improvement. He was able to ambulate in hall without AD or UE support - min guard assist for safety. Slightly antalgic at times but overall steady. Pt continues to have decreased safety awareness, however feel he will be able to manage well at home with supportive family and HHPT to follow up. Will continue to follow.    Follow Up Recommendations  Home health PT;Supervision/Assistance - 24 hour     Equipment Recommendations       Recommendations for Other Services       Precautions / Restrictions Precautions Precautions: Fall Restrictions Weight Bearing Restrictions: No    Mobility  Bed Mobility Overal bed mobility: Needs Assistance Bed Mobility: Supine to Sit     Supine to sit: Supervision     General bed mobility comments: Slow and effortful but able to transition to EOB without rails and with HOB flat.   Transfers Overall transfer level: Needs assistance Equipment used: None Transfers: Sit to/from Stand Sit to Stand: Supervision         General transfer comment: Close supervision for safety as pt powered up to full stand.   Ambulation/Gait Ambulation/Gait assistance: Min guard Gait Distance (Feet): 250 Feet Assistive device: None Gait Pattern/deviations: Step-through pattern;Decreased stride length;Antalgic Gait velocity: Decreased Gait velocity interpretation: 1.31 -  2.62 ft/sec, indicative of limited community ambulator General Gait Details: Pt was able to ambulate in the hall without AD or UE support. Slightly antalgic but overall safe with min guard assist. Pt became more antalgic as he was rushing to get back to the room to use the bathroom.    Stairs Stairs: Yes Stairs assistance: Min guard Stair Management: Two rails;Forwards;Alternating pattern Number of Stairs: 5 General stair comments: Pt completed stairs without assistance. Min cues for safety.    Wheelchair Mobility    Modified Rankin (Stroke Patients Only) Modified Rankin (Stroke Patients Only) Pre-Morbid Rankin Score: Moderate disability Modified Rankin: Moderately severe disability     Balance Overall balance assessment: Needs assistance Sitting-balance support: Feet supported;No upper extremity supported Sitting balance-Leahy Scale: Fair     Standing balance support: Bilateral upper extremity supported;During functional activity Standing balance-Leahy Scale: Fair                              Cognition Arousal/Alertness: Awake/alert Behavior During Therapy: WFL for tasks assessed/performed Overall Cognitive Status: Impaired/Different from baseline Area of Impairment: Safety/judgement                         Safety/Judgement: Decreased awareness of safety;Decreased awareness of deficits     General Comments: Pt reports getting up and walking into the hall this morning, stating "until I got caught". Safety awareness is decreased.       Exercises      General Comments        Pertinent Vitals/Pain Pain Assessment:  No/denies pain    Home Living Family/patient expects to be discharged to:: Private residence Living Arrangements: Children Available Help at Discharge: Available 24 hours/day Type of Home: House Home Access: Level entry   Home Layout: One level        Prior Function Level of Independence: Independent          PT Goals  (current goals can now be found in the care plan section) Acute Rehab PT Goals Patient Stated Goal: Pt did not state goals during session PT Goal Formulation: With patient Time For Goal Achievement: 07/25/18 Potential to Achieve Goals: Good Progress towards PT goals: Progressing toward goals    Frequency    Min 4X/week      PT Plan Discharge plan needs to be updated    Co-evaluation              AM-PAC PT "6 Clicks" Mobility   Outcome Measure  Help needed turning from your back to your side while in a flat bed without using bedrails?: None Help needed moving from lying on your back to sitting on the side of a flat bed without using bedrails?: A Little Help needed moving to and from a bed to a chair (including a wheelchair)?: A Little Help needed standing up from a chair using your arms (e.g., wheelchair or bedside chair)?: A Little Help needed to walk in hospital room?: A Little Help needed climbing 3-5 steps with a railing? : A Little 6 Click Score: 19    End of Session Equipment Utilized During Treatment: Gait belt Activity Tolerance: Patient tolerated treatment well Patient left: Other (comment)(In bathroom with OT present) Nurse Communication: Mobility status PT Visit Diagnosis: Unsteadiness on feet (R26.81);Difficulty in walking, not elsewhere classified (R26.2)     Time: 1610-96041037-1048 PT Time Calculation (min) (ACUTE ONLY): 11 min  Charges:  $Gait Training: 8-22 mins                     [name], PT, DPT Acute Rehabilitation Services Pager: [phone] Office: [phone]    [name] 07/12/2018, 1:48 PM

## 2018-07-12 NOTE — Progress Notes (Signed)
Occupational Therapy Treatment Patient Details Name: [name] MRN: [id number] DOB: 06/13/1927 Today's Date: 07/12/2018    History of present illness Pt is an 85 y.o. male who presented with R sided weakness and speech difficulties. PMH is significant for Afib on eliquis, COPD, DM, HTN, CAD, h/o CVA. 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    OT comments  Pt progressing towards OT goals, handoff from PT at start of session. Pt performing functional mobility without AD and close minguard assist throughout; completing toileting and standing grooming ADL with minguard assist. Pt continues to require min-light modA for LB ADL due to significant effort/time to reach bil feet. Given pt progress have updated discharge recommendations, pt has good family support at home and has made good progress since initial eval. Feel pt is safe to return home (vs post acute rehab), however do recommend he have close 24hr supervision/assist as pt is still as a higher risk for falls at this time. [facility] to progress his safety and independence with ADL and mobility. Will continue to follow while he remains in acute setting.   Follow Up Recommendations  Home health OT;Supervision/Assistance - 24 hour    Equipment Recommendations  3 in 1 bedside commode(for use in shower)          Precautions / Restrictions Precautions Precautions: Fall Restrictions Weight Bearing Restrictions: No       Mobility Bed Mobility               General bed mobility comments: received up in bathroom with handoff from PT  Transfers Overall transfer level: Needs assistance Equipment used: None Transfers: Sit to/from Stand Sit to Stand: Supervision         General transfer comment: Close supervision for safety as pt powered up to full stand.     Balance Overall balance assessment: Needs  assistance Sitting-balance support: Feet supported;No upper extremity supported Sitting balance-Leahy Scale: Fair     Standing balance support: Bilateral upper extremity supported;During functional activity Standing balance-Leahy Scale: Fair                             ADL either performed or assessed with clinical judgement   ADL Overall ADL's : Needs assistance/impaired     Grooming: Min guard;Standing;Wash/dry hands Grooming Details (indicate cue type and reason): cues to locate soap, pt noted did not turn off water at end of task and walking away             Lower Body Dressing: Minimal assistance;Moderate assistance;Sit to/from stand Lower Body Dressing Details (indicate cue type and reason): pt able to doff socks seated in recliner, significant effort to don new pair, requires assist to support LLE in figure 4 technique and to start R sock over R foot, once started he was able to pull over heel Toilet Transfer: Min guard;Ambulation Toilet Transfer Details (indicate cue type and reason): close minguard for safety Toileting- Clothing Manipulation and Hygiene: Min guard;Sit to/from stand       Functional mobility during ADLs: Min guard       Vision       Perception     Praxis      Cognition Arousal/Alertness: Awake/alert Behavior During Therapy: WFL for tasks assessed/performed Overall Cognitive Status: Impaired/Different from baseline Area of Impairment: Safety/judgement  Safety/Judgement: Decreased awareness of safety;Decreased awareness of deficits     General Comments: decreased safety awareness and awareness of current deficits, was up walking in the hallway this morning unassisted (per report)         Exercises     Shoulder Instructions       General Comments      Pertinent Vitals/ Pain       Pain Assessment: No/denies pain  Home Living                                           Prior Functioning/Environment              Frequency  Min 2X/week        Progress Toward Goals  OT Goals(current goals can now be found in the care plan section)  Progress towards OT goals: Progressing toward goals  Acute Rehab OT Goals Patient Stated Goal: Pt did not state goals during session OT Goal Formulation: With patient Time For Goal Achievement: 07/25/18 Potential to Achieve Goals: Good  Plan Discharge plan needs to be updated    Co-evaluation                 AM-PAC OT "6 Clicks" Daily Activity     Outcome Measure   Help from another person eating meals?: A Little Help from another person taking care of personal grooming?: A Little Help from another person toileting, which includes using toliet, bedpan, or urinal?: A Little Help from another person bathing (including washing, rinsing, drying)?: A Little Help from another person to put on and taking off regular upper body clothing?: A Little Help from another person to put on and taking off regular lower body clothing?: A Lot 6 Click Score: 17    End of Session Equipment Utilized During Treatment: Gait belt  OT Visit Diagnosis: Unsteadiness on feet (R26.81);Muscle weakness (generalized) (M62.81)   Activity Tolerance Patient tolerated treatment well   Patient Left in chair;with call bell/phone within reach;with chair alarm set   Nurse Communication Mobility status        Time: 1443-1540 OT Time Calculation (min): 14 min  Charges: OT General Charges $OT Visit: 1 Visit OT Treatments $Self Care/Home Management : 8-22 mins  Marcy Siren, OT Supplemental Rehabilitation Services Pager [phone] Office [phone]    [name] 07/12/2018, 3:12 PM

## 2018-07-14 ENCOUNTER — Other Ambulatory Visit: Payer: Self-pay

## 2018-07-14 ENCOUNTER — Emergency Department (HOSPITAL_COMMUNITY): Payer: Medicare Other

## 2018-07-14 ENCOUNTER — Encounter (HOSPITAL_COMMUNITY): Payer: Self-pay | Admitting: Oncology

## 2018-07-14 ENCOUNTER — Inpatient Hospital Stay (HOSPITAL_COMMUNITY)
Admission: EM | Admit: 2018-07-14 | Discharge: 2018-07-16 | DRG: 948 | Disposition: A | Discharge status: Home Health | Payer: Medicare Other | Attending: Family Medicine | Admitting: Family Medicine

## 2018-07-14 DIAGNOSIS — I4891 Unspecified atrial fibrillation: Secondary | ICD-10-CM | POA: Diagnosis present

## 2018-07-14 DIAGNOSIS — Z79899 Other long term (current) drug therapy: Secondary | ICD-10-CM

## 2018-07-14 DIAGNOSIS — Z8249 Family history of ischemic heart disease and other diseases of the circulatory system: Secondary | ICD-10-CM

## 2018-07-14 DIAGNOSIS — Z7901 Long term (current) use of anticoagulants: Secondary | ICD-10-CM

## 2018-07-14 DIAGNOSIS — F0390 Unspecified dementia without behavioral disturbance: Secondary | ICD-10-CM | POA: Diagnosis present

## 2018-07-14 DIAGNOSIS — R4182 Altered mental status, unspecified: Principal | ICD-10-CM | POA: Diagnosis present

## 2018-07-14 DIAGNOSIS — E039 Hypothyroidism, unspecified: Secondary | ICD-10-CM | POA: Diagnosis present

## 2018-07-14 DIAGNOSIS — Z7984 Long term (current) use of oral hypoglycemic drugs: Secondary | ICD-10-CM

## 2018-07-14 DIAGNOSIS — J449 Chronic obstructive pulmonary disease, unspecified: Secondary | ICD-10-CM | POA: Diagnosis present

## 2018-07-14 DIAGNOSIS — I2510 Atherosclerotic heart disease of native coronary artery without angina pectoris: Secondary | ICD-10-CM | POA: Diagnosis present

## 2018-07-14 DIAGNOSIS — E119 Type 2 diabetes mellitus without complications: Secondary | ICD-10-CM | POA: Diagnosis present

## 2018-07-14 DIAGNOSIS — R262 Difficulty in walking, not elsewhere classified: Secondary | ICD-10-CM | POA: Diagnosis present

## 2018-07-14 DIAGNOSIS — R32 Unspecified urinary incontinence: Secondary | ICD-10-CM | POA: Diagnosis present

## 2018-07-14 DIAGNOSIS — H9190 Unspecified hearing loss, unspecified ear: Secondary | ICD-10-CM | POA: Diagnosis present

## 2018-07-14 DIAGNOSIS — R7989 Other specified abnormal findings of blood chemistry: Secondary | ICD-10-CM | POA: Diagnosis present

## 2018-07-14 DIAGNOSIS — Z79890 Hormone replacement therapy (postmenopausal): Secondary | ICD-10-CM

## 2018-07-14 DIAGNOSIS — H409 Unspecified glaucoma: Secondary | ICD-10-CM | POA: Diagnosis present

## 2018-07-14 DIAGNOSIS — I69351 Hemiplegia and hemiparesis following cerebral infarction affecting right dominant side: Secondary | ICD-10-CM

## 2018-07-14 DIAGNOSIS — E785 Hyperlipidemia, unspecified: Secondary | ICD-10-CM | POA: Diagnosis present

## 2018-07-14 DIAGNOSIS — I4510 Unspecified right bundle-branch block: Secondary | ICD-10-CM | POA: Diagnosis present

## 2018-07-14 DIAGNOSIS — I10 Essential (primary) hypertension: Secondary | ICD-10-CM | POA: Diagnosis present

## 2018-07-14 DIAGNOSIS — N400 Enlarged prostate without lower urinary tract symptoms: Secondary | ICD-10-CM | POA: Diagnosis present

## 2018-07-14 DIAGNOSIS — Z87891 Personal history of nicotine dependence: Secondary | ICD-10-CM

## 2018-07-14 DIAGNOSIS — I69320 Aphasia following cerebral infarction: Secondary | ICD-10-CM

## 2018-07-14 DIAGNOSIS — Z7951 Long term (current) use of inhaled steroids: Secondary | ICD-10-CM

## 2018-07-14 DIAGNOSIS — Z66 Do not resuscitate: Secondary | ICD-10-CM | POA: Diagnosis present

## 2018-07-14 LAB — CBC WITH DIFFERENTIAL/PLATELET
Abs Immature Granulocytes: 0.03 10*3/uL (ref 0.00–0.07)
Basophils Absolute: 0.1 10*3/uL (ref 0.0–0.1)
Basophils Relative: 1 %
Eosinophils Absolute: 0.5 10*3/uL (ref 0.0–0.5)
Eosinophils Relative: 7 %
HCT: 42.8 % (ref 39.0–52.0)
Hemoglobin: 13.2 g/dL (ref 13.0–17.0)
Immature Granulocytes: 1 %
Lymphocytes Relative: 21 %
Lymphs Abs: 1.4 10*3/uL (ref 0.7–4.0)
MCH: 28.1 pg (ref 26.0–34.0)
MCHC: 30.8 g/dL (ref 30.0–36.0)
MCV: 91.3 fL (ref 80.0–100.0)
Monocytes Absolute: 0.7 10*3/uL (ref 0.1–1.0)
Monocytes Relative: 11 %
Neutro Abs: 4 10*3/uL (ref 1.7–7.7)
Neutrophils Relative %: 59 %
Platelets: 168 10*3/uL (ref 150–400)
RBC: 4.69 MIL/uL (ref 4.22–5.81)
RDW: 13.8 % (ref 11.5–15.5)
WBC: 6.6 10*3/uL (ref 4.0–10.5)
nRBC: 0 % (ref 0.0–0.2)

## 2018-07-14 LAB — POCT I-STAT 7, (LYTES, BLD GAS, ICA,H+H)
Acid-Base Excess: 1 mmol/L (ref 0.0–2.0)
Bicarbonate: 25 mmol/L (ref 20.0–28.0)
Bicarbonate: 24.2 mmol/L (ref 20.0–28.0)
Bicarbonate: 25.8 mmol/L (ref 20.0–28.0)
Calcium, Ion: 1.23 mmol/L (ref 1.15–1.40)
Calcium, Ion: 1.23 mmol/L (ref 1.15–1.40)
Calcium, Ion: 1.27 mmol/L (ref 1.15–1.40)
HCT: 38 % — ABNORMAL LOW (ref 39.0–52.0)
HCT: 37 % — ABNORMAL LOW (ref 39.0–52.0)
HCT: 37 % — ABNORMAL LOW (ref 39.0–52.0)
Hemoglobin: 12.9 g/dL — ABNORMAL LOW (ref 13.0–17.0)
Hemoglobin: 12.6 g/dL — ABNORMAL LOW (ref 13.0–17.0)
Hemoglobin: 12.6 g/dL — ABNORMAL LOW (ref 13.0–17.0)
O2 Saturation: 72 %
O2 Saturation: 60 %
O2 Saturation: 91 %
Patient temperature: 98
Patient temperature: 98
Patient temperature: 98
Potassium: 3.9 mmol/L (ref 3.5–5.1)
Potassium: 4.1 mmol/L (ref 3.5–5.1)
Potassium: 4.1 mmol/L (ref 3.5–5.1)
Sodium: 139 mmol/L (ref 135–145)
Sodium: 139 mmol/L (ref 135–145)
Sodium: 139 mmol/L (ref 135–145)
TCO2: 26 mmol/L (ref 22–32)
TCO2: 25 mmol/L (ref 22–32)
TCO2: 27 mmol/L (ref 22–32)
pCO2 arterial: 40.6 mmHg (ref 32.0–48.0)
pCO2 arterial: 37.1 mmHg (ref 32.0–48.0)
pCO2 arterial: 42.2 mmHg (ref 32.0–48.0)
pH, Arterial: 7.396 (ref 7.350–7.450)
pH, Arterial: 7.393 (ref 7.350–7.450)
pH, Arterial: 7.42 (ref 7.350–7.450)
pO2, Arterial: 37 mmHg — CL (ref 83.0–108.0)
pO2, Arterial: 31 mmHg — CL (ref 83.0–108.0)
pO2, Arterial: 60 mmHg — ABNORMAL LOW (ref 83.0–108.0)

## 2018-07-14 LAB — URINALYSIS, ROUTINE W REFLEX MICROSCOPIC
Bilirubin Urine: NEGATIVE
Glucose, UA: NEGATIVE mg/dL
Hgb urine dipstick: NEGATIVE
Ketones, ur: NEGATIVE mg/dL
Leukocytes,Ua: NEGATIVE
Nitrite: NEGATIVE
Protein, ur: NEGATIVE mg/dL
Specific Gravity, Urine: 1.005 (ref 1.005–1.030)
pH: 7 (ref 5.0–8.0)

## 2018-07-14 LAB — COMPREHENSIVE METABOLIC PANEL
ALT: 13 U/L (ref 0–44)
AST: 29 U/L (ref 15–41)
Albumin: 3.6 g/dL (ref 3.5–5.0)
Alkaline Phosphatase: 41 U/L (ref 38–126)
Anion gap: 14 (ref 5–15)
BUN: 16 mg/dL (ref 8–23)
CO2: 20 mmol/L — ABNORMAL LOW (ref 22–32)
Calcium: 9.1 mg/dL (ref 8.9–10.3)
Chloride: 106 mmol/L (ref 98–111)
Creatinine, Ser: 1.26 mg/dL — ABNORMAL HIGH (ref 0.61–1.24)
GFR calc Af Amer: 59 mL/min — ABNORMAL LOW (ref >60)
GFR calc non Af Amer: 51 mL/min — ABNORMAL LOW (ref >60)
Glucose, Bld: 151 mg/dL — ABNORMAL HIGH (ref 70–99)
Potassium: 4.2 mmol/L (ref 3.5–5.1)
Sodium: 140 mmol/L (ref 135–145)
Total Bilirubin: 0.6 mg/dL (ref 0.3–1.2)
Total Protein: 6.1 g/dL — ABNORMAL LOW (ref 6.5–8.1)

## 2018-07-14 LAB — RAPID URINE DRUG SCREEN, HOSP PERFORMED
Amphetamines: NOT DETECTED
Barbiturates: NOT DETECTED
Benzodiazepines: NOT DETECTED
Cocaine: NOT DETECTED
Opiates: NOT DETECTED
Tetrahydrocannabinol: NOT DETECTED

## 2018-07-14 LAB — TROPONIN I: Troponin I: <0.03 ng/mL (ref <0.03)

## 2018-07-14 LAB — GLUCOSE, CAPILLARY
Glucose-Capillary: 113 mg/dL — ABNORMAL HIGH (ref 70–99)
Glucose-Capillary: 165 mg/dL — ABNORMAL HIGH (ref 70–99)

## 2018-07-14 MED ORDER — AMLODIPINE BESYLATE 5 MG PO TABS
5.0000 mg | ORAL_TABLET | Freq: Every day | ORAL | Status: DC
  Administered 2018-07-14 – 2018-07-16 (×3): 5 mg via ORAL
  Filled 2018-07-14 (×3): qty 1

## 2018-07-14 MED ORDER — SIMETHICONE 80 MG PO CHEW
160.0000 mg | CHEWABLE_TABLET | Freq: Four times a day (QID) | ORAL | Status: DC | PRN
  Filled 2018-07-14: qty 2

## 2018-07-14 MED ORDER — BRIMONIDINE TARTRATE 0.15 % OP SOLN
1.0000 [drp] | Freq: Two times a day (BID) | OPHTHALMIC | Status: DC
  Administered 2018-07-14 – 2018-07-16 (×4): 1 [drp] via OPHTHALMIC
  Filled 2018-07-14 (×2): qty 5

## 2018-07-14 MED ORDER — OLOPATADINE HCL 0.1 % OP SOLN
1.0000 [drp] | Freq: Two times a day (BID) | OPHTHALMIC | Status: DC
  Administered 2018-07-14 – 2018-07-16 (×4): 1 [drp] via OPHTHALMIC
  Filled 2018-07-14 (×2): qty 5

## 2018-07-14 MED ORDER — METOPROLOL TARTRATE 25 MG PO TABS
25.0000 mg | ORAL_TABLET | Freq: Two times a day (BID) | ORAL | Status: DC
  Administered 2018-07-14 – 2018-07-16 (×5): 25 mg via ORAL
  Filled 2018-07-14 (×5): qty 1

## 2018-07-14 MED ORDER — TAMSULOSIN HCL 0.4 MG PO CAPS
0.4000 mg | ORAL_CAPSULE | Freq: Every day | ORAL | Status: DC
  Administered 2018-07-14 – 2018-07-16 (×3): 0.4 mg via ORAL
  Filled 2018-07-14 (×3): qty 1

## 2018-07-14 MED ORDER — SENNOSIDES-DOCUSATE SODIUM 8.6-50 MG PO TABS
2.0000 | ORAL_TABLET | Freq: Every day | ORAL | Status: DC
  Administered 2018-07-14 – 2018-07-15 (×2): 2 via ORAL
  Filled 2018-07-14 (×2): qty 2

## 2018-07-14 MED ORDER — APIXABAN 5 MG PO TABS
5.0000 mg | ORAL_TABLET | Freq: Two times a day (BID) | ORAL | Status: DC
  Administered 2018-07-14 – 2018-07-16 (×5): 5 mg via ORAL
  Filled 2018-07-14 (×5): qty 1

## 2018-07-14 MED ORDER — ADULT MULTIVITAMIN W/MINERALS CH
1.0000 | ORAL_TABLET | Freq: Every day | ORAL | Status: DC
  Administered 2018-07-14 – 2018-07-16 (×3): 1 via ORAL
  Filled 2018-07-14 (×3): qty 1

## 2018-07-14 MED ORDER — IPRATROPIUM-ALBUTEROL 0.5-2.5 (3) MG/3ML IN SOLN: 3.0000 mL | Freq: Four times a day (QID) | RESPIRATORY_TRACT | Status: DC | PRN

## 2018-07-14 MED ORDER — LEVOTHYROXINE SODIUM 50 MCG PO TABS
50.0000 ug | ORAL_TABLET | Freq: Every day | ORAL | Status: DC
  Administered 2018-07-16: 50 ug via ORAL
  Filled 2018-07-14 (×2): qty 1

## 2018-07-14 MED ORDER — ACETAMINOPHEN 500 MG PO TABS
1000.0000 mg | ORAL_TABLET | Freq: Four times a day (QID) | ORAL | Status: DC | PRN
  Administered 2018-07-14: 1000 mg via ORAL
  Filled 2018-07-14 (×3): qty 2

## 2018-07-14 MED ORDER — NITROGLYCERIN 0.4 MG SL SUBL: 0.4000 mg | SUBLINGUAL_TABLET | SUBLINGUAL | Status: DC | PRN

## 2018-07-14 MED ORDER — FINASTERIDE 5 MG PO TABS
5.0000 mg | ORAL_TABLET | Freq: Every day | ORAL | Status: DC
  Administered 2018-07-14 – 2018-07-16 (×3): 5 mg via ORAL
  Filled 2018-07-14 (×3): qty 1

## 2018-07-14 MED ORDER — INSULIN ASPART 100 UNIT/ML ~~LOC~~ SOLN
0.0000 [IU] | Freq: Three times a day (TID) | SUBCUTANEOUS | Status: DC
  Administered 2018-07-14 – 2018-07-15 (×3): 2 [IU] via SUBCUTANEOUS
  Administered 2018-07-15 – 2018-07-16 (×2): 1 [IU] via SUBCUTANEOUS
  Administered 2018-07-16: 2 [IU] via SUBCUTANEOUS

## 2018-07-14 MED ORDER — ATORVASTATIN CALCIUM 80 MG PO TABS
80.0000 mg | ORAL_TABLET | Freq: Every day | ORAL | Status: DC
  Administered 2018-07-14 – 2018-07-15 (×2): 80 mg via ORAL
  Filled 2018-07-14 (×2): qty 1

## 2018-07-14 MED ORDER — MOMETASONE FURO-FORMOTEROL FUM 100-5 MCG/ACT IN AERO
2.0000 | INHALATION_SPRAY | Freq: Two times a day (BID) | RESPIRATORY_TRACT | Status: DC
  Administered 2018-07-14 – 2018-07-16 (×5): 2 via RESPIRATORY_TRACT
  Filled 2018-07-14: qty 8.8

## 2018-07-14 MED ORDER — ALBUTEROL SULFATE (2.5 MG/3ML) 0.083% IN NEBU: 3.0000 mL | INHALATION_SOLUTION | RESPIRATORY_TRACT | Status: DC | PRN

## 2018-07-14 MED ORDER — SODIUM CHLORIDE 0.9 % IV SOLN: INTRAVENOUS | Status: AC

## 2018-07-14 MED ORDER — HYDROCERIN EX CREA
1.0000 "application " | TOPICAL_CREAM | Freq: Two times a day (BID) | CUTANEOUS | Status: DC
  Administered 2018-07-14 – 2018-07-15 (×2): 1 via TOPICAL
  Filled 2018-07-14 (×3): qty 142

## 2018-07-14 MED ORDER — PANTOPRAZOLE SODIUM 40 MG PO TBEC
40.0000 mg | DELAYED_RELEASE_TABLET | Freq: Every day | ORAL | Status: DC
  Administered 2018-07-14 – 2018-07-16 (×3): 40 mg via ORAL
  Filled 2018-07-14 (×3): qty 1

## 2018-07-14 MED ORDER — POLYVINYL ALCOHOL 1.4 % OP SOLN: 2.0000 [drp] | Freq: Four times a day (QID) | OPHTHALMIC | Status: DC | PRN

## 2018-07-14 NOTE — ED Triage Notes (Signed)
Pt here w/ AMS.  Pt is alert to self and place only.  Pt is not able to articulate why he is here.  States, "I haven't peed, then I just let loose."  Will see if family is in waiting room to obtain more info.

## 2018-07-14 NOTE — ED Notes (Signed)
Spoke w/ pt's son [name] who reports that his dad has had several episodes of urinary incontinence, been unsteady on his feet and continues to be confused.  Pt's son states that is the reason for evaluation in the ED this evening.

## 2018-07-14 NOTE — Progress Notes (Addendum)
FPTS Interim Progress Note  S: Patient is hungry this morning stating that he has not gotten his food yet.  He states that he had an episode of urinary incontinence at home but this is when his family would not come to help him to the bathroom.  He states that he feels like he is doing great.  Patient says he does not know why he is in the hospital.  Physical therapy in the room to work with the patient  O: BP (!) 154/61 (BP Location: Right Arm)   Pulse 61   Temp 98.2 F (36.8 C) (Oral)   Resp 18   Ht 5\' 8"  (1.727 m)   Wt 72.6 kg   SpO2 98%   BMI 24.33 kg/m   General: NAD, pleasant Eyes: injected conjunctiva Cardiovascular: RRR, no m/r/g, no LE edema Respiratory: normal work of breathing on RA Gastrointestinal: soft, nontender, nondistended, normoactive BS Derm: no rashes appreciated Neuro: CN II-XII grossly intact, BLUE 4/5 strength, BLLE 4/5 strength Psych: AO to self and location, appropriate affect  A/P: ?AMS: Patient with concern of urinary incontinence as well as being disoriented however on exam this morning patient is alert and oriented to self and place however he is not oriented to time.  Patient able to urinate on his own overnight. -PT/OT evaluation -May consider repeat MRI.  [name], [place], DO 07/14/2018, 9:52 AM PGY-2, [facility] Health Family Medicine Service pager [phone]

## 2018-07-14 NOTE — H&P (Addendum)
Family Medicine Teaching [facility] Admission History and Physical Service Pager: [phone]  Patient name: [name] Medical record number: [id number] Date of birth: 02-05-1928 Age: 85 y.o. Gender: male  Primary Care Provider: Bing Neighbors, FNP Consultants: none Code Status: DNR  Chief Complaint: altered mental status  Assessment and Plan: [name] is a 85 y.o. male presenting with altered mental status. PMH is significant for prior episode of AMS, COPD, T2DM, atrial fibrillation on Eliquis, HTN, CAD, h/o recent and remote CVA's.  AMS:  Patient's son says that patient has had an unsteady gait, incontinence of urine, and some confusion since his discharge home on 4/17.  Patient has had no complaints at home and has had no other symptoms.  Was recently admitted on 4/15 and found to have an acute L pons infarct resulting in right-sided weakness and worsening of his baseline expressive aphasia.  His confusion improved by 4/17.  An EEG was performed and was negative for epileptiform activity.  On admission today, patient's vital signs are within normal limits and stable except for some elevated blood pressure and elevated respiratory rate, which could be his baseline due to COPD.  CT head is negative for acute abnormality or normal pressure hydrocephalus.  Chest x-ray and UA do not show signs of infection, and white count is normal.  Urinary retention is a possibility, although bladder scan showed only about 200 mL of urine.  Altered mental status could be delirium due to infection, another stroke, urinary retention, or waxing and waning neurocognitive decline.  Unlikely hypercapnia given patient's elevated respiratory rate.  Given patient's negative infectious work-up so far and history of prior CVAs and episodes of altered mental status, this is most likely a manifestation of neurocognitive decline. -Admit to MedSurg, attending Dr. Leveda Anna -Ambulation with assistance -Strict I's  and O's with low threshold to bladder scan if patient has not urinated in appropriate amount -Start Flomax 0.4 mg daily, continue home Proscar -Consider obtaining brain MRI, although there were no focal neuro deficits -PT/OT/SLP evaluation -Follow-up ABG  Elevated creatinine: Creatinine slightly elevated at 1.26 from measurement on 4/17 of 1.12.  BUN is wnl.  Since this does not qualify as an AKI, will continue to observe without further urine studies. -daily BMP -NS infusion @ 100 ml/hr -hold nephrotoxic meds  Bilateral eye irritation: Patient has glaucoma and an abnormality with his eyelids that causes conjunctival irritation according to his son.  [facility], but this is been pushed back due to the current COVID pandemic.  His eyes are persistently red and have increased tearing chronically. -Continue home olopatadine, brimonidine, and artificial tears  Afib: Rate controlled. EKG on admit showing sinus rhythm with paired PVCs, RBBB, and old anterior infarct - continue eliquis  Diabetes: a1c 7.9 on 07/10/18 - sSSI - hold metformin due to elevated creatinine - monitor CBGs with meals  Hyperlipidemia: LDL 150 - continue atorvastatin 80 mg daily  HTN: BP on admit 141/69. - continue home amlodipine; will hold HCTZ and lisinopril in setting of elevated creatinine  COPD, stable: Uses symbicort at home.  No wheezing or shortness of breath on exam. - dulera while admitted - prn albuterol  Hypothyroidism: Had recent dose adjustment of levothyroxine 1 month ago by PCP.  TSH on 07/11/18 was wnl at 2.574. - continue home levothyroxine  FEN/GI: carb modified diet Prophylaxis: Eliquis  Disposition: likely SNF pending PT/OT/SLP eval  History of Present Illness:  [name]  L Perin is a 85 y.o. male presenting with some increased confusion along with unsteady gait and urinary incontinence.  Patient says that he was brought in by his son today  because "I could not pee."  Patient denies any falls or any pain recently.  His son who lives with him reports that, at baseline, patient is able to perform all ADLs and does things around the house and yard, including riding the lawnmower.  He does not drive or take care of his bills.  He does not consume alcohol or any other substances.  His son says that his confusion was somewhat improved after his discharge from the hospital on 4/17, but it is still not back to baseline.  He also has developed a worsening unsteadiness in his gait and has had urinary incontinence, which is new for him.  He has been eating well and has not complained of any other symptoms.    In the ED, a bladder scan showed about 270 mL's of urine.  Patient was able to urinate a small amount and denied pelvic or abdominal pain or dysuria.  Review Of Systems: Per HPI with the following additions:   Review of Systems  Constitutional: Negative for chills, fever and malaise/fatigue.  HENT: Negative for congestion and sore throat.   Eyes: Positive for discharge (chronic) and redness (chronic).  Respiratory: Negative for cough, shortness of breath and wheezing.   Cardiovascular: Negative for chest pain and leg swelling.  Gastrointestinal: Negative for abdominal pain, constipation, diarrhea, nausea and vomiting.  Genitourinary: Negative for dysuria.  Musculoskeletal: Negative for falls.  Neurological: Negative for seizures and loss of consciousness.  Psychiatric/Behavioral: Negative for substance abuse.    Patient Active Problem List   Diagnosis Date Noted  . Altered mental status   . Stroke (HCC) 07/10/2018  . Requires supplemental oxygen 06/05/2018  . Chest pain 03/29/2018  . Atrial fibrillation (HCC) 05/20/2015  . RBBB 05/20/2015  . Prolonged Q-T interval on ECG 05/20/2015  . CAD (coronary artery disease) 05/20/2015  . History of CVA (cerebrovascular accident) 05/20/2015  . Atrial fibrillation with RVR (HCC)   .  Bronchiectasis with acute exacerbation (HCC)   . Unstable angina (HCC) 03/27/2013  . COPD exacerbation (HCC) 03/25/2013  . Diabetes mellitus type II, uncontrolled (HCC) 03/25/2013  . Hypothyroidism 03/25/2013  . BPH (benign prostatic hyperplasia) 03/25/2013  . HTN (hypertension) 03/25/2013    Past Medical History: Past Medical History:  Diagnosis Date  . Atrial fibrillation (HCC)   . COPD (chronic obstructive pulmonary disease) (HCC)   . CVA (cerebral infarction)   . Diabetes mellitus without complication (HCC)   . Essential hypertension, benign   . Hard of hearing     Past Surgical History: Past Surgical History:  Procedure Laterality Date  . HERNIA REPAIR    . LEFT HEART CATHETERIZATION WITH CORONARY ANGIOGRAM N/A 03/28/2013   Procedure: LEFT HEART CATHETERIZATION WITH CORONARY ANGIOGRAM;  Surgeon: Peter M [place], MD;  Location[facility] C[facility] LAB;  Service: Cardiovascular;  Laterality: N/A;    Social History: Social History   Tobacco Use  . Smoking status: Former Smoker    Types: Cigarettes  . Smokeless tobacco: Former Engineer, water Use Topics  . Alcohol use: No  . Drug use: No   Additional social history:   Please also refer to relevant sections of EMR.  Family History: Family History  Problem Relation Age of Onset  . CAD Son   . Skin cancer Brother    (If not completed,  MUST add something in)  Allergies and Medications: No Known Allergies No current facility-administered medications on file prior to encounter.    Current Outpatient Medications on File Prior to Encounter  Medication Sig Dispense Refill  . albuterol (PROVENTIL HFA;VENTOLIN HFA) 108 (90 Base) MCG/ACT inhaler Inhale 2 puffs into the lungs every 4 (four) hours as needed for wheezing or shortness of breath. 1 Inhaler 0  . amLODipine (NORVASC) 5 MG tablet Take 5 mg by mouth daily.    Marland Kitchen apixaban (ELIQUIS) 5 MG TABS tablet Take 1 tablet (5 mg total) by mouth 2 (two) times daily. 60 tablet 0  .  atorvastatin (LIPITOR) 80 MG tablet Take 1 tablet (80 mg total) by mouth daily at 6 PM. 30 tablet 0  . brimonidine (ALPHAGAN) 0.15 % ophthalmic solution Place 1 drop into both eyes 2 (two) times daily.     . budesonide-formoterol (SYMBICORT) 80-4.5 MCG/ACT inhaler Inhale 2 puffs into the lungs 2 (two) times daily.    . Carboxymethylcellulose Sodium 0.25 % SOLN Place 2 drops into both eyes 4 (four) times daily as needed (for dry eyes).     . finasteride (PROSCAR) 5 MG tablet Take 5 mg by mouth daily.    . hydrochlorothiazide (HYDRODIURIL) 25 MG tablet Take 25 mg by mouth daily.     . Ipratropium-Albuterol (COMBIVENT RESPIMAT) 20-100 MCG/ACT AERS respimat Inhale 1 puff into the lungs every 6 (six) hours as needed for wheezing.     Marland Kitchen ipratropium-albuterol (DUONEB) 0.5-2.5 (3) MG/3ML SOLN Take 3 mLs by nebulization 4 (four) times daily.    Marland Kitchen levalbuterol (XOPENEX) 0.63 MG/3ML nebulizer solution Take 3 mLs (0.63 mg total) by nebulization every 6 (six) hours as needed for wheezing or shortness of breath. 3 mL 12  . levothyroxine (SYNTHROID, LEVOTHROID) 25 MCG tablet Take 2 tablets (50 mcg total) by mouth daily before breakfast. 60 tablet 3  . lisinopril (PRINIVIL,ZESTRIL) 40 MG tablet Take 40 mg by mouth daily.    . metFORMIN (GLUCOPHAGE) 500 MG tablet Take 500 mg by mouth 2 (two) times daily with a meal.     . metoprolol (LOPRESSOR) 50 MG tablet Take 25 mg by mouth 2 (two) times daily.    . Multiple Vitamins-Minerals (MULTIVITAMIN WITH MINERALS) tablet Take 1 tablet by mouth daily.    . nitroGLYCERIN (NITROSTAT) 0.4 MG SL tablet Place 0.4 mg under the tongue every 5 (five) minutes as needed for chest pain. Dissolve one tablet under the tongue every 5 minutes as needed for chest pain. Repeat every 5 minutes if needed for a total of 3 tablets in 15 minutes. If no relief, call 911.    . olopatadine (PATANOL) 0.1 % ophthalmic solution Place 1 drop into both eyes 2 (two) times daily. 5 mL 12  . omeprazole  (PRILOSEC) 20 MG capsule Take 20 mg by mouth daily. .    . senna-docusate (SENOKOT-S) 8.6-50 MG tablet Take 2 tablets by mouth at bedtime. 60 tablet 3  . Skin Protectants, Misc. (EUCERIN) cream Apply 1 application topically See admin instructions. Apply small amount to affected area twice a day.      Objective: BP (!) 130/99   Pulse 67   Temp 98 F (36.7 C) (Oral)   Resp (!) 26   Ht 5\' 8"  (1.727 m)   Wt 72.6 kg   SpO2 94%   BMI 24.33 kg/m  Physical Exam Constitutional:      General: He is not in acute distress.    Appearance: He is  not ill-appearing.  HENT:     Head: Normocephalic and atraumatic.     Nose: No congestion or rhinorrhea.     Mouth/Throat:     Mouth: Mucous membranes are moist.  Eyes:     Extraocular Movements: Extraocular movements intact.     Comments: Injected conjunctiva bilaterally with increased tearing  Neck:     Musculoskeletal: Normal range of motion.  Cardiovascular:     Rate and Rhythm: Normal rate. Rhythm irregular.     Pulses: Normal pulses.     Comments: Distant heart sounds Pulmonary:     Effort: Pulmonary effort is normal. No respiratory distress.     Breath sounds: Normal breath sounds. No wheezing, rhonchi or rales.  Abdominal:     General: Abdomen is flat. Bowel sounds are normal. There is no distension.     Palpations: Abdomen is soft.     Tenderness: There is no abdominal tenderness.  Musculoskeletal: Normal range of motion.        General: No swelling, tenderness or signs of injury.     Right lower leg: No edema.     Left lower leg: No edema.  Skin:    General: Skin is warm and dry.     Findings: No bruising, lesion or rash.  Neurological:     Mental Status: He is alert. Mental status is at baseline.     Comments: Oriented to self and place, able to answer most questions appropriately, some word finding difficulties and garbled speech, but mostly intelligible.  Slightly decreased grip strength on R, 4/5 strength in bilateral upper  and lower extremities, no pronator drift  Psychiatric:        Mood and Affect: Mood normal.      Labs and Imaging: CBC BMET  Recent Labs  Lab 07/14/18 0301  WBC 6.6  HGB 13.2  HCT 42.8  PLT 168   Recent Labs  Lab 07/14/18 0301  NA 140  K 4.2  CL 106  CO2 20*  BUN 16  CREATININE 1.26*  GLUCOSE 151*  CALCIUM 9.1     Ct Head Wo Contrast  Result Date: 07/14/2018 CLINICAL DATA:  85 y/o  M; altered mental status. History of CVA. EXAM: CT HEAD WITHOUT CONTRAST TECHNIQUE: Contiguous axial images were obtained from the base of the skull through the vertex without intravenous contrast. COMPARISON:  07/10/2018 CT of head and MRI of head. FINDINGS: Brain: Small lucency within the left hemi pons corresponding to infarction on prior MRI of the head is stable in distribution. Chronic infarcts are present within the left superior cerebellar hemisphere in the left MCA distribution. No new finding of stroke, hemorrhage, extra-axial collection, hydrocephalus, or herniation. Background of mild chronic microvascular ischemic changes and moderate volume loss of the brain. Vascular: Calcific atherosclerosis of the carotid siphons. No hyperdense vessel identified. Skull: Normal. Negative for fracture or focal lesion. Sinuses/Orbits: Mild frontal and ethmoid sinus mucosal thickening. Normal aeration of mastoid air cells. Bilateral intra-ocular lens replacement. Other: None. IMPRESSION: 1. No new acute intracranial abnormality identified. 2. Small lucency within left hemi pons corresponding to recent infarction on prior MRI is stable in distribution. 3. Stable chronic infarcts within left superior cerebellar hemisphere and left MCA distribution. 4. Stable chronic microvascular ischemic changes and volume loss of the brain. Electronically Signed   By: Mitzi Hansen M.D.   On: 07/14/2018 03:33   Dg Chest Portable 1 View  Result Date: 07/14/2018 CLINICAL DATA:  85 y/o  M; episodes of altered  mental  status. EXAM: PORTABLE CHEST 1 VIEW COMPARISON:  07/10/2018 chest radiograph. FINDINGS: Normal cardiac silhouette. Aortic atherosclerosis with calcification. Stable chronic interstitial changes, hyperinflated lungs, emphysema. No focal consolidation. No pleural effusion or pneumothorax. No acute osseous abnormality is evident. IMPRESSION: Stable findings of COPD.  No acute pulmonary process identified. Electronically Signed   By: Mitzi HansenLance  Furusawa-Stratton M.D.   On: 07/14/2018 03:30    Lennox SoldersWinfrey, Amanda C, MD 07/14/2018, 4:43 AM PGY-2, [facility] FPTS Intern pager: [phone], text pages welcome

## 2018-07-14 NOTE — Progress Notes (Addendum)
FPTS Interim Progress Note  Went to evaluate patient given possible change from this morning.  Patient on chart review has baseline expressive aphasia.  Patient seems to be complaining of either bladder pain or stomach pain but is alert and oriented to self and place. Able to state that he was able to walk to the bathroom and into the hallway earlier today. Patient is on Protonix for GERD.  Nurse states that patient urinated with tech in the toilet without measuring and had 125 cc measured urine this am. Will also perform bladder scan at this time.  No focal findings on exam.  Will await OT and SLP evaluations.  Xan Sparkman, [place], DO 07/14/2018, 3:53 PM PGY-2, [facility] Service pager [phone]

## 2018-07-14 NOTE — ED Provider Notes (Signed)
[facility] Provider [facility]   CSN: [id number] Arrival date & time: 07/14/18  0224    History   Chief Complaint Chief Complaint  Patient presents with   Altered Mental Status    HPI [name] is a 85 y.o. male.     The history is provided by a relative. The history is limited by the condition of the patient.  Altered Mental Status  Presenting symptoms: confusion and lethargy   Presenting symptoms comment:  Difficulty ambulating Severity:  Severe Most recent episode:  Today Episode history:  Single Timing:  Constant Progression:  Unchanged Chronicity:  Recurrent Context: not drug use and not head injury   Associated symptoms: no difficulty breathing, no fever, no rash and no seizures     Past Medical History:  Diagnosis Date   Atrial fibrillation (HCC)    COPD (chronic obstructive pulmonary disease) (HCC)    CVA (cerebral infarction)    Diabetes mellitus without complication (HCC)    Essential hypertension, benign    Hard of hearing     Patient Active Problem List   Diagnosis Date Noted   Altered mental status    Stroke (HCC) 07/10/2018   Requires supplemental oxygen 06/05/2018   Chest pain 03/29/2018   Atrial fibrillation (HCC) 05/20/2015   RBBB 05/20/2015   Prolonged Q-T interval on ECG 05/20/2015   CAD (coronary artery disease) 05/20/2015   History of CVA (cerebrovascular accident) 05/20/2015   Atrial fibrillation with RVR (HCC)    Bronchiectasis with acute exacerbation (HCC)    Unstable angina (HCC) 03/27/2013   COPD exacerbation (HCC) 03/25/2013   Diabetes mellitus type II, uncontrolled (HCC) 03/25/2013   Hypothyroidism 03/25/2013   BPH (benign prostatic hyperplasia) 03/25/2013   HTN (hypertension) 03/25/2013    Past Surgical History:  Procedure Laterality Date   HERNIA REPAIR     LEFT HEART CATHETERIZATION WITH CORONARY ANGIOGRAM N/A 03/28/2013   Procedure: LEFT HEART  CATHETERIZATION WITH CORONARY ANGIOGRAM;  Surgeon: Peter M SwazilandJordan, MD;  [facility] C[facility] LAB;  Service: Cardiovascular;  Laterality: N/A;        Home Medications    Prior to Admission medications   Medication Sig Start Date End Date Taking? Authorizing Provider  albuterol (PROVENTIL HFA;VENTOLIN HFA) 108 (90 Base) MCG/ACT inhaler Inhale 2 puffs into the lungs every 4 (four) hours as needed for wheezing or shortness of breath. 05/17/15   Horton, Mayer Maskerourtney F, MD  amLODipine (NORVASC) 5 MG tablet Take 5 mg by mouth daily.    [provider]  apixaban (ELIQUIS) 5 MG TABS tablet Take 1 tablet (5 mg total) by mouth 2 (two) times daily. 02/03/18   Marguerita MerlesSheikh, Omair Latif, DO  atorvastatin (LIPITOR) 80 MG tablet Take 1 tablet (80 mg total) by mouth daily at 6 PM. 07/12/18   Leland HerYoo, Elsia J, DO  brimonidine (ALPHAGAN) 0.15 % ophthalmic solution Place 1 drop into both eyes 2 (two) times daily.     [provider]  budesonide-formoterol (SYMBICORT) 80-4.5 MCG/ACT inhaler Inhale 2 puffs into the lungs 2 (two) times daily.    [provider]  Carboxymethylcellulose Sodium 0.25 % SOLN Place 2 drops into both eyes 4 (four) times daily as needed (for dry eyes).     [provider]  finasteride (PROSCAR) 5 MG tablet Take 5 mg by mouth daily.    [provider]  hydrochlorothiazide (HYDRODIURIL) 25 MG tablet Take 25 mg by mouth daily.     [provider]  Ipratropium-Albuterol (  COMBIVENT RESPIMAT) 20-100 MCG/ACT AERS respimat Inhale 1 puff into the lungs every 6 (six) hours as needed for wheezing.     [provider]  ipratropium-albuterol (DUONEB) 0.5-2.5 (3) MG/3ML SOLN Take 3 mLs by nebulization 4 (four) times daily.    [provider]  levalbuterol Pauline Aus) 0.63 MG/3ML nebulizer solution Take 3 mLs (0.63 mg total) by nebulization every 6 (six) hours as needed for wheezing or shortness of breath. 02/03/18   Marguerita Merles Latif, DO  levothyroxine  (SYNTHROID, LEVOTHROID) 25 MCG tablet Take 2 tablets (50 mcg total) by mouth daily before breakfast. 06/11/18   Bing Neighbors, FNP  lisinopril (PRINIVIL,ZESTRIL) 40 MG tablet Take 40 mg by mouth daily.    [provider]  metFORMIN (GLUCOPHAGE) 500 MG tablet Take 500 mg by mouth 2 (two) times daily with a meal.     [provider]  metoprolol (LOPRESSOR) 50 MG tablet Take 25 mg by mouth 2 (two) times daily.    [provider]  Multiple Vitamins-Minerals (MULTIVITAMIN WITH MINERALS) tablet Take 1 tablet by mouth daily.    [provider]  nitroGLYCERIN (NITROSTAT) 0.4 MG SL tablet Place 0.4 mg under the tongue every 5 (five) minutes as needed for chest pain. Dissolve one tablet under the tongue every 5 minutes as needed for chest pain. Repeat every 5 minutes if needed for a total of 3 tablets in 15 minutes. If no relief, call 911.    [provider]  olopatadine (PATANOL) 0.1 % ophthalmic solution Place 1 drop into both eyes 2 (two) times daily. 06/05/18   Bing Neighbors, FNP  omeprazole (PRILOSEC) 20 MG capsule Take 20 mg by mouth daily. .    [provider]  senna-docusate (SENOKOT-S) 8.6-50 MG tablet Take 2 tablets by mouth at bedtime. 06/05/18   Bing Neighbors, FNP  Skin Protectants, Misc. (EUCERIN) cream Apply 1 application topically See admin instructions. Apply small amount to affected area twice a day.    [provider]    Family History Family History  Problem Relation Age of Onset   CAD Son    Skin cancer Brother     Social History Social History   Tobacco Use   Smoking status: Former Smoker    Types: Cigarettes   Smokeless tobacco: Former Neurosurgeon  Substance Use Topics   Alcohol use: No   Drug use: No     Allergies   Patient has no known allergies.   Review of Systems Review of Systems  Unable to perform ROS: Mental status change  Constitutional: Negative for fever.  Respiratory: Negative for  cough.   Cardiovascular: Negative for chest pain.  Musculoskeletal: Positive for gait problem.  Skin: Negative for rash.  Neurological: Negative for seizures.  Psychiatric/Behavioral: Positive for confusion.     Physical Exam Updated Vital Signs BP (!) 138/59    Pulse 61    Temp 98 F (36.7 C) (Oral)    Resp (!) 23    Ht  (1.727 m)    Wt 72.6 kg    SpO2 95%    BMI 24.33 kg/m   Physical Exam Vitals signs and nursing note reviewed.  Constitutional:      Appearance: Normal appearance.  HENT:     Head: Atraumatic.     Nose: Nose normal.  Eyes:     Conjunctiva/sclera: Conjunctivae normal.     Pupils: Pupils are equal, round, and reactive to light.  Neck:     Musculoskeletal: Normal  range of motion and neck supple.  Cardiovascular:     Rate and Rhythm: Normal rate and regular rhythm.     Pulses: Normal pulses.     Heart sounds: Normal heart sounds.  Pulmonary:     Effort: Pulmonary effort is normal.     Breath sounds: Normal breath sounds.  Abdominal:     General: Abdomen is flat.     Palpations: Abdomen is soft.     Tenderness: There is no abdominal tenderness. There is no guarding or rebound.  Musculoskeletal: Normal range of motion.     Right lower leg: No edema.     Left lower leg: No edema.  Skin:    General: Skin is warm and dry.     Capillary Refill: Capillary refill takes less than 2 seconds.  Neurological:     Mental Status: He is alert. He is disoriented.     GCS: GCS eye subscore is 4. GCS verbal subscore is 4. GCS motor subscore is 6.     Motor: No tremor.     Deep Tendon Reflexes: Reflexes normal.      ED Treatments / Results  Labs (all labs ordered are listed, but only abnormal results are displayed) Labs Reviewed  COMPREHENSIVE METABOLIC PANEL - Abnormal; Notable for the following components:      Result Value   CO2 20 (*)    Glucose, Bld 151 (*)    Creatinine, Ser 1.26 (*)    Total Protein 6.1 (*)    GFR calc non Af Amer 51 (*)    GFR  calc Af Amer 59 (*)    All other components within normal limits  CBC WITH DIFFERENTIAL/PLATELET  TROPONIN I  URINALYSIS, ROUTINE W REFLEX MICROSCOPIC  BLOOD GAS, ARTERIAL  RAPID URINE DRUG SCREEN, HOSP PERFORMED    EKG EKG Interpretation  Date/Time:  Sunday Brenyn Petrey 19 2020 02:41:27 EDT Ventricular Rate:  90 PR Interval:    QRS Duration: 145 QT Interval:  419 QTC Calculation: 394 R Axis:   -84 Text Interpretation:  Sinus rhythm Paired ventricular premature complexes Right bundle branch block Anterior infarct, old Confirmed by Nicanor Alcon, Evette Diclemente ([id number]) on 07/14/2018 3:40:35 AM   Radiology Ct Head Wo Contrast  Result Date: 07/14/2018 CLINICAL DATA:  85 y/o  M; altered mental status. History of CVA. EXAM: CT HEAD WITHOUT CONTRAST TECHNIQUE: Contiguous axial images were obtained from the base of the skull through the vertex without intravenous contrast. COMPARISON:  07/10/2018 CT of head and MRI of head. FINDINGS: Brain: Small lucency within the left hemi pons corresponding to infarction on prior MRI of the head is stable in distribution. Chronic infarcts are present within the left superior cerebellar hemisphere in the left MCA distribution. No new finding of stroke, hemorrhage, extra-axial collection, hydrocephalus, or herniation. Background of mild chronic microvascular ischemic changes and moderate volume loss of the brain. Vascular: Calcific atherosclerosis of the carotid siphons. No hyperdense vessel identified. Skull: Normal. Negative for fracture or focal lesion. Sinuses/Orbits: Mild frontal and ethmoid sinus mucosal thickening. Normal aeration of mastoid air cells. Bilateral intra-ocular lens replacement. Other: None. IMPRESSION: 1. No new acute intracranial abnormality identified. 2. Small lucency within left hemi pons corresponding to recent infarction on prior MRI is stable in distribution. 3. Stable chronic infarcts within left superior cerebellar hemisphere and left MCA distribution. 4.  Stable chronic microvascular ischemic changes and volume loss of the brain. Electronically Signed   By: Mitzi Hansen M.D.   On: 07/14/2018 03:33   Dg  Chest Portable 1 View  Result Date: 07/14/2018 CLINICAL DATA:  85 y/o  M; episodes of altered mental status. EXAM: PORTABLE CHEST 1 VIEW COMPARISON:  07/10/2018 chest radiograph. FINDINGS: Normal cardiac silhouette. Aortic atherosclerosis with calcification. Stable chronic interstitial changes, hyperinflated lungs, emphysema. No focal consolidation. No pleural effusion or pneumothorax. No acute osseous abnormality is evident. IMPRESSION: Stable findings of COPD.  No acute pulmonary process identified. Electronically Signed   By: Mitzi Hansen M.D.   On: 07/14/2018 03:30    Procedures Procedures (including critical care time)  Medications Ordered in ED Medications - No data to display     Final Clinical Impressions(s) / ED Diagnoses   Will admit for ams   Gilles Trimpe, MD 07/14/18 [phone]

## 2018-07-14 NOTE — Evaluation (Signed)
Physical Therapy Evaluation Patient Details Name: [name] MRN: [id number] DOB: 05-28-1927 Today's Date: 07/14/2018   History of Present Illness  [name] is a 85 y.o. male presenting with altered mental status. PMH is significant for prior episode of AMS, COPD, T2DM, atrial fibrillation on Eliquis, HTN, CAD, h/o recent and remote CVA's. Pt with AMS, increased unsteadiness, and urinary incontinence.   Clinical Impression  Pt admitted with above diagnosis. Pt currently with functional limitations due to the deficits listed below (see PT Problem List). Pt able to get to EOB and rise to standing without physical assist and no LOB. Pt ambulated 350' with RW and min-guard A. He relays that during episode at home he could not get to EOB and fell when trying to walk and had to be assisted up. At current functional level should be able to d/c home with HHPT but question family's ability to care for him given quick return.  Pt will benefit from skilled PT to increase their independence and safety with mobility to allow discharge to the venue listed below.       Follow Up Recommendations Home health PT;Supervision/Assistance - 24 hour    Equipment Recommendations  Rolling walker with 5" wheels    Recommendations for Other Services       Precautions / Restrictions Precautions Precautions: Fall Precaution Comments: pt reports that he fell at home because he couldn't walk and needed help to get up Restrictions Weight Bearing Restrictions: No      Mobility  Bed Mobility Overal bed mobility: Needs Assistance Bed Mobility: Supine to Sit     Supine to sit: Supervision     General bed mobility comments: pt able to remove covers and get to EOB without assist (he mentions that he could not do this the other day when this "Episode" happened  Transfers Overall transfer level: Needs assistance Equipment used: Rolling walker (2 wheeled) Transfers: Sit to/from Stand Sit to Stand:  Supervision         General transfer comment: supervision for safety but pt did not need physical assist  Ambulation/Gait Ambulation/Gait assistance: Min guard Gait Distance (Feet): 350 Feet Assistive device: Rolling walker (2 wheeled) Gait Pattern/deviations: Step-through pattern;Decreased stride length Gait velocity: Decreased Gait velocity interpretation: 1.31 - 2.62 ft/sec, indicative of limited community ambulator General Gait Details: occasional cues to stay close to RW and attend to walls or obstacles in hallway  Stairs            Wheelchair Mobility    Modified Rankin (Stroke Patients Only)       Balance Overall balance assessment: Needs assistance Sitting-balance support: Feet supported;No upper extremity supported Sitting balance-Leahy Scale: Good     Standing balance support: Bilateral upper extremity supported;During functional activity Standing balance-Leahy Scale: Poor Standing balance comment: Requires UE support to maintain standing balance.                              Pertinent Vitals/Pain Pain Assessment: No/denies pain    Home Living Family/patient expects to be discharged to:: Private residence Living Arrangements: Children Available Help at Discharge: Available 24 hours/day Type of Home: House Home Access: Level entry     Home Layout: One level        Prior Function Level of Independence: Independent               Hand Dominance        Extremity/Trunk Assessment  Upper Extremity Assessment Upper Extremity Assessment: RUE deficits/detail RUE Deficits / Details: discoordination noted in RUE (compared to LUE), noted last admission RUE Coordination: decreased fine motor    Lower Extremity Assessment Lower Extremity Assessment: Overall WFL for tasks assessed    Cervical / Trunk Assessment Cervical / Trunk Assessment: Other exceptions Cervical / Trunk Exceptions: Forward head posture with rounded shoulders   Communication   Communication: HOH;Expressive difficulties  Cognition Arousal/Alertness: Awake/alert Behavior During Therapy: WFL for tasks assessed/performed Overall Cognitive Status: History of cognitive impairments - at baseline                                        General Comments General comments (skin integrity, edema, etc.): VSS    Exercises     Assessment/Plan    PT Assessment Patient needs continued PT services  PT Problem List Decreased strength;Decreased activity tolerance;Decreased balance;Decreased mobility;Decreased knowledge of use of DME;Decreased safety awareness;Decreased knowledge of precautions       PT Treatment Interventions DME instruction;Gait training;Stair training;Functional mobility training;Therapeutic activities;Therapeutic exercise;Neuromuscular re-education;Cognitive remediation;Patient/family education    PT Goals (Current goals can be found in the Care Plan section)  Acute Rehab PT Goals Patient Stated Goal: Pt did not state goals during session PT Goal Formulation: With patient Time For Goal Achievement: 07/28/18 Potential to Achieve Goals: Good    Frequency Min 3X/week   Barriers to discharge Decreased caregiver support given pt's quick return, question support available from family    Co-evaluation               AM-PAC PT "6 Clicks" Mobility  Outcome Measure Help needed turning from your back to your side while in a flat bed without using bedrails?: None Help needed moving from lying on your back to sitting on the side of a flat bed without using bedrails?: A Little Help needed moving to and from a bed to a chair (including a wheelchair)?: A Little Help needed standing up from a chair using your arms (e.g., wheelchair or bedside chair)?: A Little Help needed to walk in hospital room?: A Little Help needed climbing 3-5 steps with a railing? : A Little 6 Click Score: 19    End of Session Equipment Utilized  During Treatment: Gait belt Activity Tolerance: Patient tolerated treatment well Patient left: in chair;with chair alarm set;with call bell/phone within reach Nurse Communication: Mobility status PT Visit Diagnosis: Unsteadiness on feet (R26.81);Difficulty in walking, not elsewhere classified (R26.2)    Time: [id number] PT Time Calculation (min) (ACUTE ONLY): 21 min   Charges:   PT Evaluation $PT Eval Moderate Complexity: 1 Mod          [name], PT  Acute Rehab Services  Pager [phone] Office [phone]   [name] 07/14/2018, 1:01 PM

## 2018-07-15 DIAGNOSIS — R4182 Altered mental status, unspecified: Secondary | ICD-10-CM | POA: Diagnosis not present

## 2018-07-15 LAB — BASIC METABOLIC PANEL
Anion gap: 7 (ref 5–15)
BUN: 18 mg/dL (ref 8–23)
CO2: 28 mmol/L (ref 22–32)
Calcium: 9.5 mg/dL (ref 8.9–10.3)
Chloride: 102 mmol/L (ref 98–111)
Creatinine, Ser: 1.31 mg/dL — ABNORMAL HIGH (ref 0.61–1.24)
GFR calc Af Amer: 56 mL/min — ABNORMAL LOW (ref >60)
GFR calc non Af Amer: 48 mL/min — ABNORMAL LOW (ref >60)
Glucose, Bld: 176 mg/dL — ABNORMAL HIGH (ref 70–99)
Potassium: 4.4 mmol/L (ref 3.5–5.1)
Sodium: 137 mmol/L (ref 135–145)

## 2018-07-15 LAB — GLUCOSE, CAPILLARY
Glucose-Capillary: 162 mg/dL — ABNORMAL HIGH (ref 70–99)
Glucose-Capillary: 171 mg/dL — ABNORMAL HIGH (ref 70–99)
Glucose-Capillary: 154 mg/dL — ABNORMAL HIGH (ref 70–99)

## 2018-07-15 LAB — CBC
HCT: 41.1 % (ref 39.0–52.0)
Hemoglobin: 13 g/dL (ref 13.0–17.0)
MCH: 28.1 pg (ref 26.0–34.0)
MCHC: 31.6 g/dL (ref 30.0–36.0)
MCV: 88.8 fL (ref 80.0–100.0)
Platelets: 166 10*3/uL (ref 150–400)
RBC: 4.63 MIL/uL (ref 4.22–5.81)
RDW: 13.7 % (ref 11.5–15.5)
WBC: 6.4 10*3/uL (ref 4.0–10.5)
nRBC: 0 % (ref 0.0–0.2)

## 2018-07-15 NOTE — Progress Notes (Signed)
Family Medicine Teaching Service Daily Progress Note Intern Pager: [phone]  Patient name: [name] Medical record number: [id number] Date of birth: December 30, 1927 Age: 85 y.o. Gender: male  Primary Care Provider: Bing Neighbors, FNP Consultants: PT/OT/SLP Code Status: DNR  Pt Overview and Major Events to Date:  4/15: Admit for R-sided weakess d/t L-pontine infarct 4/17: PT/OT rec HHPT/OT, stable for d/c 4/19: Bounce back for persistent AMS, ataxia, urinary incontinence, CT w/o new changes 4/20: CSW re-consulted for consideration of SNF, pt son [name] in agreement  Assessment and Plan: [name] is a 85 y.o. male presenting with altered mental status. PMH is significant for prior episode of AMS, COPD, T2DM, atrial fibrillation on Eliquis, HTN, CAD, h/o recent and remote CVA's.  1.  AMS in the setting of acute left pons infarct  chronic expressive aphasia: Stable.  Neuro exam significant for improved RUE strength, otherwise unremarkable.  PT evaluation did not reveal loss of balance which was main concern since discharge prompting readmission along with continued confusion and urinary incontinence.  Repeat CT without acute changes from prior.  Likely has some underlying dementia given history of neurocognitive decline.  My main concern is patient's home environment son is not conducive for rehabilitation.  Family in agreement.  We will have PT reassess for consideration of SNF.  I do believe this would be best for [name]. - PT/OT/SLP consulted, appreciate recommendations plans to reassess for possible SNF - Would consider MRI brain if there are new focal changes - Social work consulted, appreciate recommendations  2.  Elevated creatinine: Slightly up at 1.31 compared to 1.26 on admission (BL 1.0).  Not meeting criteria for AKI.  Does have history of urinary retention, however appears to be spontaneously voiding. - Avoid nephrotoxic agents - Bladder scans if concerns of  urinary retention - Strict I's and O's  3.  Bilateral eye irritation: Patient has glaucoma and an abnormality with his eyelids that causes conjunctival irritation according to his son.  [facility], but this is been pushed back due to the current COVID pandemic.  His eyes are persistently red and have increased tearing chronically. -Continue home olopatadine, brimonidine, and artificial tears  4.  Afib: Rate controlled. EKG on admit showing sinus rhythm with paired PVCs, RBBB, and old anterior infarct - continue eliquis  5.  Diabetes: a1c 7.9 on 07/10/18 - sSSI - hold metformin due to elevated creatinine - monitor CBGs with meals  6.  Hyperlipidemia:LDL 150 - continue atorvastatin 80 mg daily  7.  HTN: Stable. Systolic elevated 160s, diastolic 50s. Would avoid aggressive management given concern regarding h/o falls at home. - continue home amlodipine; will hold HCTZ and lisinopril in setting of elevated creatinine  8.  COPD:Stable. Uses symbicort at home.  No wheezing or shortness of breath on exam. - dulera while admitted - prn albuterol  9.  Hypothyroidism: Had recent dose adjustment of levothyroxine 1 month ago by PCP.  TSH on 07/11/18 was wnl at 2.574. - continue home levothyroxine  FEN/GI: carb modified diet Prophylaxis: Eliquis  Disposition: Medically stable but not appropriate to return home with HHPT/OT. Pending PT/OT/SLP recs and placement for SNF. CSW consulted.  Subjective:  Patient reports no complaints today.  States abdominal pain resolved after bowel movement and spontaneous void yesterday.  Says he had oatmeal this morning.  Objective: Temp:  [97.5 F (36.4 C)-98.5 F (36.9 C)] 98.2 F (36.8 C) (04/20 0741) Pulse Rate:  [  61-77] 64 (04/20 0741) Resp:  [16-20] 16 (04/20 0741) BP: (125-177)/(54-81) 162/54 (04/20 0741) SpO2:  [92 %-99 %] 93 % (04/20 0741) Physical Exam: General: A&O to self and place, not  time, NAD with non-toxic appearance HEENT: normocephalic, atraumatic, moist mucous membranes, PERRLA, EOMI Cardiovascular: regular rate and rhythm without murmurs, rubs, or gallops Lungs: clear to auscultation bilaterally with normal work of breathing on room air Abdomen: soft, non-tender, non-distended, normoactive bowel sounds Skin: warm, dry, no rashes or lesions, cap refill < 2 seconds Extremities: warm and well perfused, normal tone, no edema Neuro: CNII-XII intact, unable to assess fine motor given patient's inability to follow directions  Laboratory: Recent Labs  Lab 07/12/18 0346 07/14/18 0301  07/14/18 0542 07/14/18 0610 07/15/18 0538  WBC 6.5 6.6  --   --   --  6.4  HGB 12.7* 13.2   < > 12.6* 12.6* 13.0  HCT 39.5 42.8   < > 37.0* 37.0* 41.1  PLT 153 168  --   --   --  166   < > = values in this interval not displayed.   Recent Labs  Lab 07/10/18 1344  07/12/18 0346 07/14/18 0301  07/14/18 0542 07/14/18 0610 07/15/18 0538  NA 142  --  138 140   < > 139 139 137  K 4.1  --  4.2 4.2   < > 4.1 4.1 4.4  CL 106  --  107 106  --   --   --  102  CO2 24  --  22 20*  --   --   --  28  BUN 15  --  16 16  --   --   --  18  CREATININE 1.06   < > 1.12 1.26*  --   --   --  1.31*  CALCIUM 9.6  --  9.1 9.1  --   --   --  9.5  PROT 6.4*  --   --  6.1*  --   --   --   --   BILITOT 0.9  --   --  0.6  --   --   --   --   ALKPHOS 48  --   --  41  --   --   --   --   ALT 14  --   --  13  --   --   --   --   AST 24  --   --  29  --   --   --   --   GLUCOSE 123*  --  155* 151*  --   --   --  176*   < > = values in this interval not displayed.   Troponin I: Negative x1 UA: Negative UDS: Negative   Imaging/Diagnostic Tests: EKG 12-LEAD (07/14/2018) Sinus rhythm 90 BPM, QTc 394, RBBB, old anterior infarct, paired ventricular premature complexes, no ST changes or t-wave abnormailities  CT HEAD WITHOUT CONTRAST (07/14/2018) IMPRESSION: 1. No new acute intracranial abnormality  identified. 2. Small lucency within left hemi pons corresponding to recent infarction on prior MRI is stable in distribution. 3. Stable chronic infarcts within left superior cerebellar hemisphere and left MCA distribution. 4. Stable chronic microvascular ischemic changes and volume loss of the brain.  PORTABLE CHEST 1 VIEW (07/14/2018) IMPRESSION: Stable findings of COPD.  No acute pulmonary process identified.  Wendee BeaversMcMullen, David J, DO 07/15/2018, 7:56 AM PGY-3, [facility] FPTS Intern pager: [phone], text  pages welcome

## 2018-07-15 NOTE — Progress Notes (Signed)
CSW acknowledging consult for SNF placement. Patient currently does not qualify, recommendation is home health. Please reconsult CSW if situation changes.  For now, CSW signing off.  Blenda Nicely, [place] Clinical Social Worker [phone]

## 2018-07-15 NOTE — Progress Notes (Signed)
SLP Cancellation Note  Patient Details Name: [name] MRN: [id number] DOB: March 07, 1928   Cancelled treatment:       Reason Eval/Treat Not Completed: SLP screened, no needs identified, will sign off. Pt is an 85 y.o. male presenting with altered mental status. PMH is significant for prior episode of AMS, COPD, T2DM, atrial fibrillation on Eliquis, HTN, CAD, h/o recent and remote CVA's. CT of the head was negative for acute changes. He was evaluated by this SLP on 07/11/18 and it was determined that he was at his baseline at that time with chronic expressive aphasia. Screening was conducted and pt's presentation appears to be the same compared to that which was noted on 07/11/18. Skilled SLP services are therefore not clinically indicated at this time.   Amisha Pospisil I. Vear Clock, MS, CCC-SLP Acute Rehabilitation Services Office number [phone] Pager [phone]  Scheryl Marten 07/15/2018, 3:19 PM

## 2018-07-15 NOTE — Progress Notes (Signed)
Called [name], [name], to discuss hospital admission.  Appears to be doing well this morning.  I discussed with [name] my concern about returning home with home health given rapid bounce back after discharge.  [name] also voiced concern that he may not be able to provide the care needed in the acute setting.  I did inform [name] the possibility of SNF not being covered, he did feel this to be best for his father and would like to consider this option with social work and may be willing to pay out of pocket if necessary.  Will place new consult for follow-up.  Durward Parcel, DO [facility], PGY-3

## 2018-07-15 NOTE — NC FL2 (Signed)
[place] MEDICAID FL2 LEVEL OF CARE SCREENING TOOL     IDENTIFICATION  Patient Name: [name] Birthdate: 1928/01/29 Sex: male Admission Date (Current Location): 07/14/2018  [facility] and [place] Number:  Producer, television/film/video and Address:  [facility], [address], [place], [place] [address]      Provider Number: [id number]  Attending Physician Name and Address:  Latrelle Dodrill, MD  Relative Name and Phone Number:       Current Level of Care: Hospital Recommended Level of Care: Skilled Nursing Facility Prior Approval Number:    Date Approved/Denied:   PASRR Number: [id number]  Discharge Plan: SNF    Current Diagnoses: Patient Active Problem List   Diagnosis Date Noted  . AMS (altered mental status) 07/14/2018  . Altered mental status   . Stroke (HCC) 07/10/2018  . Requires supplemental oxygen 06/05/2018  . Chest pain 03/29/2018  . Atrial fibrillation (HCC) 05/20/2015  . RBBB 05/20/2015  . Prolonged Q-T interval on ECG 05/20/2015  . CAD (coronary artery disease) 05/20/2015  . History of CVA (cerebrovascular accident) 05/20/2015  . Atrial fibrillation with RVR (HCC)   . Bronchiectasis with acute exacerbation (HCC)   . Unstable angina (HCC) 03/27/2013  . COPD exacerbation (HCC) 03/25/2013  . Diabetes mellitus type II, uncontrolled (HCC) 03/25/2013  . Hypothyroidism 03/25/2013  . BPH (benign prostatic hyperplasia) 03/25/2013  . HTN (hypertension) 03/25/2013    Orientation RESPIRATION BLADDER Height & Weight     Self, Place  Normal Continent Weight: 160 lb (72.6 kg) Height:  5\' 8"  (172.7 cm)  BEHAVIORAL SYMPTOMS/MOOD NEUROLOGICAL BOWEL NUTRITION STATUS      Continent Diet(see DC summary)  AMBULATORY STATUS COMMUNICATION OF NEEDS Skin   Limited Assist Verbally Normal                       Personal Care Assistance Level of Assistance  Bathing, Feeding, Dressing Bathing Assistance: Limited assistance Feeding  assistance: Independent Dressing Assistance: Limited assistance     Functional Limitations Info  Sight, Hearing, Speech Sight Info: Adequate Hearing Info: Adequate Speech Info: Adequate    SPECIAL CARE FACTORS FREQUENCY  PT (By licensed PT), OT (By licensed OT)     PT Frequency: 5x/wk OT Frequency: 5x/wk            Contractures Contractures Info: Not present    Additional Factors Info  Code Status, Allergies, Insulin Sliding Scale Code Status Info: DNR Allergies Info: NKA   Insulin Sliding Scale Info: 0-9 units 3x/day with meals       Current Medications (07/15/2018):  This is the current hospital active medication list Current Facility-Administered Medications  Medication Dose Route Frequency Provider Last Rate Last Dose  . acetaminophen (TYLENOL) tablet 1,000 mg  1,000 mg Oral Q6H PRN Garnette Gunner, MD   1,000 mg at 07/14/18 2131  . albuterol (PROVENTIL) (2.5 MG/3ML) 0.083% nebulizer solution 3 mL  3 mL Inhalation Q4H PRN Lezlie Octave C, MD      . amLODipine (NORVASC) tablet 5 mg  5 mg Oral Daily Lennox Solders, MD   5 mg at 07/15/18 0859  . apixaban (ELIQUIS) tablet 5 mg  5 mg Oral BID Lennox Solders, MD   5 mg at 07/15/18 0858  . atorvastatin (LIPITOR) tablet 80 mg  80 mg Oral q1800 Lennox Solders, MD   80 mg at 07/14/18 1726  . brimonidine (ALPHAGAN) 0.15 % ophthalmic solution 1 drop  1  drop Both Eyes BID Lennox SoldersWinfrey, Amanda C, MD   1 drop at 07/15/18 0857  . finasteride (PROSCAR) tablet 5 mg  5 mg Oral Daily Lennox SoldersWinfrey, Amanda C, MD   5 mg at 07/15/18 0859  . hydrocerin (EUCERIN) cream 1 application  1 application Topical BID Lennox SoldersWinfrey, Amanda C, MD   1 application at 07/14/18 2132  . insulin aspart (novoLOG) injection 0-9 Units  0-9 Units Subcutaneous TID WC Lennox SoldersWinfrey, Amanda C, MD   2 Units at 07/15/18 1245  . ipratropium-albuterol (DUONEB) 0.5-2.5 (3) MG/3ML nebulizer solution 3 mL  3 mL Inhalation Q6H PRN Lennox SoldersWinfrey, Amanda C, MD      . levothyroxine  (SYNTHROID) tablet 50 mcg  50 mcg Oral Q0600 Lennox SoldersWinfrey, Amanda C, MD      . metoprolol tartrate (LOPRESSOR) tablet 25 mg  25 mg Oral BID Lennox SoldersWinfrey, Amanda C, MD   25 mg at 07/15/18 0859  . mometasone-formoterol (DULERA) 100-5 MCG/ACT inhaler 2 puff  2 puff Inhalation BID Lennox SoldersWinfrey, Amanda C, MD   2 puff at 07/15/18 0730  . multivitamin with minerals tablet 1 tablet  1 tablet Oral Daily Lennox SoldersWinfrey, Amanda C, MD   1 tablet at 07/15/18 0858  . nitroGLYCERIN (NITROSTAT) SL tablet 0.4 mg  0.4 mg Sublingual Q5 min PRN Winfrey, Harlen LabsAmanda C, MD      . olopatadine (PATANOL) 0.1 % ophthalmic solution 1 drop  1 drop Both Eyes BID Lennox SoldersWinfrey, Amanda C, MD   1 drop at 07/15/18 0859  . pantoprazole (PROTONIX) EC tablet 40 mg  40 mg Oral Daily Lennox SoldersWinfrey, Amanda C, MD   40 mg at 07/15/18 0858  . polyvinyl alcohol (LIQUIFILM TEARS) 1.4 % ophthalmic solution 2 drop  2 drop Both Eyes QID PRN Lennox SoldersWinfrey, Amanda C, MD      . senna-docusate (Senokot-S) tablet 2 tablet  2 tablet Oral QHS Lennox SoldersWinfrey, Amanda C, MD   2 tablet at 07/14/18 2132  . simethicone (MYLICON) chewable tablet 160 mg  160 mg Oral QID PRN Garnette Gunnerhompson, Aaron B, MD      . tamsulosin [facility](FLOMAX) capsule 0.4 mg  0.4 mg Oral Daily Lennox SoldersWinfrey, Amanda C, MD   0.4 mg at 07/15/18 [id number]     Discharge Medications: Please see discharge summary for a list of discharge medications.  Relevant Imaging Results:  Relevant Lab Results:   Additional Information SS#: [id number]  Baldemar LenisElizabeth M Doris Mcgilvery, LCSW

## 2018-07-15 NOTE — Progress Notes (Signed)
Physical Therapy Treatment Patient Details Name: [name] MRN: [id number] DOB: Dec 15, 1927 Today's Date: 07/15/2018    History of Present Illness [name] is a 85 y.o. male presenting with altered mental status. PMH is significant for prior episode of AMS, COPD, T2DM, atrial fibrillation on Eliquis, HTN, CAD, h/o recent and remote CVA's. Pt with AMS, increased unsteadiness, and urinary incontinence.     PT Comments    [name] required verbal and tactile cuing for safety with mobility today. He veered to the right while ambualting and required tactile cuing to stay straight. He was able to perform balance exercises with min a. Therapy also added LE strengthening exercises. At this time the patient may benefit from rehab at a SNF v home health. He  Had been home for a short time and did states not feel as though he was safe enough with his ambulation and ADL's. Acute therapy will continue to work with the patient on strengthening and balance.   Follow Up Recommendations  SNF v. Home health PT     Equipment Recommendations  Rolling walker with 5" wheels    Recommendations for Other Services       Precautions / Restrictions Precautions Precautions: Fall Precaution Comments: pt reports that he fell at home because he couldn't walk and needed help to get up Restrictions Weight Bearing Restrictions: No    Mobility  Bed Mobility Overal bed mobility: Needs Assistance Bed Mobility: Supine to Sit     Supine to sit: Supervision     General bed mobility comments: supervisdion for safety to the edge of the bed. Cyuing to slide forward and put his feet on the floor.   Transfers Overall transfer level: Needs assistance Equipment used: Rolling walker (2 wheeled) Transfers: Sit to/from Stand Sit to Stand: Supervision         General transfer comment: supervision for safety and inital balance   Ambulation/Gait Ambulation/Gait assistance: Min guard Gait Distance (Feet):  250 Feet Assistive device: Rolling walker (2 wheeled) Gait Pattern/deviations: Step-through pattern;Decreased stride length Gait velocity: decreased Gait velocity interpretation: <1.31 ft/sec, indicative of household ambulator General Gait Details: Frequnt verbal and tactile cuing to stay in the walker and for navigation in the hallway   Stairs             Wheelchair Mobility    Modified Rankin (Stroke Patients Only) Modified Rankin (Stroke Patients Only) Pre-Morbid Rankin Score: Moderate disability Modified Rankin: Moderately severe disability     Balance Overall balance assessment: Needs assistance Sitting-balance support: Feet supported;No upper extremity supported Sitting balance-Leahy Scale: Good     Standing balance support: Bilateral upper extremity supported;During functional activity Standing balance-Leahy Scale: Poor Standing balance comment: Requires UE support to maintain standing balance.                High Level Balance Comments: worked on narrow base of support eyes open 2x30 sec eyes closed 2x30 with min a for balance ;tandem 2x30 sec hold each leg forward min a for balance 2x30 sec each             Cognition Arousal/Alertness: Awake/alert Behavior During Therapy: WFL for tasks assessed/performed Overall Cognitive Status: History of cognitive impairments - at baseline Area of Impairment: Safety/judgement                         Safety/Judgement: Decreased awareness of safety;Decreased awareness of deficits     General Comments: required freqent verbal and tacilie  cuing for safety in the walker.       Exercises General Exercises - Lower Extremity Long Arc Quad: 10 reps;Both(2x10 ) Hip ABduction/ADduction: 10 reps(mod cuing for exercises 2x10 ) Hip Flexion/Marching: 20 reps;Both    General Comments        Pertinent Vitals/Pain Pain Assessment: No/denies pain    Home Living                      Prior Function             PT Goals (current goals can now be found in the care plan section) Acute Rehab PT Goals Patient Stated Goal: Pt did not state goals during session PT Goal Formulation: With patient Time For Goal Achievement: 07/28/18 Potential to Achieve Goals: Good    Frequency    Min 3X/week      PT Plan Discharge plan needs to be updated    Co-evaluation              AM-PAC PT "6 Clicks" Mobility   Outcome Measure  Help needed turning from your back to your side while in a flat bed without using bedrails?: A Little Help needed moving from lying on your back to sitting on the side of a flat bed without using bedrails?: A Little Help needed moving to and from a bed to a chair (including a wheelchair)?: A Little Help needed standing up from a chair using your arms (e.g., wheelchair or bedside chair)?: A Little Help needed to walk in hospital room?: A Little Help needed climbing 3-5 steps with a railing? : A Lot 6 Click Score: 17    End of Session Equipment Utilized During Treatment: Gait belt Activity Tolerance: Patient tolerated treatment well Patient left: in chair;with chair alarm set;with call bell/phone within reach Nurse Communication: Mobility status PT Visit Diagnosis: Unsteadiness on feet (R26.81);Difficulty in walking, not elsewhere classified (R26.2)     Time: [id number] PT Time Calculation (min) (ACUTE ONLY): 23 min  Charges:  $Gait Training: 8-22 mins $Therapeutic Exercise: 8-22 mins                       Dessie Comaavid J Dellar Traber PT DPT  07/15/2018, 10:28 AM

## 2018-07-15 NOTE — Progress Notes (Signed)
CSW acknowledging consult for SNF placement. CSW completed referral and faxed out per chart review that the son is agreeable. CSW placed a phone call to patient's son [name] to complete assessment, left voicemail. Awaiting call back.  CSW to follow.  Blenda Nicely, [place] Clinical Social Worker [phone]

## 2018-07-15 NOTE — Evaluation (Signed)
Occupational Therapy Evaluation Patient Details Name: [name] MRN: [id number] DOB: [birth year] Today's Date: 07/15/2018    History of Present Illness [name] is a 85 y.o. male presenting with altered mental status. PMH is significant for prior episode of AMS, COPD, T2DM, atrial fibrillation on Eliquis, HTN, CAD, h/o recent and remote CVA's. Pt with AMS, increased unsteadiness, and urinary incontinence.    Clinical Impression   PTA patient reports independent with ADLs/mobility, admitted for above and noted recent admission 4/16 (with dc home). Patient presents with impaired cognition (disoriented to time, impaired sequencing, problem solving, and processing), decreased safety awareness, impaired balance, and commuincation.  Patient able to follow 1 step commands given increased time, but noted poor attention, unable to follow multiple step commands, and requires multimodal cueing for processing tasks.  Patient requires min assist for LB ADLs, min to setup assist for UB ADLs, min guard for grooming standing at sink, and min guard for transfers and in room mobility.  Patient will benefit from continued OT services while admitted and recommend continued HHOT services at discharge.  If patient does not have 24/7 assistance at dc, will need SNF rehab. Will follow.     Follow Up Recommendations  Supervision/Assistance - 24 hour;Home health OT(if pt does not have 24/7 assist will need SNF )    Equipment Recommendations  3 in 1 bedside commode    Recommendations for Other Services       Precautions / Restrictions Precautions Precautions: Fall Precaution Comments: pt reports that he fell at home because he couldn't walk and needed help to get up Restrictions Weight Bearing Restrictions: No      Mobility Bed Mobility Overal bed mobility: Needs Assistance Bed Mobility: Supine to Sit     Supine to sit: Supervision     General bed mobility comments: OOB in chair upon entry    Transfers Overall transfer level: Needs assistance Equipment used: Rolling walker (2 wheeled) Transfers: Sit to/from Stand Sit to Stand: Min guard         General transfer comment: min guard for safety and balance     Balance Overall balance assessment: Needs assistance Sitting-balance support: Feet supported;No upper extremity supported Sitting balance-Leahy Scale: Good     Standing balance support: Bilateral upper extremity supported;During functional activity Standing balance-Leahy Scale: Poor Standing balance comment: reliant on B UE support, able to complete grooming at sink with UE support but min guard               High Level Balance Comments: worked on narrow base of support eyes open 2x30 sec eyes closed 2x30 with min a for balance ;tandem 2x30 sec hold each leg forward min a for balance 2x30 sec each            ADL either performed or assessed with clinical judgement   ADL Overall ADL's : Needs assistance/impaired     Grooming: Min guard;Standing Grooming Details (indicate cue type and reason): cues to locate soap and use while washing hands, cueing to locate needs for brushing teeth  Upper Body Bathing: Sitting;Set up   Lower Body Bathing: Sit to/from stand;Minimal assistance   Upper Body Dressing : Set up;Sitting   Lower Body Dressing: Minimal assistance;Sit to/from stand Lower Body Dressing Details (indicate cue type and reason): min guard sit to stand, min assist to adjust socks  Toilet Transfer: Ambulation;Min guard Toilet Transfer Details (indicate cue type and reason): simulated to recliner, using RW  Functional mobility during ADLs: Min guard;Rolling walker General ADL Comments: pt limited by cognition, balance and safety      Vision   Additional Comments: further assesment required, cueing to locate items on sink      Perception     Praxis      Pertinent Vitals/Pain Pain Assessment: No/denies pain     Hand  Dominance     Extremity/Trunk Assessment Upper Extremity Assessment Upper Extremity Assessment: Generalized weakness   Lower Extremity Assessment Lower Extremity Assessment: Defer to PT evaluation       Communication Communication Communication: HOH;Expressive difficulties   Cognition Arousal/Alertness: Awake/alert Behavior During Therapy: Flat affect Overall Cognitive Status: History of cognitive impairments - at baseline Area of Impairment: Orientation;Attention;Memory;Following commands;Safety/judgement;Awareness;Problem solving                 Orientation Level: Disoriented to;Time Current Attention Level: Sustained Memory: Decreased short-term memory Following Commands: Follows one step commands consistently;Follows one step commands with increased time;Follows multi-step commands inconsistently Safety/Judgement: Decreased awareness of safety;Decreased awareness of deficits Awareness: Intellectual Problem Solving: Decreased initiation;Difficulty sequencing;Slow processing;Requires verbal cues;Requires tactile cues General Comments: patient disoriented to time, verbalizes "I dont know" throughout session; able to follow 1 step commands consistently but unable to follow multiple step commands    General Comments       Exercises Exercises: General Lower Extremity General Exercises - Lower Extremity Long Arc Quad: 10 reps;Both(2x10 ) Hip ABduction/ADduction: 10 reps(mod cuing for exercises 2x10 ) Hip Flexion/Marching: 20 reps;Both   Shoulder Instructions      Home Living Family/patient expects to be discharged to:: Private residence Living Arrangements: Children Available Help at Discharge: Available 24 hours/day Type of Home: House Home Access: Level entry     Home Layout: One level     Bathroom Shower/Tub: Chief Strategy Officer: Standard     Home Equipment: Environmental consultant - 2 wheels;Bedside commode;Shower seat          Prior  Functioning/Environment Level of Independence: Independent        Comments: independent prior to last admission, pt reports independent with ADLs         OT Problem List: Decreased strength;Decreased range of motion;Decreased activity tolerance;Impaired balance (sitting and/or standing);Decreased coordination;Decreased cognition;Decreased knowledge of use of DME or AE;Decreased safety awareness      OT Treatment/Interventions: Self-care/ADL training;Therapeutic exercise;Neuromuscular education;DME and/or AE instruction;Therapeutic activities;Patient/family education;Balance training;Cognitive remediation/compensation    OT Goals(Current goals can be found in the care plan section) Acute Rehab OT Goals Patient Stated Goal: none stated Time For Goal Achievement: 07/29/18 Potential to Achieve Goals: Good  OT Frequency: Min 2X/week   Barriers to D/C:            Co-evaluation              AM-PAC OT "6 Clicks" Daily Activity     Outcome Measure Help from another person eating meals?: None Help from another person taking care of personal grooming?: A Little Help from another person toileting, which includes using toliet, bedpan, or urinal?: A Little Help from another person bathing (including washing, rinsing, drying)?: A Little Help from another person to put on and taking off regular upper body clothing?: None Help from another person to put on and taking off regular lower body clothing?: A Little 6 Click Score: 20   End of Session Equipment Utilized During Treatment: Rolling walker Nurse Communication: Mobility status  Activity Tolerance: Patient tolerated treatment well Patient left: in chair;with call bell/phone within  reach;with chair alarm set  OT Visit Diagnosis: Unsteadiness on feet (R26.81);Muscle weakness (generalized) (M62.81);Other symptoms and signs involving cognitive function                Time: [id number] OT Time Calculation (min): 18 min Charges:  OT  General Charges $OT Visit: 1 Visit OT Evaluation $OT Eval Moderate Complexity: 1 Mod  Chancy Milroyhristie S Kenitra Leventhal, OT Acute Rehabilitation Services Pager [phone] Office [phone]   Chancy MilroyChristie S Venus Gilles 07/15/2018, 1:03 PM

## 2018-07-15 NOTE — Discharge Instructions (Signed)

## 2018-07-16 DIAGNOSIS — I4891 Unspecified atrial fibrillation: Secondary | ICD-10-CM | POA: Diagnosis present

## 2018-07-16 DIAGNOSIS — E785 Hyperlipidemia, unspecified: Secondary | ICD-10-CM | POA: Diagnosis present

## 2018-07-16 DIAGNOSIS — I2510 Atherosclerotic heart disease of native coronary artery without angina pectoris: Secondary | ICD-10-CM | POA: Diagnosis present

## 2018-07-16 DIAGNOSIS — Z8249 Family history of ischemic heart disease and other diseases of the circulatory system: Secondary | ICD-10-CM | POA: Diagnosis not present

## 2018-07-16 DIAGNOSIS — I4510 Unspecified right bundle-branch block: Secondary | ICD-10-CM | POA: Diagnosis present

## 2018-07-16 DIAGNOSIS — R32 Unspecified urinary incontinence: Secondary | ICD-10-CM | POA: Diagnosis present

## 2018-07-16 DIAGNOSIS — R7989 Other specified abnormal findings of blood chemistry: Secondary | ICD-10-CM | POA: Diagnosis present

## 2018-07-16 DIAGNOSIS — I69351 Hemiplegia and hemiparesis following cerebral infarction affecting right dominant side: Secondary | ICD-10-CM | POA: Diagnosis not present

## 2018-07-16 DIAGNOSIS — Z7901 Long term (current) use of anticoagulants: Secondary | ICD-10-CM | POA: Diagnosis not present

## 2018-07-16 DIAGNOSIS — Z7951 Long term (current) use of inhaled steroids: Secondary | ICD-10-CM | POA: Diagnosis not present

## 2018-07-16 DIAGNOSIS — H409 Unspecified glaucoma: Secondary | ICD-10-CM | POA: Diagnosis present

## 2018-07-16 DIAGNOSIS — Z66 Do not resuscitate: Secondary | ICD-10-CM | POA: Diagnosis present

## 2018-07-16 DIAGNOSIS — H9190 Unspecified hearing loss, unspecified ear: Secondary | ICD-10-CM | POA: Diagnosis present

## 2018-07-16 DIAGNOSIS — J449 Chronic obstructive pulmonary disease, unspecified: Secondary | ICD-10-CM | POA: Diagnosis present

## 2018-07-16 DIAGNOSIS — N400 Enlarged prostate without lower urinary tract symptoms: Secondary | ICD-10-CM | POA: Diagnosis present

## 2018-07-16 DIAGNOSIS — Z79899 Other long term (current) drug therapy: Secondary | ICD-10-CM | POA: Diagnosis not present

## 2018-07-16 DIAGNOSIS — E119 Type 2 diabetes mellitus without complications: Secondary | ICD-10-CM | POA: Diagnosis present

## 2018-07-16 DIAGNOSIS — Z87891 Personal history of nicotine dependence: Secondary | ICD-10-CM | POA: Diagnosis not present

## 2018-07-16 DIAGNOSIS — I10 Essential (primary) hypertension: Secondary | ICD-10-CM | POA: Diagnosis present

## 2018-07-16 DIAGNOSIS — F0390 Unspecified dementia without behavioral disturbance: Secondary | ICD-10-CM | POA: Diagnosis present

## 2018-07-16 DIAGNOSIS — R4182 Altered mental status, unspecified: Secondary | ICD-10-CM | POA: Diagnosis present

## 2018-07-16 DIAGNOSIS — R262 Difficulty in walking, not elsewhere classified: Secondary | ICD-10-CM | POA: Diagnosis present

## 2018-07-16 DIAGNOSIS — E039 Hypothyroidism, unspecified: Secondary | ICD-10-CM | POA: Diagnosis present

## 2018-07-16 DIAGNOSIS — Z79890 Hormone replacement therapy (postmenopausal): Secondary | ICD-10-CM | POA: Diagnosis not present

## 2018-07-16 LAB — GLUCOSE, CAPILLARY
Glucose-Capillary: 136 mg/dL — ABNORMAL HIGH (ref 70–99)
Glucose-Capillary: 151 mg/dL — ABNORMAL HIGH (ref 70–99)
Glucose-Capillary: 125 mg/dL — ABNORMAL HIGH (ref 70–99)

## 2018-07-16 LAB — BASIC METABOLIC PANEL
Anion gap: 10 (ref 5–15)
BUN: 22 mg/dL (ref 8–23)
CO2: 25 mmol/L (ref 22–32)
Calcium: 9.7 mg/dL (ref 8.9–10.3)
Chloride: 104 mmol/L (ref 98–111)
Creatinine, Ser: 1.22 mg/dL (ref 0.61–1.24)
GFR calc Af Amer: >60 mL/min (ref >60)
GFR calc non Af Amer: 53 mL/min — ABNORMAL LOW (ref >60)
Glucose, Bld: 165 mg/dL — ABNORMAL HIGH (ref 70–99)
Potassium: 4.1 mmol/L (ref 3.5–5.1)
Sodium: 139 mmol/L (ref 135–145)

## 2018-07-16 MED ORDER — TAMSULOSIN HCL 0.4 MG PO CAPS: 0.4000 mg | ORAL_CAPSULE | Freq: Every day | ORAL | 0 refills | Status: DC

## 2018-07-16 NOTE — Progress Notes (Signed)
Family Medicine Teaching Service Daily Progress Note Intern Pager: [phone]  Patient name: [name] Medical record number: [id number] Date of birth: Apr 30, 1927 Age: 85 y.o. Gender: male  Primary Care Provider: Bing Neighbors, FNP Consultants: PT/OT/SLP Code Status: DNR  Pt Overview and Major Events to Date:  4/15: Admit for R-sided weakess d/t L-pontine infarct 4/17: PT/OT rec HHPT/OT, stable for d/c 4/19: Bounce back for persistent AMS, ataxia, urinary incontinence, CT w/o new changes 4/20: CSW re-consulted for consideration of SNF, pt son [name] in agreement  Assessment and Plan: [name] is a 85 y.o. male who presented with altered mental status and was found to have acute L pons infarct. PMH is significant for prior episode of AMS, COPD, T2DM, atrial fibrillation on Eliquis, HTN, CAD, h/o recent and remote CVA's.  Acute left pons infarct, improved  chronic expressive aphasia and dementia. Stable  - Medically ready for discharge, awaiting SNF placement pending family financial situation  - OT now recommending SNF - PT reconsulted as well   Elevated creatinine, improved: Cr peak 1.31> now 1.22 (baseline 1.0).  Has h/o urinary retention but voiding spontaneously at this time. PVR on 4/19 was 40cc - Avoid nephrotoxic agents - Bladder scans if concerns of urinary retention - Strict I's and O's  Glaucoma: has chronic irritation and is awaiting surgery at [facility] after pandemic is over -Continue home olopatadine, brimonidine, and artificial tears  Afib: Rate controlled. - continue eliquis  Diabetes: a1c 7.9 on 07/10/18 - sSSI - hold metformin due to elevated creatinine - monitor CBGs with meals  Hyperlipidemia:LDL 150 - continue atorvastatin 80 mg daily  HTN: Stable.  - continue home amlodipine - holding HCTZ and lisinopril in setting of elevated creatinine  COPD:Stable. Uses symbicort at home.   - dulera while admitted - prn  albuterol  Hypothyroidism:  TSH on 07/11/18 was wnl at 2.574. - continue home levothyroxine  FEN/GI: carb modified diet Prophylaxis: Eliquis  Disposition: Medically stable but not appropriate to return home with HHPT/OT. SW reconsulted for SNF placement  Subjective:  Feels well this morning. States that he talked with his son about possibly going home. No complaints.  Objective: Temp:  [97.7 F (36.5 C)-98.5 F (36.9 C)] 98.4 F (36.9 C) (04/21 0327) Pulse Rate:  [57-70] 63 (04/21 0327) Resp:  [16-18] 18 (04/21 0327) BP: (114-162)/(47-65) 114/56 (04/21 0327) SpO2:  [91 %-97 %] 91 % (04/21 0327) Physical Exam:  General: sitting up in chair, in NAD Cardiovascular: RRR, no murmurs Lungs: CTAB, NWOB Abdomen: soft, nontender, nondistended, + bowel sounds Extremities: WWP, no LE edema Neuro: alert and oriented to self. Strength 5/5 bilaterally. Walks without assistive device, only slightly unsteady gait while going up and down steps  Laboratory: Recent Labs  Lab 07/12/18 0346 07/14/18 0301  07/14/18 0542 07/14/18 0610 07/15/18 0538  WBC 6.5 6.6  --   --   --  6.4  HGB 12.7* 13.2   < > 12.6* 12.6* 13.0  HCT 39.5 42.8   < > 37.0* 37.0* 41.1  PLT 153 168  --   --   --  166   < > = values in this interval not displayed.   Recent Labs  Lab 07/10/18 1344  07/14/18 0301  07/14/18 0610 07/15/18 0538 07/16/18 0403  NA 142   < > 140   < > 139 137 139  K 4.1   < > 4.2   < > 4.1 4.4 4.1  CL 106   < >  106  --   --  102 104  CO2 24   < > 20*  --   --  28 25  BUN 15   < > 16  --   --  18 22  CREATININE 1.06   < > 1.26*  --   --  1.31* 1.22  CALCIUM 9.6   < > 9.1  --   --  9.5 9.7  PROT 6.4*  --  6.1*  --   --   --   --   BILITOT 0.9  --  0.6  --   --   --   --   ALKPHOS 48  --  41  --   --   --   --   ALT 14  --  13  --   --   --   --   AST 24  --  29  --   --   --   --   GLUCOSE 123*   < > 151*  --   --  176* 165*   < > = values in this interval not displayed.    Troponin I: Negative x1 UA: Negative UDS: Negative   Imaging/Diagnostic Tests: EKG 12-LEAD (07/14/2018) Sinus rhythm 90 BPM, QTc 394, RBBB, old anterior infarct, paired ventricular premature complexes, no ST changes or t-wave abnormailities  CT HEAD WITHOUT CONTRAST (07/14/2018) IMPRESSION: 1. No new acute intracranial abnormality identified. 2. Small lucency within left hemi pons corresponding to recent infarction on prior MRI is stable in distribution. 3. Stable chronic infarcts within left superior cerebellar hemisphere and left MCA distribution. 4. Stable chronic microvascular ischemic changes and volume loss of the brain.  PORTABLE CHEST 1 VIEW (07/14/2018) IMPRESSION: Stable findings of COPD.  No acute pulmonary process identified.  Leland HerYoo, Adiba Fargnoli J, DO 07/16/2018, 7:05 AM PGY-3, [facility] FPTS Intern pager: [phone], text pages welcome

## 2018-07-16 NOTE — TOC Initial Note (Signed)
Transition of Care (TOC) - Initial/Assessment Note    Patient Details  Name: [name] MRN: [id number] [facility] Birth: 07/29/1927  Transition of Care [facility](TOC) CM/SW Contact:    Baldemar LenisElizabeth M Jazzmyn Filion, LCSW Phone Number: 07/16/2018, 11:52 AM  Clinical Narrative:  CSW spoke with patient's son, Windy FastRonald, at length to discuss patient's discharge needs. Windy FastRonald indicated that he wasn't entirely sure what to do, because he wants the patient to be able to come home but understands that the patient could benefit from rehab. Windy FastRonald says when he talks to the patient he keeps talking about how he wants to come home, and everybody tells him that the patient is doing really well so he doesn't entirely understand why he can't come home but that he wants what's best for his dad. CSW explained that therapies were recommending 24/7 supervision for the patient, and CSW read out what patient was able to do with the therapist's yesterday. Windy FastRonald said that would be something they could handle at home and family would be available to assist him 24 hours a day. CSW discussed how SNF could be pursued if son didn't believe that they could handle him at home, but with how well the patient was doing, that Medicare most likely wouldn't cover the costs and he would end up getting a bill from the SNF down the road. Windy FastRonald discussed concerns about how the patient has limited income, and they wouldn't have the money to pay for a large bill. CSW confirmed patient was still set up with [facility] for home health services, and requested that patient be set up priority as they had not seen the patient after his last discharge. [facility]W explained to [facility] the concern with patient's readmission and they have patient on the schedule to see tomorrow.                 Expected Discharge Plan: Home w Home Health Services Barriers to Discharge: No Barriers Identified   Patient Goals and CMS Choice   CMS Medicare.gov Compare Post Acute  Care list provided to:: Patient Represenative (must comment) Choice offered to / list presented to : Adult Children  Expected Discharge Plan and Services Expected Discharge Plan: Home w Home Health Services     Post Acute Care Choice: Home Health, Durable Medical Equipment Living arrangements for the past 2 months: Mobile Home                 DME Arranged: Walker rolling DME Agency: [facility] HH Arranged: PT, OT HH Agency: [facility]  Prior Living Arrangements/Services Living arrangements for the past 2 months: Mobile Home Lives with:: Adult Children Patient language and need for interpreter reviewed:: No Do you feel safe going back to the place where you live?: Yes      Need for Family Participation in Patient Care: Yes (Comment) Care giver support system in place?: Yes (comment) Current home services: DME Criminal Activity/Legal Involvement Pertinent to Current Situation/Hospitalization: No - Comment as needed  Activities of Daily Living      Permission Sought/Granted Permission sought to share information with : Family Supports Permission granted to share information with : Yes, Verbal Permission Granted  Share Information with NAME: Windy FastRonald     Permission granted to share info w Relationship: Son     Emotional Assessment Appearance:: Appears stated age Attitude/Demeanor/Rapport: Unable to Assess Affect (typically observed): Unable to Assess Orientation: : Oriented to Self Alcohol / Substance Use: Not Applicable Psych Involvement: No (  comment)  Admission diagnosis:  Altered mental status, unspecified altered mental status type [R41.82] Patient Active Problem List   Diagnosis Date Noted  . AMS (altered mental status) 07/14/2018  . Altered mental status   . Stroke (HCC) 07/10/2018  . Requires supplemental oxygen 06/05/2018  . Chest pain 03/29/2018  . Atrial fibrillation (HCC) 05/20/2015  . RBBB 05/20/2015  . Prolonged Q-T interval on ECG  05/20/2015  . CAD (coronary artery disease) 05/20/2015  . History of CVA (cerebrovascular accident) 05/20/2015  . Atrial fibrillation with RVR (HCC)   . Bronchiectasis with acute exacerbation (HCC)   . Unstable angina (HCC) 03/27/2013  . COPD exacerbation (HCC) 03/25/2013  . Diabetes mellitus type II, uncontrolled (HCC) 03/25/2013  . Hypothyroidism 03/25/2013  . BPH (benign prostatic hyperplasia) 03/25/2013  . HTN (hypertension) 03/25/2013   PCP:  Bing Neighbors, FNP Pharmacy:   [facility] [phone] [place], [place] - [phone] AT [facility] [address] [facility] [address] [place] [address] Phone: [phone] Fax: [phone]  [facility] - [place], [place] - [address] [place] [address] Phone: [phone] Fax: [phone]     Social Determinants of Health (SDOH) Interventions    Readmission Risk Interventions No flowsheet data found.

## 2018-07-16 NOTE — Progress Notes (Signed)
Occupational Therapy Treatment Patient Details Name: [name] Zellars MRN: [id number] DOB: 03/08/1928 Today's Date: 07/16/2018    History of present illness [name] Si is a 85 y.o. male presenting with altered mental status. PMH is significant for prior episode of AMS, COPD, T2DM, atrial fibrillation on Eliquis, HTN, CAD, h/o recent and remote CVA's. Pt with AMS, increased unsteadiness, and urinary incontinence.    OT comments  Patient progressing slowly.  Continues to be limited by cognition and balance.  Able to complete functional mobility and transfers in room with min guard, cueing and assist for walker mgmt and safety.  Requires multimodal cueing to complete basic self care tasks for problem solving, awareness and sequencing, assist to locate soap while washing hands initially but able to recall when completing task for the second time. Recommend SNF rehab at dc to decrease burden of care at home with ADls, mobility and safety.    Follow Up Recommendations  SNF;Supervision/Assistance - 24 hour    Equipment Recommendations  3 in 1 bedside commode    Recommendations for Other Services      Precautions / Restrictions Precautions Precautions: Fall Precaution Comments: pt reports fall(s) at home Restrictions Weight Bearing Restrictions: No       Mobility Bed Mobility Overal bed mobility: Needs Assistance Bed Mobility: Supine to Sit     Supine to sit: Supervision     General bed mobility comments: supervision for safety   Transfers Overall transfer level: Needs assistance Equipment used: Rolling walker (2 wheeled) Transfers: Sit to/from Stand Sit to Stand: Min guard         General transfer comment: min guard for stand to sit and cues to get closer to surface before sitting    Balance Overall balance assessment: Needs assistance Sitting-balance support: Feet supported;No upper extremity supported Sitting balance-Leahy Scale: Good     Standing balance  support: During functional activity;No upper extremity supported Standing balance-Leahy Scale: Poor Standing balance comment: relaint on UE support dynamically                 Standardized Balance Assessment Standardized Balance Assessment : Dynamic Gait Index   Dynamic Gait Index Level Surface: Mild Impairment Change in Gait Speed: Mild Impairment Gait and Pivot Turn: Mild Impairment Step Over Obstacle: Mild Impairment Steps: Mild Impairment     ADL either performed or assessed with clinical judgement   ADL Overall ADL's : Needs assistance/impaired     Grooming: Supervision/safety;Standing;Wash/dry hands Grooming Details (indicate cue type and reason): with cueing to utilize soap initally, able to recall while washing hands 2nd time                   Toilet Transfer: Ambulation;Min guard;RW StatisticianToilet Transfer Details (indicate cue type and reason): min guard for walker mgmt and safeyt  Toileting- Clothing Manipulation and Hygiene: Min guard;Sit to/from stand       Functional mobility during ADLs: Min guard;Rolling walker General ADL Comments: pt limited by cognition, balance and safety      Vision       Perception     Praxis      Cognition Arousal/Alertness: Awake/alert Behavior During Therapy: WFL for tasks assessed/performed Overall Cognitive Status: History of cognitive impairments - at baseline Area of Impairment: Safety/judgement;Following commands;Memory;Problem solving;Awareness                     Memory: Decreased short-term memory Following Commands: Follows one step commands inconsistently;Follows one step commands with increased time  Safety/Judgement: Decreased awareness of safety;Decreased awareness of deficits Awareness: Intellectual Problem Solving: Decreased initiation;Difficulty sequencing;Slow processing;Requires verbal cues;Requires tactile cues General Comments: pt continues to require multimodal cueing to follow 1 step  commands, improved sequencing of self care tasks with minimal cueing         Exercises     Shoulder Instructions       General Comments Attempted DGI however pt unable to follow instructions for some tasks    Pertinent Vitals/ Pain       Pain Assessment: No/denies pain  Home Living                                          Prior Functioning/Environment              Frequency  Min 2X/week        Progress Toward Goals  OT Goals(current goals can now be found in the care plan section)  Progress towards OT goals: Progressing toward goals  Acute Rehab OT Goals Patient Stated Goal: none stated  Plan Discharge plan needs to be updated;Frequency remains appropriate    Co-evaluation                 AM-PAC OT "6 Clicks" Daily Activity     Outcome Measure   Help from another person eating meals?: None Help from another person taking care of personal grooming?: A Little Help from another person toileting, which includes using toliet, bedpan, or urinal?: A Little Help from another person bathing (including washing, rinsing, drying)?: A Little Help from another person to put on and taking off regular upper body clothing?: None Help from another person to put on and taking off regular lower body clothing?: A Little 6 Click Score: 20    End of Session Equipment Utilized During Treatment: Rolling walker  OT Visit Diagnosis: Unsteadiness on feet (R26.81);Muscle weakness (generalized) (M62.81);Other symptoms and signs involving cognitive function   Activity Tolerance Patient tolerated treatment well   Patient Left Other (comment)(up in room with PT )   Nurse Communication Mobility status        Time: 1224-4975 OT Time Calculation (min): 22 min  Charges: OT General Charges $OT Visit: 1 Visit OT Treatments $Self Care/Home Management : 8-22 mins  Chancy Milroy, OT Acute Rehabilitation Services Pager [phone] Office  [phone]    Chancy Milroy 07/16/2018, 10:06 AM

## 2018-07-16 NOTE — Discharge Summary (Signed)
Family Medicine Teaching [facility] Discharge Summary  Patient name: [name] Medical record number: [id number] Date of birth: August 30, 1927 Age: 85 y.o. Gender: male Date of Admission: 07/14/2018  Date of Discharge: 07/16/18 Admitting Physician: Moses Manners, MD  Primary Care Provider: Bing Neighbors, FNP Consultants: none  Indication for Hospitalization: AMS  Discharge Diagnoses/Problem List:  AMS Glaucoma Afib Diabetes HLD HTN COPD Hypothyroidism  Disposition: home with [facility]  Discharge Condition: stable  Discharge Exam:  General: sitting up in chair, in NAD Cardiovascular: RRR, no murmurs Lungs: CTAB, NWOB Abdomen: soft, nontender, nondistended, + bowel sounds Extremities: WWP, no LE edema Neuro: alert and oriented to self. Strength 5/5 bilaterally. Walks without assistive device, only slightly unsteady gait while going up and down steps  Brief Hospital Course:  [name] is a 85 y.o. male who presented with altered mental status after a recent hospital stay for stroke a few days prior to this presentation. PMH is significant for prior episode of AMS, COPD, T2DM, atrial fibrillation on Eliquis, HTN, CAD, h/o recent and remote CVA's. History on admission was difficult due to patient's baseline aphasia but he was brought by his son who had concerns of increased confusion with unsteady gait without falls and questionable urinary incontinence. Based on initial history there was a high suspicion that family was unable to care for patient at home. Patient was reevaluated by PT/OT. Patient and family considered SNF but after discussion decided that since patient has 24h supervision at home they would like to try caring for patient at home with [facility] PT/OT and [facility] aide at this time.   Issues for Follow Up:  1. Please ensure that patient has home health PT, OT and aide services   Significant Procedures: none  Significant Labs and Imaging:  Recent Labs  Lab  07/12/18 0346 07/14/18 0301  07/14/18 0542 07/14/18 0610 07/15/18 0538  WBC 6.5 6.6  --   --   --  6.4  HGB 12.7* 13.2   < > 12.6* 12.6* 13.0  HCT 39.5 42.8   < > 37.0* 37.0* 41.1  PLT 153 168  --   --   --  166   < > = values in this interval not displayed.   Recent Labs  Lab 07/10/18 1344 07/10/18 1356 07/12/18 0346 07/14/18 0301 07/14/18 0524 07/14/18 0542 07/14/18 0610 07/15/18 0538 07/16/18 0403  NA 142  --  138 140 139 139 139 137 139  K 4.1  --  4.2 4.2 3.9 4.1 4.1 4.4 4.1  CL 106  --  107 106  --   --   --  102 104  CO2 24  --  22 20*  --   --   --  28 25  GLUCOSE 123*  --  155* 151*  --   --   --  176* 165*  BUN 15  --  16 16  --   --   --  18 22  CREATININE 1.06 1.00 1.12 1.26*  --   --   --  1.31* 1.22  CALCIUM 9.6  --  9.1 9.1  --   --   --  9.5 9.7  ALKPHOS 48  --   --  41  --   --   --   --   --   AST 24  --   --  29  --   --   --   --   --   ALT 14  --   --  13  --   --   --   --   --   ALBUMIN 3.8  --   --  3.6  --   --   --   --   --   Ct Head Wo Contrast  Result Date: 07/14/2018 CLINICAL DATA:  85 y/o  M; altered mental status. History of CVA. EXAM: CT HEAD WITHOUT CONTRAST TECHNIQUE: Contiguous axial images were obtained from the base of the skull through the vertex without intravenous contrast. COMPARISON:  07/10/2018 CT of head and MRI of head. FINDINGS: Brain: Small lucency within the left hemi pons corresponding to infarction on prior MRI of the head is stable in distribution. Chronic infarcts are present within the left superior cerebellar hemisphere in the left MCA distribution. No new finding of stroke, hemorrhage, extra-axial collection, hydrocephalus, or herniation. Background of mild chronic microvascular ischemic changes and moderate volume loss of the brain. Vascular: Calcific atherosclerosis of the carotid siphons. No hyperdense vessel identified. Skull: Normal. Negative for fracture or focal lesion. Sinuses/Orbits: Mild frontal and ethmoid sinus  mucosal thickening. Normal aeration of mastoid air cells. Bilateral intra-ocular lens replacement. Other: None. IMPRESSION: 1. No new acute intracranial abnormality identified. 2. Small lucency within left hemi pons corresponding to recent infarction on prior MRI is stable in distribution. 3. Stable chronic infarcts within left superior cerebellar hemisphere and left MCA distribution. 4. Stable chronic microvascular ischemic changes and volume loss of the brain. Electronically Signed   By: Mitzi Hansen M.D.   On: 07/14/2018 03:33   Dg Chest Portable 1 View  Result Date: 07/14/2018 CLINICAL DATA:  85 y/o  M; episodes of altered mental status. EXAM: PORTABLE CHEST 1 VIEW COMPARISON:  07/10/2018 chest radiograph. FINDINGS: Normal cardiac silhouette. Aortic atherosclerosis with calcification. Stable chronic interstitial changes, hyperinflated lungs, emphysema. No focal consolidation. No pleural effusion or pneumothorax. No acute osseous abnormality is evident. IMPRESSION: Stable findings of COPD.  No acute pulmonary process identified. Electronically Signed   By: Mitzi Hansen M.D.   On: 07/14/2018 03:30    Results/Tests Pending at Time of Discharge: none  Discharge Medications:  Allergies as of 07/16/2018   No Known Allergies     Medication List    TAKE these medications   albuterol 108 (90 Base) MCG/ACT inhaler Commonly known as:  VENTOLIN HFA Inhale 2 puffs into the lungs every 4 (four) hours as needed for wheezing or shortness of breath.   amLODipine 5 MG tablet Commonly known as:  NORVASC Take 5 mg by mouth daily.   apixaban 5 MG Tabs tablet Commonly known as:  ELIQUIS Take 1 tablet (5 mg total) by mouth 2 (two) times daily.   atorvastatin 80 MG tablet Commonly known as:  LIPITOR Take 1 tablet (80 mg total) by mouth daily at 6 PM.   brimonidine 0.15 % ophthalmic solution Commonly known as:  ALPHAGAN Place 1 drop into both eyes 2 (two) times daily.    budesonide-formoterol 80-4.5 MCG/ACT inhaler Commonly known as:  SYMBICORT Inhale 2 puffs into the lungs 2 (two) times daily.   Carboxymethylcellulose Sodium 0.25 % Soln Place 2 drops into both eyes 4 (four) times daily as needed (for dry eyes).   Combivent Respimat 20-100 MCG/ACT Aers respimat Generic drug:  Ipratropium-Albuterol Inhale 1 puff into the lungs every 6 (six) hours as needed for wheezing. What changed:  Another medication with the same name was removed. Continue taking this medication, and follow the directions you see here.   eucerin cream Apply  1 application topically See admin instructions. Apply small amount to affected area twice a day.   finasteride 5 MG tablet Commonly known as:  PROSCAR Take 5 mg by mouth daily.   hydrochlorothiazide 25 MG tablet Commonly known as:  HYDRODIURIL Take 25 mg by mouth daily.   levalbuterol 0.63 MG/3ML nebulizer solution Commonly known as:  XOPENEX Take 3 mLs (0.63 mg total) by nebulization every 6 (six) hours as needed for wheezing or shortness of breath.   levothyroxine 25 MCG tablet Commonly known as:  SYNTHROID Take 2 tablets (50 mcg total) by mouth daily before breakfast.   lisinopril 40 MG tablet Commonly known as:  ZESTRIL Take 40 mg by mouth daily.   metFORMIN 500 MG tablet Commonly known as:  GLUCOPHAGE Take 500 mg by mouth 2 (two) times daily with a meal.   metoprolol tartrate 50 MG tablet Commonly known as:  LOPRESSOR Take 25 mg by mouth 2 (two) times daily.   multivitamin with minerals tablet Take 1 tablet by mouth daily.   nitroGLYCERIN 0.4 MG SL tablet Commonly known as:  NITROSTAT Place 0.4 mg under the tongue every 5 (five) minutes as needed for chest pain. Dissolve one tablet under the tongue every 5 minutes as needed for chest pain. Repeat every 5 minutes if needed for a total of 3 tablets in 15 minutes. If no relief, call 911.   olopatadine 0.1 % ophthalmic solution Commonly known as:   PATANOL Place 1 drop into both eyes 2 (two) times daily.   omeprazole 20 MG capsule Commonly known as:  PRILOSEC Take 20 mg by mouth daily. Marland Kitchen.   senna-docusate 8.6-50 MG tablet Commonly known as:  Senokot-S Take 2 tablets by mouth at bedtime.   tamsulosin 0.4 MG Caps capsule Commonly known as:  FLOMAX Take 1 capsule (0.4 mg total) by mouth daily. Start taking on:  July 17, 2018            Durable Medical Equipment  (From admission, onward)         Start     Ordered   07/16/18 1157  For home use only DME 3 n 1  Once     07/16/18 1158   07/14/18 1330  For home use only DME Walker rolling  Once    Question:  Patient needs a walker to treat with the following condition  Answer:  History of unsteady gait   07/14/18 1329          Discharge Instructions: Please refer to Patient Instructions section of EMR for full details.  Patient was counseled important signs and symptoms that should prompt return to medical care, changes in medications, dietary instructions, activity restrictions, and follow up appointments.   Follow-Up Appointments: Follow-up Information    Bing NeighborsHarris, Kimberly S, FNP Follow up.   Specialty:  Family Medicine Contact information: [address] [place] [address] [phone]        Lewayne Buntingrenshaw, Brian S, MD .   Specialty:  Cardiology Contact information: [address] [place] [address] [phone]           Leland HerYoo, Vedh Ptacek J, DO 07/16/2018, 12:09 PM PGY-3, [facility]

## 2018-07-16 NOTE — Progress Notes (Signed)
Physical Therapy Treatment Patient Details Name: [name] Comp MRN: [id number] DOB: 01/07/1928 Today's Date: 07/16/2018    History of Present Illness [name] is a 85 y.o. male presenting with altered mental status. PMH is significant for prior episode of AMS, COPD, T2DM, atrial fibrillation on Eliquis, HTN, CAD, h/o recent and remote CVA's. Pt with AMS, increased unsteadiness, and urinary incontinence.     PT Comments    Patient seen for mobility progression. Pt is making progress toward PT goals. This session focused on gait training/balance without use of AD as pt reports he does not use AD at baseline. Overall pt requires min guard/min A for OOB mobility. Continue to progress as tolerated.    Follow Up Recommendations  Home health PT;Supervision/Assistance - 24 hour     Equipment Recommendations  Rolling walker with 5" wheels    Recommendations for Other Services       Precautions / Restrictions Precautions Precautions: Fall Precaution Comments: pt reports fall(s) at home Restrictions Weight Bearing Restrictions: No    Mobility  Bed Mobility Overal bed mobility: Needs Assistance Bed Mobility: Supine to Sit     Supine to sit: Supervision     General bed mobility comments: supervision for safety   Transfers Overall transfer level: Needs assistance Equipment used: Rolling walker (2 wheeled) Transfers: Sit to/from Stand Sit to Stand: Min guard         General transfer comment: min guard for stand to sit and cues to get closer to surface before sitting  Ambulation/Gait Ambulation/Gait assistance: Min guard;Min assist   Assistive device: Rolling walker (2 wheeled);None(use of gait belt at trunk for balance) Gait Pattern/deviations: Step-through pattern;Decreased stride length;Trunk flexed;Drifts right/left Gait velocity: decreased   General Gait Details: pt having difficulty navigating room with use of RW; pt requires assistance at trunk using gait  belt for balance without AD as pt demonstrates increased unsteadiness   Stairs Stairs: Yes Stairs assistance: Min guard;Min assist Stair Management: No rails;Two rails;Step to pattern;Alternating pattern;Forwards Number of Stairs: (2 steps X 2 trials ) General stair comments: min guard for safety using bilat rails and min A without use of rails   Wheelchair Mobility    Modified Rankin (Stroke Patients Only) Modified Rankin (Stroke Patients Only) Pre-Morbid Rankin Score: Moderate disability Modified Rankin: Moderately severe disability     Balance Overall balance assessment: Needs assistance Sitting-balance support: Feet supported;No upper extremity supported Sitting balance-Leahy Scale: Good     Standing balance support: During functional activity;No upper extremity supported Standing balance-Leahy Scale: Poor Standing balance comment: relaint on UE support dynamically                 Standardized Balance Assessment Standardized Balance Assessment : Dynamic Gait Index   Dynamic Gait Index Level Surface: Mild Impairment Change in Gait Speed: Mild Impairment Gait and Pivot Turn: Mild Impairment Step Over Obstacle: Mild Impairment Steps: Mild Impairment      Cognition Arousal/Alertness: Awake/alert Behavior During Therapy: WFL for tasks assessed/performed Overall Cognitive Status: History of cognitive impairments - at baseline Area of Impairment: Safety/judgement;Following commands;Memory;Problem solving;Awareness                     Memory: Decreased short-term memory Following Commands: Follows one step commands inconsistently;Follows one step commands with increased time Safety/Judgement: Decreased awareness of safety;Decreased awareness of deficits Awareness: Intellectual Problem Solving: Decreased initiation;Difficulty sequencing;Slow processing;Requires verbal cues;Requires tactile cues General Comments: pt continues to require multimodal cueing  to follow 1 step commands,  improved sequencing of self care tasks with minimal cueing       Exercises      General Comments General comments (skin integrity, edema, etc.): Attempted DGI however pt unable to follow instructions for some tasks      Pertinent Vitals/Pain Pain Assessment: No/denies pain    Home Living                      Prior Function            PT Goals (current goals can now be found in the care plan section) Acute Rehab PT Goals Patient Stated Goal: none stated Progress towards PT goals: Progressing toward goals    Frequency    Min 3X/week      PT Plan Discharge plan needs to be updated    Co-evaluation              AM-PAC PT "6 Clicks" Mobility   Outcome Measure  Help needed turning from your back to your side while in a flat bed without using bedrails?: A Little Help needed moving from lying on your back to sitting on the side of a flat bed without using bedrails?: A Little Help needed moving to and from a bed to a chair (including a wheelchair)?: A Little Help needed standing up from a chair using your arms (e.g., wheelchair or bedside chair)?: A Little Help needed to walk in hospital room?: A Little Help needed climbing 3-5 steps with a railing? : A Little 6 Click Score: 18    End of Session Equipment Utilized During Treatment: Gait belt Activity Tolerance: Patient tolerated treatment well Patient left: in chair;with chair alarm set;with call bell/phone within reach Nurse Communication: Mobility status PT Visit Diagnosis: Unsteadiness on feet (R26.81);Difficulty in walking, not elsewhere classified (R26.2)     Time: 0867-6195 PT Time Calculation (min) (ACUTE ONLY): 14 min  Charges:  $Gait Training: 8-22 mins                     Erline Levine, PTA Acute Rehabilitation Services Pager: [phone] Office: [phone]     Carolynne Edouard 07/16/2018, 1:05 PM

## 2018-07-17 ENCOUNTER — Telehealth: Payer: Self-pay

## 2018-07-17 ENCOUNTER — Telehealth: Payer: Self-pay | Admitting: Family Medicine

## 2018-07-17 NOTE — Telephone Encounter (Signed)
[facility] called to inform PCP that patient was released from the hospital yesterday and that his blood sugar was 165 and it has gone up to 422 as of 15 minutes ago. Please follow up, Trey Paula stated he would be at patients home for a little while and provider could contact him directly.

## 2018-07-17 NOTE — Telephone Encounter (Signed)
No further action warranted. Thanks for providing appropriate education

## 2018-07-17 NOTE — Telephone Encounter (Signed)
Transition Care Management Follow-up Telephone Call  The call was completed with patient's son, [name] ( DPR on file)  Patient is aphasic.    Date of discharge and from where: 07/16/2018, [facility]  How have you been since you were released from the hospital? [name] stated that the patient is " doing all right today."   Any questions or concerns? No questions/concerns at this time.  [name] stated that he already spoke to the nurse from the clinic about his father's blood sugars.  He said that the blood sugar is " coming down" but did not have the most recent result. He stated that they are encouraging his father to drink water as instructed by the nurse.   Items Reviewed:  Did the pt receive and understand the discharge instructions provided? [name] stated that they have the instructions and did not have any questions.   Medications obtained and verified? [name] stated that they have all medications and  said the is pretty sure that the new medication - tamsulosin has been picked up at the pharmacy. He did not have questions about the medications. He said he would check with his wife. Instructed him to have his wife call the clinic if she has questions and /or would like to review the medication list.   Any new allergies since your discharge? None reported   Do you have support at home? Son and daughter in law and their 59 year old daughter provide 24/7 supervision.   Other (ie: DME, Home Health, etc) [facility] nurse has already seen patient today.    Patient has 3:1 commode in addition to RW and cane  Has glucometer  Has nebulizer  Has home O2  - ordered through [place]. [name] said that he uses it @ 2L/min when sleeping and during the day, he uses it  as needed.   As per [name], [place] pays for medications  Functional Questionnaire: (I = Independent and D = Dependent) ADL's: needs assistance. Daughter in Social worker oversees medication management. Family assists with  ADLS as needed and provides 24/7 supervision.  He is not left alone. Has RW for ambulation but his son stated that he ambulated to the mailbox today and did not need a RW.  Close stand by assist with ambulation provided. [name] also noted that his father's balance was ' good today."      Follow up appointments reviewed:    PCP Hospital f/u appt confirmed? televisit scheduled for 07/22/2018 @ 1050. Daughter in law, [name] , to be present for call.  Family can bring him to the clinic if needed  Specialist Hospital f/u appt confirmed? [name] stated that the appointment for eye surgery has been cancelled and will need to be re-scheduled.  .  Are transportation arrangements needed? No, family provides transportation.   If their condition worsens, is the pt aware to call  their PCP or go to the ED? Yes  Was the patient provided with contact information for the PCP's office or ED? They have the clinic phone #.  Was the pt encouraged to call back with questions or concerns? yes

## 2018-07-17 NOTE — Telephone Encounter (Signed)
Blood sugar this morning, his blood sugar was 422. Per Trey Paula he was asymtomatic. No s/s of hyperglycemia. His blood sugars are normally been ranging 123-165. Per Trey Paula, he had taken medication.   Spoke to his caregiver. States he had grits and jelly toast. Unsure if the meter needs to be updated.  Uses the bathroom as normal and not drinking a lot of water.  Last night he had a slice of pizza when he came home from the hospital.   Education provided about carbohydrates from meals he consumed last night and today. Encourage patient to drink more water and monitor diet and blood sugars throughout the night.  Informed message would be routed to his PCP and would be called back for further recommendations per provider.

## 2018-07-18 ENCOUNTER — Emergency Department (HOSPITAL_COMMUNITY)
Admission: EM | Admit: 2018-07-18 | Discharge: 2018-07-19 | Disposition: A | Discharge status: Home / Self Care | Payer: Medicare Other | Attending: Emergency Medicine | Admitting: Emergency Medicine

## 2018-07-18 ENCOUNTER — Other Ambulatory Visit: Payer: Self-pay

## 2018-07-18 DIAGNOSIS — R072 Precordial pain: Secondary | ICD-10-CM | POA: Diagnosis not present

## 2018-07-18 DIAGNOSIS — I4891 Unspecified atrial fibrillation: Secondary | ICD-10-CM | POA: Diagnosis not present

## 2018-07-18 DIAGNOSIS — R0789 Other chest pain: Secondary | ICD-10-CM | POA: Diagnosis present

## 2018-07-18 DIAGNOSIS — Z7984 Long term (current) use of oral hypoglycemic drugs: Secondary | ICD-10-CM | POA: Diagnosis not present

## 2018-07-18 DIAGNOSIS — Z87891 Personal history of nicotine dependence: Secondary | ICD-10-CM | POA: Insufficient documentation

## 2018-07-18 DIAGNOSIS — J449 Chronic obstructive pulmonary disease, unspecified: Secondary | ICD-10-CM | POA: Diagnosis not present

## 2018-07-18 DIAGNOSIS — Z79899 Other long term (current) drug therapy: Secondary | ICD-10-CM | POA: Insufficient documentation

## 2018-07-18 DIAGNOSIS — I10 Essential (primary) hypertension: Secondary | ICD-10-CM | POA: Diagnosis not present

## 2018-07-18 DIAGNOSIS — I249 Acute ischemic heart disease, unspecified: Secondary | ICD-10-CM | POA: Insufficient documentation

## 2018-07-18 DIAGNOSIS — E119 Type 2 diabetes mellitus without complications: Secondary | ICD-10-CM | POA: Insufficient documentation

## 2018-07-18 NOTE — ED Triage Notes (Signed)
Pt arrived EMS from home for c/o chest pain earlier today, reports taking 2 nitro earlier today. Pt reports no current complaints and has been CP free for at least 2 hours. Refused ASA due to taking blood thinners.  A&O x 3 (not time) baseline. Was discharged yesterday after a week stay.  18G RFA P 88 Afib, PVC BP 124/74 SPO2 93% O2 (prescribed home O2 2L PRN) CBG 242 T 97.9

## 2018-07-19 ENCOUNTER — Emergency Department (HOSPITAL_COMMUNITY): Payer: Medicare Other

## 2018-07-19 ENCOUNTER — Telehealth: Payer: Self-pay | Admitting: *Deleted

## 2018-07-19 ENCOUNTER — Other Ambulatory Visit: Payer: Self-pay

## 2018-07-19 ENCOUNTER — Encounter (HOSPITAL_COMMUNITY): Payer: Self-pay | Admitting: Emergency Medicine

## 2018-07-19 LAB — TROPONIN I
Troponin I: <0.03 ng/mL (ref <0.03)
Troponin I: <0.03 ng/mL (ref <0.03)

## 2018-07-19 LAB — BASIC METABOLIC PANEL
Anion gap: 11 (ref 5–15)
BUN: 23 mg/dL (ref 8–23)
CO2: 22 mmol/L (ref 22–32)
Calcium: 9 mg/dL (ref 8.9–10.3)
Chloride: 107 mmol/L (ref 98–111)
Creatinine, Ser: 1.33 mg/dL — ABNORMAL HIGH (ref 0.61–1.24)
GFR calc Af Amer: 55 mL/min — ABNORMAL LOW (ref >60)
GFR calc non Af Amer: 47 mL/min — ABNORMAL LOW (ref >60)
Glucose, Bld: 189 mg/dL — ABNORMAL HIGH (ref 70–99)
Potassium: 3.9 mmol/L (ref 3.5–5.1)
Sodium: 140 mmol/L (ref 135–145)

## 2018-07-19 LAB — CBC
HCT: 38.7 % — ABNORMAL LOW (ref 39.0–52.0)
Hemoglobin: 12.3 g/dL — ABNORMAL LOW (ref 13.0–17.0)
MCH: 29.2 pg (ref 26.0–34.0)
MCHC: 31.8 g/dL (ref 30.0–36.0)
MCV: 91.9 fL (ref 80.0–100.0)
Platelets: 191 10*3/uL (ref 150–400)
RBC: 4.21 MIL/uL — ABNORMAL LOW (ref 4.22–5.81)
RDW: 13.9 % (ref 11.5–15.5)
WBC: 5.4 10*3/uL (ref 4.0–10.5)
nRBC: 0 % (ref 0.0–0.2)

## 2018-07-19 NOTE — ED Provider Notes (Signed)
[facility] EMERGENCY DEPARTMENT Provider Note   CSN: [id number] Arrival date & time: 07/18/18  2352    History   Chief Complaint Chief Complaint  Patient presents with  . Chest Pain    HPI [name] is a 85 y.o. male.     Patient brought to the emergency department from home by EMS.  Patient developed chest pain earlier tonight.  Patient took 2 sublingual nitroglycerin tablets at home and became pain-free.  Patient's son reportedly called EMS.  EMS reports that he has not had any chest pain during their care.  Oxygen saturations have been low 90s, but he does use oxygen at home and has a history of COPD.  He does not have any shortness of breath symptomatically at this time.     Past Medical History:  Diagnosis Date  . Atrial fibrillation (HCC)   . COPD (chronic obstructive pulmonary disease) (HCC)   . CVA (cerebral infarction)   . Diabetes mellitus without complication (HCC)   . Essential hypertension, benign   . Hard of hearing     Patient Active Problem List   Diagnosis Date Noted  . AMS (altered mental status) 07/14/2018  . Altered mental status   . Stroke (HCC) 07/10/2018  . Requires supplemental oxygen 06/05/2018  . Chest pain 03/29/2018  . Atrial fibrillation (HCC) 05/20/2015  . RBBB 05/20/2015  . Prolonged Q-T interval on ECG 05/20/2015  . CAD (coronary artery disease) 05/20/2015  . History of CVA (cerebrovascular accident) 05/20/2015  . Atrial fibrillation with RVR (HCC)   . Bronchiectasis with acute exacerbation (HCC)   . Unstable angina (HCC) 03/27/2013  . COPD exacerbation (HCC) 03/25/2013  . Diabetes mellitus type II, uncontrolled (HCC) 03/25/2013  . Hypothyroidism 03/25/2013  . BPH (benign prostatic hyperplasia) 03/25/2013  . HTN (hypertension) 03/25/2013    Past Surgical History:  Procedure Laterality Date  . HERNIA REPAIR    . LEFT HEART CATHETERIZATION WITH CORONARY ANGIOGRAM N/A 03/28/2013   Procedure: LEFT HEART  CATHETERIZATION WITH CORONARY ANGIOGRAM;  Surgeon: Peter M Swaziland, MD;  Location: [facility] CATH LAB;  Service: Cardiovascular;  Laterality: N/A;        Home Medications    Prior to Admission medications   Medication Sig Start Date End Date Taking? Authorizing Provider  albuterol (PROVENTIL HFA;VENTOLIN HFA) 108 (90 Base) MCG/ACT inhaler Inhale 2 puffs into the lungs every 4 (four) hours as needed for wheezing or shortness of breath. 05/17/15   Horton, Mayer Masker, MD  amLODipine (NORVASC) 5 MG tablet Take 5 mg by mouth daily.    [provider]  apixaban (ELIQUIS) 5 MG TABS tablet Take 1 tablet (5 mg total) by mouth 2 (two) times daily. 02/03/18   Marguerita Merles Latif, DO  atorvastatin (LIPITOR) 80 MG tablet Take 1 tablet (80 mg total) by mouth daily at 6 PM. 07/12/18   Leland Her, DO  brimonidine (ALPHAGAN) 0.15 % ophthalmic solution Place 1 drop into both eyes 2 (two) times daily.     [provider]  budesonide-formoterol (SYMBICORT) 80-4.5 MCG/ACT inhaler Inhale 2 puffs into the lungs 2 (two) times daily.    [provider]  Carboxymethylcellulose Sodium 0.25 % SOLN Place 2 drops into both eyes 4 (four) times daily as needed (for dry eyes).     [provider]  finasteride (PROSCAR) 5 MG tablet Take 5 mg by mouth daily.    [provider]  hydrochlorothiazide (HYDRODIURIL) 25 MG tablet Take 25 mg by mouth  daily.     [provider]  Ipratropium-Albuterol (COMBIVENT RESPIMAT) 20-100 MCG/ACT AERS respimat Inhale 1 puff into the lungs every 6 (six) hours as needed for wheezing.     [provider]  levalbuterol Pauline Aus) 0.63 MG/3ML nebulizer solution Take 3 mLs (0.63 mg total) by nebulization every 6 (six) hours as needed for wheezing or shortness of breath. 02/03/18   Marguerita Merles Latif, DO  levothyroxine (SYNTHROID, LEVOTHROID) 25 MCG tablet Take 2 tablets (50 mcg total) by mouth daily before breakfast. 06/11/18   Bing Neighbors, FNP   lisinopril (PRINIVIL,ZESTRIL) 40 MG tablet Take 40 mg by mouth daily.    [provider]  metFORMIN (GLUCOPHAGE) 500 MG tablet Take 500 mg by mouth 2 (two) times daily with a meal.     [provider]  metoprolol (LOPRESSOR) 50 MG tablet Take 25 mg by mouth 2 (two) times daily.    [provider]  Multiple Vitamins-Minerals (MULTIVITAMIN WITH MINERALS) tablet Take 1 tablet by mouth daily.    [provider]  nitroGLYCERIN (NITROSTAT) 0.4 MG SL tablet Place 0.4 mg under the tongue every 5 (five) minutes as needed for chest pain. Dissolve one tablet under the tongue every 5 minutes as needed for chest pain. Repeat every 5 minutes if needed for a total of 3 tablets in 15 minutes. If no relief, call 911.    [provider]  olopatadine (PATANOL) 0.1 % ophthalmic solution Place 1 drop into both eyes 2 (two) times daily. 06/05/18   Bing Neighbors, FNP  omeprazole (PRILOSEC) 20 MG capsule Take 20 mg by mouth daily. .    [provider]  senna-docusate (SENOKOT-S) 8.6-50 MG tablet Take 2 tablets by mouth at bedtime. 06/05/18   Bing Neighbors, FNP  Skin Protectants, Misc. (EUCERIN) cream Apply 1 application topically See admin instructions. Apply small amount to affected area twice a day.    [provider]  tamsulosin (FLOMAX) 0.4 MG CAPS capsule Take 1 capsule (0.4 mg total) by mouth daily. 07/17/18   Leland Her, DO    Family History Family History  Problem Relation Age of Onset  . CAD Son   . Skin cancer Brother     Social History Social History   Tobacco Use  . Smoking status: Former Smoker    Types: Cigarettes  . Smokeless tobacco: Former Engineer, water Use Topics  . Alcohol use: No  . Drug use: No     Allergies   Patient has no known allergies.   Review of Systems Review of Systems  Cardiovascular: Positive for chest pain.  All other systems reviewed and are negative.    Physical Exam Updated Vital Signs  BP (!) 125/57   Pulse 61   Temp 98.7 F (37.1 C) (Oral)   Resp 20   SpO2 99%   Physical Exam Vitals signs and nursing note reviewed.  Constitutional:      General: He is not in acute distress.    Appearance: Normal appearance. He is well-developed.  HENT:     Head: Normocephalic and atraumatic.     Right Ear: Hearing normal.     Left Ear: Hearing normal.     Nose: Nose normal.  Eyes:     Conjunctiva/sclera: Conjunctivae normal.     Pupils: Pupils are equal, round, and reactive to light.  Neck:     Musculoskeletal: Normal range of motion and neck supple.  Cardiovascular:     Rate and Rhythm: Regular  rhythm.     Heart sounds: S1 normal and S2 normal. No murmur. No friction rub. No gallop.   Pulmonary:     Effort: Pulmonary effort is normal. No respiratory distress.     Breath sounds: Normal breath sounds.  Chest:     Chest wall: No tenderness.  Abdominal:     General: Bowel sounds are normal.     Palpations: Abdomen is soft.     Tenderness: There is no abdominal tenderness. There is no guarding or rebound. Negative signs include Murphy's sign and McBurney's sign.     Hernia: No hernia is present.  Musculoskeletal: Normal range of motion.  Skin:    General: Skin is warm and dry.     Findings: No rash.  Neurological:     Mental Status: He is alert and oriented to person, place, and time.     GCS: GCS eye subscore is 4. GCS verbal subscore is 5. GCS motor subscore is 6.     Cranial Nerves: No cranial nerve deficit.     Sensory: No sensory deficit.     Coordination: Coordination normal.  Psychiatric:        Speech: Speech normal.        Behavior: Behavior normal.        Thought Content: Thought content normal.      ED Treatments / Results  Labs (all labs ordered are listed, but only abnormal results are displayed) Labs Reviewed  CBC - Abnormal; Notable for the following components:      Result Value   RBC 4.21 (*)    Hemoglobin 12.3 (*)    HCT 38.7 (*)    All  other components within normal limits  BASIC METABOLIC PANEL - Abnormal; Notable for the following components:   Glucose, Bld 189 (*)    Creatinine, Ser 1.33 (*)    GFR calc non Af Amer 47 (*)    GFR calc Af Amer 55 (*)    All other components within normal limits  TROPONIN I  TROPONIN I    EKG EKG Interpretation  Date/Time:  Thursday July 18 2018 23:58:38 EDT Ventricular Rate:  80 PR Interval:    QRS Duration: 145 QT Interval:  407 QTC Calculation: 470 R Axis:   -82 Text Interpretation:  Sinus rhythm Ventricular trigeminy RBBB and LAFB No significant change since last tracing Confirmed by Gilda CreasePollina, Icis Budreau J [id number]) on 07/19/2018 12:10:05 AM   Radiology Dg Chest Port 1 View  Result Date: 07/19/2018 CLINICAL DATA:  Chest pain EXAM: PORTABLE CHEST 1 VIEW COMPARISON:  1920 FINDINGS: The lungs are hyperinflated with diffuse interstitial prominence. No focal airspace consolidation or pulmonary edema. No pleural effusion or pneumothorax. Normal cardiomediastinal contours. There is bibasilar atelectasis. IMPRESSION: COPD without acute airspace disease.  Bibasilar atelectasis. Electronically Signed   By: Deatra RobinsonKevin  Herman M.D.   On: 07/19/2018 00:32    Procedures Procedures (including critical care time)  Medications Ordered in ED Medications - No data to display   Initial Impression / Assessment and Plan / ED Course  I have reviewed the triage vital signs and the nursing notes.  Pertinent labs & imaging results that were available during my care of the patient were reviewed by me and considered in my medical decision making (see chart for details).        Patient presents to the emergency department for evaluation of chest pain.  Patient does have a history of coronary artery disease.  He developed chest pain earlier tonight  at home.  Patient took 2 sublingual nitroglycerin tablets and became pain-free.  EMS report that he was pain-free upon their arrival and he has continued  to have no further symptoms here in the ER.  His EKG is unchanged from previous.  Troponin at arrival was negative.  This was repeated at the 3-hour mark and was still undetectable.  Chest x-ray is clear, evidence of COPD but no acute abnormality.  Remainder of blood work was unremarkable.  As patient achieved pain-free status with his own nitroglycerin and has not had any recurrence of symptoms, I believe patient would be best served being discharged.  He is at significant risk for COVID-19 secondary to pandemic and based on his age and comorbidities would have a significant mortality rate if he were to contract the illness.  He will therefore be discharged, follow-up with cardiology, return for recurrent symptoms.  Final Clinical Impressions(s) / ED Diagnoses   Final diagnoses:  Precordial pain    ED Discharge Orders    None       Gilda Crease, MD 07/19/18 [phone]

## 2018-07-19 NOTE — Telephone Encounter (Signed)
Gave verbal order for one time per week for 5 weeks They should contact patients pcp for remainder of orders

## 2018-07-19 NOTE — ED Notes (Signed)
Patient's son, Ronald(emergency contact) called to pick patient up upon discharge.

## 2018-07-19 NOTE — Telephone Encounter (Signed)
Jim from [facility] calling for PT verbal orders as follows:  1 time(s) weekly for 5 week(s), then 1 time every other week for 4 week(s)  You can leave verbal orders on confidential voicemail.  Pt was seen By. Dr. Pollie Meyer in hospital and PT order placed @ discharge.  Jone Baseman, CMA

## 2018-07-19 NOTE — ED Notes (Signed)
Went over DC instructions with patient's son d/t patient's cognitive impairments, son verbalized understanding of dc instructions. Patient's vss, taken out of ED via wheelchair with nad.

## 2018-07-22 ENCOUNTER — Other Ambulatory Visit: Payer: Self-pay

## 2018-07-22 ENCOUNTER — Ambulatory Visit (INDEPENDENT_AMBULATORY_CARE_PROVIDER_SITE_OTHER): Payer: Medicare Other | Admitting: Family Medicine

## 2018-07-22 ENCOUNTER — Encounter: Payer: Self-pay | Admitting: Family Medicine

## 2018-07-22 DIAGNOSIS — J441 Chronic obstructive pulmonary disease with (acute) exacerbation: Secondary | ICD-10-CM | POA: Diagnosis not present

## 2018-07-22 DIAGNOSIS — R339 Retention of urine, unspecified: Secondary | ICD-10-CM

## 2018-07-22 DIAGNOSIS — Z8673 Personal history of transient ischemic attack (TIA), and cerebral infarction without residual deficits: Secondary | ICD-10-CM

## 2018-07-22 DIAGNOSIS — R0602 Shortness of breath: Secondary | ICD-10-CM

## 2018-07-22 DIAGNOSIS — I2510 Atherosclerotic heart disease of native coronary artery without angina pectoris: Secondary | ICD-10-CM

## 2018-07-22 DIAGNOSIS — F5104 Psychophysiologic insomnia: Secondary | ICD-10-CM

## 2018-07-22 DIAGNOSIS — R451 Restlessness and agitation: Secondary | ICD-10-CM

## 2018-07-22 DIAGNOSIS — R410 Disorientation, unspecified: Secondary | ICD-10-CM

## 2018-07-22 DIAGNOSIS — I480 Paroxysmal atrial fibrillation: Secondary | ICD-10-CM

## 2018-07-22 DIAGNOSIS — Z9181 History of falling: Secondary | ICD-10-CM

## 2018-07-22 DIAGNOSIS — R4189 Other symptoms and signs involving cognitive functions and awareness: Secondary | ICD-10-CM

## 2018-07-22 MED ORDER — HYDROXYZINE HCL 50 MG PO TABS: 50.0000 mg | ORAL_TABLET | Freq: Three times a day (TID) | ORAL | 0 refills | Status: DC | PRN

## 2018-07-22 MED ORDER — GLUCOSE BLOOD VI STRP: ORAL_STRIP | 12 refills | Status: DC

## 2018-07-22 MED ORDER — TAMSULOSIN HCL 0.4 MG PO CAPS: 0.4000 mg | ORAL_CAPSULE | Freq: Every day | ORAL | 0 refills | Status: DC

## 2018-07-22 MED ORDER — METFORMIN HCL 500 MG PO TABS: 500.0000 mg | ORAL_TABLET | Freq: Two times a day (BID) | ORAL | 3 refills | Status: DC

## 2018-07-22 NOTE — Progress Notes (Deleted)
[name] daughter- in- law.   Unable to void at times and has pain. Taking tamsulosin.  Last complaint was Saturday night but has gotten better  Gets confused and disoriented at night.  This corrects itself during the day time.  He is at baseline during daytime.    Needs RF on Metformin and glucose strips  Needs a HH-RN to meet with him.   Blood sugar have improved  Blood pressures are taken with home health PT and OT- Reports that they have been WNL.

## 2018-07-22 NOTE — Progress Notes (Signed)
Virtual Visit via Telephone Note  I connected with [name] on 07/22/18 at 10:50 AM EDT by telephone and verified that I am speaking with the correct person using two identifiers.  Speaking with daughter in law [name].   I discussed the limitations, risks, security and privacy concerns of performing an evaluation and management service by telephone and the availability of in person appointments. I also discussed with the patient that there may be a patient responsible charge related to this service. The patient expressed understanding and agreed to proceed.   Provider located within primary care office.   History of Present Illness: [name] medical history is significant or Afib on eliquis, COPD, hypothyroidism, DM, HTN, CAD, h/o CVA.  Hospital follow-up  [facility] due to Altered Mental Status, right sided weakness, and expressive aphasia on 07/10/18. He was found to have had a CVA of unknown origin. He suffers from Atrial fibrillation and family (daughter-in-law, Taylor)reports compliance with Eliquis. He remained impatient until 07/12/18 and was discharge home with family. The only medication change Astrovastain increased to 80 mg once daily. HH ordered to include PT and OT. Family subsequently brought patient back to the ER 07/14/18 and patient was admitted to impatient services for altered mental status, urinary incontinence, and unsteady gait, s/p recent impatient admission on 07/10/18 for CVA. He was admitted for altered mental status. He was started on Flomax to regulate urine flow and subsequently discharged home 07/16/18 with continued HH. It noted in discharge summary concern that family could not adequately manage his care at home. Family declined SNF placement.  Concerns today:  Altered Mental Status  [name] reports [name] is wandering at night within the home with confusion. He is often unable to recognize his grandchildren, forgets that  it is nighttime, and his current location. He lives with his family and [name] reports his bedroom is directly across the hall from their room. Family recently moved to new home prior to patient's admission was concern that the change of environment could be causing the confusion. Patient is mentally stable during the day. He is now receiving in home PT/OT to improve strength, balance, and perform ADL.  He has not experienced any recent falls. Family reports someone is near patient most of time when he ambulates. [facility] although [facility] for [facility] while [facility]. Family is concern for safety, especially at night due to patient is up frequently throughout the night and confused. He sleeps for a few hours at most. This is a new issues and insomnia or wandering has never occurred previously.   Diabetes [name] reports [name]'s blood sugars have been running much higher than previously with several readings greater than 180. Diabetes managed with metformin only. He eats a non-restrictive diet. She has not checked [name]'s blood sugar today, uncertain of control today. She has not noticed any increase in urine, appetite, or thirst.   Urine retention During [name]'s last admission he experienced problems with urine incontinence and retention which persisted after returning home. He is currently taking Tamsulosin and issues with incontinence and retention have improved within the last two days.   Assessment and Plan: 1. COPD exacerbation (HCC), controlled for now  Continue LABA and SABA (PRN)  2. Paroxysmal atrial fibrillation (HCC) -Continue Eliquis and do not skip doses  3. Confusion and disorientation HH RN ordered to evaluate environment for safety. Patient is high risk for falling.  - Home Health - Tele-health  encounter (required for Medicare/Medicaid patients)  4. Urinary retention -Continue Tamulosin -Report to the ER if patient develops  retention again and is unable to urinate  5. History of CVA (cerebrovascular accident), last CVA 07/10/18 - Home Health RN ordered for medication management and to evaluate environment for safety. During recent impatient admission, concern for was noted that family is unable to adequately care for patient. HH RN recommended to ensure patient and family are appropriately educated on disease process and medication management.  6. Cognitive change, suspect early symptoms associated with dementia given sun-downing.  See # 3 - Home Health - Face-to-face encounter (required for Medicare/Medicaid patients)  7. At high risk for falls - Home Health - Tele-health encounter (required for Medicare/Medicaid patients)  8. SOB (shortness of breath) on exertion - Home Health, evaluate medication adherence and proper use of inhalers Tele-health encounter (required for Medicare/Medicaid patients)  9. Agitation/Insomia -Will prescribe low-dose Vistaril to aid with sleep and to decrease agitation. Advised family medication can increase risk of falling therefore do no administer more than prescribed and use on a PRN basis only.   Patient visit occurred during a Tele-Health encounter. This was included on order for RN home health.   Follow Up Instructions: RTC: 8 weeks for A1C and evaluation of cognitive status    I discussed the assessment and treatment plan with the patient. The patient was provided an opportunity to ask questions and all were answered. The patient agreed with the plan and demonstrated an understanding of the instructions.   The patient was advised to call back or seek an in-person evaluation if the symptoms worsen or if the condition fails to improve as anticipated.  I provided 20 minutes of non-face-to-face time during this encounter.   Bruce Courts, FNP

## 2018-07-24 ENCOUNTER — Other Ambulatory Visit: Payer: Self-pay

## 2018-07-24 ENCOUNTER — Telehealth: Payer: Self-pay | Admitting: Family Medicine

## 2018-07-24 ENCOUNTER — Emergency Department (HOSPITAL_COMMUNITY)
Admission: EM | Admit: 2018-07-24 | Discharge: 2018-07-24 | Disposition: A | Discharge status: Home / Self Care | Payer: Medicare Other | Attending: Emergency Medicine | Admitting: Emergency Medicine

## 2018-07-24 ENCOUNTER — Encounter (HOSPITAL_COMMUNITY): Payer: Self-pay | Admitting: Emergency Medicine

## 2018-07-24 DIAGNOSIS — Z7984 Long term (current) use of oral hypoglycemic drugs: Secondary | ICD-10-CM | POA: Diagnosis not present

## 2018-07-24 DIAGNOSIS — J449 Chronic obstructive pulmonary disease, unspecified: Secondary | ICD-10-CM | POA: Diagnosis not present

## 2018-07-24 DIAGNOSIS — I2510 Atherosclerotic heart disease of native coronary artery without angina pectoris: Secondary | ICD-10-CM | POA: Insufficient documentation

## 2018-07-24 DIAGNOSIS — E039 Hypothyroidism, unspecified: Secondary | ICD-10-CM | POA: Diagnosis not present

## 2018-07-24 DIAGNOSIS — Z79899 Other long term (current) drug therapy: Secondary | ICD-10-CM | POA: Diagnosis not present

## 2018-07-24 DIAGNOSIS — I10 Essential (primary) hypertension: Secondary | ICD-10-CM | POA: Insufficient documentation

## 2018-07-24 DIAGNOSIS — R300 Dysuria: Secondary | ICD-10-CM | POA: Diagnosis present

## 2018-07-24 DIAGNOSIS — R410 Disorientation, unspecified: Secondary | ICD-10-CM | POA: Diagnosis not present

## 2018-07-24 DIAGNOSIS — Z87891 Personal history of nicotine dependence: Secondary | ICD-10-CM | POA: Insufficient documentation

## 2018-07-24 DIAGNOSIS — Z8673 Personal history of transient ischemic attack (TIA), and cerebral infarction without residual deficits: Secondary | ICD-10-CM | POA: Diagnosis not present

## 2018-07-24 DIAGNOSIS — E119 Type 2 diabetes mellitus without complications: Secondary | ICD-10-CM | POA: Insufficient documentation

## 2018-07-24 DIAGNOSIS — Z7901 Long term (current) use of anticoagulants: Secondary | ICD-10-CM | POA: Diagnosis not present

## 2018-07-24 LAB — URINALYSIS, ROUTINE W REFLEX MICROSCOPIC
Bilirubin Urine: NEGATIVE
Glucose, UA: NEGATIVE mg/dL
Hgb urine dipstick: NEGATIVE
Ketones, ur: NEGATIVE mg/dL
Leukocytes,Ua: NEGATIVE
Nitrite: NEGATIVE
Protein, ur: NEGATIVE mg/dL
Specific Gravity, Urine: <1.005 — ABNORMAL LOW (ref 1.005–1.030)
pH: 7 (ref 5.0–8.0)

## 2018-07-24 LAB — CBC WITH DIFFERENTIAL/PLATELET
Abs Immature Granulocytes: 0.02 10*3/uL (ref 0.00–0.07)
Basophils Absolute: 0.1 10*3/uL (ref 0.0–0.1)
Basophils Relative: 1 %
Eosinophils Absolute: 0.3 10*3/uL (ref 0.0–0.5)
Eosinophils Relative: 6 %
HCT: 41.1 % (ref 39.0–52.0)
Hemoglobin: 13 g/dL (ref 13.0–17.0)
Immature Granulocytes: 0 %
Lymphocytes Relative: 22 %
Lymphs Abs: 1.2 10*3/uL (ref 0.7–4.0)
MCH: 28.6 pg (ref 26.0–34.0)
MCHC: 31.6 g/dL (ref 30.0–36.0)
MCV: 90.5 fL (ref 80.0–100.0)
Monocytes Absolute: 0.7 10*3/uL (ref 0.1–1.0)
Monocytes Relative: 12 %
Neutro Abs: 3.4 10*3/uL (ref 1.7–7.7)
Neutrophils Relative %: 59 %
Platelets: 216 10*3/uL (ref 150–400)
RBC: 4.54 MIL/uL (ref 4.22–5.81)
RDW: 13.8 % (ref 11.5–15.5)
WBC: 5.7 10*3/uL (ref 4.0–10.5)
nRBC: 0 % (ref 0.0–0.2)

## 2018-07-24 LAB — COMPREHENSIVE METABOLIC PANEL
ALT: 17 U/L (ref 0–44)
AST: 19 U/L (ref 15–41)
Albumin: 3.8 g/dL (ref 3.5–5.0)
Alkaline Phosphatase: 51 U/L (ref 38–126)
Anion gap: 9 (ref 5–15)
BUN: 21 mg/dL (ref 8–23)
CO2: 26 mmol/L (ref 22–32)
Calcium: 9.4 mg/dL (ref 8.9–10.3)
Chloride: 105 mmol/L (ref 98–111)
Creatinine, Ser: 1.43 mg/dL — ABNORMAL HIGH (ref 0.61–1.24)
GFR calc Af Amer: 50 mL/min — ABNORMAL LOW (ref >60)
GFR calc non Af Amer: 43 mL/min — ABNORMAL LOW (ref >60)
Glucose, Bld: 125 mg/dL — ABNORMAL HIGH (ref 70–99)
Potassium: 4.1 mmol/L (ref 3.5–5.1)
Sodium: 140 mmol/L (ref 135–145)
Total Bilirubin: 0.7 mg/dL (ref 0.3–1.2)
Total Protein: 6.2 g/dL — ABNORMAL LOW (ref 6.5–8.1)

## 2018-07-24 NOTE — Discharge Instructions (Signed)
Seen today for confusion and urinary symptoms.  Your work-up is reassuring.  Follow-up with your primary physician.  Additionally follow-up with urology if you have persistent urinary symptoms.

## 2018-07-24 NOTE — Telephone Encounter (Signed)
Called Marylene Land to give the verbal ok.

## 2018-07-24 NOTE — Addendum Note (Signed)
Addended by: Bing Neighbors on: 07/24/2018 08:01 AM   Modules accepted: Orders

## 2018-07-24 NOTE — ED Triage Notes (Addendum)
BIB [facility] from home with c/o of AMS. Pt recently discharged with stroke resulting in right sided weakness. Per EMS pt became more confused yesterday and complaining of burning with urination. Denies SOB, Family denies being sick or pt leaving home. Temp 98.1 on arrival. Pt alert to person only on arrival. Normal is A&0X4. On 2L 02 chronically.

## 2018-07-24 NOTE — Telephone Encounter (Signed)
Please fax HH RN order to [facility]. Please include recent office telehealth encounter with order.

## 2018-07-24 NOTE — ED Provider Notes (Signed)
[facility] EMERGENCY DEPARTMENT Provider Note   CSN: [id number] Arrival date & time: 07/24/18  0444    History   Chief Complaint Chief Complaint  Patient presents with  . Altered Mental Status    HPI [name] is a 85 y.o. male.     HPI  This is an 85 year old male with a history of atrial fibrillation, COPD on home oxygen, CVA, diabetes who presents with dysuria and reported altered mental status.  Per EMS, they were called out for altered mental status.  He is only oriented to himself.  He recently had a stroke and since that time has had several presentations with confusion and altered mental status.  On my evaluation the patient, he states "I cannot pee."  He denies any fevers, shortness of breath, chest pain, abdominal pain, nausea, vomiting.  He does not provide much additional information.  I have reviewed his chart.  Patient has recently had several admissions.  Initial admission was for a CVA.  He subsequently was admitted for confusion and altered mental status.  Recommendation for SNF placement; however, family decided to take him home.  I have attempted to contact family for collateral information but have not been able to reach them.  Level 5 caveat  Past Medical History:  Diagnosis Date  . Atrial fibrillation (HCC)   . COPD (chronic obstructive pulmonary disease) (HCC)   . CVA (cerebral infarction)   . Diabetes mellitus without complication (HCC)   . Essential hypertension, benign   . Hard of hearing     Patient Active Problem List   Diagnosis Date Noted  . AMS (altered mental status) 07/14/2018  . Altered mental status   . Stroke (HCC) 07/10/2018  . Requires supplemental oxygen 06/05/2018  . Chest pain 03/29/2018  . Atrial fibrillation (HCC) 05/20/2015  . RBBB 05/20/2015  . Prolonged Q-T interval on ECG 05/20/2015  . CAD (coronary artery disease) 05/20/2015  . History of CVA (cerebrovascular accident) 05/20/2015  . Atrial  fibrillation with RVR (HCC)   . Bronchiectasis with acute exacerbation (HCC)   . Unstable angina (HCC) 03/27/2013  . COPD exacerbation (HCC) 03/25/2013  . Diabetes mellitus type II, uncontrolled (HCC) 03/25/2013  . Hypothyroidism 03/25/2013  . BPH (benign prostatic hyperplasia) 03/25/2013  . HTN (hypertension) 03/25/2013    Past Surgical History:  Procedure Laterality Date  . HERNIA REPAIR    . LEFT HEART CATHETERIZATION WITH CORONARY ANGIOGRAM N/A 03/28/2013   Procedure: LEFT HEART CATHETERIZATION WITH CORONARY ANGIOGRAM;  Surgeon: Peter M Swaziland, MD;  Location: [facility] CATH LAB;  Service: Cardiovascular;  Laterality: N/A;        Home Medications    Prior to Admission medications   Medication Sig Start Date End Date Taking? Authorizing Provider  albuterol (PROVENTIL HFA;VENTOLIN HFA) 108 (90 Base) MCG/ACT inhaler Inhale 2 puffs into the lungs every 4 (four) hours as needed for wheezing or shortness of breath. 05/17/15   , Mayer Masker, MD  amLODipine (NORVASC) 5 MG tablet Take 5 mg by mouth daily.    [provider]  apixaban (ELIQUIS) 5 MG TABS tablet Take 1 tablet (5 mg total) by mouth 2 (two) times daily. 02/03/18   Marguerita Merles Latif, DO  atorvastatin (LIPITOR) 80 MG tablet Take 1 tablet (80 mg total) by mouth daily at 6 PM. 07/12/18   Leland Her, DO  brimonidine (ALPHAGAN) 0.15 % ophthalmic solution Place 1 drop into both eyes 2 (two) times daily.     [provider]  budesonide-formoterol (SYMBICORT) 80-4.5 MCG/ACT inhaler Inhale 2 puffs into the lungs 2 (two) times daily.    [provider]  Carboxymethylcellulose Sodium 0.25 % SOLN Place 2 drops into both eyes 4 (four) times daily as needed (for dry eyes).     [provider]  finasteride (PROSCAR) 5 MG tablet Take 5 mg by mouth daily.    [provider]  glucose blood (PRECISION PCX) test strip Use as instructed,ICD-10 E11.8 check blood sugar BID and PRN 07/22/18   Bing NeighborsHarris, Kimberly  S, FNP  hydrochlorothiazide (HYDRODIURIL) 25 MG tablet Take 25 mg by mouth daily.     [provider]  hydrOXYzine (ATARAX/VISTARIL) 50 MG tablet Take 1 tablet (50 mg total) by mouth 3 (three) times daily as needed. 07/22/18   Bing NeighborsHarris, Kimberly S, FNP  Ipratropium-Albuterol (COMBIVENT RESPIMAT) 20-100 MCG/ACT AERS respimat Inhale 1 puff into the lungs every 6 (six) hours as needed for wheezing.     [provider]  levalbuterol Pauline Aus(XOPENEX) 0.63 MG/3ML nebulizer solution Take 3 mLs (0.63 mg total) by nebulization every 6 (six) hours as needed for wheezing or shortness of breath. 02/03/18   Marguerita MerlesSheikh, Omair Latif, DO  levothyroxine (SYNTHROID, LEVOTHROID) 25 MCG tablet Take 2 tablets (50 mcg total) by mouth daily before breakfast. 06/11/18   Bing NeighborsHarris, Kimberly S, FNP  lisinopril (PRINIVIL,ZESTRIL) 40 MG tablet Take 40 mg by mouth daily.    [provider]  metFORMIN (GLUCOPHAGE) 500 MG tablet Take 1 tablet (500 mg total) by mouth 2 (two) times daily with a meal. 07/22/18   Bing NeighborsHarris, Kimberly S, FNP  metoprolol (LOPRESSOR) 50 MG tablet Take 25 mg by mouth 2 (two) times daily.    [provider]  Multiple Vitamins-Minerals (MULTIVITAMIN WITH MINERALS) tablet Take 1 tablet by mouth daily.    [provider]  nitroGLYCERIN (NITROSTAT) 0.4 MG SL tablet Place 0.4 mg under the tongue every 5 (five) minutes as needed for chest pain. Dissolve one tablet under the tongue every 5 minutes as needed for chest pain. Repeat every 5 minutes if needed for a total of 3 tablets in 15 minutes. If no relief, call 911.    [provider]  olopatadine (PATANOL) 0.1 % ophthalmic solution Place 1 drop into both eyes 2 (two) times daily. 06/05/18   Bing NeighborsHarris, Kimberly S, FNP  omeprazole (PRILOSEC) 20 MG capsule Take 20 mg by mouth daily. .    [provider]  senna-docusate (SENOKOT-S) 8.6-50 MG tablet Take 2 tablets by mouth at bedtime. 06/05/18   Bing NeighborsHarris, Kimberly S, FNP  Skin  Protectants, Misc. (EUCERIN) cream Apply 1 application topically See admin instructions. Apply small amount to affected area twice a day.    [provider]  tamsulosin (FLOMAX) 0.4 MG CAPS capsule Take 1 capsule (0.4 mg total) by mouth daily. 07/22/18   Bing NeighborsHarris, Kimberly S, FNP    Family History Family History  Problem Relation Age of Onset  . CAD Son   . Skin cancer Brother     Social History Social History   Tobacco Use  . Smoking status: Former Smoker    Types: Cigarettes  . Smokeless tobacco: Former Engineer, waterUser  Substance Use Topics  . Alcohol use: No  . Drug use: No     Allergies   Patient has no known allergies.   Review of Systems Review of Systems  Unable to perform ROS: Mental status change     Physical Exam Updated Vital Signs BP (!) 120/59   Pulse Marland Kitchen(!)  57   Temp 98.4 F (36.9 C) (Oral)   Resp 20   Ht 1.727 m ( )   Wt 72.6 kg   SpO2 99%   BMI 24.33 kg/m   Physical Exam Vitals signs and nursing note reviewed.  Constitutional:      Appearance: He is well-developed.     Comments: Early, nontoxic-appearing  HENT:     Head: Normocephalic and atraumatic.  Eyes:     Extraocular Movements: Extraocular movements intact.     Pupils: Pupils are equal, round, and reactive to light.     Comments: Bilateral conjunctival injection with drainage  Neck:     Musculoskeletal: Neck supple.  Cardiovascular:     Rate and Rhythm: Normal rate and regular rhythm.     Heart sounds: Normal heart sounds. No murmur.  Pulmonary:     Effort: Pulmonary effort is normal. No respiratory distress.     Breath sounds: Wheezing present.     Comments: Occasional wheeze Abdominal:     General: Bowel sounds are normal.     Palpations: Abdomen is soft.     Tenderness: There is no abdominal tenderness. There is no rebound.  Musculoskeletal:     Right lower leg: No edema.     Left lower leg: No edema.  Lymphadenopathy:     Cervical: No cervical adenopathy.  Skin:     General: Skin is warm and dry.  Neurological:     Mental Status: He is alert.     Comments: Oriented only to self, follows simple commands      ED Treatments / Results  Labs (all labs ordered are listed, but only abnormal results are displayed) Labs Reviewed  COMPREHENSIVE METABOLIC PANEL - Abnormal; Notable for the following components:      Result Value   Glucose, Bld 125 (*)    Creatinine, Ser 1.43 (*)    Total Protein 6.2 (*)    GFR calc non Af Amer 43 (*)    GFR calc Af Amer 50 (*)    All other components within normal limits  URINALYSIS, ROUTINE W REFLEX MICROSCOPIC - Abnormal; Notable for the following components:   Specific Gravity, Urine <1.005 (*)    All other components within normal limits  CBC WITH DIFFERENTIAL/PLATELET    EKG EKG Interpretation  Date/Time:  Wednesday July 24 2018 04:47:37 EDT Ventricular Rate:  70 PR Interval:    QRS Duration: 145 QT Interval:  418 QTC Calculation: 451 R Axis:   -85 Text Interpretation:  Sinus rhythm Ventricular trigeminy RBBB and LAFB No significant change since last tracing Confirmed by Ross Marcus ([id number]) on 07/24/2018 4:52:28 AM   Radiology No results found.  Procedures Procedures (including critical care time)  Medications Ordered in ED Medications - No data to display   Initial Impression / Assessment and Plan / ED Course  I have reviewed the triage vital signs and the nursing notes.  Pertinent labs & imaging results that were available during my care of the patient were reviewed by me and considered in my medical decision making (see chart for details).  Clinical Course as of Jul 23 725  Wed Jul 24, 2018  0606 Spoke to patient's son, Ferne Reus.  Reports that the patient seems confused and appears to be in pain when he urinates.  He is concerned he may have a UTI.  I have updated him that patient's lab work including urinalysis is all very reassuring.  Based on chart review, he appears to be at  his baseline  from last discharge.   [CH]  4287 Is ambulatory with minimal assist.  He is slightly unsteady but this appears to be consistent with his last PT evaluation.   [CH]    Clinical Course User Index [CH] , Mayer Masker, MD       Patient presents with reported confusion and son reports that he seems to be in pain with urination.  Patient is only oriented to himself.  Based on recent chart review, this appears to be his new baseline.  He otherwise has no focal signs or symptoms.  Basic lab work obtained.  No leukocytosis.  No significant metabolic derangement.  I do not feel head CT is warranted at this time.  Urinalysis without evidence of urinary tract infection.  Patient was ambulatory at his baseline.  I did speak with the son and update him.  He states that they are able to care for his father at home.  He will come and pick him up.  After history, exam, and medical workup I feel the patient has been appropriately medically screened and is safe for discharge home. Pertinent diagnoses were discussed with the patient. Patient was given return precautions.   Final Clinical Impressions(s) / ED Diagnoses   Final diagnoses:  Confusion    ED Discharge Orders    None       , Mayer Masker, MD 07/24/18 [phone]

## 2018-07-24 NOTE — Telephone Encounter (Signed)
Yes , verbals order okay'd.

## 2018-07-24 NOTE — Telephone Encounter (Signed)
Is it ok for the verbal?

## 2018-07-24 NOTE — Telephone Encounter (Addendum)
Home health order, demographics & last provider visit(07/22/18) faxed to [phone] & sent to [facility] email.

## 2018-07-24 NOTE — Telephone Encounter (Signed)
[name] called requesting nursing orders for  1x a week for 4 weeks, please follow up

## 2018-07-25 ENCOUNTER — Telehealth: Payer: Self-pay | Admitting: Family Medicine

## 2018-07-25 NOTE — Telephone Encounter (Signed)
[name] states patient had home health aid orders and the patient has declined the home aid services. Please follow up.

## 2018-07-25 NOTE — Telephone Encounter (Signed)
FYI  Patient has declined the home health aide. He feels like he is high risk & does not want anybody coming in his house.

## 2018-07-28 ENCOUNTER — Other Ambulatory Visit: Payer: Self-pay

## 2018-07-28 ENCOUNTER — Emergency Department (HOSPITAL_COMMUNITY)
Admission: EM | Admit: 2018-07-28 | Discharge: 2018-07-29 | Disposition: A | Discharge status: Home / Self Care | Payer: Medicare Other | Source: Home / Self Care | Attending: Emergency Medicine | Admitting: Emergency Medicine

## 2018-07-28 DIAGNOSIS — N3091 Cystitis, unspecified with hematuria: Secondary | ICD-10-CM | POA: Insufficient documentation

## 2018-07-28 DIAGNOSIS — R310 Gross hematuria: Secondary | ICD-10-CM

## 2018-07-28 DIAGNOSIS — E119 Type 2 diabetes mellitus without complications: Secondary | ICD-10-CM

## 2018-07-28 DIAGNOSIS — Z8673 Personal history of transient ischemic attack (TIA), and cerebral infarction without residual deficits: Secondary | ICD-10-CM | POA: Insufficient documentation

## 2018-07-28 DIAGNOSIS — I2510 Atherosclerotic heart disease of native coronary artery without angina pectoris: Secondary | ICD-10-CM

## 2018-07-28 DIAGNOSIS — I25119 Atherosclerotic heart disease of native coronary artery with unspecified angina pectoris: Secondary | ICD-10-CM | POA: Diagnosis not present

## 2018-07-28 DIAGNOSIS — I10 Essential (primary) hypertension: Secondary | ICD-10-CM

## 2018-07-28 DIAGNOSIS — Z7984 Long term (current) use of oral hypoglycemic drugs: Secondary | ICD-10-CM | POA: Insufficient documentation

## 2018-07-28 DIAGNOSIS — Z7901 Long term (current) use of anticoagulants: Secondary | ICD-10-CM | POA: Insufficient documentation

## 2018-07-28 DIAGNOSIS — Z87891 Personal history of nicotine dependence: Secondary | ICD-10-CM

## 2018-07-28 DIAGNOSIS — Z79899 Other long term (current) drug therapy: Secondary | ICD-10-CM | POA: Insufficient documentation

## 2018-07-28 DIAGNOSIS — R079 Chest pain, unspecified: Secondary | ICD-10-CM | POA: Diagnosis not present

## 2018-07-28 NOTE — ED Notes (Signed)
Bed: TA56 Expected date:  Expected time:  Means of arrival:  Comments: EMS urinary retention

## 2018-07-28 NOTE — ED Provider Notes (Addendum)
[facility]-EMERGENCY DEPT Provider Note   CSN: [id number] Arrival date & time: 07/28/18  2246    History   Chief Complaint Chief Complaint  Patient presents with  . Hematuria  . Urinary Retention    HPI [name] is a 85 y.o. male.     Patient presents to the emergency department for evaluation of hematuria.  Patient reportedly had an episode of hematuria earlier today.  He reports that there was a fair amount of blood.  He has not experiencing any pain currently.  He has not noticed any pain with urination.  Family reportedly told EMS that he has been having some bilateral flank pain but he denies this.  He is on Eliquis.     Past Medical History:  Diagnosis Date  . Atrial fibrillation (HCC)   . COPD (chronic obstructive pulmonary disease) (HCC)   . CVA (cerebral infarction)   . Diabetes mellitus without complication (HCC)   . Essential hypertension, benign   . Hard of hearing     Patient Active Problem List   Diagnosis Date Noted  . AMS (altered mental status) 07/14/2018  . Altered mental status   . Stroke (HCC) 07/10/2018  . Requires supplemental oxygen 06/05/2018  . Chest pain 03/29/2018  . Atrial fibrillation (HCC) 05/20/2015  . RBBB 05/20/2015  . Prolonged Q-T interval on ECG 05/20/2015  . CAD (coronary artery disease) 05/20/2015  . History of CVA (cerebrovascular accident) 05/20/2015  . Atrial fibrillation with RVR (HCC)   . Bronchiectasis with acute exacerbation (HCC)   . Unstable angina (HCC) 03/27/2013  . COPD exacerbation (HCC) 03/25/2013  . Diabetes mellitus type II, uncontrolled (HCC) 03/25/2013  . Hypothyroidism 03/25/2013  . BPH (benign prostatic hyperplasia) 03/25/2013  . HTN (hypertension) 03/25/2013    Past Surgical History:  Procedure Laterality Date  . HERNIA REPAIR    . LEFT HEART CATHETERIZATION WITH CORONARY ANGIOGRAM N/A 03/28/2013   Procedure: LEFT HEART CATHETERIZATION WITH CORONARY ANGIOGRAM;  Surgeon:  Peter M Swaziland, MD;  Location: [facility] CATH LAB;  Service: Cardiovascular;  Laterality: N/A;        Home Medications    Prior to Admission medications   Medication Sig Start Date End Date Taking? Authorizing Provider  albuterol (PROVENTIL HFA;VENTOLIN HFA) 108 (90 Base) MCG/ACT inhaler Inhale 2 puffs into the lungs every 4 (four) hours as needed for wheezing or shortness of breath. 05/17/15   Horton, Mayer Masker, MD  amLODipine (NORVASC) 5 MG tablet Take 5 mg by mouth daily.    [provider]  apixaban (ELIQUIS) 5 MG TABS tablet Take 1 tablet (5 mg total) by mouth 2 (two) times daily. 02/03/18   Marguerita Merles Latif, DO  atorvastatin (LIPITOR) 80 MG tablet Take 1 tablet (80 mg total) by mouth daily at 6 PM. 07/12/18   Leland Her, DO  brimonidine (ALPHAGAN) 0.15 % ophthalmic solution Place 1 drop into both eyes 2 (two) times daily.     [provider]  budesonide-formoterol (SYMBICORT) 80-4.5 MCG/ACT inhaler Inhale 2 puffs into the lungs 2 (two) times daily.    [provider]  Carboxymethylcellulose Sodium 0.25 % SOLN Place 2 drops into both eyes 4 (four) times daily as needed (for dry eyes).     [provider]  finasteride (PROSCAR) 5 MG tablet Take 5 mg by mouth daily.    [provider]  glucose blood (PRECISION PCX) test strip Use as instructed,ICD-10 E11.8 check blood sugar BID and PRN 07/22/18  Harris, Kimberly S, FNP  hydrochlorothiazide (HYDRODIURIL) 25 MG tablet Take 25 mg by mouth daily.     [provider]  hydrOXYzine (ATARAX/VISTARIL) 50 MG tablet Take 1 tablet (50 mg total) by mouth 3 (three) times daily as needed. 07/22/18   Bing Neighbors, FNP  Ipratropium-Albuterol (COMBIVENT RESPIMAT) 20-100 MCG/ACT AERS respimat Inhale 1 puff into the lungs every 6 (six) hours as needed for wheezing.     [provider]  levalbuterol Pauline Aus) 0.63 MG/3ML nebulizer solution Take 3 mLs (0.63 mg total) by nebulization every 6 (six)  hours as needed for wheezing or shortness of breath. 02/03/18   Marguerita Merles Latif, DO  levothyroxine (SYNTHROID, LEVOTHROID) 25 MCG tablet Take 2 tablets (50 mcg total) by mouth daily before breakfast. 06/11/18   Bing Neighbors, FNP  lisinopril (PRINIVIL,ZESTRIL) 40 MG tablet Take 40 mg by mouth daily.    [provider]  metFORMIN (GLUCOPHAGE) 500 MG tablet Take 1 tablet (500 mg total) by mouth 2 (two) times daily with a meal. 07/22/18   Bing Neighbors, FNP  metoprolol (LOPRESSOR) 50 MG tablet Take 25 mg by mouth 2 (two) times daily.    [provider]  Multiple Vitamins-Minerals (MULTIVITAMIN WITH MINERALS) tablet Take 1 tablet by mouth daily.    [provider]  nitroGLYCERIN (NITROSTAT) 0.4 MG SL tablet Place 0.4 mg under the tongue every 5 (five) minutes as needed for chest pain. Dissolve one tablet under the tongue every 5 minutes as needed for chest pain. Repeat every 5 minutes if needed for a total of 3 tablets in 15 minutes. If no relief, call 911.    [provider]  olopatadine (PATANOL) 0.1 % ophthalmic solution Place 1 drop into both eyes 2 (two) times daily. 06/05/18   Bing Neighbors, FNP  omeprazole (PRILOSEC) 20 MG capsule Take 20 mg by mouth daily. .    [provider]  senna-docusate (SENOKOT-S) 8.6-50 MG tablet Take 2 tablets by mouth at bedtime. 06/05/18   Bing Neighbors, FNP  Skin Protectants, Misc. (EUCERIN) cream Apply 1 application topically See admin instructions. Apply small amount to affected area twice a day.    [provider]  tamsulosin (FLOMAX) 0.4 MG CAPS capsule Take 1 capsule (0.4 mg total) by mouth daily. 07/22/18   Bing Neighbors, FNP    Family History Family History  Problem Relation Age of Onset  . CAD Son   . Skin cancer Brother     Social History Social History   Tobacco Use  . Smoking status: Former Smoker    Types: Cigarettes  . Smokeless tobacco: Former Engineer, water Use  Topics  . Alcohol use: No  . Drug use: No     Allergies   Patient has no known allergies.   Review of Systems Review of Systems  Genitourinary: Positive for hematuria.  All other systems reviewed and are negative.    Physical Exam Updated Vital Signs BP 140/68   Pulse 70   Temp 98.4 F (36.9 C) (Oral)   Resp (!) 25   SpO2 95%   Physical Exam Vitals signs and nursing note reviewed.  Constitutional:      General: He is not in acute distress.    Appearance: Normal appearance. He is well-developed.  HENT:     Head: Normocephalic and atraumatic.     Right Ear: Hearing normal.     Left Ear: Hearing normal.     Nose: NoBing Neighborse normal.  Eyes:  Conjunctiva/sclera: Conjunctivae normal.     Pupils: Pupils are equal, round, and reactive to light.  Neck:     Musculoskeletal: Normal range of motion and neck supple.  Cardiovascular:     Rate and Rhythm: Regular rhythm.     Heart sounds: S1 normal and S2 normal. No murmur. No friction rub. No gallop.   Pulmonary:     Effort: Pulmonary effort is normal. No respiratory distress.     Breath sounds: Wheezing (Slight at bases) present.  Chest:     Chest wall: No tenderness.  Abdominal:     General: Bowel sounds are normal.     Palpations: Abdomen is soft.     Tenderness: There is no abdominal tenderness. There is no guarding or rebound. Negative signs include Murphy's sign and McBurney's sign.     Hernia: No hernia is present.  Musculoskeletal: Normal range of motion.  Skin:    General: Skin is warm and dry.     Findings: No rash.  Neurological:     Mental Status: He is alert and oriented to person, place, and time.     GCS: GCS eye subscore is 4. GCS verbal subscore is 5. GCS motor subscore is 6.     Cranial Nerves: No cranial nerve deficit.     Sensory: No sensory deficit.     Coordination: Coordination normal.  Psychiatric:        Speech: Speech normal.        Behavior: Behavior normal.        Thought Content: Thought  content normal.      ED Treatments / Results  Labs (all labs ordered are listed, but only abnormal results are displayed) Labs Reviewed  CBC WITH DIFFERENTIAL/PLATELET - Abnormal; Notable for the following components:      Result Value   RBC 4.00 (*)    Hemoglobin 11.4 (*)    HCT 36.8 (*)    All other components within normal limits  BASIC METABOLIC PANEL - Abnormal; Notable for the following components:   Glucose, Bld 170 (*)    GFR calc non Af Amer 53 (*)    All other components within normal limits  URINALYSIS, ROUTINE W REFLEX MICROSCOPIC - Abnormal; Notable for the following components:   APPearance HAZY (*)    Hgb urine dipstick LARGE (*)    Protein, ur 30 (*)    Leukocytes,Ua SMALL (*)    RBC / HPF >50 (*)    All other components within normal limits  URINE CULTURE    EKG None  Radiology No results found.  Procedures Procedures (including critical care time)  Medications Ordered in ED Medications  cefTRIAXone (ROCEPHIN) 1 g in sodium chloride 0.9 % 100 mL IVPB (has no administration in time range)     Initial Impression / Assessment and Plan / ED Course  I have reviewed the triage vital signs and the nursing notes.  Pertinent labs & imaging results that were available during my care of the patient were reviewed by me and considered in my medical decision making (see chart for details).        Patient presents to the emergency department for evaluation of hematuria.  Patient was seen in the ER several days ago with complaints of dysuria.  At that time his urinalysis did not suggest infection.  Etiology of his hematuria is unclear at this time.  Is likely multifactorial.  Most likely cause is Eliquis use secondary to atrial fibrillation.  As he has had  urinary symptoms, despite an equivocal urinalysis, will initiate treatment.  Patient is not exhibiting any discomfort at this time.  No signs of significant anemia.  No urinary retention, bladder scan showed  less than 200 mL's of urine at time of arrival.  He was able to urinate here without difficulty.  Patient will not require hospitalization at this time.  Final Clinical Impressions(s) / ED Diagnoses   Final diagnoses:  Gross hematuria    ED Discharge Orders    None       Ayeden Gladman, Canary Brimhristopher J, MD 07/29/18 [id number]    Gilda CreasePollina, Jamyah Folk J, MD 07/29/18 [phone]

## 2018-07-28 NOTE — ED Triage Notes (Addendum)
Pt arriving via [facility] from home for urinary retention and hematuria. Recent UTI with antibiotic tx which has finished. Bilat flank pain x2 weeks. Recent stroke with right side deficits. Pt on Elequis.

## 2018-07-29 LAB — CBC WITH DIFFERENTIAL/PLATELET
Abs Immature Granulocytes: 0.06 10*3/uL (ref 0.00–0.07)
Basophils Absolute: 0.1 10*3/uL (ref 0.0–0.1)
Basophils Relative: 1 %
Eosinophils Absolute: 0.4 10*3/uL (ref 0.0–0.5)
Eosinophils Relative: 5 %
HCT: 36.8 % — ABNORMAL LOW (ref 39.0–52.0)
Hemoglobin: 11.4 g/dL — ABNORMAL LOW (ref 13.0–17.0)
Immature Granulocytes: 1 %
Lymphocytes Relative: 17 %
Lymphs Abs: 1.2 10*3/uL (ref 0.7–4.0)
MCH: 28.5 pg (ref 26.0–34.0)
MCHC: 31 g/dL (ref 30.0–36.0)
MCV: 92 fL (ref 80.0–100.0)
Monocytes Absolute: 0.8 10*3/uL (ref 0.1–1.0)
Monocytes Relative: 10 %
Neutro Abs: 4.7 10*3/uL (ref 1.7–7.7)
Neutrophils Relative %: 66 %
Platelets: 204 10*3/uL (ref 150–400)
RBC: 4 MIL/uL — ABNORMAL LOW (ref 4.22–5.81)
RDW: 13.8 % (ref 11.5–15.5)
WBC: 7.2 10*3/uL (ref 4.0–10.5)
nRBC: 0 % (ref 0.0–0.2)

## 2018-07-29 LAB — BASIC METABOLIC PANEL
Anion gap: 7 (ref 5–15)
BUN: 23 mg/dL (ref 8–23)
CO2: 24 mmol/L (ref 22–32)
Calcium: 9.1 mg/dL (ref 8.9–10.3)
Chloride: 109 mmol/L (ref 98–111)
Creatinine, Ser: 1.21 mg/dL (ref 0.61–1.24)
GFR calc Af Amer: >60 mL/min (ref >60)
GFR calc non Af Amer: 53 mL/min — ABNORMAL LOW (ref >60)
Glucose, Bld: 170 mg/dL — ABNORMAL HIGH (ref 70–99)
Potassium: 4 mmol/L (ref 3.5–5.1)
Sodium: 140 mmol/L (ref 135–145)

## 2018-07-29 LAB — URINALYSIS, ROUTINE W REFLEX MICROSCOPIC
Bacteria, UA: NONE SEEN
Bilirubin Urine: NEGATIVE
Glucose, UA: NEGATIVE mg/dL
Ketones, ur: NEGATIVE mg/dL
Nitrite: NEGATIVE
Protein, ur: 30 mg/dL — AB
RBC / HPF: >50 RBC/hpf — ABNORMAL HIGH (ref 0–5)
Specific Gravity, Urine: 1.01 (ref 1.005–1.030)
pH: 6 (ref 5.0–8.0)

## 2018-07-29 MED ORDER — CEPHALEXIN 500 MG PO CAPS: 500.0000 mg | ORAL_CAPSULE | Freq: Two times a day (BID) | ORAL | 0 refills | Status: DC

## 2018-07-29 MED ORDER — ALBUTEROL SULFATE HFA 108 (90 BASE) MCG/ACT IN AERS
2.0000 | INHALATION_SPRAY | RESPIRATORY_TRACT | Status: AC
  Administered 2018-07-29: 2 via RESPIRATORY_TRACT
  Filled 2018-07-29: qty 6.7

## 2018-07-29 MED ORDER — SODIUM CHLORIDE 0.9 % IV SOLN
1.0000 g | Freq: Once | INTRAVENOUS | Status: AC
  Administered 2018-07-29: 1 g via INTRAVENOUS
  Filled 2018-07-29: qty 10

## 2018-07-29 NOTE — ED Notes (Signed)
Message left for family members to pick pt up.

## 2018-07-29 NOTE — ED Notes (Signed)
Pts family member is leaving in just a few minutes to pick pt up. Will take them 30-40 minutes to get here.

## 2018-07-29 NOTE — ED Notes (Signed)
Patients family member was given discharge teaching and instructions outside of ED due to coronavirus restrictions. Family member was unable to sign for paperwork due to inability to sign signature pad inside ED. This Clinical research associate took patient outside of ED w/ wheelchair and patient belongings was given to family member.

## 2018-07-29 NOTE — ED Notes (Signed)
Urine culture sent to the lab. 

## 2018-07-29 NOTE — ED Notes (Addendum)
Please call [phone] Audie Shakespear (son) or daughter in law, they will pick up when discharged.

## 2018-07-31 ENCOUNTER — Encounter (HOSPITAL_COMMUNITY): Payer: Self-pay | Admitting: Emergency Medicine

## 2018-07-31 ENCOUNTER — Inpatient Hospital Stay (HOSPITAL_COMMUNITY)
Admission: EM | Admit: 2018-07-31 | Discharge: 2018-08-01 | DRG: 303 | Disposition: A | Discharge status: Skilled Nursing Facility | Payer: Medicare Other | Attending: Family Medicine | Admitting: Family Medicine

## 2018-07-31 ENCOUNTER — Emergency Department (HOSPITAL_COMMUNITY): Payer: Medicare Other

## 2018-07-31 ENCOUNTER — Institutional Professional Consult (permissible substitution): Payer: Medicare Other | Admitting: Internal Medicine

## 2018-07-31 ENCOUNTER — Other Ambulatory Visit: Payer: Self-pay

## 2018-07-31 DIAGNOSIS — D649 Anemia, unspecified: Secondary | ICD-10-CM | POA: Diagnosis present

## 2018-07-31 DIAGNOSIS — E440 Moderate protein-calorie malnutrition: Secondary | ICD-10-CM

## 2018-07-31 DIAGNOSIS — Z1621 Resistance to vancomycin: Secondary | ICD-10-CM | POA: Diagnosis present

## 2018-07-31 DIAGNOSIS — Z7984 Long term (current) use of oral hypoglycemic drugs: Secondary | ICD-10-CM | POA: Diagnosis not present

## 2018-07-31 DIAGNOSIS — Z808 Family history of malignant neoplasm of other organs or systems: Secondary | ICD-10-CM

## 2018-07-31 DIAGNOSIS — E785 Hyperlipidemia, unspecified: Secondary | ICD-10-CM | POA: Diagnosis present

## 2018-07-31 DIAGNOSIS — Z6824 Body mass index (BMI) 24.0-24.9, adult: Secondary | ICD-10-CM

## 2018-07-31 DIAGNOSIS — N400 Enlarged prostate without lower urinary tract symptoms: Secondary | ICD-10-CM | POA: Diagnosis present

## 2018-07-31 DIAGNOSIS — I2510 Atherosclerotic heart disease of native coronary artery without angina pectoris: Secondary | ICD-10-CM | POA: Diagnosis present

## 2018-07-31 DIAGNOSIS — I480 Paroxysmal atrial fibrillation: Secondary | ICD-10-CM | POA: Diagnosis present

## 2018-07-31 DIAGNOSIS — Z7951 Long term (current) use of inhaled steroids: Secondary | ICD-10-CM | POA: Diagnosis not present

## 2018-07-31 DIAGNOSIS — R310 Gross hematuria: Secondary | ICD-10-CM | POA: Diagnosis present

## 2018-07-31 DIAGNOSIS — Z87891 Personal history of nicotine dependence: Secondary | ICD-10-CM

## 2018-07-31 DIAGNOSIS — Z8249 Family history of ischemic heart disease and other diseases of the circulatory system: Secondary | ICD-10-CM

## 2018-07-31 DIAGNOSIS — Z79890 Hormone replacement therapy (postmenopausal): Secondary | ICD-10-CM

## 2018-07-31 DIAGNOSIS — Z8673 Personal history of transient ischemic attack (TIA), and cerebral infarction without residual deficits: Secondary | ICD-10-CM

## 2018-07-31 DIAGNOSIS — I10 Essential (primary) hypertension: Secondary | ICD-10-CM | POA: Diagnosis present

## 2018-07-31 DIAGNOSIS — H9190 Unspecified hearing loss, unspecified ear: Secondary | ICD-10-CM | POA: Diagnosis present

## 2018-07-31 DIAGNOSIS — B952 Enterococcus as the cause of diseases classified elsewhere: Secondary | ICD-10-CM | POA: Diagnosis present

## 2018-07-31 DIAGNOSIS — J449 Chronic obstructive pulmonary disease, unspecified: Secondary | ICD-10-CM | POA: Diagnosis present

## 2018-07-31 DIAGNOSIS — I639 Cerebral infarction, unspecified: Secondary | ICD-10-CM | POA: Diagnosis present

## 2018-07-31 DIAGNOSIS — H409 Unspecified glaucoma: Secondary | ICD-10-CM | POA: Diagnosis present

## 2018-07-31 DIAGNOSIS — Z1159 Encounter for screening for other viral diseases: Secondary | ICD-10-CM

## 2018-07-31 DIAGNOSIS — Z7901 Long term (current) use of anticoagulants: Secondary | ICD-10-CM | POA: Diagnosis not present

## 2018-07-31 DIAGNOSIS — Z79899 Other long term (current) drug therapy: Secondary | ICD-10-CM

## 2018-07-31 DIAGNOSIS — H5789 Other specified disorders of eye and adnexa: Secondary | ICD-10-CM | POA: Diagnosis present

## 2018-07-31 DIAGNOSIS — I452 Bifascicular block: Secondary | ICD-10-CM | POA: Diagnosis present

## 2018-07-31 DIAGNOSIS — E039 Hypothyroidism, unspecified: Secondary | ICD-10-CM | POA: Diagnosis present

## 2018-07-31 DIAGNOSIS — I4891 Unspecified atrial fibrillation: Secondary | ICD-10-CM | POA: Diagnosis present

## 2018-07-31 DIAGNOSIS — E119 Type 2 diabetes mellitus without complications: Secondary | ICD-10-CM | POA: Diagnosis present

## 2018-07-31 DIAGNOSIS — N390 Urinary tract infection, site not specified: Secondary | ICD-10-CM | POA: Diagnosis present

## 2018-07-31 DIAGNOSIS — R319 Hematuria, unspecified: Secondary | ICD-10-CM

## 2018-07-31 DIAGNOSIS — I25119 Atherosclerotic heart disease of native coronary artery with unspecified angina pectoris: Principal | ICD-10-CM | POA: Diagnosis present

## 2018-07-31 DIAGNOSIS — Z66 Do not resuscitate: Secondary | ICD-10-CM | POA: Diagnosis present

## 2018-07-31 DIAGNOSIS — I4510 Unspecified right bundle-branch block: Secondary | ICD-10-CM | POA: Diagnosis present

## 2018-07-31 DIAGNOSIS — R079 Chest pain, unspecified: Secondary | ICD-10-CM

## 2018-07-31 DIAGNOSIS — I6300 Cerebral infarction due to thrombosis of unspecified precerebral artery: Secondary | ICD-10-CM | POA: Diagnosis not present

## 2018-07-31 LAB — SARS CORONAVIRUS 2 BY RT PCR (HOSPITAL ORDER, PERFORMED IN ~~LOC~~ HOSPITAL LAB): SARS Coronavirus 2: NEGATIVE

## 2018-07-31 LAB — CBC WITH DIFFERENTIAL/PLATELET
Abs Immature Granulocytes: 0.03 10*3/uL (ref 0.00–0.07)
Basophils Absolute: 0.1 10*3/uL (ref 0.0–0.1)
Basophils Relative: 1 %
Eosinophils Absolute: 0.4 10*3/uL (ref 0.0–0.5)
Eosinophils Relative: 6 %
HCT: 37 % — ABNORMAL LOW (ref 39.0–52.0)
Hemoglobin: 11.8 g/dL — ABNORMAL LOW (ref 13.0–17.0)
Immature Granulocytes: 1 %
Lymphocytes Relative: 17 %
Lymphs Abs: 1.1 10*3/uL (ref 0.7–4.0)
MCH: 28.8 pg (ref 26.0–34.0)
MCHC: 31.9 g/dL (ref 30.0–36.0)
MCV: 90.2 fL (ref 80.0–100.0)
Monocytes Absolute: 0.7 10*3/uL (ref 0.1–1.0)
Monocytes Relative: 11 %
Neutro Abs: 4.1 10*3/uL (ref 1.7–7.7)
Neutrophils Relative %: 64 %
Platelets: 209 10*3/uL (ref 150–400)
RBC: 4.1 MIL/uL — ABNORMAL LOW (ref 4.22–5.81)
RDW: 13.4 % (ref 11.5–15.5)
WBC: 6.4 10*3/uL (ref 4.0–10.5)
nRBC: 0 % (ref 0.0–0.2)

## 2018-07-31 LAB — COMPREHENSIVE METABOLIC PANEL
ALT: 15 U/L (ref 0–44)
AST: 15 U/L (ref 15–41)
Albumin: 3.1 g/dL — ABNORMAL LOW (ref 3.5–5.0)
Alkaline Phosphatase: 43 U/L (ref 38–126)
Anion gap: 8 (ref 5–15)
BUN: 21 mg/dL (ref 8–23)
CO2: 22 mmol/L (ref 22–32)
Calcium: 9.3 mg/dL (ref 8.9–10.3)
Chloride: 110 mmol/L (ref 98–111)
Creatinine, Ser: 1.23 mg/dL (ref 0.61–1.24)
GFR calc Af Amer: >60 mL/min (ref >60)
GFR calc non Af Amer: 52 mL/min — ABNORMAL LOW (ref >60)
Glucose, Bld: 168 mg/dL — ABNORMAL HIGH (ref 70–99)
Potassium: 4.1 mmol/L (ref 3.5–5.1)
Sodium: 140 mmol/L (ref 135–145)
Total Bilirubin: 0.6 mg/dL (ref 0.3–1.2)
Total Protein: 5.6 g/dL — ABNORMAL LOW (ref 6.5–8.1)

## 2018-07-31 LAB — URINE CULTURE: Culture: >=100000 — AB

## 2018-07-31 LAB — GLUCOSE, CAPILLARY
Glucose-Capillary: 215 mg/dL — ABNORMAL HIGH (ref 70–99)
Glucose-Capillary: 172 mg/dL — ABNORMAL HIGH (ref 70–99)

## 2018-07-31 LAB — TROPONIN I
Troponin I: <0.03 ng/mL (ref <0.03)
Troponin I: <0.03 ng/mL (ref <0.03)
Troponin I: <0.03 ng/mL (ref <0.03)
Troponin I: <0.03 ng/mL (ref <0.03)

## 2018-07-31 MED ORDER — SENNOSIDES-DOCUSATE SODIUM 8.6-50 MG PO TABS
2.0000 | ORAL_TABLET | Freq: Every day | ORAL | Status: DC
  Administered 2018-07-31: 2 via ORAL
  Filled 2018-07-31: qty 2

## 2018-07-31 MED ORDER — ASPIRIN 81 MG PO CHEW: 324.0000 mg | CHEWABLE_TABLET | Freq: Once | ORAL | Status: DC

## 2018-07-31 MED ORDER — OLOPATADINE HCL 0.1 % OP SOLN
1.0000 [drp] | Freq: Two times a day (BID) | OPHTHALMIC | Status: DC
  Administered 2018-07-31 – 2018-08-01 (×3): 1 [drp] via OPHTHALMIC
  Filled 2018-07-31: qty 5

## 2018-07-31 MED ORDER — LEVOTHYROXINE SODIUM 50 MCG PO TABS
50.0000 ug | ORAL_TABLET | Freq: Every day | ORAL | Status: DC
  Administered 2018-07-31 – 2018-08-01 (×2): 50 ug via ORAL
  Filled 2018-07-31 (×2): qty 1

## 2018-07-31 MED ORDER — HYDROCHLOROTHIAZIDE 25 MG PO TABS
25.0000 mg | ORAL_TABLET | Freq: Every day | ORAL | Status: DC
  Administered 2018-07-31 – 2018-08-01 (×2): 25 mg via ORAL
  Filled 2018-07-31 (×2): qty 1

## 2018-07-31 MED ORDER — ALBUTEROL SULFATE (2.5 MG/3ML) 0.083% IN NEBU: 3.0000 mL | INHALATION_SOLUTION | RESPIRATORY_TRACT | Status: DC | PRN

## 2018-07-31 MED ORDER — FINASTERIDE 5 MG PO TABS
5.0000 mg | ORAL_TABLET | Freq: Every day | ORAL | Status: DC
  Administered 2018-07-31 – 2018-08-01 (×2): 5 mg via ORAL
  Filled 2018-07-31 (×2): qty 1

## 2018-07-31 MED ORDER — INSULIN ASPART 100 UNIT/ML ~~LOC~~ SOLN
0.0000 [IU] | Freq: Three times a day (TID) | SUBCUTANEOUS | Status: DC
  Administered 2018-07-31: 3 [IU] via SUBCUTANEOUS

## 2018-07-31 MED ORDER — LISINOPRIL 40 MG PO TABS
40.0000 mg | ORAL_TABLET | Freq: Every day | ORAL | Status: DC
  Administered 2018-07-31 – 2018-08-01 (×2): 40 mg via ORAL
  Filled 2018-07-31 (×2): qty 1

## 2018-07-31 MED ORDER — ENSURE ENLIVE PO LIQD
237.0000 mL | Freq: Two times a day (BID) | ORAL | Status: DC
  Administered 2018-07-31 – 2018-08-01 (×2): 237 mL via ORAL

## 2018-07-31 MED ORDER — METOPROLOL TARTRATE 25 MG PO TABS
25.0000 mg | ORAL_TABLET | Freq: Two times a day (BID) | ORAL | Status: DC
  Administered 2018-07-31 – 2018-08-01 (×3): 25 mg via ORAL
  Filled 2018-07-31 (×3): qty 1

## 2018-07-31 MED ORDER — APIXABAN 5 MG PO TABS
5.0000 mg | ORAL_TABLET | Freq: Two times a day (BID) | ORAL | Status: DC
  Administered 2018-07-31 – 2018-08-01 (×3): 5 mg via ORAL
  Filled 2018-07-31 (×3): qty 1

## 2018-07-31 MED ORDER — AMLODIPINE BESYLATE 2.5 MG PO TABS
5.0000 mg | ORAL_TABLET | Freq: Every day | ORAL | Status: DC
  Administered 2018-07-31 – 2018-08-01 (×2): 5 mg via ORAL
  Filled 2018-07-31 (×2): qty 2

## 2018-07-31 MED ORDER — ACETAMINOPHEN 325 MG PO TABS: 650.0000 mg | ORAL_TABLET | ORAL | Status: DC | PRN

## 2018-07-31 MED ORDER — ADULT MULTIVITAMIN W/MINERALS CH
1.0000 | ORAL_TABLET | Freq: Every day | ORAL | Status: DC
  Administered 2018-07-31 – 2018-08-01 (×2): 1 via ORAL
  Filled 2018-07-31 (×2): qty 1

## 2018-07-31 MED ORDER — ATORVASTATIN CALCIUM 80 MG PO TABS
80.0000 mg | ORAL_TABLET | Freq: Every day | ORAL | Status: DC
  Administered 2018-07-31: 80 mg via ORAL
  Filled 2018-07-31: qty 1

## 2018-07-31 MED ORDER — BRIMONIDINE TARTRATE 0.15 % OP SOLN
1.0000 [drp] | Freq: Two times a day (BID) | OPHTHALMIC | Status: DC
  Administered 2018-07-31 – 2018-08-01 (×3): 1 [drp] via OPHTHALMIC
  Filled 2018-07-31: qty 5

## 2018-07-31 MED ORDER — TAMSULOSIN HCL 0.4 MG PO CAPS
0.4000 mg | ORAL_CAPSULE | Freq: Every day | ORAL | Status: DC
  Administered 2018-07-31 – 2018-08-01 (×2): 0.4 mg via ORAL
  Filled 2018-07-31 (×2): qty 1

## 2018-07-31 MED ORDER — MOMETASONE FURO-FORMOTEROL FUM 100-5 MCG/ACT IN AERO
2.0000 | INHALATION_SPRAY | Freq: Two times a day (BID) | RESPIRATORY_TRACT | Status: DC
  Administered 2018-07-31 – 2018-08-01 (×2): 2 via RESPIRATORY_TRACT
  Filled 2018-07-31: qty 8.8

## 2018-07-31 MED ORDER — PANTOPRAZOLE SODIUM 40 MG PO TBEC
40.0000 mg | DELAYED_RELEASE_TABLET | Freq: Every day | ORAL | Status: DC
  Administered 2018-07-31 – 2018-08-01 (×2): 40 mg via ORAL
  Filled 2018-07-31 (×2): qty 1

## 2018-07-31 MED ORDER — POLYVINYL ALCOHOL 1.4 % OP SOLN: 2.0000 [drp] | Freq: Four times a day (QID) | OPHTHALMIC | Status: DC | PRN

## 2018-07-31 NOTE — H&P (Addendum)
Family Medicine [facility] Admission History and Physical Service Pager: [phone]  Patient name: [name] Medical record number: [id number] Date of birth: [birth year] Age: 85 y.o. Gender: male  Primary Care Provider: Bing Neighbors, FNP Consultants: None Code Status: DNR Preferred Emergency Contact: Ron, son  Chief Complaint: chest pain  Assessment and Plan: [name] is a 85 y.o. male presenting with chest pain. PMH is significant for prior episodes of AMS, COPD, T2DM, atrial fibrillation on Eliquis, HTN, CAD, h/o recent and remote CVA's.  Chest Pain Admit for ACS rule out. Hx known CAD.  Last Echo 07/10/2018, EF >65%, mild LVH, impaired relaxation.  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  Troponin Negative.  VSS.  CXR without active disease.  Heart score 4, given age and risk factors.  No longer endorsing chest pain.  Wells' Score 0, therefore doubt PE, patient not tachycardiac, tachypneic, and on RA.  No evidence of pneumonia or CHF on CXR.  Patient is afebrile, WBC WNL, and rapid COVID negative.  Reassuring as patient no longer having pain and did not have associated diaphoresis, dyspnea, nausea on presentation.  Lipid panel most recently with LDL 150 on 4/16 and patient on high intensity statin, therefore would not repeat Lipid Panel as will not change management at this time.  Per ED provider report, family noting that they cannot care for patient at home.  Patient had recent bounce back to hospital for a similar situation in which it was determined her qualified for SNF, although family then declined and took patient home with [facility].  Has had 4 ED visits since most recent discharge on 4/21.  Will admit for ACS rule out, control of chest pain, Physical Therapy and likely SNF placement. - admit to inpatient, Dr. 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  A&O to self and place, speaking clearly, grossly intact on exam.  Remembers that he had a recent stroke, but is a poor historian.  On high intensity statin and Eliquis for A Fib. - cont atorvastatin - cont home Eliquis  Recently Diagnosed UTI Patient seen in ED on 5/3 for hematuria.  UA at that time with large Hgb on dipstick, small leukocutes, patient was afebrile and not complaining of urinary symptoms aside from hematuria.  Was placed on Keflex.  Patient continues to not endorse urinary symptoms, although is a poor historian.  Doubt true UTI, hematuria was likely 2/2 Eliquis use. Consider urology consult for cystoscopy as outpt. He has had hematuria in the past most recently 01/2018 with normal UAs since then.  - consider repeat UA - hold Keflex, as doubt UTI - monitor Hgb  Protein Calorie Malnutrition Albumin 3.1, down from 3.8 1 week ago. - Nutrition consult  Bilateral [facility] Irrit[facility] - cont home olopatadine, brimonidine, and art tears  Afib On Eliquis, Metoprolol at home.  HR 70 on admission.  EKG with RBBB, LAFB, ventricular trigemany, unchanged from previous. - cont home eliquis - cont home metoprolol  DM A1c 7.9 on 4/15.  On metformin at home. - sSSI - CBGs AC/HS  HLD LDL 150 on 07/10/18. On Atorvastatin 80mg  at home. - cont home atorvastatin  HTN BP 136/57 on admission.  On Lisinopril, HCTZ, Norvasc at home.  Cr at 1.23, which appears to be around new baseline since 4/19. - cont home lisinopril, HCTZ, norvasc  COPD No wheezing.  On RA on exam.  Symbicort at home. - albuterol prn - dulera while inpatient  Hypothyroidism TSH 2.57 on 4/16.  On synthroid 50 mcg QD. - cont home synthroid  Normocytic Anemia Hgb 11.8, MCV 90.2.  VSS, no sign of active bleed.  Baseline appears around  12. - consider outpatient workup  BPH On finasteride and proscar at home. - cont home meds  FEN/GI: Carb Modified Prophylaxis: Eliquis  Disposition: admit to cardiac telemetry, pending PT/OT recs  History of Present Illness:  [name] is a 85 y.o. male presenting with chest pain.  Patient is a poor historian, but states that he was having chest pain earlier in the day and currently denies any pain.  He is unsure how long it lasted.  Denies associated SOB, nausea, or diaphoresis.  Patient denies any other complaints.  Patient had a recent stroke 4/15 and since that time was readmitted 4/19 for concern for AMS and ability for family to care for patient.  Patient was ultimately discharged home with [facility] per family wishes.  Per ED provider, family is again concerned with their ability to care for the patient at home.  He has had 4 ED visits since discharge on 4/21.  In ED, patient had reported chest pain prior to arrival.  Initial EKG without acute changes, initial troponin negative, and stable vitals.    History is limited as patient is a poor historian.  Review Of Systems: Per HPI with the following additions:   Review of Systems  Constitutional: Negative for diaphoresis and fever.  HENT: Negative for congestion.   Respiratory: Negative for cough, sputum production and shortness of breath.   Cardiovascular: Positive for chest pain. Negative for orthopnea and leg swelling.  Gastrointestinal: Negative for abdominal pain, diarrhea, nausea and vomiting.  Genitourinary: Negative for dysuria and frequency.  Neurological: Negative for sensory change, speech change, focal weakness and headaches.    Patient Active Problem List   Diagnosis Date Noted  . AMS (altered mental status) 07/14/2018  . Altered mental status   . Stroke (HCC) 07/10/2018  . Requires supplemental oxygen 06/05/2018  . Chest pain 03/29/2018  . Atrial fibrillation (HCC) 05/20/2015  . RBBB 05/20/2015  .  Prolonged Q-T interval on ECG 05/20/2015  . CAD (coronary artery disease) 05/20/2015  . History of CVA (cerebrovascular accident) 05/20/2015  . Atrial fibrillation with RVR (HCC)   . Bronchiectasis with acute exacerbation (HCC)   . Unstable angina (HCC) 03/27/2013  . COPD exacerbation (HCC) 03/25/2013  . Diabetes mellitus type II, uncontrolled (HCC) 03/25/2013  . Hypothyroidism 03/25/2013  . BPH (benign prostatic hyperplasia) 03/25/2013  . HTN (hypertension) 03/25/2013    Past Medical History: Past Medical History:  Diagnosis Date  . Atrial fibrillation (HCC)   . COPD (chronic obstructive pulmonary disease) (HCC)   . CVA (cerebral infarction)   . Diabetes mellitus without complication (HCC)   . Essential hypertension, benign   . Hard of hearing     Past Surgical History: Past Surgical History:  Procedure Laterality Date  . HERNIA REPAIR    . LEFT HEART CATHETERIZATION WITH CORONARY ANGIOGRAM N/A 03/28/2013   Procedure: LEFT HEART CATHETERIZATION WITH CORONARY ANGIOGRAM;  Surgeon: Peter M Swaziland, MD;  [facility] C[facility] LAB;  Service: Cardiovascular;  Laterality: N/A;    Social History: Social History  Tobacco Use  . Smoking status: Former Smoker    Types: Cigarettes  . Smokeless tobacco: Former Engineer, water Use Topics  . Alcohol use: No  . Drug use: No   Additional social history:   Please also refer to relevant sections of EMR.  Family History: Family History  Problem Relation Age of Onset  . CAD Son   . Skin cancer Brother     Allergies and Medications: No Known Allergies No current facility-administered medications on file prior to encounter.    Current Outpatient Medications on File Prior to Encounter  Medication Sig Dispense Refill  . albuterol (PROVENTIL HFA;VENTOLIN HFA) 108 (90 Base) MCG/ACT inhaler Inhale 2 puffs into the lungs every 4 (four) hours as needed for wheezing or shortness of breath. 1 Inhaler 0  . amLODipine (NORVASC) 5 MG tablet Take  5 mg by mouth daily.    Marland Kitchen apixaban (ELIQUIS) 5 MG TABS tablet Take 1 tablet (5 mg total) by mouth 2 (two) times daily. 60 tablet 0  . atorvastatin (LIPITOR) 80 MG tablet Take 1 tablet (80 mg total) by mouth daily at 6 PM. 30 tablet 0  . brimonidine (ALPHAGAN) 0.15 % ophthalmic solution Place 1 drop into both eyes 2 (two) times daily.     . budesonide-formoterol (SYMBICORT) 80-4.5 MCG/ACT inhaler Inhale 2 puffs into the lungs 2 (two) times daily.    . Carboxymethylcellulose Sodium 0.25 % SOLN Place 2 drops into both eyes 4 (four) times daily as needed (for dry eyes).     . cephALEXin (KEFLEX) 500 MG capsule Take 1 capsule (500 mg total) by mouth 2 (two) times daily. 14 capsule 0  . finasteride (PROSCAR) 5 MG tablet Take 5 mg by mouth daily.    Marland Kitchen glucose blood (PRECISION PCX) test strip Use as instructed,ICD-10 E11.8 check blood sugar BID and PRN 100 each 12  . hydrochlorothiazide (HYDRODIURIL) 25 MG tablet Take 25 mg by mouth daily.     . hydrOXYzine (ATARAX/VISTARIL) 50 MG tablet Take 1 tablet (50 mg total) by mouth 3 (three) times daily as needed. 30 tablet 0  . Ipratropium-Albuterol (COMBIVENT RESPIMAT) 20-100 MCG/ACT AERS respimat Inhale 1 puff into the lungs every 6 (six) hours as needed for wheezing.     . levalbuterol (XOPENEX) 0.63 MG/3ML nebulizer solution Take 3 mLs (0.63 mg total) by nebulization every 6 (six) hours as needed for wheezing or shortness of breath. 3 mL 12  . levothyroxine (SYNTHROID, LEVOTHROID) 25 MCG tablet Take 2 tablets (50 mcg total) by mouth daily before breakfast. 60 tablet 3  . lisinopril (PRINIVIL,ZESTRIL) 40 MG tablet Take 40 mg by mouth daily.    . metFORMIN (GLUCOPHAGE) 500 MG tablet Take 1 tablet (500 mg total) by mouth 2 (two) times daily with a meal. 60 tablet 3  . metoprolol (LOPRESSOR) 50 MG tablet Take 25 mg by mouth 2 (two) times daily.    . Multiple Vitamins-Minerals (MULTIVITAMIN WITH MINERALS) tablet Take 1 tablet by mouth daily.    . nitroGLYCERIN  (NITROSTAT) 0.4 MG SL tablet Place 0.4 mg under the tongue every 5 (five) minutes as needed for chest pain. Dissolve one tablet under the tongue every 5 minutes as needed for chest pain. Repeat every 5 minutes if needed for a total of 3 tablets in 15 minutes. If no relief, call 911.    . olopatadine (PATANOL) 0.1 % ophthalmic solution Place 1 drop into both eyes 2 (two) times daily. 5 mL 12  . omeprazole (PRILOSEC) 20 MG  capsule Take 20 mg by mouth daily. .    . senna-docusate (SENOKOT-S) 8.6-50 MG tablet Take 2 tablets by mouth at bedtime. 60 tablet 3  . Skin Protectants, Misc. (EUCERIN) cream Apply 1 application topically See admin instructions. Apply small amount to affected area twice a day.    . tamsulosin (FLOMAX) 0.4 MG CAPS capsule Take 1 capsule (0.4 mg total) by mouth daily. 30 capsule 0    Objective: BP (!) 153/56   Pulse 67   Temp 98.7 F (37.1 C) (Oral)   Resp (!) 21   Ht 5\' 8"  (1.727 m)   Wt 72.6 kg   SpO2 94%   BMI 24.33 kg/m   Physical Exam:  General: 85 y.o. male in NAD, lying in bed HEENT: NCAT, MMM Cardio: irregularly irregular rhythm, distant heart sounds Lungs: CTAB, no wheezing, no rhonchi, no crackles, no IWOB on RA Abdomen: Soft, non-tender to palpation, non-distended, positive bowel sounds Skin: warm and dry Extremities: No edema, 5/5 strength BUE/BLE Neuro: waxing/waning orientation, initially A&Ox2, to self and place only, CN II-XII grossly intact, sensation intact throughout, by late entry attestation only oriented to self    Labs and Imaging: CBC BMET  Recent Labs  Lab 07/31/18 0210  WBC 6.4  HGB 11.8*  HCT 37.0*  PLT 209   Recent Labs  Lab 07/28/18 2338  NA 140  K 4.0  CL 109  CO2 24  BUN 23  CREATININE 1.21  GLUCOSE 170*  CALCIUM 9.1     Dg Chest Port 1 View  Result Date: 07/31/2018 CLINICAL DATA:  Chest pain EXAM: PORTABLE CHEST 1 VIEW COMPARISON:  07/19/2018 FINDINGS: Heart is upper limits normal is size. Bibasilar linear  densities, likely scarring. No acute confluent opacities or effusions. No acute bony abnormality. IMPRESSION: Chronic changes.  No active disease. Electronically Signed   By: Charlett NoseKevin  Dover M.D.   On: 07/31/2018 01:55   EKG RBBB and LAFB unchanged from 4/29  Unknown JimMeccariello, Balsam J, DO 07/31/2018, 3:20 AM PGY-1, [facility] FPTS Intern pager: [phone], text pages welcome  I have separately seen and examined the patient. I have discussed the findings and exam with Dr. Obie DredgeMeccariello and agree with the above note.  My changes/additions are outlined in BLUE.   Howard PouchLauren Zorian Gunderman, MD PGY-3 [facility]

## 2018-07-31 NOTE — ED Triage Notes (Signed)
BIB [facility] from home with c/o of CP that woke pt up out of sleep. Pt took 1 nitro and 324 asp with complete relief. No pain on arrival. Pt seen here multiple times recently.

## 2018-07-31 NOTE — NC FL2 (Signed)
[place] MEDICAID FL2 LEVEL OF CARE SCREENING TOOL     IDENTIFICATION  Patient Name: [name] Birthdate: 05/09/1927 Sex: male Admission Date (Current Location): 07/31/2018  [facility] [place]   Facility and Address:  [facility], [address], [place], [place] [address]      Provider Number: [id number]  Attending Physician Name and Address:  Carney Livinghambliss, Marshall L, MD  Relative Name and Phone Number:       Current Level of Care: Hospital Recommended Level of Care: Skilled Nursing Facility Prior Approval Number:    Date Approved/Denied:   PASRR Number: [id number] [id number]  Discharge Plan: SNF    Current Diagnoses: Patient Active Problem List   Diagnosis Date Noted  . AMS (altered mental status) 07/14/2018  . Altered mental status   . Stroke (HCC) 07/10/2018  . Requires supplemental oxygen 06/05/2018  . Chest pain 03/29/2018  . Atrial fibrillation (HCC) 05/20/2015  . RBBB 05/20/2015  . Prolonged Q-T interval on ECG 05/20/2015  . CAD (coronary artery disease) 05/20/2015  . History of CVA (cerebrovascular accident) 05/20/2015  . Atrial fibrillation with RVR (HCC)   . Bronchiectasis with acute exacerbation (HCC)   . Unstable angina (HCC) 03/27/2013  . COPD exacerbation (HCC) 03/25/2013  . Diabetes mellitus type II, uncontrolled (HCC) 03/25/2013  . Hypothyroidism 03/25/2013  . BPH (benign prostatic hyperplasia) 03/25/2013  . HTN (hypertension) 03/25/2013    Orientation RESPIRATION BLADDER Height & Weight     Self  O2(Nasal Canula 1 L) Continent, External catheter Weight: 154 lb 12.2 oz (70.2 kg) Height:  5\' 8"  (172.7 cm)  BEHAVIORAL SYMPTOMS/MOOD NEUROLOGICAL BOWEL NUTRITION STATUS  (None) (None) Continent Diet(Carb modified)  AMBULATORY STATUS COMMUNICATION OF NEEDS Skin   Limited Assist Verbally Bruising                       Personal Care Assistance Level of Assistance  Bathing, Feeding, Dressing Bathing  Assistance: Limited assistance Feeding assistance: Limited assistance Dressing Assistance: Limited assistance     Functional Limitations Info  Sight, Hearing, Speech Sight Info: Adequate Hearing Info: Adequate Speech Info: Adequate    SPECIAL CARE FACTORS FREQUENCY  PT (By licensed PT), OT (By licensed OT)     PT Frequency: 5 x week OT Frequency: 5 x week            Contractures Contractures Info: Not present    Additional Factors Info  Code Status, Allergies Code Status Info: DNR Allergies Info: NKDA           Current Medications (07/31/2018):  This is the current hospital active medication list Current Facility-Administered Medications  Medication Dose Route Frequency Provider Last Rate Last Dose  . acetaminophen (TYLENOL) tablet 650 mg  650 mg Oral Q4H PRN Meccariello, Solmon IceBailey J, DO      . albuterol (PROVENTIL) (2.5 MG/3ML) 0.083% nebulizer solution 3 mL  3 mL Inhalation Q4H PRN Meccariello, Solmon IceBailey J, DO      . amLODipine (NORVASC) tablet 5 mg  5 mg Oral Daily Meccariello, Mckey J, DO   5 mg at 07/31/18 1103  . apixaban (ELIQUIS) tablet 5 mg  5 mg Oral BID Meccariello, Otero J, DO   5 mg at 07/31/18 1104  . aspirin chewable tablet 324 mg  324 mg Oral Once Dione BoozeGlick, David, MD      . atorvastatin (LIPITOR) tablet 80 mg  80 mg Oral q1800 Meccariello, Solmon IceBailey J, DO      .  brimonidine (ALPHAGAN) 0.15 % ophthalmic solution 1 drop  1 drop Both Eyes BID Meccariello, Mineer J, DO   1 drop at 07/31/18 1104  . finasteride (PROSCAR) tablet 5 mg  5 mg Oral Daily Meccariello, Rens J, DO   5 mg at 07/31/18 1104  . hydrochlorothiazide (HYDRODIURIL) tablet 25 mg  25 mg Oral Daily Meccariello, Hagg J, DO   25 mg at 07/31/18 1103  . levothyroxine (SYNTHROID) tablet 50 mcg  50 mcg Oral Q0600 MeccarielloNaden, Doolen, DO   50 mcg at 07/31/18 0602  . lisinopril (ZESTRIL) tablet 40 mg  40 mg Oral Daily Meccariello, Guard J, DO   40 mg at 07/31/18 1103  . metoprolol tartrate (LOPRESSOR) tablet  25 mg  25 mg Oral BID Meccariello, Aayansh Hancox, DO   25 mg at 07/31/18 1104  . mometasone-formoterol (DULERA) 100-5 MCG/ACT inhaler 2 puff  2 puff Inhalation BID Meccariello, Solmon Ice, DO      . olopatadine (PATANOL) 0.1 % ophthalmic solution 1 drop  1 drop Both Eyes BID Meccariello, Shere J, DO   1 drop at 07/31/18 1104  . pantoprazole (PROTONIX) EC tablet 40 mg  40 mg Oral Daily Meccariello, Sereno J, DO   40 mg at 07/31/18 1103  . polyvinyl alcohol (LIQUIFILM TEARS) 1.4 % ophthalmic solution 2 drop  2 drop Both Eyes QID PRN Meccariello, Solmon Ice, DO      . senna-docusate (Senokot-S) tablet 2 tablet  2 tablet Oral QHS Meccariello, Solmon Ice, DO      . tamsulosin (FLOMAX) capsule 0.4 mg  0.4 mg Oral Daily Meccariello, Hurwitz J, DO   0.4 mg at 07/31/18 1103     Discharge Medications: Please see discharge summary for a list of discharge medications.  Relevant Imaging Results:  Relevant Lab Results:   Additional Information SS#: [id number]. Son only wants placement at a facility that has had no confirmed cases of COVID-19.  Margarito Liner, LCSW

## 2018-07-31 NOTE — Progress Notes (Signed)
Spoke to son on phne this AM at 0800. He is agreeable for discharge to SNF after this admission. He does not believe he and his family can take care of his father at home. Son would like [place] to ask patient for his wishes.

## 2018-07-31 NOTE — ED Notes (Signed)
ED TO INPATIENT HANDOFF REPORT  ED Nurse Name and Phone #: Romeo Apple [phone]  S Name/Age/Gender [name] 85 y.o. male Room/Bed: RESUSC/RESUSC  Code Status   Code Status: Prior  Home/SNF/Other Home Patient oriented to: self and place Is this baseline? Yes   Triage Complete: Triage complete  Chief Complaint cp  Triage Note BIB [facility] from home with c/o of CP that woke pt up out of sleep. Pt took 1 nitro and 324 asp with complete relief. No pain on arrival. Pt seen here multiple times recently.    Allergies No Known Allergies  Level of Care/Admitting Diagnosis ED Disposition    None      B Medical/Surgery History Past Medical History:  Diagnosis Date  . Atrial fibrillation (HCC)   . COPD (chronic obstructive pulmonary disease) (HCC)   . CVA (cerebral infarction)   . Diabetes mellitus without complication (HCC)   . Essential hypertension, benign   . Hard of hearing    Past Surgical History:  Procedure Laterality Date  . HERNIA REPAIR    . LEFT HEART CATHETERIZATION WITH CORONARY ANGIOGRAM N/A 03/28/2013   Procedure: LEFT HEART CATHETERIZATION WITH CORONARY ANGIOGRAM;  Surgeon: Peter M Swaziland, MD;  Location: [facility] CATH LAB;  Service: Cardiovascular;  Laterality: N/A;     A IV Location/Drains/Wounds Patient Lines/Drains/Airways Status   Active Line/Drains/Airways    Name:   Placement date:   Placement time:   Site:   Days:   Peripheral IV 07/31/18 Right Forearm   07/31/18    0143    Forearm   less than 1   External Urinary Catheter   07/31/18    0131    -   less than 1          Intake/Output Last 24 hours No intake or output data in the 24 hours ending 07/31/18 0400  Labs/Imaging Results for orders placed or performed during the hospital encounter of 07/31/18 (from the past 48 hour(s))  SARS Coronavirus 2 [facility] order, Performed in [facility] hospital lab)     Status: None   Collection Time: 07/31/18  2:10 AM  Result Value Ref Range   SARS  Coronavirus 2 NEGATIVE NEGATIVE    Comment: (NOTE) If result is NEGATIVE SARS-CoV-2 target nucleic acids are NOT DETECTED. The SARS-CoV-2 RNA is generally detectable in upper and lower  respiratory specimens during the acute phase of infection. The lowest  concentration of SARS-CoV-2 viral copies this assay can detect is 250  copies / mL. A negative result does not preclude SARS-CoV-2 infection  and should not be used as the sole basis for treatment or other  patient management decisions.  A negative result may occur with  improper specimen collection / handling, submission of specimen other  than nasopharyngeal swab, presence of viral mutation(s) within the  areas targeted by this assay, and inadequate number of viral copies  (<250 copies / mL). A negative result must be combined with clinical  observations, patient history, and epidemiological information. If result is POSITIVE SARS-CoV-2 target nucleic acids are DETECTED. The SARS-CoV-2 RNA is generally detectable in upper and lower  respiratory specimens dur ing the acute phase of infection.  Positive  results are indicative of active infection with SARS-CoV-2.  Clinical  correlation with patient history and other diagnostic information is  necessary to determine patient infection status.  Positive results do  not rule out bacterial infection or co-infection with other viruses. If result is PRESUMPTIVE POSTIVE SARS-CoV-2 nucleic acids MAY  BE PRESENT.   A presumptive positive result was obtained on the submitted specimen  and confirmed on repeat testing.  While 2019 novel coronavirus  (SARS-CoV-2) nucleic acids may be present in the submitted sample  additional confirmatory testing may be necessary for epidemiological  and / or clinical management purposes  to differentiate between  SARS-CoV-2 and other Sarbecovirus currently known to infect humans.  If clinically indicated additional testing with an alternate test  methodology  [phone]([id number]) is advised. The SARS-CoV-2 RNA is generally  detectable in upper and lower respiratory sp ecimens during the acute  phase of infection. The expected result is Negative. Fact Sheet for Patients:  BoilerBrush.com.cyhttps://www.fda.gov/media/136312/download Fact Sheet for Healthcare Providers: https://pope.com/https://www.fda.gov/media/136313/download This test is not yet approved or cleared by the [place] FDA and has been authorized for detection and/or diagnosis of SARS-CoV-2 by FDA under an Emergency Use Authorization (EUA).  This EUA will remain in effect (meaning this test can be used) for the duration of the COVID-19 declaration under Section 564(b)(1) of the Act, 21 U.S.C. section 360bbb-3(b)(1), unless the authorization is terminated or revoked sooner. Performed at [facility], [address] [place], [place] [address]   Comprehensive metabolic panel     Status: Abnormal   Collection Time: 07/31/18  2:10 AM  Result Value Ref Range   Sodium 140 135 - 145 mmol/L   Potassium 4.1 3.5 - 5.1 mmol/L   Chloride 110 98 - 111 mmol/L   CO2 22 22 - 32 mmol/L   Glucose, Bld 168 (H) 70 - 99 mg/dL   BUN 21 8 - 23 mg/dL   Creatinine, Ser 5.781.23 0.61 - 1.24 mg/dL   Calcium 9.3 8.9 - 46.910.3 mg/dL   Total Protein 5.6 (L) 6.5 - 8.1 g/dL   Albumin 3.1 (L) 3.5 - 5.0 g/dL   AST 15 15 - 41 U/L   ALT 15 0 - 44 U/L   Alkaline Phosphatase 43 38 - 126 U/L   Total Bilirubin 0.6 0.3 - 1.2 mg/dL   GFR calc non Af Amer 52 (L) >60 mL/min   GFR calc Af Amer >60 >60 mL/min   Anion gap 8 5 - 15    Comment: Performed at [facility], [address] [place], [place] [address]  CBC WITH DIFFERENTIAL     Status: Abnormal   Collection Time: 07/31/18  2:10 AM  Result Value Ref Range   WBC 6.4 4.0 - 10.5 K/uL   RBC 4.10 (L) 4.22 - 5.81 MIL/uL   Hemoglobin 11.8 (L) 13.0 - 17.0 g/dL   HCT 84.137.0 (L) 32.439.0 - 40.152.0 %   MCV 90.2 80.0 - 100.0 fL   MCH 28.8 26.0 - 34.0 pg   MCHC 31.9 30.0 - 36.0 g/dL   RDW 02.713.4 25.311.5 - 66.415.5  %   Platelets 209 150 - 400 K/uL   nRBC 0.0 0.0 - 0.2 %   Neutrophils Relative % 64 %   Neutro Abs 4.1 1.7 - 7.7 K/uL   Lymphocytes Relative 17 %   Lymphs Abs 1.1 0.7 - 4.0 K/uL   Monocytes Relative 11 %   Monocytes Absolute 0.7 0.1 - 1.0 K/uL   Eosinophils Relative 6 %   Eosinophils Absolute 0.4 0.0 - 0.5 K/uL   Basophils Relative 1 %   Basophils Absolute 0.1 0.0 - 0.1 K/uL   Immature Granulocytes 1 %   Abs Immature Granulocytes 0.03 0.00 - 0.07 K/uL    Comment: Performed at [facility],  [address] [place], [place] [address]  Troponin I - Once     Status: None   Collection Time: 07/31/18  2:10 AM  Result Value Ref Range   Troponin I <0.03 <0.03 ng/mL    Comment: Performed at [facility], [address] [place], [place] [address]   Dg Chest Port 1 View  Result Date: 07/31/2018 CLINICAL DATA:  Chest pain EXAM: PORTABLE CHEST 1 VIEW COMPARISON:  07/19/2018 FINDINGS: Heart is upper limits normal is size. Bibasilar linear densities, likely scarring. No acute confluent opacities or effusions. No acute bony abnormality. IMPRESSION: Chronic changes.  No active disease. Electronically Signed   By: Charlett Nose M.D.   On: 07/31/2018 01:55    Pending Labs Unresulted Labs (From admission, onward)   None      Vitals/Pain Today's Vitals   07/31/18 0213 07/31/18 0215 07/31/18 0245 07/31/18 0300  BP:  (!) 153/51 138/63 (!) 153/56  Pulse:  (!) 28 65 67  Resp:  (!) 24 20 (!) 21  Temp:      TempSrc:      SpO2:  96% 93% 94%  Weight:      Height:      PainSc: Asleep       Isolation Precautions No active isolations  Medications Medications  aspirin chewable tablet 324 mg (324 mg Oral Not Given 07/31/18 0149)    Mobility walks with person assist High fall risk   Focused Assessments Cardiac Assessment Handoff:  Cardiac Rhythm: Normal sinus rhythm Lab Results  Component Value Date   CKTOTAL 65 03/06/2007   CKMB 1.6 03/06/2007   TROPONINI <0.03 07/31/2018    Lab Results  Component Value Date   DDIMER 0.57 (H) 02/02/2018   Does the Patient currently have chest pain? No     R Recommendations: See Admitting Provider Note  Report given to:   Additional Notes: N/A

## 2018-07-31 NOTE — ED Provider Notes (Signed)
[facility] EMERGENCY [facility]MENT Provider Note   CSN: [id number] Arrival date & time: 07/31/18  0111    History   Chief Complaint Chief Complaint  Patient presents with  . Chest Pain    HPI [name] is a 85 y.o. male.   The history is provided by the patient.  Chest Pain  He has history of hypertension, diabetes, atrial fibrillation, stroke, coronary artery disease and was brought to the ED because of chest pain.  Patient states that he is not having any pain now but thinks he did have pain earlier tonight.  He is a very poor and vague historian.  He has no other complaints.  He denies dyspnea, nausea, diaphoresis.  Past Medical History:  Diagnosis Date  . Atrial fibrillation (HCC)   . COPD (chronic obstructive pulmonary disease) (HCC)   . CVA (cerebral infarction)   . Diabetes mellitus without complication (HCC)   . Essential hypertension, benign   . Hard of hearing     Patient Active Problem List   Diagnosis Date Noted  . AMS (altered mental status) 07/14/2018  . Altered mental status   . Stroke (HCC) 07/10/2018  . Requires supplemental oxygen 06/05/2018  . Chest pain 03/29/2018  . Atrial fibrillation (HCC) 05/20/2015  . RBBB 05/20/2015  . Prolonged Q-T interval on ECG 05/20/2015  . CAD (coronary artery disease) 05/20/2015  . History of CVA (cerebrovascular accident) 05/20/2015  . Atrial fibrillation with RVR (HCC)   . Bronchiectasis with acute exacerbation (HCC)   . Unstable angina (HCC) 03/27/2013  . COPD exacerbation (HCC) 03/25/2013  . Diabetes mellitus type II, uncontrolled (HCC) 03/25/2013  . Hypothyroidism 03/25/2013  . BPH (benign prostatic hyperplasia) 03/25/2013  . HTN (hypertension) 03/25/2013    Past Surgical History:  Procedure Laterality Date  . HERNIA REPAIR    . LEFT HEART CATHETERIZATION WITH CORONARY ANGIOGRAM N/A 03/28/2013   Procedure: LEFT HEART CATHETERIZATION WITH CORONARY ANGIOGRAM;  Surgeon: Peter M SwazilandJordan,  MD;  [facility];  Service: Cardiovascular;  Laterality: N/A;        Home Medications    Prior to Admission medications   Medication Sig Start Date End Date Taking? Authorizing Provider  albuterol (PROVENTIL HFA;VENTOLIN HFA) 108 (90 Base) MCG/ACT inhaler Inhale 2 puffs into the lungs every 4 (four) hours as needed for wheezing or shortness of breath. 05/17/15   Horton, Mayer Maskerourtney F, MD  amLODipine (NORVASC) 5 MG tablet Take 5 mg by mouth daily.    [provider]  apixaban (ELIQUIS) 5 MG TABS tablet Take 1 tablet (5 mg total) by mouth 2 (two) times daily. 02/03/18   Marguerita MerlesSheikh, Omair Latif, DO  atorvastatin (LIPITOR) 80 MG tablet Take 1 tablet (80 mg total) by mouth daily at 6 PM. 07/12/18   Leland HerYoo, Elsia J, DO  brimonidine (ALPHAGAN) 0.15 % ophthalmic solution Place 1 drop into both eyes 2 (two) times daily.     [provider]  budesonide-formoterol (SYMBICORT) 80-4.5 MCG/ACT inhaler Inhale 2 puffs into the lungs 2 (two) times daily.    [provider]  Carboxymethylcellulose Sodium 0.25 % SOLN Place 2 drops into both eyes 4 (four) times daily as needed (for dry eyes).     [provider]  cephALEXin (KEFLEX) 500 MG capsule Take 1 capsule (500 mg total) by mouth 2 (two) times daily. 07/29/18   Gilda CreasePollina, Christopher J, MD  finasteride (PROSCAR) 5 MG tablet Take 5 mg by mouth daily.    [provider]  glucose blood (PRECISION PCX) test strip Use as instructed,ICD-10 E11.8 check blood sugar BID and PRN 07/22/18   Bing Neighbors, FNP  hydrochlorothiazide (HYDRODIURIL) 25 MG tablet Take 25 mg by mouth daily.     [provider]  hydrOXYzine (ATARAX/VISTARIL) 50 MG tablet Take 1 tablet (50 mg total) by mouth 3 (three) times daily as needed. 07/22/18   Bing Neighbors, FNP  Ipratropium-Albuterol (COMBIVENT RESPIMAT) 20-100 MCG/ACT AERS respimat Inhale 1 puff into the lungs every 6 (six) hours as needed for wheezing.     [provider]  levalbuterol Pauline Aus) 0.63 MG/3ML nebulizer solution Take 3 mLs (0.63 mg total) by nebulization every 6 (six) hours as needed for wheezing or shortness of breath. 02/03/18   Marguerita Merles Latif, DO  levothyroxine (SYNTHROID, LEVOTHROID) 25 MCG tablet Take 2 tablets (50 mcg total) by mouth daily before breakfast. 06/11/18   Bing Neighbors, FNP  lisinopril (PRINIVIL,ZESTRIL) 40 MG tablet Take 40 mg by mouth daily.    [provider]  metFORMIN (GLUCOPHAGE) 500 MG tablet Take 1 tablet (500 mg total) by mouth 2 (two) times daily with a meal. 07/22/18   Bing Neighbors, FNP  metoprolol (LOPRESSOR) 50 MG tablet Take 25 mg by mouth 2 (two) times daily.    [provider]  Multiple Vitamins-Minerals (MULTIVITAMIN WITH MINERALS) tablet Take 1 tablet by mouth daily.    [provider]  nitroGLYCERIN (NITROSTAT) 0.4 MG SL tablet Place 0.4 mg under the tongue every 5 (five) minutes as needed for chest pain. Dissolve one tablet under the tongue every 5 minutes as needed for chest pain. Repeat every 5 minutes if needed for a total of 3 tablets in 15 minutes. If no relief, call 911.    [provider]  olopatadine (PATANOL) 0.1 % ophthalmic solution Place 1 drop into both eyes 2 (two) times daily. 06/05/18   Bing Neighbors, FNP  omeprazole (PRILOSEC) 20 MG capsule Take 20 mg by mouth daily. .    [provider]  senna-docusate (SENOKOT-S) 8.6-50 MG tablet Take 2 tablets by mouth at bedtime. 06/05/18   Bing Neighbors, FNP  Skin Protectants, Misc. (EUCERIN) cream Apply 1 application topically See admin instructions. Apply small amount to affected area twice a day.    [provider]  tamsulosin (FLOMAX) 0.4 MG CAPS capsule Take 1 capsule (0.4 mg total) by mouth daily. 07/22/18   Bing Neighbors, FNP    Family History Family History  Problem Relation Age of Onset  . CAD Son   . Skin cancer Brother     Social History Social History    Tobacco Use  . Smoking status: Former Smoker    Types: Cigarettes  . Smokeless tobacco: Former Engineer, water Use Topics  . Alcohol use: No  . Drug use: No     Allergies   Patient has no known allergies.   Review of Systems Review of Systems  Cardiovascular: Positive for chest pain.  All other systems reviewed and are negative.    Physical Exam Updated Vital Signs BP (!) 151/46 (BP Location: Left Arm)   Pulse 72   Temp 98.7 F (37.1 C) (Oral)   Resp (!) 25   Ht  (1.727 m)   Wt 72.6 kg   SpO2 95%   BMI 24.33 kg/m   Physical Exam Vitals signs and nursing note reviewed.    85 year old male, resting comfortably and in no acute distress. Vital signs  are significant for elevated systolic blood pressure and respiratory rate. Oxygen saturation is 95%, which is normal. Head is normocephalic and atraumatic. PERRLA, EOMI. Oropharynx is clear. Neck is nontender and supple without adenopathy or JVD. Back is nontender and there is no CVA tenderness. Lungs are clear without rales, wheezes, or rhonchi. Chest is nontender. Heart has regular rate and rhythm without murmur. Abdomen is soft, flat, nontender without masses or hepatosplenomegaly and peristalsis is normoactive. Extremities have no cyanosis or edema, full range of motion is present. Skin is warm and dry without rash. Neurologic: Awake, alert, oriented to person and place but not time, cranial nerves are intact, there are no motor or sensory deficits.  ED Treatments / Results  Labs (all labs ordered are listed, but only abnormal results are displayed) Labs Reviewed - No data to display  EKG None  Radiology No results found.  Procedures Procedures (including critical care time)  Medications Ordered in ED Medications - No data to display   Initial Impression / Assessment and Plan / ED Course  I have reviewed the triage vital signs and the nursing notes.  Pertinent labs & imaging results that were  available during my care of the patient were reviewed by me and considered in my medical decision making (see chart for details).  Chest pain in patient with known coronary artery disease and cerebrovascular disease.  ECG is unchanged from baseline.  Will check troponins.  Will likely need admission for serial troponins.  Troponin is normal.  Chest x-ray is unremarkable.  Labs do show mild anemia which is unchanged from baseline.  Case is discussed with the resident on-call for family practice service who agrees to admit the patient for serial troponins.  Final Clinical Impressions(s) / ED Diagnoses   Final diagnoses:  Nonspecific chest pain  Normochromic normocytic anemia    ED Discharge Orders    None       Dione Booze, MD 07/31/18 [phone]

## 2018-07-31 NOTE — TOC Initial Note (Signed)
Transition of Care [facility]) - Initial/Assessment Note    Patient Details  Name: KADAN BASARA MRN: [id number] Date of Birth: Jun 16, 1927  Transition of [facility] CM/SW Contact:    Margarito Liner, LCSW Phone Number: 07/31/2018, 12:59 PM  Clinical Narrative: Patient only oriented to self. CSW called son, introduced role, and explained that PT recommendations would be discussed. He is agreeable to SNF placement but only at a facility that has had no COVID cases. He stated [facility] which is closest to his home has had confirmed cases. CSW explained how to access CMS Medicare scores online. No further concerns. CSW encouraged patient's son to contact CSW as needed. CSW will continue to follow patient and his son for support and facilitate discharge to SNF once medically stable.              Expected Discharge Plan: Skilled Nursing Facility Barriers to Discharge: Continued Medical Work up   Patient Goals and CMS Choice Patient states their goals for this hospitalization and ongoing recovery are:: Patient only oriented to self. CMS Medicare.gov Compare Post Acute Care list provided to:: Patient Represenative (must comment)(Told son how to access online.)    Expected Discharge Plan and Services Expected Discharge Plan: Skilled Nursing Facility     Post Acute Care Choice: Skilled Nursing Facility Living arrangements for the past 2 months: Single Family Home                                      Prior Living Arrangements/Services Living arrangements for the past 2 months: Single Family Home Lives with:: Adult Children Patient language and need for interpreter reviewed:: No Do you feel safe going back to the place where you live?: Yes      Need for Family Participation in Patient Care: Yes (Comment) Care giver support system in place?: Yes (comment)(Plan for SNF. Lives home with son and daughter-in-law.)   Criminal Activity/Legal Involvement Pertinent to Current  Situation/Hospitalization: No - Comment as needed  Activities of Daily Living      Permission Sought/Granted Permission sought to share information with : Facility Medical sales representative, Family Supports Permission granted to share information with : (Patient only oriented to self.)  Share Information with NAME: Deklan Rozell  Permission granted to share info w AGENCY: SNF's  Permission granted to share info w Relationship: Son  Permission granted to share info w Contact Information: [phone]  Emotional Assessment Appearance:: Appears stated age Attitude/Demeanor/Rapport: Unable to Assess Affect (typically observed): Unable to Assess Orientation: : Oriented to Self Alcohol / Substance Use: Never Used Psych Involvement: No (comment)  Admission diagnosis:  Normochromic normocytic anemia [D64.9] Nonspecific chest pain [R07.9] Patient Active Problem List   Diagnosis Date Noted  . AMS (altered mental status) 07/14/2018  . Altered mental status   . Stroke (HCC) 07/10/2018  . Requires supplemental oxygen 06/05/2018  . Chest pain 03/29/2018  . Atrial fibrillation (HCC) 05/20/2015  . RBBB 05/20/2015  . Prolonged Q-T interval on ECG 05/20/2015  . CAD (coronary artery disease) 05/20/2015  . History of CVA (cerebrovascular accident) 05/20/2015  . Atrial fibrillation with RVR (HCC)   . Bronchiectasis with acute exacerbation (HCC)   . Unstable angina (HCC) 03/27/2013  . COPD exacerbation (HCC) 03/25/2013  . Diabetes mellitus type II, uncontrolled (HCC) 03/25/2013  . Hypothyroidism 03/25/2013  . BPH (benign prostatic hyperplasia) 03/25/2013  . HTN (hypertension) 03/25/2013   PCP:  Bing NeighborsHarris, Kimberly S, FNP Pharmacy:   [facility] [phone] [place] , [place] - [phone] [facility] [address] [facility] [facility] [address] [place] [address] Phone: [phone] Fax: [phone]  [facility] [place], [place] - [address] [place] [address] Phone: [phone] Fax: [phone]     Social Determinants of Health (SDOH) Interventions    Readmission Risk Interventions No flowsheet data found.

## 2018-07-31 NOTE — Progress Notes (Signed)
Initial Nutrition Assessment  DOCUMENTATION CODES:   Non-severe (moderate) malnutrition in context of chronic illness  INTERVENTION:   -Ensure Enlive po BID, each supplement provides 350 kcal and 20 grams of protein -MVI with minerals daily  NUTRITION DIAGNOSIS:   Moderate Malnutrition related to chronic illness(COPD, CVA) as evidenced by mild fat depletion, moderate fat depletion, mild muscle depletion, moderate muscle depletion.  GOAL:   Patient will meet greater than or equal to 90% of their needs  MONITOR:   PO intake, Supplement acceptance, Labs, Weight trends, Skin, I & O's  REASON FOR ASSESSMENT:   Consult Assessment of nutrition requirement/status  ASSESSMENT:   [name] is a 85 y.o. male presenting with chest pain. PMH is significant for prior episodes of AMS, COPD, T2DM, atrial fibrillation on Eliquis, HTN, CAD, h/o recent and remote CVA's.  Pt admitted with chest pain, ACS rule out.   I/O's data not current available  Pt pleasant, but unable to contribute much to history. He answered each question with "yeah" or "uh-huh".   Pt reports good appetite PTA, reporting he consumes 3 meals per day. He was unable to provide any insight into his diet recall. Observed meal tray- pt consumed 75% of breakfast meal. Pt denies any chewing or swallowing difficulties with textures.   Pt denies any weight loss. Noted weight stability in the past year. Suspect edema may be masking true weight loss and further muscle depletions.   When a patient presents with low albumin, it is likely skewed due to the acute inflammatory response.  Unless it is suspected that patient had poor PO intake or malnutrition prior to admission, then RD should not be consulted solely for low albumin. Note that low albumin is no longer used to diagnose malnutrition; [place] uses the new malnutrition guidelines published by the American Society for Parenteral and Enteral Nutrition (A.S.P.E.N.) and  the Academy of Nutrition and Dietetics (AND).  When a patient presents with low albumin, it is likely skewed due to the acute inflammatory response.  Unless it is suspected that patient had poor PO intake or malnutrition prior to admission, then RD should not be consulted solely for low albumin. Note that low albumin is no longer used to diagnose malnutrition; [place] uses the new malnutrition guidelines published by the American Society for Parenteral and Enteral Nutrition (A.S.P.E.N.) and the Academy of Nutrition and Dietetics (AND).    Lab Results  Component Value Date   HGBA1C 7.9 (H) 07/10/2018   PTA DM medications are 500 mg metformin BID. ADA's Standard of Diabetes Medical Care recommend less stringent glycemic goals for older adults. Per recommendations, Hgb A1c for older adults with multiple cormorbidities with cognitive impairments should be <8.5%. Given these guidelines, pt is meeting glycemic targets.   Labs reviewed: CBGS: 215 (inpatient orders for glycemic control are 0-9 units insulin aspart TID with meals).   NUTRITION - FOCUSED PHYSICAL EXAM:    Most Recent Value  Orbital Region  Moderate depletion  Upper Arm Region  Mild depletion  Thoracic and Lumbar Region  Mild depletion  Buccal Region  Mild depletion  Temple Region  Mild depletion  Clavicle Bone Region  Mild depletion  Clavicle and Acromion Bone Region  Mild depletion  Scapular Bone Region  Mild depletion  Dorsal Hand  No depletion  Patellar Region  Mild depletion  Anterior Thigh Region  No depletion  Posterior Calf Region  No depletion  Edema (RD Assessment)  Mild  Hair  Reviewed  Eyes  Reviewed  Mouth  Reviewed  Skin  Reviewed  Nails  Reviewed       Diet Order:   Diet Order            Diet Carb Modified Fluid consistency: Thin; Room service appropriate? Yes  Diet effective now              EDUCATION NEEDS:   Education needs have been addressed  Skin:  Skin Assessment: Reviewed RN  Assessment  Last BM:  07/29/18  Height:   Ht Readings from Last 1 Encounters:  07/31/18 5\' 8"  (1.727 m)    Weight:   Wt Readings from Last 1 Encounters:  07/31/18 70.2 kg    Ideal Body Weight:  70 kg  BMI:  Body mass index is 23.53 kg/m.  Estimated Nutritional Needs:   Kcal:  1900-2100  Protein:  90-105 grams  Fluid:  1.9-2.1 L    Mico Spark A. Mayford Knife, RD, LDN, CDCES Registered Dietitian II Certified Diabetes Care and Education Specialist Pager: [phone] After hours Pager: [phone]

## 2018-07-31 NOTE — Evaluation (Signed)
Occupational Therapy Evaluation Patient Details Name: Bruce ElizabethWilburn L Bieler MRN: [id number] DOB: 01/09/1928 Today's Date: 07/31/2018    History of Present Illness [name] is a 85 y.o. male presenting with chest pain. PMH is significant for prior episodes of AMS, COPD, T2DM, atrial fibrillation on Eliquis, HTN, CAD, h/o recent and remote CVA's.   Clinical Impression   Pt was admitted for the above. He has had several recent admissions.  Pt demonstrates poor attention; ? Memory vs language deficits.  He needs min guard A for SPT and min to mod  A for LB adls. Will follow in acute setting with supervision level goals. Pt would benefit from SNF unless he has 24/7.  If he returns home, recommend HHOT    Follow Up Recommendations  SNF;Supervision/Assistance - 24 hour   HHOT if home    Equipment Recommendations  None recommended by OT    Recommendations for Other Services       Precautions / Restrictions Precautions Precautions: Fall Precaution Comments: pt reports fall(s) at home Restrictions Weight Bearing Restrictions: No      Mobility Bed Mobility                  Transfers Overall transfer level: Needs assistance Equipment used: Rolling walker (2 wheeled) Transfers: Sit to/from Stand Sit to Stand: Min guard;Min assist         General transfer comment: min guard for stand to sit; Min assist to control descent to sit; cues for hand placement, control, and safety    Balance Overall balance assessment: Needs assistance   Sitting balance-Leahy Scale: Good       Standing balance-Leahy Scale: Poor Standing balance comment: relaint on UE support dynamically                           ADL either performed or assessed with clinical judgement   ADL   Eating/Feeding: Set up;Sitting   Grooming: Wash/dry hands;Wash/dry face;Set up;Sitting   Upper Body Bathing: Sitting;Set up   Lower Body Bathing: Sit to/from stand;Minimal assistance   Upper Body  Dressing : Set up;Sitting   Lower Body Dressing: Minimal assistance;Moderate assistance;Sit to/from stand Lower Body Dressing Details (indicate cue type and reason): difficulty reaching to R sock Toilet Transfer: Min guard;Stand-pivot(to chair)   Toileting- Clothing Manipulation and Hygiene: Min guard;Sit to/from stand               Vision         Perception     Praxis      Pertinent Vitals/Pain Pain Assessment: No/denies pain     Hand Dominance Left   Extremity/Trunk Assessment Upper Extremity Assessment RUE Deficits / Details: uses as assist for moving items on tray RUE Coordination: decreased fine motor   Lower Extremity Assessment Lower Extremity Assessment: Generalized weakness   Cervical / Trunk Assessment Cervical / Trunk Assessment: Other exceptions Cervical / Trunk Exceptions: Forward head posture with rounded shoulders   Communication Communication Communication: HOH   Cognition Arousal/Alertness: Awake/alert Behavior During Therapy: WFL for tasks assessed/performed Overall Cognitive Status: Impaired/Different from baseline                   Orientation Level: Place;Time Current Attention Level: Sustained     Safety/Judgement: Decreased awareness of safety;Decreased awareness of deficits Awareness: Intellectual Problem Solving: Decreased initiation;Difficulty sequencing;Slow processing;Requires verbal cues;Requires tactile cues General Comments: distracted by things on tray.  Unable to state where he is and why, ?  memory/orientation vs language   General Comments  Walked on supplemental O2; VSS    Exercises     Shoulder Instructions      Home Living Family/patient expects to be discharged to:: Private residence Living Arrangements: Children Available Help at Discharge: Family(Son works in home improvement) Type of Home: House Home Access: Level entry     Home Layout: One level     Bathroom Shower/Tub: Scientist, research (life sciences): Standard     Home Equipment: Environmental consultant - 2 wheels;Bedside commode;Shower seat      Lives With: Son    Prior Functioning/Environment Level of Independence: Needs assistance  Gait / Transfers Assistance Needed: Unsure; unreliable historian     Comments: states son would walk with him and help with adls as needed; but also stated son is still working        OT Problem List: Decreased strength;Decreased range of motion;Decreased activity tolerance;Impaired balance (sitting and/or standing);Decreased coordination;Decreased knowledge of use of DME or AE;Decreased safety awareness      OT Treatment/Interventions: Self-care/ADL training;Therapeutic exercise;Neuromuscular education;DME and/or AE instruction;Therapeutic activities;Patient/family education;Balance training;Cognitive remediation/compensation    OT Goals(Current goals can be found in the care plan section) Acute Rehab OT Goals Patient Stated Goal: none stated OT Goal Formulation: With patient Time For Goal Achievement: 08/14/18 Potential to Achieve Goals: Good ADL Goals Pt Will Transfer to Toilet: with supervision;ambulating;bedside commode Pt/caregiver will Perform Home Exercise Program: Increased strength;Right Upper extremity(and [facility]) Additional ADL Goal #1: pt will demonstrate selective attention in minimally distracting environment during adls for 5 min with 2 cues Additional ADL Goal #2: pt will perform LB adls with min guard to min A  OT Frequency: Min 2X/week   Barriers to D/C:            Co-evaluation              AM-PAC OT "6 Clicks" Daily Activity     Outcome Measure Help from another person eating meals?: A Little Help from another person taking care of personal grooming?: A Little Help from another person toileting, which includes using toliet, bedpan, or urinal?: A Little Help from another person bathing (including washing, rinsing, drying)?: A Little Help from another person to  put on and taking off regular upper body clothing?: A Little Help from another person to put on and taking off regular lower body clothing?: A Lot 6 Click Score: 17   End of Session    Activity Tolerance: Patient tolerated treatment well Patient left: in chair;with call bell/phone within reach;with chair alarm set  OT Visit Diagnosis: Unsteadiness on feet (R26.81);Muscle weakness (generalized) (M62.81)                Time: 3149-7026 OT Time Calculation (min): 23 min Charges:  OT General Charges $OT Visit: 1 Visit OT Evaluation $OT Eval Moderate Complexity: 1 Mod  Marica Otter, OTR/L Acute Rehabilitation Services [phone] [facility] pager [phone] office 07/31/2018  Emmarae Cowdery 07/31/2018, 11:12 AM

## 2018-07-31 NOTE — Evaluation (Signed)
Physical Therapy Evaluation Patient Details Name: Bruce ElizabethWilburn L Fennell MRN: [id number] DOB: 08/04/1927 Today's Date: 07/31/2018   History of Present Illness  [name] is a 85 y.o. male presenting with chest pain. PMH is significant for prior episodes of AMS, COPD, T2DM, atrial fibrillation on Eliquis, HTN, CAD, h/o recent and remote CVA's.  Clinical Impression   Pt admitted with above diagnosis. Pt currently with functional limitations due to the deficits listed below (see PT Problem List). Back again to hospital from recent admission; Noted difficulty managing at home; Presents to PT with decr functional mobility, decr activity tolerance, FTT; REcommend sNFf or post-acute rehabilitation;  Pt will benefit from skilled PT to increase their independence and safety with mobility to allow discharge to the venue listed below.       Follow Up Recommendations SNF    Equipment Recommendations  None recommended by PT    Recommendations for Other Services       Precautions / Restrictions Precautions Precautions: Fall Precaution Comments: pt reports fall(s) at home Restrictions Weight Bearing Restrictions: No      Mobility  Bed Mobility                  Transfers Overall transfer level: Needs assistance Equipment used: Rolling walker (2 wheeled) Transfers: Sit to/from Stand Sit to Stand: Min guard;Min assist         General transfer comment: min guard for stand to sit; Min assist to control descent to sit; cues for hand placement, control, and safety  Ambulation/Gait Ambulation/Gait assistance: Min assist Gait Distance (Feet): 200 Feet Assistive device: Rolling walker (2 wheeled) Gait Pattern/deviations: Step-through pattern;Decreased stride length;Trunk flexed;Drifts right/left Gait velocity: decreased   General Gait Details: Noting erratic step width, indicative of decr stabilty in single limb stance R and LLEs  Stairs            Wheelchair Mobility     Modified Rankin (Stroke Patients Only)       Balance Overall balance assessment: Needs assistance   Sitting balance-Leahy Scale: Good       Standing balance-Leahy Scale: Poor Standing balance comment: relaint on UE support dynamically                             Pertinent Vitals/Pain Pain Assessment: No/denies pain    Home Living Family/patient expects to be discharged to:: Private residence Living Arrangements: Children Available Help at Discharge: Family(Son works in home improvement) Type of Home: House Home Access: Level entry     Home Layout: One level Home Equipment: Environmental consultantWalker - 2 wheels;Bedside commode;Shower seat      Prior Function Level of Independence: Needs assistance   Gait / Transfers Assistance Needed: Unsure; unreliable historian     Comments: states son would walk with him and help with adls as needed; but also stated son is still working     Higher education careers adviserHand Dominance   Dominant Hand: Left    Extremity/Trunk Assessment   Upper Extremity Assessment Upper Extremity Assessment: Defer to OT evaluation RUE Deficits / Details: uses as assist for moving items on tray    Lower Extremity Assessment Lower Extremity Assessment: Generalized weakness    Cervical / Trunk Assessment Cervical / Trunk Assessment: Other exceptions Cervical / Trunk Exceptions: Forward head posture with rounded shoulders  Communication   Communication: HOH  Cognition Arousal/Alertness: Awake/alert Behavior During Therapy: WFL for tasks assessed/performed Overall Cognitive Status: Impaired/Different from baseline  Orientation Level: Place;Time Current Attention Level: Sustained     Safety/Judgement: Decreased awareness of safety;Decreased awareness of deficits Awareness: Intellectual Problem Solving: Decreased initiation;Difficulty sequencing;Slow processing;Requires verbal cues;Requires tactile cues General Comments: distracted by things on  tray.  Unable to state where he is and why, ? memory/orientation vs language      General Comments General comments (skin integrity, edema, etc.): Walked on supplemental O2; VSS    Exercises     Assessment/Plan    PT Assessment Patient needs continued PT services  PT Problem List Decreased strength;Decreased activity tolerance;Decreased balance;Decreased mobility;Decreased knowledge of use of DME;Decreased safety awareness;Decreased knowledge of precautions       PT Treatment Interventions DME instruction;Gait training;Stair training;Functional mobility training;Therapeutic activities;Therapeutic exercise;Neuromuscular re-education;Cognitive remediation;Patient/family education    PT Goals (Current goals can be found in the Care Plan section)  Acute Rehab PT Goals Patient Stated Goal: none stated PT Goal Formulation: With patient Time For Goal Achievement: 08/14/18 Potential to Achieve Goals: Good    Frequency Min 3X/week   Barriers to discharge Decreased caregiver support      Co-evaluation               AM-PAC PT "6 Clicks" Mobility  Outcome Measure Help needed turning from your back to your side while in a flat bed without using bedrails?: A Little Help needed moving from lying on your back to sitting on the side of a flat bed without using bedrails?: A Little Help needed moving to and from a bed to a chair (including a wheelchair)?: A Little Help needed standing up from a chair using your arms (e.g., wheelchair or bedside chair)?: A Little Help needed to walk in hospital room?: A Little Help needed climbing 3-5 steps with a railing? : A Lot 6 Click Score: 17    End of Session Equipment Utilized During Treatment: Gait belt Activity Tolerance: Patient tolerated treatment well Patient left: in chair;with call bell/phone within reach;with chair alarm set Nurse Communication: Mobility status PT Visit Diagnosis: Unsteadiness on feet (R26.81);Difficulty in walking,  not elsewhere classified (R26.2)    Time: 0903-0149 PT Time Calculation (min) (ACUTE ONLY): 28 min   Charges:   PT Evaluation $PT Eval Moderate Complexity: 1 Mod PT Treatments $Gait Training: 8-22 mins        Van Clines, PT  Acute Rehabilitation Services Pager [phone] Office [phone]   [name] 07/31/2018, 10:43 AM

## 2018-07-31 NOTE — Discharge Summary (Addendum)
Family Medicine Teaching [facility] Discharge Summary  Patient name: [name] Medical record number: [id number] Date of birth: 04-Apr-1927 Age: 85 y.o. Gender: male Date of Admission: 07/31/2018  Date of Discharge: 08/01/18  Admitting Physician: Gwenlyn Fudge, MD  Primary Care Provider: Bing Neighbors, FNP Consultants: none  Indication for Hospitalization: chest pain  Discharge Diagnoses/Problem List:  Chest pain Recent acute left pons infarct 07/10/2018 VRE UTI; hematuria Protein calorie malnutrition Bilateral eye irritation with glaucoma Atrial fibrillation T2 DM HLD HTN COPD Hypothyroidism Normocytic anemia BPH   Disposition: SNF  Discharge Condition: Stable  Discharge Exam: (per Dr. Selena Batten on 08/01/2018) General: NAD, non-toxic, well-appearing, sitting comfortably in bed.  Cardiovascular: RRR, normal S1, S2. 2+ RP bilaterally. No BLEE Respiratory: CTAB. No IWOB.  Abdomen: + BS. NT, ND, soft to palpation.  Neuro: A & O x 1. CN intact.   Brief Hospital Course:  [name] is an 85 year old male who is presented with chest pain from home.  Patient had recent stroke on 4/15 and was readmitted on 4/19 for concern with altered mental status and ability for family to care for patient.  Patient was recommended for SNF however was discharged home with home health per family wishes.  Family reported that patient was grabbing chest and complaining of chest pain which is brought him into the emergency room to be evaluated for ACS rule out.  While admitted patient with no changes to EKG had troponins were negative x3 and vital signs remained stable.    Patient was also found to have VRE UTI, and augmentin was started given susceptibility to ampicillin. Patient will be treated as compliacted UTI given that he is an elderly male with 14 day course.   Physical therapy and Occupational Therapy both worked with patient while he was admitted to the hospital and  recommended SNF and patient was stable upon discharge to [facility].   Issues for Follow Up:  1. Patient will need to complete 14 day course of augmentin 875-125 mg BID for 14 day course ending on 08/15/2018. Recommend follow up UA after treatment to ensure resolution of hematuria. 2. Patient may benefit from decreasing or stopping HCTZ if he becomes symptomatic or BP continue to be low.  3. Patient's family has had many issues being able to manage patient at home, ensure that they have all necessary equipment when he goes home.  Significant Procedures: none  Significant Labs and Imaging:  Recent Labs  Lab 07/28/18 2338 07/31/18 0210  WBC 7.2 6.4  HGB 11.4* 11.8*  HCT 36.8* 37.0*  PLT 204 209   Recent Labs  Lab 07/28/18 2338 07/31/18 0210  NA 140 140  K 4.0 4.1  CL 109 110  CO2 24 22  GLUCOSE 170* 168*  BUN 23 21  CREATININE 1.21 1.23  CALCIUM 9.1 9.3  ALKPHOS  --  43  AST  --  15  ALT  --  15  ALBUMIN  --  3.1*    Results/Tests Pending at Time of Discharge: none  Discharge Medications:  Allergies as of 08/01/2018   No Known Allergies     Medication List    STOP taking these medications   cephALEXin 500 MG capsule Commonly known as:  KEFLEX     TAKE these medications   albuterol 108 (90 Base) MCG/ACT inhaler Commonly known as:  VENTOLIN HFA Inhale 2 puffs into the lungs every 4 (four) hours as needed for wheezing or shortness of breath.   amLODipine  5 MG tablet Commonly known as:  NORVASC Take 5 mg by mouth daily.   amoxicillin-clavulanate 875-125 MG tablet Commonly known as:  AUGMENTIN Take 1 tablet by mouth every 12 (twelve) hours for 14 days.   apixaban 5 MG Tabs tablet Commonly known as:  ELIQUIS Take 1 tablet (5 mg total) by mouth 2 (two) times daily.   atorvastatin 80 MG tablet Commonly known as:  LIPITOR Take 1 tablet (80 mg total) by mouth daily at 6 PM.   brimonidine 0.15 % ophthalmic solution Commonly known as:  ALPHAGAN Place 1 drop  into both eyes 2 (two) times daily.   budesonide-formoterol 80-4.5 MCG/ACT inhaler Commonly known as:  SYMBICORT Inhale 2 puffs into the lungs 2 (two) times daily.   Carboxymethylcellulose Sodium 0.25 % Soln Place 2 drops into both eyes 4 (four) times daily as needed (for dry eyes).   Combivent Respimat 20-100 MCG/ACT Aers respimat Generic drug:  Ipratropium-Albuterol Inhale 1 puff into the lungs every 6 (six) hours as needed for wheezing.   eucerin cream Apply 1 application topically See admin instructions. Apply small amount to affected area twice a day.   finasteride 5 MG tablet Commonly known as:  PROSCAR Take 5 mg by mouth daily.   glucose blood test strip Commonly known as:  Precision PCx Use as instructed,ICD-10 E11.8 check blood sugar BID and PRN   hydrochlorothiazide 25 MG tablet Commonly known as:  HYDRODIURIL Take 25 mg by mouth daily.   hydrOXYzine 50 MG tablet Commonly known as:  ATARAX/VISTARIL Take 1 tablet (50 mg total) by mouth 3 (three) times daily as needed.   levalbuterol 0.63 MG/3ML nebulizer solution Commonly known as:  XOPENEX Take 3 mLs (0.63 mg total) by nebulization every 6 (six) hours as needed for wheezing or shortness of breath.   levothyroxine 25 MCG tablet Commonly known as:  SYNTHROID Take 2 tablets (50 mcg total) by mouth daily before breakfast.   lisinopril 40 MG tablet Commonly known as:  ZESTRIL Take 40 mg by mouth daily.   metFORMIN 500 MG tablet Commonly known as:  GLUCOPHAGE Take 1 tablet (500 mg total) by mouth 2 (two) times daily with a meal.   metoprolol tartrate 50 MG tablet Commonly known as:  LOPRESSOR Take 25 mg by mouth 2 (two) times daily.   multivitamin with minerals tablet Take 1 tablet by mouth daily.   nitroGLYCERIN 0.4 MG SL tablet Commonly known as:  NITROSTAT Place 0.4 mg under the tongue every 5 (five) minutes as needed for chest pain. Dissolve one tablet under the tongue every 5 minutes as needed for  chest pain. Repeat every 5 minutes if needed for a total of 3 tablets in 15 minutes. If no relief, call 911.   olopatadine 0.1 % ophthalmic solution Commonly known as:  PATANOL Place 1 drop into both eyes 2 (two) times daily.   omeprazole 20 MG capsule Commonly known as:  PRILOSEC Take 20 mg by mouth daily. Marland Kitchen   senna-docusate 8.6-50 MG tablet Commonly known as:  Senokot-S Take 2 tablets by mouth at bedtime.   tamsulosin 0.4 MG Caps capsule Commonly known as:  FLOMAX Take 1 capsule (0.4 mg total) by mouth daily.       Discharge Instructions: Please refer to Patient Instructions section of EMR for full details.  Patient was counseled important signs and symptoms that should prompt return to medical care, changes in medications, dietary instructions, activity restrictions, and follow up appointments.   Follow-Up Appointments: Contact information for  after-discharge care    Destination    [facility] .   Service:  Skilled Nursing Contact information: [address] [place] [id number] [phone]              Emony Dormer, [place], DO 08/01/2018, 10:55 AM PGY-2, [facility]

## 2018-08-01 ENCOUNTER — Encounter: Payer: Self-pay | Admitting: Internal Medicine

## 2018-08-01 ENCOUNTER — Non-Acute Institutional Stay (SKILLED_NURSING_FACILITY): Payer: Medicare Other | Admitting: Internal Medicine

## 2018-08-01 DIAGNOSIS — I480 Paroxysmal atrial fibrillation: Secondary | ICD-10-CM

## 2018-08-01 DIAGNOSIS — R319 Hematuria, unspecified: Secondary | ICD-10-CM

## 2018-08-01 DIAGNOSIS — N390 Urinary tract infection, site not specified: Secondary | ICD-10-CM

## 2018-08-01 DIAGNOSIS — J441 Chronic obstructive pulmonary disease with (acute) exacerbation: Secondary | ICD-10-CM

## 2018-08-01 DIAGNOSIS — E039 Hypothyroidism, unspecified: Secondary | ICD-10-CM

## 2018-08-01 DIAGNOSIS — E1149 Type 2 diabetes mellitus with other diabetic neurological complication: Secondary | ICD-10-CM

## 2018-08-01 DIAGNOSIS — I10 Essential (primary) hypertension: Secondary | ICD-10-CM | POA: Diagnosis not present

## 2018-08-01 DIAGNOSIS — I6300 Cerebral infarction due to thrombosis of unspecified precerebral artery: Secondary | ICD-10-CM

## 2018-08-01 LAB — GLUCOSE, CAPILLARY: Glucose-Capillary: 112 mg/dL — ABNORMAL HIGH (ref 70–99)

## 2018-08-01 MED ORDER — AMOXICILLIN-POT CLAVULANATE 875-125 MG PO TABS: 1.0000 | ORAL_TABLET | Freq: Two times a day (BID) | ORAL | 0 refills | Status: DC

## 2018-08-01 MED ORDER — AMOXICILLIN-POT CLAVULANATE 875-125 MG PO TABS
1.0000 | ORAL_TABLET | Freq: Two times a day (BID) | ORAL | Status: DC
  Administered 2018-08-01: 1 via ORAL
  Filled 2018-08-01: qty 1

## 2018-08-01 MED ORDER — AMOXICILLIN-POT CLAVULANATE 500-125 MG PO TABS: 1.0000 | ORAL_TABLET | Freq: Two times a day (BID) | ORAL | Status: DC

## 2018-08-01 NOTE — Progress Notes (Signed)
This is an acute visit.  Level of care skilled.  Facility is [facility].  Chief complaint acute visit status post hospitalization for chest pain.  With history of recent CVA and mental status changes.  History of present illness.  Patient is a pleasant 85 year old male seen today for admission to skilled nursing status post hospitalization for chest pain complicated with a recent CVA and altered mental status.  Patient apparently had a CVA on April 15th and was readmitted on April 19 for concern with altered mental status and difficulties family had caring for patient's increased care needs at home.  Patient was recommended for skilled nursing however he was discharged home secondary to family wishes he did have home health.  Apparently most recent admission patient was grabbing chest and complaining of chest pain and he was brought to the ER to be evaluated.  He had no changes to any EKG troponins were negative x3 vital signs remained stable.  He was found to have a VRE UTI and Augmentin was started.  Recommendation is for 14-day course.  He works with therapy after hospital admission and skilled nursing was recommended and thus he has been discharged to [facility]  Currently he is lying in bed comfortably vital signs appear to be stable initially when his came here his blood pressure was slightly low at 99/71 on recheck it did come up to 112/60.  He is lying in bed comfortably he does appear to be  weak but is alert pleasant cooperative and does not express any concerns  His other diagnoses in addition to recent CVA acute left pons infarct are history of UTIs- glaucoma-atrial fibrillation as well as type 2 diabetes hyperlipidemia hypertension COPD hypothyroidism anemia and BPH.--And protein calorie malnutrition review of systems.      Patient Active Problem List   Diagnosis Date Noted  . AMS (altered mental status) 07/14/2018  . Altered mental status   . Stroke (HCC)  07/10/2018  . Requires supplemental oxygen 06/05/2018  . Chest pain 03/29/2018  . Atrial fibrillation (HCC) 05/20/2015  . RBBB 05/20/2015  . Prolonged Q-T interval on ECG 05/20/2015  . CAD (coronary artery disease) 05/20/2015  . History of CVA (cerebrovascular accident) 05/20/2015  . Atrial fibrillation with RVR (HCC)   . Bronchiectasis with acute exacerbation (HCC)   . Unstable angina (HCC) 03/27/2013  . COPD exacerbation (HCC) 03/25/2013  . Diabetes mellitus type II, uncontrolled (HCC) 03/25/2013  . Hypothyroidism 03/25/2013  . BPH (benign prostatic hyperplasia) 03/25/2013  . HTN (hypertension) 03/25/2013    Past Medical History:     Past Medical History:  Diagnosis Date  . Atrial fibrillation (HCC)   . COPD (chronic obstructive pulmonary disease) (HCC)   . CVA (cerebral infarction)   . Diabetes mellitus without complication (HCC)   . Essential hypertension, benign   . Hard of hearing     Past Surgical History:      Past Surgical History:  Procedure Laterality Date  . HERNIA REPAIR    . LEFT HEART CATHETERIZATION WITH CORONARY ANGIOGRAM N/A 03/28/2013   Procedure: LEFT HEART CATHETERIZATION WITH CORONARY ANGIOGRAM;  Surgeon: Peter M Swaziland, MD;  Location: [facility] CATH LAB;  Service: Cardiovascular;  Laterality: N/A;    Social History: Social History        Tobacco Use  . Smoking status: Former Smoker    Types: Cigarettes  . Smokeless tobacco: Former Engineer, water Use Topics  . Alcohol use: No  . Drug use: No   Additional social  history:   Please also refer to relevant sections of EMR.  Family History:      Family History  Problem Relation Age of Onset  . CAD Son   . Skin cancer Brother     Allergies and Medications: No Known Allergies No current facility-administered medications on file prior to encounter.     MEDICATIONS   albuterol 108 (90 Base) MCG/ACT inhaler Commonly known as:  VENTOLIN HFA Inhale 2 puffs into the  lungs every 4 (four) hours as needed for wheezing or shortness of breath.   amLODipine 5 MG tablet Commonly known as:  NORVASC Take 5 mg by mouth daily.   amoxicillin-clavulanate 875-125 MG tablet Commonly known as:  AUGMENTIN Take 1 tablet by mouth every 12 (twelve) hours for 14 days.   apixaban 5 MG Tabs tablet Commonly known as:  ELIQUIS Take 1 tablet (5 mg total) by mouth 2 (two) times daily.   atorvastatin 80 MG tablet Commonly known as:  LIPITOR Take 1 tablet (80 mg total) by mouth daily at 6 PM.   brimonidine 0.15 % ophthalmic solution Commonly known as:  ALPHAGAN Place 1 drop into both eyes 2 (two) times daily.   budesonide-formoterol 80-4.5 MCG/ACT inhaler Commonly known as:  SYMBICORT Inhale 2 puffs into the lungs 2 (two) times daily.   Carboxymethylcellulose Sodium 0.25 % Soln Place 2 drops into both eyes 4 (four) times daily as needed (for dry eyes).   Combivent Respimat 20-100 MCG/ACT Aers respimat Generic drug:  Ipratropium-Albuterol Inhale 1 puff into the lungs every 6 (six) hours as needed for wheezing.   eucerin cream Apply 1 application topically See admin instructions. Apply small amount to affected area twice a day.   finasteride 5 MG tablet Commonly known as:  PROSCAR Take 5 mg by mouth daily.   glucose blood test strip Commonly known as:  Precision PCx Use as instructed,ICD-10 E11.8 check blood sugar BID and PRN   hydrochlorothiazide 25 MG tablet Commonly known as:  HYDRODIURIL Take 25 mg by mouth daily.   hydrOXYzine 50 MG tablet Commonly known as:  ATARAX/VISTARIL Take 1 tablet (50 mg total) by mouth 3 (three) times daily as needed.   levalbuterol 0.63 MG/3ML nebulizer solution Commonly known as:  XOPENEX Take 3 mLs (0.63 mg total) by nebulization every 6 (six) hours as needed for wheezing or shortness of breath.   levothyroxine 25 MCG tablet Commonly known as:  SYNTHROID Take 2 tablets (50 mcg total) by mouth daily  before breakfast.   lisinopril 40 MG tablet Commonly known as:  ZESTRIL Take 40 mg by mouth daily.   metFORMIN 500 MG tablet Commonly known as:  GLUCOPHAGE Take 1 tablet (500 mg total) by mouth 2 (two) times daily with a meal.   metoprolol tartrate 25 mg Commonly known as:  LOPRESSOR Take 25 mg by mouth 2 (two) times daily.   multivitamin with minerals tablet Take 1 tablet by mouth daily.   nitroGLYCERIN 0.4 MG SL tablet Commonly known as:  NITROSTAT Place 0.4 mg under the tongue every 5 (five) minutes as needed for chest pain. Dissolve one tablet under the tongue every 5 minutes as needed for chest pain. Repeat every 5 minutes if needed for a total of 3 tablets in 15 minutes. If no relief, call 911.   olopatadine 0.1 % ophthalmic solution Commonly known as:  PATANOL Place 1 drop into both eyes 2 (two) times daily.   omeprazole 20 MG capsule Commonly known as:  PRILOSEC Take 20 mg  by mouth daily. Marland Kitchen.   senna-docusate 8.6-50 MG tablet Commonly known as:  Senokot-S Take 2 tablets by mouth at bedtime.   tamsulosin 0.4 MG Caps capsule Commonly known as:  FLOMAX Take 1 capsule (0.4 mg total) by mouth daily.       Review of systems.  This is limited secondary to patient being hard of hearing.  Provided by nursing as well.  In general is not complaining of any fever or chills.  Skin does not complain of itching or rashes.  Head ears eyes nose mouth and throat does have a history of a coma is not complaining of visual changes.  Respiratory denies shortness of breath or cough.  Cardiac denies chest pain does not appear to have significant edema.  GI is not complaining of abdominal discomfort nausea or vomiting diarrhea or constipation.  GU being treated for UTI does not really complain of dysuria.  Musculoskeletal does not complain of joint pain does have significant weakness.  Neurologic does not complain of dizziness or syncope or headache.  And  psych does not appear to be overtlydepressed or anxious although he has quite a hard of hearing which makes assessment difficult   Physical exam.  Updated vital signs temperature 98.8 pulse 64 respirations of 16 blood pressure 112/60 O2 saturation is 90% on room air.  In general this is a pleasant elderly male who appears weak but comfortable.  His skin is warm and dry.  Eyes visual acuity appears grossly intact sclera and conjunctive are clear.  Ears he is a hard of hearing.  Oropharynx is clear mucous membranes appear fairly moist.  Chest is clear to auscultation with somewhat shallow air entry I cannot appreciate any overt congestion.  There is no labored breathing.  Heart is somewhat distant heart sounds from what I could auscultate and per radial pulses regular rate and rhythm he does not really have significant edema.  Abdomen is soft nontender with positive bowel sounds.  Musculoskeletal Limited exam since he is in bed but is able to move all extremities x4 is able to lift his legs against gravity and flex and extend somewhat at the knees.  Neurologic is grossly intact his speech is clear he does not speak a whole lot this is limited secondary to his hearing difficulties.  And psych again difficult full assessment he is pleasant appropriate follows simple verbal commands and answer straightforward questions fairly appropriately  \Labs.  Jul 30, 2017.  WBC 6.4 hemoglobin 11.8 platelets 209.  Sodium 140 potassium 4.1 BUN 21 creatinine 1.23 liver function test within normal limits except albumin of 3.1   Assessment and plan.   #1 UTI positive for VRE t -- There are orders for 14 days of Augmentin at this point will monitor  he appears to be comfortable he is afebrile.  2.  Chest pain again work-up in the hospital was negative for acute process negative troponin EKG . was unremarkable he is not currently complaining of chest pain at this point will monitor--he does  have orders for nitroglycerin as needed  #3- history of recent CVA- continues on atorvastatin and Eliquis patient apparently does have some underlying confusion apparently neurologically is at baseline--apparently has some baseline confusion.  4.-  History of atrial fibrillation continues on Eliquis Metroprolol this appears rate controlled at this point.  5.  History of type 2 diabetes hemoglobin A1c was 7.9 on April lab he is on Glucophage at this point will monitor blood sugars notify provider if CBG  less than 60 or greater than 250.  6.-  History of hyperlipidemia he is on atorvastatin LDL was 150 on hospital lab in April at this point will monitor. One would wonder about possible compliance at home.  7.-  History of protein calorie malnutrition he would benefit from a dietary consult albumin was 3.1 on hospital lab.  8.  History of glaucoma with eye irritation [facility] he is on topical eyedrops Olopatadine-brimonidine .  9.  History of COPD he continues on.  Albuterol inhaler as needed- well his Symbicort twice daily-Combivent every 6 hours as needed as well as Xopenex nebulizers as needed--at this point appears to be stable I do not really note any wheezing or discomfort on exam   d 10.  Hypothyroidism he is on Synthroid 50 mcg a day TSH was 2.57 on April lab.  11.  History of anemia hemoglobin of 11.8 on May 6 appears to be relatively baseline with previous levels.  12.  History of BPH he continues on Proscar and Flomax.  13.  Hypertension he continues on numerous medications including Norvasc 5 mg a day--lisinopril 40 mg a day- Lopressor 25 mg twice daily in addition to hydrochlorothiazide 25 mg a day- have spoken with nursing and will reduce this to 12.5 mg-there is a recommendation and discharge summary to decrease this if his blood pressure run somewhat low and when he came here his systolic was 99 And it did rise to 112 later   14 Protein calorie  malnutrition would benefit from dietary consult with recommendation for supplements  CPT- 99310-of note greater 45 minutes spent assessing patient-discussing his status with nursing staff-reviewing his chart and labs- and coordinating and formulating a plan of care-of note greater than 50% of time spent coordinating a plan of care with input as noted above

## 2018-08-01 NOTE — TOC Progression Note (Signed)
Transition of Care [facility]) - Progression Note    Patient Details  Name: [name] MRN: [id number] Date of Birth: 1928-03-25  Transition of Care [facility]) CM/SW Contact  Margarito Liner, LCSW Phone Number: 08/01/2018, 10:06 AM  Clinical Narrative: Called son and reviewed bed offers and CMS Medicare scores. He has chosen [facility]. They do not require another COVID test. Admissions coordinator is waiting on "second approval" and is aware he is stable for discharge today.   Expected Discharge Plan: Skilled Nursing Facility Barriers to Discharge: Continued Medical Work up  Expected Discharge Plan and Services Expected Discharge Plan: Skilled Nursing Facility     Post Acute Care Choice: Skilled Nursing Facility Living arrangements for the past 2 months: Single Family Home                                       Social Determinants of Health (SDOH) Interventions    Readmission Risk Interventions Readmission Risk Prevention Plan 08/01/2018  PCP or Specialist appointment within 3-5 days of discharge Complete  HRI or Home Care Consult (No Data)  SW Recovery Care/Counseling Consult Complete  Palliative Care Screening Not Applicable  Skilled Nursing Facility Complete  Some recent data might be hidden

## 2018-08-01 NOTE — Progress Notes (Signed)
Patient alert oriented x2, denies pain, v/s stable. Iv and tele d/c. Report given to [facility] nurse receiving the patient all question answered. Patient d/c to SNF via ambulance per order.

## 2018-08-01 NOTE — Progress Notes (Addendum)
Family Medicine Teaching Service Daily Progress Note Intern Pager: [phone]  Patient name: [name] Cottman Medical record number: [id number] Date of birth: 08/10/1927 Age: 85 y.o. Gender: male  Primary Care Provider: Bing NeighborsHarris, Kimberly S, FNP Consultants: SW Code Status: DNR   Pt Overview and Major Events to Date:  Hospital Day: 2 07/31/2018: admitted for Chest Pain   Assessment and Plan: [name] Mefferd is a 85 y.o. male who presented w/ CP and admitted for ACS rule out. Work up was negative for cardiac cause of CP and has since resolved. This patient is medically stable for discharge once placement has been secured by CSW.  PMHx includes A fib w/ RVR, BPH, HTN, RBBB, CAD, Stroke, Malnutrition, HOH   #Complicated UTI Urine culture returned with > 100k VRE sensitive to macrobid, levofloxacin and ampicillin.   Augmentin 875 mg BID x 14 days   # Chest Pain, resolved Known CAD, EKG unchanged, troponins negative x4. No longer having CP.  Patient's disposition is pending SNF placement.  Patient is otherwise medically stable for discharge. Patient is agreeable for SNF.   Discharge pending SNF placement  # Recent Stroke Patient has some confusion at baseline.  He does not have any neurologic deficits from his recent stroke.  Will encourage patient to continue to work with PT while inpatient  Continue PT  # Malnutrition   Appreciate nutrition recs  # A. Fib #RBBB On Eliquis and metoprolol at home.  Pulse ranging from 61-68.  SR this morning on EKG, no changes.  Continue home Eliquis and metoprolol  Telemetry  #Diabetes, well controlled  No CBGs, no sliding scale  #HLD   Continue home atorvastatin  #Hypertension Ranging from 113/55-160 9/59.  Continue home lisinopril, HCTZ, Norvasc.  #COPD Symbicort at home.  Albuterol as needed, Dulera daily  #Hypothyroidism   Continue home Synthroid  #BPH  Continue home finasteride and Proscar  #FEN/GI: Carb modified  diet VTE prophylaxis: Chronically Anticoagulated   Disposition: SNF      Subjective:  NAEO.   Objective: Temp:  [97.5 F (36.4 C)-98.4 F (36.9 C)] 97.7 F (36.5 C) (05/07 0346) Pulse Rate:  [61-68] 63 (05/07 0346) Cardiac Rhythm: Normal sinus rhythm (05/06 1902) Resp:  [18-19] 18 (05/07 0346) BP: (113-155)/(45-77) 124/77 (05/07 0346) SpO2:  [98 %-100 %] 100 % (05/07 0346) Weight:  [68.9 kg] 68.9 kg (05/07 0346) 05/06 0701 - 05/07 0700 In: 960 [P.O.:960] Out: 1750 [Urine:1750]   Physical Exam: General: NAD, non-toxic, well-appearing, sitting comfortably in bed.  Cardiovascular: RRR, normal S1, S2. 2+ RP bilaterally. No BLEE Respiratory: CTAB. No IWOB.  Abdomen: + BS. NT, ND, soft to palpation.  Neuro: A & O x 1. CN intact.   Laboratory: I have personally read and reviewed all labs and imaging studies.  CBC: Recent Labs  Lab 07/28/18 2338 07/31/18 0210  WBC 7.2 6.4  NEUTROABS 4.7 4.1  HGB 11.4* 11.8*  HCT 36.8* 37.0*  MCV 92.0 90.2  PLT 204 209   CMP: Recent Labs  Lab 07/28/18 2338 07/31/18 0210  NA 140 140  K 4.0 4.1  CL 109 110  CO2 24 22  GLUCOSE 170* 168*  BUN 23 21  CREATININE 1.21 1.23  CALCIUM 9.1 9.3  ALBUMIN  --  3.1*     CBG: Recent Labs  Lab 07/31/18 0546 07/31/18 1137  GLUCAP 215* 172*   Imaging/Diagnostic Tests: Dg Chest Port 1 View  Result Date: 07/31/2018 CLINICAL DATA:  Chest pain EXAM: PORTABLE CHEST 1 VIEW  COMPARISON:  07/19/2018 FINDINGS: Heart is upper limits normal is size. Bibasilar linear densities, likely scarring. No acute confluent opacities or effusions. No acute bony abnormality. IMPRESSION: Chronic changes.  No active disease. Electronically Signed   By: Charlett Nose M.D.   On: 07/31/2018 01:55    EKG Interpretation  Date/Time:  Wednesday Jul 31 2018 01:26:20 EDT Ventricular Rate:  73 PR Interval:    QRS Duration: 144 QT Interval:  393 QTC Calculation: 433 R Axis:   -82 Text Interpretation:  Sinus rhythm  Ventricular trigeminy RBBB and LAFB When compared with ECG of 07/24/2018, No significant change was found Confirmed by Dione Booze ([id number]) on 07/31/2018 1:34:10 AM       Melene Plan, MD 08/01/2018, 6:14 AM PGY-1, [facility] Family Medicine FPTS Intern pager: [phone], text pages welcome

## 2018-08-01 NOTE — TOC Transition Note (Signed)
Transition of Care [facility]) - CM/SW Discharge Note   Patient Details  Name: [name] MRN: [id number] Date of Birth: [birth year]  [facility]) CM/SW Contact:  Margarito Liner, LCSW Phone Number: 08/01/2018, 11:28 AM   Clinical Narrative:  CSW facilitated patient discharge including contacting patient family and facility to confirm patient discharge plans. Clinical information faxed to facility and family agreeable with plan. CSW arranged ambulance transport via [facility] to [place] at 12:30. RN to call report prior to discharge [phone]).  CSW will sign off for now as social work intervention is no longer needed. Please consult [place] again if new needs arise.  Final next level of care: Skilled Nursing Facility Barriers to Discharge: Barriers Resolved   Patient Goals and CMS Choice Patient states their goals for this hospitalization and ongoing recovery are:: Patient not fully oriented. CMS Medicare.gov Compare Post Acute Care list provided to:: Patient Represenative (must comment)(Son verbally over the phone.) Choice offered to / list presented to : Adult Children  Discharge Placement   Existing PASRR number confirmed : 07/31/18          Patient chooses bed at: [facility] Patient to be transferred to facility by: [facility] Name of family member notified: Merril Mccusker Patient and family notified of of transfer: 08/01/18  Discharge Plan and Services     Post Acute Care Choice: Skilled Nursing Facility                               Social Determinants of Health (SDOH) Interventions     Readmission Risk Interventions Readmission Risk Prevention Plan 08/01/2018  PCP or Specialist appointment within 3-5 days of discharge Complete  HRI or Home Care Consult (No Data)  SW Recovery Care/Counseling Consult Complete  Palliative Care Screening Not Applicable  Skilled Nursing Facility Complete  Some recent data might be hidden

## 2018-08-02 LAB — NOVEL CORONAVIRUS, NAA: SARS-CoV-2, NAA: NEGATIVE

## 2018-08-05 ENCOUNTER — Encounter: Payer: Self-pay | Admitting: Internal Medicine

## 2018-08-05 ENCOUNTER — Non-Acute Institutional Stay (SKILLED_NURSING_FACILITY): Payer: Medicare Other | Admitting: Internal Medicine

## 2018-08-05 DIAGNOSIS — I10 Essential (primary) hypertension: Secondary | ICD-10-CM | POA: Diagnosis not present

## 2018-08-05 DIAGNOSIS — I4891 Unspecified atrial fibrillation: Secondary | ICD-10-CM | POA: Diagnosis not present

## 2018-08-05 DIAGNOSIS — R4689 Other symptoms and signs involving appearance and behavior: Secondary | ICD-10-CM

## 2018-08-05 DIAGNOSIS — Z87440 Personal history of urinary (tract) infections: Secondary | ICD-10-CM | POA: Diagnosis not present

## 2018-08-05 MED ORDER — LORAZEPAM 0.5 MG PO TABS: 0.2500 mg | ORAL_TABLET | Freq: Two times a day (BID) | ORAL | 0 refills | Status: DC | PRN

## 2018-08-05 NOTE — Progress Notes (Signed)
Location:    [facility] Room Number: 304/A Place of Service:  SNF [phone]) Provider:  Darrell Jewel, FNP  Patient Care Team: Bing Neighbors, FNP as PCP - General (Family Medicine) Jens Som Madolyn Frieze, MD as PCP - Cardiology (Cardiology)  Extended Emergency Contact Information Primary Emergency Contact: kavontae, pritchard Mobile Phone: [phone] Relation: Son Secondary Emergency Contact: Marshall,Taylor Mobile Phone: [phone] Relation: Daughter  Code Status:  DNR Goals of care: Advanced Directive information Advanced Directives 08/05/2018  Does Patient Have a Medical Advance Directive? Yes  Type of Advance Directive Out of facility DNR (pink MOST or yellow form)  Does patient want to make changes to medical advance directive? No - Patient declined  Copy of Healthcare Power of Attorney in Chart? No - copy requested  Would patient like information on creating a medical advance directive? No - Patient declined  Pre-existing out of facility DNR order (yellow form or pink MOST form) -     Chief Complaint  Patient presents with   Acute Visit    Wandering Episodes     HPI:  Pt is a 85 y.o. male seen today for an acute visit for follow-up of wandering episodes confusion.  He is a recent admission to the facility--patient initially was admitted to the hospital for CVA on April 15-he was readmitted on April 19 for altered mental status and difficulty family had caring for him at home  He was recommended for skilled nursing however he was discharged home secondary to family wishes and he did have home health.  However apparently he complained of chest pain at home and was brought to the ER- he had no changes to his EKG troponins were negative x3 vital signs were stable  He was found to have a UTI positive for VRE and Augmentin was started which he is on for 14-day course.  Apparently over the weekend he did have some  wandering episodes and confusion-apparently he does have a order for Vistaril as needed but apparently this is not helping much.  His vital signs appear to be stable today he does not have any complaints he is a poor historian secondary to cognitive status   Past Medical History:  Diagnosis Date   Atrial fibrillation (HCC)    COPD (chronic obstructive pulmonary disease) (HCC)    CVA (cerebral infarction)    Diabetes mellitus without complication (HCC)    Essential hypertension, benign    Hard of hearing    Past Surgical History:  Procedure Laterality Date   HERNIA REPAIR     LEFT HEART CATHETERIZATION WITH CORONARY ANGIOGRAM N/A 03/28/2013   Procedure: LEFT HEART CATHETERIZATION WITH CORONARY ANGIOGRAM;  Surgeon: Peter M Swaziland, MD;  Location: [facility] CATH LAB;  Service: Cardiovascular;  Laterality: N/A;    No Known Allergies  Outpatient Encounter Medications as of 08/05/2018  Medication Sig   albuterol (PROVENTIL HFA;VENTOLIN HFA) 108 (90 Base) MCG/ACT inhaler Inhale 2 puffs into the lungs every 4 (four) hours as needed for wheezing or shortness of breath.   amLODipine (NORVASC) 5 MG tablet Take 5 mg by mouth daily.   apixaban (ELIQUIS) 5 MG TABS tablet Take 1 tablet (5 mg total) by mouth 2 (two) times daily.   atorvastatin (LIPITOR) 80 MG tablet Take 1 tablet (80 mg total) by mouth daily at 6 PM.   brimonidine (ALPHAGAN) 0.15 % ophthalmic solution Place 1 drop into both eyes 2 (two) times daily.  budesonide-formoterol (SYMBICORT) 80-4.5 MCG/ACT inhaler Inhale 2 puffs into the lungs 2 (two) times daily.   Carboxymethylcellulose Sodium 0.25 % SOLN Place 2 drops into both eyes 4 (four) times daily as needed (for dry eyes).    finasteride (PROSCAR) 5 MG tablet Take 5 mg by mouth daily.   hydrochlorothiazide (HYDRODIURIL) 25 MG tablet Take 12.5 mg by mouth daily.    hydrOXYzine (ATARAX/VISTARIL) 50 MG tablet Take 1 tablet (50 mg total) by mouth 3 (three) times daily as  needed.   Ipratropium-Albuterol (COMBIVENT RESPIMAT) 20-100 MCG/ACT AERS respimat Inhale 1 puff into the lungs every 6 (six) hours as needed for wheezing.    levalbuterol (XOPENEX) 0.63 MG/3ML nebulizer solution Take 3 mLs (0.63 mg total) by nebulization every 6 (six) hours as needed for wheezing or shortness of breath.   levothyroxine (SYNTHROID, LEVOTHROID) 25 MCG tablet Take 2 tablets (50 mcg total) by mouth daily before breakfast.   lisinopril (PRINIVIL,ZESTRIL) 40 MG tablet Take 40 mg by mouth daily.   metFORMIN (GLUCOPHAGE) 500 MG tablet Take 1 tablet (500 mg total) by mouth 2 (two) times daily with a meal.   metoprolol (LOPRESSOR) 50 MG tablet Take 25 mg by mouth 2 (two) times daily.   Multiple Vitamins-Minerals (MULTIVITAMIN WITH MINERALS) tablet Take 1 tablet by mouth daily.   nitroGLYCERIN (NITROSTAT) 0.4 MG SL tablet Place 0.4 mg under the tongue every 5 (five) minutes as needed for chest pain. Dissolve one tablet under the tongue every 5 minutes as needed for chest pain. Repeat every 5 minutes if needed for a total of 3 tablets in 15 minutes. If no relief, call 911.   olopatadine (PATANOL) 0.1 % ophthalmic solution Place 1 drop into both eyes 2 (two) times daily.   omeprazole (PRILOSEC) 20 MG capsule Take 20 mg by mouth daily. Marland Kitchen   senna-docusate (SENOKOT-S) 8.6-50 MG tablet Take 2 tablets by mouth at bedtime.   Skin Protectants, Misc. (MINERIN) CREA Apply topically. APPLY TO TRUNK AND LEGS TWICE A DAY   tamsulosin (FLOMAX) 0.4 MG CAPS capsule Take 1 capsule (0.4 mg total) by mouth daily.   [DISCONTINUED] amoxicillin-clavulanate (AUGMENTIN) 875-125 MG tablet Take 1 tablet by mouth every 12 (twelve) hours for 14 days.   [DISCONTINUED] glucose blood (PRECISION PCX) test strip Use as instructed,ICD-10 E11.8 check blood sugar BID and PRN   [DISCONTINUED] Skin Protectants, Misc. (EUCERIN) cream Apply 1 application topically 2 (two) times a day. Apply small amount to affected  area twice a day.    No facility-administered encounter medications on file as of 08/05/2018.     Review of Systems   This is limited secondary to patient does have some confusion apparently this is not new  In general not complaining of fever chills says he feels fine.  Skin is not complain of rashes or itching.  Head ears eyes nose mouth and throat is not complaining of visual changes or sore throat.  Staff has noted some erythema of his conjunctiva  Respiratory is not complaining of shortness of breath or cough.  Cardiac does not complain of chest pain or edema.  GI is not complaining of stomach pain nausea or vomiting has been noted --nor constipation or diarrhea  GU is not complaining of dysuria he is being treated for UTI   Musculoskeletal is not complaining of joint pain does have weakness but per staff is ambulatory  Neurologic is not complaining of dizziness or headache or syncope he does have some weakness   Psych he appears to  have some element dementia he is pleasant cooperative with exam does not complain of being depressed or anything bothering him   There is no immunization history on file for this patient. Pertinent  Health Maintenance Due  Topic Date Due   FOOT EXAM  09/01/2018 (Originally 09/25/1937)   OPHTHALMOLOGY EXAM  09/01/2018 (Originally 09/25/1937)   PNA vac Low Risk Adult (1 of 2 - PCV13) 09/01/2018 (Originally 09/25/1994)   INFLUENZA VACCINE  10/26/2018   HEMOGLOBIN A1C  01/09/2019   Fall Risk  07/22/2018  Falls in the past year? 0   Functional Status Survey:     Temperature is 98.1 pulse is 60 respirations 18 blood pressure 110/68 oxygen saturation is 93% on room air Physical Exam General this is a pleasant elderly male in no distress he is lying comfortably in bed  His skin is warm and dry he does have numerous solar induced changes.  Eyes visual acuity appears grossly intact pupils are reactive to light sclera and conjunctive are  somewhat erythematous  Oropharynx is clear mucous membranes appear fairly moist.  Chest is clear to auscultation shallow air entry.  There is no labored breathing  Heart distant heart sounds largely regular rate and rhythm with an occasional skipped beat- does not have significant lower extremity edema  Abdomen is soft nontender with positive bowel sounds.  GU cannot really appreciate suprapubic tenderness  Musculoskeletal is able to move all extremities x4 strength appears to be at baseline he still is somewhat generally weak.  Neurologic as noted above could not appreciate lateralizing findings his speech is clear  Psych he is oriented to self he is pleasant follows simple verbal commands- cannot tell me the day of week the month year or who the president is   Labs reviewed: Recent Labs    02/02/18 0513 02/03/18 0540  07/24/18 0459 07/28/18 2338 07/31/18 0210  NA 140 136   < > 140 140 140  K 3.5 4.1   < > 4.1 4.0 4.1  CL 106 108   < > 105 109 110  CO2 25 22   < > 26 24 22   GLUCOSE 225* 159*   < > 125* 170* 168*  BUN 20 16   < > 21 23 21   CREATININE 1.27* 0.94   < > 1.43* 1.21 1.23  CALCIUM 8.9 8.4*   < > 9.4 9.1 9.3  MG 2.0 1.6*  --   --   --   --   PHOS  --  2.7  --   --   --   --    < > = values in this interval not displayed.   Recent Labs    07/14/18 0301 07/24/18 0459 07/31/18 0210  AST 29 19 15   ALT 13 17 15   ALKPHOS 41 51 43  BILITOT 0.6 0.7 0.6  PROT 6.1* 6.2* 5.6*  ALBUMIN 3.6 3.8 3.1*   Recent Labs    07/24/18 0459 07/28/18 2338 07/31/18 0210  WBC 5.7 7.2 6.4  NEUTROABS 3.4 4.7 4.1  HGB 13.0 11.4* 11.8*  HCT 41.1 36.8* 37.0*  MCV 90.5 92.0 90.2  PLT 216 204 209   Lab Results  Component Value Date   TSH 2.574 07/11/2018   Lab Results  Component Value Date   HGBA1C 7.9 (H) 07/10/2018   Lab Results  Component Value Date   CHOL 226 (H) 07/10/2018   HDL 43 07/10/2018   LDLCALC 150 (H) 07/10/2018   TRIG 165 (H) 07/10/2018  CHOLHDL  5.3 07/10/2018    Significant Diagnostic Results in last 30 days:  Ct Angio Head W Or Wo Contrast  Result Date: 07/10/2018 CLINICAL DATA:  Slurred speech EXAM: CT ANGIOGRAPHY HEAD AND NECK TECHNIQUE: Multidetector CT imaging of the head and neck was performed using the standard protocol during bolus administration of intravenous contrast. Multiplanar CT image reconstructions and MIPs were obtained to evaluate the vascular anatomy. Carotid stenosis measurements (when applicable) are obtained utilizing NASCET criteria, using the distal internal carotid diameter as the denominator. CONTRAST:  34mL OMNIPAQUE IOHEXOL 350 MG/ML SOLN COMPARISON:  Head CT same day FINDINGS: CTA NECK FINDINGS Aortic arch: Atherosclerotic irregularity of the aortic arch. Branching pattern is normal with left vertebral artery arising from the arch. No origin stenosis. Right carotid system: Common carotid artery widely patent to the bifurcation region. Calcified plaque at the carotid bifurcation and ICA bulb. No stenosis. Cervical ICA widely patent beyond that. Left carotid system: Common carotid artery widely patent to the bifurcation. Soft and calcified plaque at the carotid bifurcation and ICA bulb. Minimal diameter at the distal bulb is 4.5 mm. Compared to a more distal cervical ICA diameter of 5 mm, this indicates a 10% stenosis. Vertebral arteries: Both vertebral arteries are widely patent. As noted above, the left vertebral artery arises from the arch. Skeleton: Ordinary cervical spondylosis. Other neck: No mass or lymphadenopathy. Upper chest: Emphysema and pulmonary scarring.  No active process. Review of the MIP images confirms the above findings CTA HEAD FINDINGS Anterior circulation: Right internal carotid artery is patent through the siphon region. There is atherosclerotic calcification but no narrowing greater than 30%. Right ICA supplies primarily the right middle cerebral artery territory. There is a diminutive A1 segment.  No large or medium vessel occlusion identified. On the left, the ICA is patent through the siphon region with calcification but no stenosis greater than 30%. Dominant A1 segment is widely patent. Left M1 segment is patent and normal. Superior division of the left middle cerebral artery is presumably chronically occluded, serving the region of old infarction. No sign of acute embolic occlusion. Posterior circulation: Both vertebral arteries are patent through the foramen magnum to the basilar. No basilar stenosis. Posterior circulation branch vessels are patent. Venous sinuses: Patent and normal. Anatomic variants: None significant otherwise. Delayed phase: No abnormal enhancement. Review of the MIP images confirms the above findings IMPRESSION: Atherosclerotic change of the aorta with marked irregularity. Atherosclerotic change at both carotid bifurcations. No stenosis on the right. 10% stenosis of the distal bulb on the left. No acute intracranial large or medium vessel occlusion. 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 Electronically Signed   By: Paulina Fusi M.D.   On: 07/10/2018 12:32   Dg Chest 2 View  Result Date: 07/10/2018 CLINICAL DATA:  Altered mental status.  Stroke. EXAM: CHEST - 2 VIEW COMPARISON:  06/05/2018 FINDINGS: Artifact overlies the chest. Heart size is at the upper limits of normal. There is aortic atherosclerosis. The lungs are clear except for mild scarring. No infiltrate, collapse or effusion. IMPRESSION: No active cardiopulmonary disease. Electronically Signed   By: Paulina Fusi M.D.   On: 07/10/2018 13:27   Ct Head Wo Contrast  Result Date: 07/14/2018 CLINICAL DATA:  85 y/o  M; altered mental status. History of CVA. EXAM: CT HEAD WITHOUT CONTRAST TECHNIQUE: Contiguous axial images were obtained from the base of the skull through the vertex without intravenous  contrast. COMPARISON:   07/10/2018 CT of head and MRI of head. FINDINGS: Brain: Small lucency within the left hemi pons corresponding to infarction on prior MRI of the head is stable in distribution. Chronic infarcts are present within the left superior cerebellar hemisphere in the left MCA distribution. No new finding of stroke, hemorrhage, extra-axial collection, hydrocephalus, or herniation. Background of mild chronic microvascular ischemic changes and moderate volume loss of the brain. Vascular: Calcific atherosclerosis of the carotid siphons. No hyperdense vessel identified. Skull: Normal. Negative for fracture or focal lesion. Sinuses/Orbits: Mild frontal and ethmoid sinus mucosal thickening. Normal aeration of mastoid air cells. Bilateral intra-ocular lens replacement. Other: None. IMPRESSION: 1. No new acute intracranial abnormality identified. 2. Small lucency within left hemi pons corresponding to recent infarction on prior MRI is stable in distribution. 3. Stable chronic infarcts within left superior cerebellar hemisphere and left MCA distribution. 4. Stable chronic microvascular ischemic changes and volume loss of the brain. Electronically Signed   By: Mitzi Hansen M.D.   On: 07/14/2018 03:33   Ct Angio Neck W Or Wo Contrast  Result Date: 07/10/2018 CLINICAL DATA:  Slurred speech EXAM: CT ANGIOGRAPHY HEAD AND NECK TECHNIQUE: Multidetector CT imaging of the head and neck was performed using the standard protocol during bolus administration of intravenous contrast. Multiplanar CT image reconstructions and MIPs were obtained to evaluate the vascular anatomy. Carotid stenosis measurements (when applicable) are obtained utilizing NASCET criteria, using the distal internal carotid diameter as the denominator. CONTRAST:  75mL OMNIPAQUE IOHEXOL 350 MG/ML SOLN COMPARISON:  Head CT same day FINDINGS: CTA NECK FINDINGS Aortic arch: Atherosclerotic irregularity of the aortic arch. Branching pattern is normal with left  vertebral artery arising from the arch. No origin stenosis. Right carotid system: Common carotid artery widely patent to the bifurcation region. Calcified plaque at the carotid bifurcation and ICA bulb. No stenosis. Cervical ICA widely patent beyond that. Left carotid system: Common carotid artery widely patent to the bifurcation. Soft and calcified plaque at the carotid bifurcation and ICA bulb. Minimal diameter at the distal bulb is 4.5 mm. Compared to a more distal cervical ICA diameter of 5 mm, this indicates a 10% stenosis. Vertebral arteries: Both vertebral arteries are widely patent. As noted above, the left vertebral artery arises from the arch. Skeleton: Ordinary cervical spondylosis. Other neck: No mass or lymphadenopathy. Upper chest: Emphysema and pulmonary scarring.  No active process. Review of the MIP images confirms the above findings CTA HEAD FINDINGS Anterior circulation: Right internal carotid artery is patent through the siphon region. There is atherosclerotic calcification but no narrowing greater than 30%. Right ICA supplies primarily the right middle cerebral artery territory. There is a diminutive A1 segment. No large or medium vessel occlusion identified. On the left, the ICA is patent through the siphon region with calcification but no stenosis greater than 30%. Dominant A1 segment is widely patent. Left M1 segment is patent and normal. Superior division of the left middle cerebral artery is presumably chronically occluded, serving the region of old infarction. No sign of acute embolic occlusion. Posterior circulation: Both vertebral arteries are patent through the foramen magnum to the basilar. No basilar stenosis. Posterior circulation branch vessels are patent. Venous sinuses: Patent and normal. Anatomic variants: None significant otherwise. Delayed phase: No abnormal enhancement. Review of the MIP images confirms the above findings IMPRESSION: Atherosclerotic change of the aorta with  marked irregularity. Atherosclerotic change at both carotid bifurcations. No stenosis on the right. 10% stenosis of the distal bulb on  the left. No acute intracranial large or medium vessel occlusion. 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 Electronically Signed   By: Paulina Fusi M.D.   On: 07/10/2018 12:32   Mr Brain Wo Contrast  Result Date: 07/10/2018 CLINICAL DATA:  Stroke follow-up EXAM: MRI HEAD WITHOUT CONTRAST TECHNIQUE: Multiplanar, multiecho pulse sequences of the brain and surrounding structures were obtained without intravenous contrast. COMPARISON:  CTA head neck 07/10/2018 FINDINGS: BRAIN: Small focus of abnormal diffusion restriction within the left pons. No other diffusion abnormality. The midline structures are normal. Old posterior left MCA territory infarct. Multifocal white matter hyperintensity, most commonly due to chronic ischemic microangiopathy. Generalized atrophy without lobar predilection. Susceptibility-sensitive sequences show no chronic microhemorrhage or superficial siderosis. No mass lesion. VASCULAR: The major intracranial arterial and venous sinus flow voids are normal. SKULL AND UPPER CERVICAL SPINE: Calvarial bone marrow signal is normal. There is no skull base mass. Visualized upper cervical spine and soft tissues are normal. SINUSES/ORBITS: No fluid levels or advanced mucosal thickening. No mastoid or middle ear effusion. The orbits are normal. IMPRESSION: 1. Small acute or early subacute infarct the left pons, in keeping with reported right-sided weakness. No acute hemorrhage. 2. Old posterior left MCA territory infarct encephalomalacia. 3. Generalized atrophy. Electronically Signed   By: Deatra Robinson M.D.   On: 07/10/2018 16:04   Dg Chest Port 1 View  Result Date: 07/31/2018 CLINICAL DATA:  Chest pain EXAM: PORTABLE CHEST 1 VIEW COMPARISON:  07/19/2018 FINDINGS: Heart is  upper limits normal is size. Bibasilar linear densities, likely scarring. No acute confluent opacities or effusions. No acute bony abnormality. IMPRESSION: Chronic changes.  No active disease. Electronically Signed   By: Charlett Nose M.D.   On: 07/31/2018 01:55   Dg Chest Port 1 View  Result Date: 07/19/2018 CLINICAL DATA:  Chest pain EXAM: PORTABLE CHEST 1 VIEW COMPARISON:  1920 FINDINGS: The lungs are hyperinflated with diffuse interstitial prominence. No focal airspace consolidation or pulmonary edema. No pleural effusion or pneumothorax. Normal cardiomediastinal contours. There is bibasilar atelectasis. IMPRESSION: COPD without acute airspace disease.  Bibasilar atelectasis. Electronically Signed   By: Deatra Robinson M.D.   On: 07/19/2018 00:32   Dg Chest Portable 1 View  Result Date: 07/14/2018 CLINICAL DATA:  85 y/o  M; episodes of altered mental status. EXAM: PORTABLE CHEST 1 VIEW COMPARISON:  07/10/2018 chest radiograph. FINDINGS: Normal cardiac silhouette. Aortic atherosclerosis with calcification. Stable chronic interstitial changes, hyperinflated lungs, emphysema. No focal consolidation. No pleural effusion or pneumothorax. No acute osseous abnormality is evident. IMPRESSION: Stable findings of COPD.  No acute pulmonary process identified. Electronically Signed   By: Mitzi Hansen M.D.   On: 07/14/2018 03:30   Dg Swallowing Func-speech Pathology  Result Date: 07/11/2018 Objective Swallowing Evaluation: Type of Study: MBS-Modified Barium Swallow Study  Patient Details Name: SELDON BARRELL MRN: [id number] Date of Birth: [birth year] Today's Date: 07/11/2018 Time: SLP Start Time (ACUTE ONLY): 1250 -SLP Stop Time (ACUTE ONLY): 1306 SLP Time Calculation (min) (ACUTE ONLY): 16 min Past Medical History: Past Medical History: Diagnosis Date  Atrial fibrillation (HCC)   COPD (chronic obstructive pulmonary disease) (HCC)   CVA (cerebral infarction)   Diabetes mellitus without complication  (HCC)   Essential hypertension, benign   Hard of hearing  Past Surgical History: Past Surgical History: Procedure Laterality Date  HERNIA REPAIR    LEFT HEART CATHETERIZATION WITH CORONARY ANGIOGRAM N/A 03/28/2013  Procedure:  LEFT HEART CATHETERIZATION WITH CORONARY ANGIOGRAM;  Surgeon: Peter M Swaziland, MD;  Location: [facility] CATH LAB;  Service: Cardiovascular;  Laterality: N/A; HPI: Pt is an 85 y.o. male who presented with R sided weakness and speech difficulties. PMH is significant for Afib on eliquis, COPD, DM, HTN, CAD, h/o CVA. 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  Subjective: alert, communicative Assessment / Plan / Recommendation CHL IP CLINICAL IMPRESSIONS 07/11/2018 Clinical Impression Pt presents with functional oropharyngeal swallow marked by prolonged, piece-meal bolusing of regular solids (ultimately effective); adequate propulsion of all materials through pharynx with no residue post-swallow.  There was adequate laryngeal vestibule closure with only occasional penetration of thin liquids (PAS #2).  Penetration did not reach the vocal folds; there was no aspiration.  13 mm barium pill passed readily without deficit.  Recommend resuming a regular diet with thin liquids; pills whole with liquid.  No SLP f/u is needed.  SLP Visit Diagnosis Dysphagia, unspecified (R13.10) Attention and concentration deficit following -- Frontal lobe and executive function deficit following -- Impact on safety and function No limitations   CHL IP TREATMENT RECOMMENDATION 07/11/2018 Treatment Recommendations No treatment recommended at this time   No flowsheet data found. CHL IP DIET RECOMMENDATION 07/11/2018 SLP Diet Recommendations Regular solids;Thin liquid Liquid Administration via Cup;Straw Medication Administration Whole meds with liquid Compensations -- Postural Changes --   CHL IP OTHER RECOMMENDATIONS 07/11/2018  Recommended Consults -- Oral Care Recommendations Oral care BID Other Recommendations Order thickener from pharmacy   CHL IP FOLLOW UP RECOMMENDATIONS 07/11/2018 Follow up Recommendations None   No flowsheet data found.     CHL IP ORAL PHASE 07/11/2018 Oral Phase Impaired Oral - Pudding Teaspoon -- Oral - Pudding Cup -- Oral - Honey Teaspoon -- Oral - Honey Cup -- Oral - Nectar Teaspoon -- Oral - Nectar Cup -- Oral - Nectar Straw -- Oral - Thin Teaspoon -- Oral - Thin Cup -- Oral - Thin Straw -- Oral - Puree -- Oral - Mech Soft -- Oral - Regular Piecemeal swallowing;Delayed oral transit Oral - Multi-Consistency -- Oral - Pill -- Oral Phase - Comment --  CHL IP PHARYNGEAL PHASE 07/11/2018 Pharyngeal Phase WFL Pharyngeal- Pudding Teaspoon -- Pharyngeal -- Pharyngeal- Pudding Cup -- Pharyngeal -- Pharyngeal- Honey Teaspoon -- Pharyngeal -- Pharyngeal- Honey Cup -- Pharyngeal -- Pharyngeal- Nectar Teaspoon -- Pharyngeal -- Pharyngeal- Nectar Cup -- Pharyngeal -- Pharyngeal- Nectar Straw -- Pharyngeal -- Pharyngeal- Thin Teaspoon -- Pharyngeal -- Pharyngeal- Thin Cup -- Pharyngeal -- Pharyngeal- Thin Straw -- Pharyngeal -- Pharyngeal- Puree -- Pharyngeal -- Pharyngeal- Mechanical Soft -- Pharyngeal -- Pharyngeal- Regular -- Pharyngeal -- Pharyngeal- Multi-consistency -- Pharyngeal -- Pharyngeal- Pill -- Pharyngeal -- Pharyngeal Comment --  [facility] IP CERVICAL ESOPHAGEAL PHASE 07/11/2018 Cervical Esophageal Phase WFL Pudding Teaspoon -- Pudding Cup -- Honey Teaspoon -- Honey Cup -- Nectar Teaspoon -- Nectar Cup -- Nectar Straw -- Thin Teaspoon -- Thin Cup -- Thin Straw -- Puree -- Mechanical Soft -- Regular -- Multi-consistency -- Pill -- Cervical Esophageal Comment -- Blenda Mounts Laurice 07/11/2018, 1:15 PM              Ct Head Code Stroke Wo Contrast  Result Date: 07/10/2018 CLINICAL DATA:  Code stroke.  Slurred speech EXAM: CT HEAD WITHOUT CONTRAST TECHNIQUE: Contiguous axial images were obtained from the base of the  skull through the vertex without intravenous contrast. COMPARISON:  06/13/2017 FINDINGS:  Brain: No brainstem abnormality is seen. Old left cerebellar infarction. Old infarction in the left MCA territory with atrophy, encephalomalacia and gliosis of the deep insula, posterior temporal lobe and parietal lobe. No sign of acute infarction, mass lesion, hemorrhage, hydrocephalus or extra-axial collection Vascular: There is atherosclerotic calcification of the major vessels at the base of the brain. Skull: Negative Sinuses/Orbits: Clear/normal Other: Left parotid mass as seen previously. ASPECTS [facility] Stroke Program Early CT Score) - Ganglionic level infarction (caudate, lentiform nuclei, internal capsule, insula, M1-M3 cortex): No acute insult identifiable. Extensive old infarct on the left. - Supraganglionic infarction (M4-M6 cortex): A old infarct on the left. Total score (0-10 with 10 being normal): No acute lesion suspected. Difficult to apply given the extensive old left MCA infarction. IMPRESSION: 1. No acute finding. No apparent change since March of last year. Old infarction in the left MCA territory. 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 2. ASPECTS is difficult to apply given the extensive old left MCA stroke. 3. These results were communicated to Dr. Pearlean Brownie at 12:20 pmon 4/15/2020by text page via the [facility] messaging system. Electronically Signed   By: Paulina Fusi M.D.   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 Alwyn Ren via phone  2 history of atrial fibrillation he is on Eliquis and metoprolol for rate control this appears to be stable I note occasionally slightly bradycardic readings he is asymptomatic at this point will monitor.  3.  Hypertension-he is on numerous agents there was ordered to possibly reduce his hydrochlorothiazide if blood pressures were running low which initially seemed to be the case he is now on hydrochlorothiazide 12.5 mg in addition to lisinopril 40 mg a day and Lopressor 25 twice daily and Norvasc 5 mg a day blood pressure this morning was the 110/68 previous blood pressure I see was a systolic actually around 140 at this point will monitor would like to avoid hypotension especially with his wandering episodes  4 history of type 2 diabetes --continues on Glucophage blood sugar this morning was 156 will continue to monitor  6 history of glaucoma with a history of glaucoma he continues on topical eyedrops will add artificial tears for irritation apparently he did have some irritation apparent in the hospital as well and was on artificial tears at that time--monitor for any changes such as increased drainage or visual changes  [phone]

## 2018-08-06 ENCOUNTER — Encounter: Payer: Self-pay | Admitting: Adult Health

## 2018-08-06 ENCOUNTER — Non-Acute Institutional Stay (SKILLED_NURSING_FACILITY): Payer: Medicare Other | Admitting: Adult Health

## 2018-08-06 DIAGNOSIS — N400 Enlarged prostate without lower urinary tract symptoms: Secondary | ICD-10-CM

## 2018-08-06 DIAGNOSIS — N390 Urinary tract infection, site not specified: Secondary | ICD-10-CM | POA: Diagnosis not present

## 2018-08-06 DIAGNOSIS — E1165 Type 2 diabetes mellitus with hyperglycemia: Secondary | ICD-10-CM

## 2018-08-06 DIAGNOSIS — I10 Essential (primary) hypertension: Secondary | ICD-10-CM

## 2018-08-06 DIAGNOSIS — E039 Hypothyroidism, unspecified: Secondary | ICD-10-CM

## 2018-08-06 DIAGNOSIS — J441 Chronic obstructive pulmonary disease with (acute) exacerbation: Secondary | ICD-10-CM

## 2018-08-06 DIAGNOSIS — R319 Hematuria, unspecified: Secondary | ICD-10-CM

## 2018-08-06 DIAGNOSIS — F419 Anxiety disorder, unspecified: Secondary | ICD-10-CM | POA: Diagnosis not present

## 2018-08-06 DIAGNOSIS — E875 Hyperkalemia: Secondary | ICD-10-CM

## 2018-08-06 LAB — CBC AND DIFFERENTIAL
HCT: 41 (ref 41–53)
Hemoglobin: 13.8 (ref 13.5–17.5)
Neutrophils Absolute: 4
Platelets: 213 (ref 150–399)
WBC: 6.4

## 2018-08-06 LAB — BASIC METABOLIC PANEL
BUN: 27 — AB (ref 4–21)
Creatinine: 1.2 (ref 0.6–1.3)
Glucose: 140
Potassium: 5.9 — AB (ref 3.4–5.3)
Sodium: 138 (ref 137–147)

## 2018-08-06 NOTE — Progress Notes (Addendum)
Location:  [facility] Room Number: 304-A Place of Service:  SNF (31) Provider:  Kenard Gower, DNP, FNP-BC  Patient Care Team: Bing Neighbors, FNP as PCP - General (Family Medicine) Jens Som Madolyn Frieze, MD as PCP - Cardiology (Cardiology)  Extended Emergency Contact Information Primary Emergency Contact: cainen, ricco Mobile Phone: [phone] Relation: Son Secondary Emergency Contact: Marshall,Taylor Mobile Phone: [phone] Relation: Daughter  Code Status:  DNR  Goals of care: Advanced Directive information Advanced Directives 08/05/2018  Does Patient Have a Medical Advance Directive? Yes  Type of Advance Directive Out of facility DNR (pink MOST or yellow form)  Does patient want to make changes to medical advance directive? No - Patient declined  Copy of Healthcare Power of Attorney in Chart? No - copy requested  Would patient like information on creating a medical advance directive? No - Patient declined  Pre-existing out of facility DNR order (yellow form or pink MOST form) -     Chief Complaint  Patient presents with   Acute Visit    Patient recently started on Ativan, followup on efficacy.     HPI:  Pt is an 85 y.o. male seen today for an acute visit to followup on the efficacy of the Ativan dosing he was recently started on. He is a short-term rehabilitation resident of [facility]. He has a PMH of DM2, atrial fibrillation, HLD, HTN, COPD, hypothyroidism, and BPH. His daughter-in-law visited and saw him through a glass window while on the telephone. She said that she has not seen him this confused before and would like to take care of him at home.Patient was seen in his room. He is currently on droplet and urinary precaution. He has VRE UTI and COVID-19 precaution due to being hospitalized. He was calm and not anxious. He asked for help in tying up his pants. He is alert to self, disoriented to time and place.  For discharge to be safe, Home health needed to be arranged and discussion with the healthcare team on what he would be needing for discharge. Daughter-in-law was given the option for AMA (against medical advise) if she would like to take him home this afternoon. It was later decided that he would be discharge on 08-09-18.  He was recently started on Ativan PRN for anxiety. He was reported to have a tendency to wander out of the facility. Latest lab showed K= 5.9, elevated.   Past Medical History:  Diagnosis Date   Atrial fibrillation (HCC)    COPD (chronic obstructive pulmonary disease) (HCC)    CVA (cerebral infarction)    Diabetes mellitus without complication (HCC)    Essential hypertension, benign    Hard of hearing    Past Surgical History:  Procedure Laterality Date   HERNIA REPAIR     LEFT HEART CATHETERIZATION WITH CORONARY ANGIOGRAM N/A 03/28/2013   Procedure: LEFT HEART CATHETERIZATION WITH CORONARY ANGIOGRAM;  Surgeon: Peter M Swaziland, MD;  Location: [facility] CATH LAB;  Service: Cardiovascular;  Laterality: N/A;    No Known Allergies  Outpatient Encounter Medications as of 08/06/2018  Medication Sig   albuterol (PROVENTIL HFA;VENTOLIN HFA) 108 (90 Base) MCG/ACT inhaler Inhale 2 puffs into the lungs every 4 (four) hours as needed for wheezing or shortness of breath.   amLODipine (NORVASC) 5 MG tablet Take 5 mg by mouth daily.    amoxicillin-clavulanate (AUGMENTIN) 875-125 MG tablet Take 1 tablet by mouth every 12 (twelve) hours.   apixaban (ELIQUIS) 5 MG TABS tablet Take  1 tablet (5 mg total) by mouth 2 (two) times daily.   atorvastatin (LIPITOR) 80 MG tablet Take 1 tablet (80 mg total) by mouth daily at 6 PM.   brimonidine (ALPHAGAN) 0.15 % ophthalmic solution Place 1 drop into both eyes 2 (two) times daily.    budesonide-formoterol (SYMBICORT) 80-4.5 MCG/ACT inhaler Inhale 2 puffs into the lungs 2 (two) times daily.    Carboxymethylcellulose Sodium 0.25 % SOLN Place 2  drops into both eyes 4 (four) times daily as needed (for dry eyes).    finasteride (PROSCAR) 5 MG tablet Take 5 mg by mouth daily.    hydrochlorothiazide (HYDRODIURIL) 25 MG tablet Take 12.5 mg by mouth daily.    Ipratropium-Albuterol (COMBIVENT RESPIMAT) 20-100 MCG/ACT AERS respimat Inhale 1 puff into the lungs every 6 (six) hours as needed for wheezing.    levalbuterol (XOPENEX) 0.63 MG/3ML nebulizer solution Take 3 mLs (0.63 mg total) by nebulization every 6 (six) hours as needed for wheezing or shortness of breath.   levothyroxine (SYNTHROID, LEVOTHROID) 25 MCG tablet Take 2 tablets (50 mcg total) by mouth daily before breakfast.   lisinopril (PRINIVIL,ZESTRIL) 40 MG tablet Take 40 mg by mouth daily.   LORazepam (ATIVAN) 0.5 MG tablet Take 0.5 tablets (0.25 mg total) by mouth 2 (two) times daily as needed for anxiety. For Anxiety for 7 days   metFORMIN (GLUCOPHAGE) 500 MG tablet Take 1 tablet (500 mg total) by mouth 2 (two) times daily with a meal.   Multiple Vitamins-Minerals (MULTIVITAMIN WITH MINERALS) tablet Take 1 tablet by mouth daily.   nitroGLYCERIN (NITROSTAT) 0.4 MG SL tablet Place 0.4 mg under the tongue every 5 (five) minutes as needed for chest pain. Dissolve one tablet under the tongue every 5 minutes as needed for chest pain. Repeat every 5 minutes if needed for a total of 3 tablets in 15 minutes. If no relief, call 911.   olopatadine (PATANOL) 0.1 % ophthalmic solution Place 1 drop into both eyes 2 (two) times daily.   omeprazole (PRILOSEC) 20 MG capsule Take 20 mg by mouth daily. Marland Kitchen.   senna (SENOKOT) 8.6 MG TABS tablet Take 2 tablets by mouth at bedtime.   Skin Protectants, Misc. (MINERIN) CREA Apply topically. APPLY TO TRUNK AND LEGS TWICE A DAY   tamsulosin (FLOMAX) 0.4 MG CAPS capsule Take 1 capsule (0.4 mg total) by mouth daily.   [DISCONTINUED] hydrOXYzine (ATARAX/VISTARIL) 50 MG tablet Take 1 tablet (50 mg total) by mouth 3 (three) times daily as needed.    [DISCONTINUED] metoprolol (LOPRESSOR) 50 MG tablet Take 25 mg by mouth 2 (two) times daily.   [DISCONTINUED] senna-docusate (SENOKOT-S) 8.6-50 MG tablet Take 2 tablets by mouth at bedtime.   No facility-administered encounter medications on file as of 08/06/2018.     Review of Systems  Unable to obtain due to cognitive deficit    Immunization History  Administered Date(s) Administered   Influenza-Unspecified 12/25/2017   Pertinent  Health Maintenance Due  Topic Date Due   FOOT EXAM  09/01/2018 (Originally 09/25/1937)   OPHTHALMOLOGY EXAM  09/01/2018 (Originally 09/25/1937)   PNA vac Low Risk Adult (1 of 2 - PCV13) 09/01/2018 (Originally 09/25/1994)   INFLUENZA VACCINE  10/26/2018   HEMOGLOBIN A1C  01/09/2019   Fall Risk  07/22/2018  Falls in the past year? 0     Vitals:   08/06/18 1547  BP: 130/79  Pulse: 74  Resp: 20  Temp: 98.8 F (37.1 C)  TempSrc: Oral  SpO2: 90%  Weight: 160 lb (  72.6 kg)  Height:  (1.727 m)   Body mass index is 24.33 kg/m.  Physical Exam  GENERAL APPEARANCE: Well nourished. In no acute distress. Normal body habitus SKIN:  Skin is warm and dry.  MOUTH and THROAT: Lips are without lesions. Oral mucosa is moist and without lesions. Tongue is normal in shape, size, and color and without lesions RESPIRATORY: Breathing is even & unlabored, BS CTAB CARDIAC: RRR, no murmur,no extra heart sounds, no edema GI: Abdomen soft, normal BS, no masses, no tenderness EXTREMITIES:  Able to move X 4 extremities NEUROLOGICAL: There is no tremor. Speech is clear. Alert to self, disoriented to time and place. PSYCHIATRIC:  Affect and behavior are appropriate   Labs reviewed: Recent Labs    02/02/18 0513 02/03/18 0540  07/24/18 0459 07/28/18 2338 07/31/18 0210  NA 140 136   < > 140 140 140  K 3.5 4.1   < > 4.1 4.0 4.1  CL 106 108   < > 105 109 110  CO2 25 22   < > GLUCOSE 225* 159*   < > 125* 170* 168*  BUN 20 16   < > CREATININE 1.27* 0.94   < > 1.43* 1.21 1.23  CALCIUM 8.9 8.4*   < > 9.4 9.1 9.3  MG 2.0 1.6*  --   --   --   --   PHOS  --  2.7  --   --   --   --    < > = values in this interval not displayed.   Recent Labs    07/14/18 0301 07/24/18 0459 07/31/18 0210  AST ALT ALKPHOS 41 51 43  BILITOT 0.6 0.7 0.6  PROT 6.1* 6.2* 5.6*  ALBUMIN 3.6 3.8 3.1*   Recent Labs    07/24/18 0459 07/28/18 2338 07/31/18 0210  WBC 5.7 7.2 6.4  NEUTROABS 3.4 4.7 4.1  HGB 13.0 11.4* 11.8*  HCT 41.1 36.8* 37.0*  MCV 90.5 92.0 90.2  PLT 216 204 209   Lab Results  Component Value Date   TSH 2.574 07/11/2018   Lab Results  Component Value Date   HGBA1C 7.9 (H) 07/10/2018   Lab Results  Component Value Date   CHOL 226 (H) 07/10/2018   HDL 43 07/10/2018   LDLCALC 150 (H) 07/10/2018   TRIG 165 (H) 07/10/2018   CHOLHDL 5.3 07/10/2018    Significant Diagnostic Results in last 30 days:  Ct Angio Head W Or Wo Contrast  Result Date: 07/10/2018 CLINICAL DATA:  Slurred speech EXAM: CT ANGIOGRAPHY HEAD AND NECK TECHNIQUE: Multidetector CT imaging of the head and neck was performed using the standard protocol during bolus administration of intravenous contrast. Multiplanar CT image reconstructions and MIPs were obtained to evaluate the vascular anatomy. Carotid stenosis measurements (when applicable) are obtained utilizing NASCET criteria, using the distal internal carotid diameter as the denominator. CONTRAST:  75mL OMNIPAQUE IOHEXOL 350 MG/ML SOLN COMPARISON:  Head CT same day FINDINGS: CTA NECK FINDINGS Aortic arch: Atherosclerotic irregularity of the aortic arch. Branching pattern is normal with left vertebral artery arising from the arch. No origin stenosis. Right carotid system: Common carotid artery widely patent to the bifurcation region. Calcified plaque at the carotid bifurcation and ICA bulb. No stenosis. Cervical ICA widely patent beyond that. Left carotid system: Common  carotid artery widely patent to the bifurcation. Soft and calcified plaque at the carotid  bifurcation and ICA bulb. Minimal diameter at the distal bulb is 4.5 mm. Compared to a more distal cervical ICA diameter of 5 mm, this indicates a 10% stenosis. Vertebral arteries: Both vertebral arteries are widely patent. As noted above, the left vertebral artery arises from the arch. Skeleton: Ordinary cervical spondylosis. Other neck: No mass or lymphadenopathy. Upper chest: Emphysema and pulmonary scarring.  No active process. Review of the MIP images confirms the above findings CTA HEAD FINDINGS Anterior circulation: Right internal carotid artery is patent through the siphon region. There is atherosclerotic calcification but no narrowing greater than 30%. Right ICA supplies primarily the right middle cerebral artery territory. There is a diminutive A1 segment. No large or medium vessel occlusion identified. On the left, the ICA is patent through the siphon region with calcification but no stenosis greater than 30%. Dominant A1 segment is widely patent. Left M1 segment is patent and normal. Superior division of the left middle cerebral artery is presumably chronically occluded, serving the region of old infarction. No sign of acute embolic occlusion. Posterior circulation: Both vertebral arteries are patent through the foramen magnum to the basilar. No basilar stenosis. Posterior circulation branch vessels are patent. Venous sinuses: Patent and normal. Anatomic variants: None significant otherwise. Delayed phase: No abnormal enhancement. Review of the MIP images confirms the above findings IMPRESSION: Atherosclerotic change of the aorta with marked irregularity. Atherosclerotic change at both carotid bifurcations. No stenosis on the right. 10% stenosis of the distal bulb on the left. No acute intracranial large or medium vessel occlusion. Presumably chronic occlusion of the superior division left MCA M2 branch, serving the  region of the old left MCA territory infarction. Electronically Signed   By: Paulina Fusi M.D.   On: 07/10/2018 12:32   Dg Chest 2 View  Result Date: 07/10/2018 CLINICAL DATA:  Altered mental status.  Stroke. EXAM: CHEST - 2 VIEW COMPARISON:  06/05/2018 FINDINGS: Artifact overlies the chest. Heart size is at the upper limits of normal. There is aortic atherosclerosis. The lungs are clear except for mild scarring. No infiltrate, collapse or effusion. IMPRESSION: No active cardiopulmonary disease. Electronically Signed   By: Paulina Fusi M.D.   On: 07/10/2018 13:27   Ct Head Wo Contrast  Result Date: 07/14/2018 CLINICAL DATA:  85 y/o  M; altered mental status. History of CVA. EXAM: CT HEAD WITHOUT CONTRAST TECHNIQUE: Contiguous axial images were obtained from the base of the skull through the vertex without intravenous contrast. COMPARISON:  07/10/2018 CT of head and MRI of head. FINDINGS: Brain: Small lucency within the left hemi pons corresponding to infarction on prior MRI of the head is stable in distribution. Chronic infarcts are present within the left superior cerebellar hemisphere in the left MCA distribution. No new finding of stroke, hemorrhage, extra-axial collection, hydrocephalus, or herniation. Background of mild chronic microvascular ischemic changes and moderate volume loss of the brain. Vascular: Calcific atherosclerosis of the carotid siphons. No hyperdense vessel identified. Skull: Normal. Negative for fracture or focal lesion. Sinuses/Orbits: Mild frontal and ethmoid sinus mucosal thickening. Normal aeration of mastoid air cells. Bilateral intra-ocular lens replacement. Other: None. IMPRESSION: 1. No new acute intracranial abnormality identified. 2. Small lucency within left hemi pons corresponding to recent infarction on prior MRI is stable in distribution. 3. Stable chronic infarcts within left superior cerebellar hemisphere and left MCA distribution. 4. Stable chronic microvascular  ischemic changes and volume loss of the brain. Electronically Signed   By: Mitzi Hansen M.D.   On: 07/14/2018 03:33  Ct Angio Neck W Or Wo Contrast  Result Date: 07/10/2018 CLINICAL DATA:  Slurred speech EXAM: CT ANGIOGRAPHY HEAD AND NECK TECHNIQUE: Multidetector CT imaging of the head and neck was performed using the standard protocol during bolus administration of intravenous contrast. Multiplanar CT image reconstructions and MIPs were obtained to evaluate the vascular anatomy. Carotid stenosis measurements (when applicable) are obtained utilizing NASCET criteria, using the distal internal carotid diameter as the denominator. CONTRAST:  75mL OMNIPAQUE IOHEXOL 350 MG/ML SOLN COMPARISON:  Head CT same day FINDINGS: CTA NECK FINDINGS Aortic arch: Atherosclerotic irregularity of the aortic arch. Branching pattern is normal with left vertebral artery arising from the arch. No origin stenosis. Right carotid system: Common carotid artery widely patent to the bifurcation region. Calcified plaque at the carotid bifurcation and ICA bulb. No stenosis. Cervical ICA widely patent beyond that. Left carotid system: Common carotid artery widely patent to the bifurcation. Soft and calcified plaque at the carotid bifurcation and ICA bulb. Minimal diameter at the distal bulb is 4.5 mm. Compared to a more distal cervical ICA diameter of 5 mm, this indicates a 10% stenosis. Vertebral arteries: Both vertebral arteries are widely patent. As noted above, the left vertebral artery arises from the arch. Skeleton: Ordinary cervical spondylosis. Other neck: No mass or lymphadenopathy. Upper chest: Emphysema and pulmonary scarring.  No active process. Review of the MIP images confirms the above findings CTA HEAD FINDINGS Anterior circulation: Right internal carotid artery is patent through the siphon region. There is atherosclerotic calcification but no narrowing greater than 30%. Right ICA supplies primarily the right middle  cerebral artery territory. There is a diminutive A1 segment. No large or medium vessel occlusion identified. On the left, the ICA is patent through the siphon region with calcification but no stenosis greater than 30%. Dominant A1 segment is widely patent. Left M1 segment is patent and normal. Superior division of the left middle cerebral artery is presumably chronically occluded, serving the region of old infarction. No sign of acute embolic occlusion. Posterior circulation: Both vertebral arteries are patent through the foramen magnum to the basilar. No basilar stenosis. Posterior circulation branch vessels are patent. Venous sinuses: Patent and normal. Anatomic variants: None significant otherwise. Delayed phase: No abnormal enhancement. Review of the MIP images confirms the above findings IMPRESSION: Atherosclerotic change of the aorta with marked irregularity. Atherosclerotic change at both carotid bifurcations. No stenosis on the right. 10% stenosis of the distal bulb on the left. No acute intracranial large or medium vessel occlusion. 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 Electronically Signed   By: Paulina Fusi M.D.   On: 07/10/2018 12:32   Mr Brain Wo Contrast  Result Date: 07/10/2018 CLINICAL DATA:  Stroke follow-up EXAM: MRI HEAD WITHOUT CONTRAST TECHNIQUE: Multiplanar, multiecho pulse sequences of the brain and surrounding structures were obtained without intravenous contrast. COMPARISON:  CTA head neck 07/10/2018 FINDINGS: BRAIN: Small focus of abnormal diffusion restriction within the left pons. No other diffusion abnormality. The midline structures are normal. Old posterior left MCA territory infarct. Multifocal white matter hyperintensity, most commonly due to chronic ischemic microangiopathy. Generalized atrophy without lobar predilection. Susceptibility-sensitive sequences show no chronic  microhemorrhage or superficial siderosis. No mass lesion. VASCULAR: The major intracranial arterial and venous sinus flow voids are normal. SKULL AND UPPER CERVICAL SPINE: Calvarial bone marrow signal is normal. There is no skull base mass. Visualized upper cervical spine and soft tissues are normal.  SINUSES/ORBITS: No fluid levels or advanced mucosal thickening. No mastoid or middle ear effusion. The orbits are normal. IMPRESSION: 1. Small acute or early subacute infarct the left pons, in keeping with reported right-sided weakness. No acute hemorrhage. 2. Old posterior left MCA territory infarct encephalomalacia. 3. Generalized atrophy. Electronically Signed   By: Deatra Robinson M.D.   On: 07/10/2018 16:04   Dg Chest Port 1 View  Result Date: 07/31/2018 CLINICAL DATA:  Chest pain EXAM: PORTABLE CHEST 1 VIEW COMPARISON:  07/19/2018 FINDINGS: Heart is upper limits normal is size. Bibasilar linear densities, likely scarring. No acute confluent opacities or effusions. No acute bony abnormality. IMPRESSION: Chronic changes.  No active disease. Electronically Signed   By: Charlett Nose M.D.   On: 07/31/2018 01:55   Dg Chest Port 1 View  Result Date: 07/19/2018 CLINICAL DATA:  Chest pain EXAM: PORTABLE CHEST 1 VIEW COMPARISON:  1920 FINDINGS: The lungs are hyperinflated with diffuse interstitial prominence. No focal airspace consolidation or pulmonary edema. No pleural effusion or pneumothorax. Normal cardiomediastinal contours. There is bibasilar atelectasis. IMPRESSION: COPD without acute airspace disease.  Bibasilar atelectasis. Electronically Signed   By: Deatra Robinson M.D.   On: 07/19/2018 00:32   Dg Chest Portable 1 View  Result Date: 07/14/2018 CLINICAL DATA:  85 y/o  M; episodes of altered mental status. EXAM: PORTABLE CHEST 1 VIEW COMPARISON:  07/10/2018 chest radiograph. FINDINGS: Normal cardiac silhouette. Aortic atherosclerosis with calcification. Stable chronic interstitial changes, hyperinflated  lungs, emphysema. No focal consolidation. No pleural effusion or pneumothorax. No acute osseous abnormality is evident. IMPRESSION: Stable findings of COPD.  No acute pulmonary process identified. Electronically Signed   By: Mitzi Hansen M.D.   On: 07/14/2018 03:30   Dg Swallowing Func-speech Pathology  Result Date: 07/11/2018 Objective Swallowing Evaluation: Type of Study: MBS-Modified Barium Swallow Study  Patient Details Name: JAIVON VANBEEK MRN: [id number] Date of Birth: 03-Jun-1927 Today's Date: 07/11/2018 Time: SLP Start Time (ACUTE ONLY): 1250 -SLP Stop Time (ACUTE ONLY): 1306 SLP Time Calculation (min) (ACUTE ONLY): 16 min Past Medical History: Past Medical History: Diagnosis Date  Atrial fibrillation (HCC)   COPD (chronic obstructive pulmonary disease) (HCC)   CVA (cerebral infarction)   Diabetes mellitus without complication (HCC)   Essential hypertension, benign   Hard of hearing  Past Surgical History: Past Surgical History: Procedure Laterality Date  HERNIA REPAIR    LEFT HEART CATHETERIZATION WITH CORONARY ANGIOGRAM N/A 03/28/2013  Procedure: LEFT HEART CATHETERIZATION WITH CORONARY ANGIOGRAM;  Surgeon: Peter M Swaziland, MD;  Location: [facility] CATH LAB;  Service: Cardiovascular;  Laterality: N/A; HPI: Pt is an 85 y.o. male who presented with R sided weakness and speech difficulties. PMH is significant for Afib on eliquis, COPD, DM, HTN, CAD, h/o CVA. 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  Subjective: alert, communicative Assessment / Plan / Recommendation CHL IP CLINICAL IMPRESSIONS 07/11/2018 Clinical Impression Pt presents with functional oropharyngeal swallow marked by prolonged, piece-meal bolusing of regular solids (ultimately effective); adequate propulsion of all materials through pharynx with no residue post-swallow.  There was adequate laryngeal vestibule closure with only  occasional penetration of thin liquids (PAS #2).  Penetration did not reach the vocal folds; there was no aspiration.  13 mm barium pill passed readily without deficit.  Recommend resuming a regular diet with thin liquids; pills whole with liquid.  No SLP f/u is needed.  SLP Visit Diagnosis Dysphagia, unspecified (  R13.10) Attention and concentration deficit following -- Frontal lobe and executive function deficit following -- Impact on safety and function No limitations   CHL IP TREATMENT RECOMMENDATION 07/11/2018 Treatment Recommendations No treatment recommended at this time   No flowsheet data found. CHL IP DIET RECOMMENDATION 07/11/2018 SLP Diet Recommendations Regular solids;Thin liquid Liquid Administration via Cup;Straw Medication Administration Whole meds with liquid Compensations -- Postural Changes --   CHL IP OTHER RECOMMENDATIONS 07/11/2018 Recommended Consults -- Oral Care Recommendations Oral care BID Other Recommendations Order thickener from pharmacy   CHL IP FOLLOW UP RECOMMENDATIONS 07/11/2018 Follow up Recommendations None   No flowsheet data found.     CHL IP ORAL PHASE 07/11/2018 Oral Phase Impaired Oral - Pudding Teaspoon -- Oral - Pudding Cup -- Oral - Honey Teaspoon -- Oral - Honey Cup -- Oral - Nectar Teaspoon -- Oral - Nectar Cup -- Oral - Nectar Straw -- Oral - Thin Teaspoon -- Oral - Thin Cup -- Oral - Thin Straw -- Oral - Puree -- Oral - Mech Soft -- Oral - Regular Piecemeal swallowing;Delayed oral transit Oral - Multi-Consistency -- Oral - Pill -- Oral Phase - Comment --  CHL IP PHARYNGEAL PHASE 07/11/2018 Pharyngeal Phase WFL Pharyngeal- Pudding Teaspoon -- Pharyngeal -- Pharyngeal- Pudding Cup -- Pharyngeal -- Pharyngeal- Honey Teaspoon -- Pharyngeal -- Pharyngeal- Honey Cup -- Pharyngeal -- Pharyngeal- Nectar Teaspoon -- Pharyngeal -- Pharyngeal- Nectar Cup -- Pharyngeal -- Pharyngeal- Nectar Straw -- Pharyngeal -- Pharyngeal- Thin Teaspoon -- Pharyngeal -- Pharyngeal- Thin Cup --  Pharyngeal -- Pharyngeal- Thin Straw -- Pharyngeal -- Pharyngeal- Puree -- Pharyngeal -- Pharyngeal- Mechanical Soft -- Pharyngeal -- Pharyngeal- Regular -- Pharyngeal -- Pharyngeal- Multi-consistency -- Pharyngeal -- Pharyngeal- Pill -- Pharyngeal -- Pharyngeal Comment --  CHL IP CERVICAL ESOPHAGEAL PHASE 07/11/2018 Cervical Esophageal Phase WFL Pudding Teaspoon -- Pudding Cup -- Honey Teaspoon -- Honey Cup -- Nectar Teaspoon -- Nectar Cup -- Nectar Straw -- Thin Teaspoon -- Thin Cup -- Thin Straw -- Puree -- Mechanical Soft -- Regular -- Multi-consistency -- Pill -- Cervical Esophageal Comment -- Blenda Mounts Laurice 07/11/2018, 1:15 PM              Ct Head Code Stroke Wo Contrast  Result Date: 07/10/2018 CLINICAL DATA:  Code stroke.  Slurred speech EXAM: CT HEAD WITHOUT CONTRAST TECHNIQUE: Contiguous axial images were obtained from the base of the skull through the vertex without intravenous contrast. COMPARISON:  06/13/2017 FINDINGS: Brain: No brainstem abnormality is seen. Old left cerebellar infarction. Old infarction in the left MCA territory with atrophy, encephalomalacia and gliosis of the deep insula, posterior temporal lobe and parietal lobe. No sign of acute infarction, mass lesion, hemorrhage, hydrocephalus or extra-axial collection Vascular: There is atherosclerotic calcification of the major vessels at the base of the brain. Skull: Negative Sinuses/Orbits: Clear/normal Other: Left parotid mass as seen previously. ASPECTS [facility] Stroke Program Early CT Score) - Ganglionic level infarction (caudate, lentiform nuclei, internal capsule, insula, M1-M3 cortex): No acute insult identifiable. Extensive old infarct on the left. - Supraganglionic infarction (M4-M6 cortex): A old infarct on the left. Total score (0-10 with 10 being normal): No acute lesion suspected. Difficult to apply given the extensive old left MCA infarction. IMPRESSION: 1. No acute finding. No apparent change since March of last  year. Old infarction in the left MCA territory. 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 2. ASPECTS is difficult to apply given the extensive old left MCA stroke. 3. These results were communicated to Dr. Pearlean Brownie at 12:20 pmon 4/15/2020by  text page via the [facility] messaging system. Electronically Signed   By: Paulina Fusi M.D.   On: 07/10/2018 12:20    Assessment/Plan  1. Urinary tract infection with hematuria, site unspecified - continue Augmentin 875-125 mg 1 tab every 12 hours for a total of 14 days, continue contact precautions  2. Anxiety -Mood is stable, continue Ativan 0.25 mg daily PRN for a total of 14 days  3. COPD exacerbation (HCC) - No SOB, continue albuterol inhaler as needed, Combivent Respimat as needed, levalbuterol as needed, budesonide-formetarol lateral 2 puffs twice a day  4. Uncontrolled type 2 diabetes mellitus with hyperglycemia (HCC) Lab Results  Component Value Date   HGBA1C 7.9 (H) 07/10/2018  -Well-controlled, continue metformin 500 mg twice a day  5. Benign prostatic hyperplasia, unspecified whether lower urinary tract symptoms present -Denies urinary retention, continue tamsulosin 0.4 mg 1 capsule daily and finasteride 5 mg 1 tab daily  6. Essential hypertension -Well-controlled, continue amlodipine 5 mg 1 tab daily, hydrochlorothiazide 12.5 mg 1 tab daily, Lopressor 25 mg 1 tab twice a day, lisinopril 40 mg 1 tab daily  7. Hypothyroidism, unspecified type Lab Results  Component Value Date   TSH 2.574 07/11/2018  -Continue Synthroid 25 mcg 2 tabs = 50 mg daily  8.  Hyperkalemia - K 5.9, elevated, will start Kayexalate 15 g / 60 mL x 2 p.o. now and repeat BMP    Family/ staff Communication: Discussed plan of care with resident and social worker.  Labs/tests ordered:  BMP on 08/07/18  Goals of care:   Short-term rehabilitation.    Kenard Gower, DNP [facility] [phone] (Monday-Friday 8:00 a.m. - 5:00 p.m.) [phone]  (after hours)

## 2018-08-07 ENCOUNTER — Encounter: Payer: Self-pay | Admitting: Internal Medicine

## 2018-08-07 ENCOUNTER — Non-Acute Institutional Stay (SKILLED_NURSING_FACILITY): Payer: Medicare Other | Admitting: Internal Medicine

## 2018-08-07 DIAGNOSIS — R4189 Other symptoms and signs involving cognitive functions and awareness: Secondary | ICD-10-CM

## 2018-08-07 DIAGNOSIS — E1165 Type 2 diabetes mellitus with hyperglycemia: Secondary | ICD-10-CM

## 2018-08-07 DIAGNOSIS — I4891 Unspecified atrial fibrillation: Secondary | ICD-10-CM

## 2018-08-07 DIAGNOSIS — R072 Precordial pain: Secondary | ICD-10-CM

## 2018-08-07 DIAGNOSIS — N390 Urinary tract infection, site not specified: Secondary | ICD-10-CM | POA: Diagnosis not present

## 2018-08-07 DIAGNOSIS — B952 Enterococcus as the cause of diseases classified elsewhere: Secondary | ICD-10-CM

## 2018-08-07 DIAGNOSIS — R29818 Other symptoms and signs involving the nervous system: Secondary | ICD-10-CM

## 2018-08-07 DIAGNOSIS — E039 Hypothyroidism, unspecified: Secondary | ICD-10-CM

## 2018-08-07 NOTE — Assessment & Plan Note (Addendum)
Monitor for medication associated diarrhea.  Probiotic while on Augmentin. 5/13 no GU complaints

## 2018-08-07 NOTE — Assessment & Plan Note (Signed)
A1c current as of 07/10/2018 with a value of 7.9%.  A1c goal is less than 8 based on age and multiple comorbidities.  Creatinine is high normal at 1.23 and GFR slightly reduced at 52.  BMET will need to be monitored as he is on metformin.

## 2018-08-07 NOTE — Patient Instructions (Signed)
See assessment and plan under each diagnosis in the problem list and acutely for this visit 

## 2018-08-07 NOTE — Assessment & Plan Note (Addendum)
07/31/2018 EKG revealed sinus rhythm with coupling  due to unifocal PVCs.  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  Albuterol dosage/duplication increases risk of ectopy potentially.  Albuterol MDI will be discontinued.

## 2018-08-07 NOTE — Progress Notes (Signed)
NURSING HOME LOCATION:  [facility] ROOM NUMBER:  [id number]  CODE STATUS:  DNR  PCP:  Bing NeighborsHarris, Kimberly S, FNP  [address] [place] [id number]  This is a comprehensive admission note to [facility] performed on this date less than 30 days from date of admission.  Included are preadmission medical/surgical history; reconciled medication list; family history; social history and comprehensive review of systems.   Corrections and additions to the records were documented. Comprehensive physical exam was also performed. Additionally a clinical summary was entered for each active diagnosis pertinent to this admission in the Problem List to enhance continuity of care.  HPI: Patient was hospitalized 5/6- 08/01/2018 presenting from home with chest pain.  The patient had a recent stroke on 4/15 and was readmitted subsequently on 4/19 with altered mental status and inability of the family to care for the patient at home.  SNF was recommended but family requested home health care instead. Work-up for ACS was negative with no changes noted on EKG and negative troponins.VS remained stable. He was found to have a VRE UTI for which Augmentin was initiated.  Because of advanced age and comorbidities a 14-day course was planned, ending 5/21. Were hypotension to be significant, HCTZ was to be discontinued. PT and OT recommended placement SNF for rehab.  Past medical and surgical history: Includes essential hypertension, uncomplicated diabetes, COPD, and A. fib.  He had a coronary angiogram in 2015.  Social history: Reviewed; he is a former smoker.  Family history: Reviewed, noncontributory due to advanced age.   Review of systems:  Could not be completed due to dementia. Date given as "12".  He can provide neither of the month nor the year.  He could not name the president.  He had no idea why I was wearing a protective mask and face shield. PT reports he is intermittently confused.  He  had called his family last night and told them he was being held against his will.   This am he denies any active symptoms ,specifically no cardiopulmonary or GU symptoms. He rambles on about "fixing my eyes "without explanation or elaboration.  He is on brimonidine ophthalmic solution twice daily for open angle glaucoma, polyvinyl alcohol ophthalmic solution for dry eyes and olopatadine, ophthalmologic antihistamine.  Constitutional: No fever, significant weight change, fatigue  Eyes: No redness, discharge, pain, vision change despite history above ENT/mouth: No nasal congestion, purulent discharge, earache, change in hearing, sore throat  Cardiovascular: No chest pain, palpitations, paroxysmal nocturnal dyspnea, claudication, edema  Respiratory: No cough, sputum production, hemoptysis, DOE, significant snoring, apnea Gastrointestinal: No heartburn, dysphagia, abdominal pain, nausea /vomiting, rectal bleeding, melena, change in bowels Genitourinary: No dysuria, hematuria, pyuria, incontinence, nocturia Musculoskeletal: No joint stiffness, joint swelling, weakness, pain Dermatologic: No rash, pruritus, change in appearance of skin Neurologic: No dizziness, headache, syncope, seizures, numbness, tingling Psychiatric: No significant anxiety, depression, insomnia, anorexia Endocrine: No change in hair/skin/nails, excessive thirst, excessive hunger, excessive urination  Hematologic/lymphatic: No significant bruising, lymphadenopathy, abnormal bleeding Allergy/immunology: No itchy/watery eyes, significant sneezing, urticaria, angioedema  Physical exam:  Pertinent or positive findings: He appears younger than his stated age.  He has a mustache.  Dense arcus senilis is present.  He is edentulous.  Tongue is slightly dry.  Heart sounds are markedly distant, slow, and irregular.  Breath sounds are also decreased.  Bowel sounds however are hyperactive.  Pedal pulses were surprisingly good.  He has mixed  arthritic changes of the hands.  There may  be subtle asymmetry and weakness of the extremities.  The right side may be slightly weaker.  He seems to ignore the right side.  When asked to raise 3 fingers on his right hand, he raised 2 fingers on his left.  General appearance: Adequately nourished; no acute distress, increased work of breathing is present.   Lymphatic: No lymphadenopathy about the head, neck, axilla. Eyes: No conjunctival inflammation or lid edema is present. There is no scleral icterus. Ears:  External ear exam shows no significant lesions or deformities.   Nose:  External nasal examination shows no deformity or inflammation. Nasal mucosa are pink and moist without lesions, exudates Oral exam: Lips and gums are healthy appearing.There is no oropharyngeal erythema or exudate. Neck:  No thyromegaly, masses, tenderness noted.    Heart:   without gallop, murmur, click, rub.  Lungs:  without wheezes, rhonchi, rales, rubs. Abdomen: Bowel sounds are normal.  Abdomen is soft and nontender with no organomegaly, hernias, masses. GU: Deferred  Extremities:  No cyanosis, clubbing, edema. Neurologic exam:  Balance, Rhomberg, finger to nose testing could not be completed due to clinical state Skin: Warm & dry w/o tenting. No significant lesions or rash.  See clinical summary under each active problem in the Problem List with associated updated therapeutic plan

## 2018-08-07 NOTE — Assessment & Plan Note (Addendum)
TSH is current and therapeutic as of 07/11/2026 with a value of 2.57.  No change in thyroid dose but TSH should be monitored to avoid overcorrection with increased risk of ectopy.

## 2018-08-07 NOTE — Assessment & Plan Note (Signed)
His PCP can establish his baseline once he recovers from the acute issues.

## 2018-08-08 ENCOUNTER — Other Ambulatory Visit: Payer: Self-pay | Admitting: Adult Health

## 2018-08-08 ENCOUNTER — Encounter: Payer: Self-pay | Admitting: Internal Medicine

## 2018-08-08 ENCOUNTER — Other Ambulatory Visit: Payer: Self-pay | Admitting: Internal Medicine

## 2018-08-08 ENCOUNTER — Non-Acute Institutional Stay (SKILLED_NURSING_FACILITY): Payer: Medicare Other | Admitting: Internal Medicine

## 2018-08-08 DIAGNOSIS — R319 Hematuria, unspecified: Secondary | ICD-10-CM

## 2018-08-08 DIAGNOSIS — I10 Essential (primary) hypertension: Secondary | ICD-10-CM

## 2018-08-08 DIAGNOSIS — N390 Urinary tract infection, site not specified: Secondary | ICD-10-CM

## 2018-08-08 DIAGNOSIS — E118 Type 2 diabetes mellitus with unspecified complications: Secondary | ICD-10-CM

## 2018-08-08 DIAGNOSIS — I482 Chronic atrial fibrillation, unspecified: Secondary | ICD-10-CM

## 2018-08-08 DIAGNOSIS — J449 Chronic obstructive pulmonary disease, unspecified: Secondary | ICD-10-CM

## 2018-08-08 MED ORDER — AMOXICILLIN-POT CLAVULANATE 875-125 MG PO TABS: 1.0000 | ORAL_TABLET | Freq: Two times a day (BID) | ORAL | 0 refills | Status: DC

## 2018-08-08 MED ORDER — NITROGLYCERIN 0.4 MG SL SUBL: 0.4000 mg | SUBLINGUAL_TABLET | SUBLINGUAL | 0 refills | Status: DC | PRN

## 2018-08-08 MED ORDER — HYDROCHLOROTHIAZIDE 25 MG PO TABS: 12.5000 mg | ORAL_TABLET | Freq: Every day | ORAL | 0 refills | Status: DC

## 2018-08-08 MED ORDER — OLOPATADINE HCL 0.1 % OP SOLN: 1.0000 [drp] | Freq: Two times a day (BID) | OPHTHALMIC | 0 refills | Status: DC

## 2018-08-08 MED ORDER — LEVALBUTEROL HCL 0.63 MG/3ML IN NEBU: 0.6300 mg | INHALATION_SOLUTION | Freq: Four times a day (QID) | RESPIRATORY_TRACT | 0 refills | Status: DC | PRN

## 2018-08-08 MED ORDER — TAMSULOSIN HCL 0.4 MG PO CAPS: 0.4000 mg | ORAL_CAPSULE | Freq: Every day | ORAL | 0 refills | Status: DC

## 2018-08-08 MED ORDER — LEVOTHYROXINE SODIUM 25 MCG PO TABS: 50.0000 ug | ORAL_TABLET | Freq: Every day | ORAL | 0 refills | Status: DC

## 2018-08-08 MED ORDER — SACCHAROMYCES BOULARDII 250 MG PO CAPS: 250.0000 mg | ORAL_CAPSULE | Freq: Two times a day (BID) | ORAL | 0 refills | Status: DC

## 2018-08-08 MED ORDER — SENNA 8.6 MG PO TABS: 2.0000 | ORAL_TABLET | Freq: Every day | ORAL | 0 refills | Status: DC

## 2018-08-08 MED ORDER — FINASTERIDE 5 MG PO TABS: 5.0000 mg | ORAL_TABLET | Freq: Every day | ORAL | 0 refills | Status: DC

## 2018-08-08 MED ORDER — CARBOXYMETHYLCELLULOSE SODIUM 0.25 % OP SOLN: 2.0000 [drp] | Freq: Four times a day (QID) | OPHTHALMIC | 0 refills | Status: DC | PRN

## 2018-08-08 MED ORDER — AMOXICILLIN-POT CLAVULANATE 875-125 MG PO TABS: 1.0000 | ORAL_TABLET | Freq: Two times a day (BID) | ORAL | 0 refills | Status: AC

## 2018-08-08 MED ORDER — LORAZEPAM 0.5 MG PO TABS: ORAL_TABLET | ORAL | 0 refills | Status: AC

## 2018-08-08 MED ORDER — METOPROLOL TARTRATE 25 MG PO TABS: 25.0000 mg | ORAL_TABLET | Freq: Two times a day (BID) | ORAL | 0 refills | Status: DC

## 2018-08-08 MED ORDER — POLYVINYL ALCOHOL 1.4 % OP SOLN: 1.0000 [drp] | Freq: Four times a day (QID) | OPHTHALMIC | 0 refills | Status: DC

## 2018-08-08 MED ORDER — IPRATROPIUM-ALBUTEROL 20-100 MCG/ACT IN AERS: 1.0000 | INHALATION_SPRAY | Freq: Four times a day (QID) | RESPIRATORY_TRACT | 0 refills | Status: DC | PRN

## 2018-08-08 MED ORDER — METFORMIN HCL 500 MG PO TABS: 500.0000 mg | ORAL_TABLET | Freq: Two times a day (BID) | ORAL | 0 refills | Status: DC

## 2018-08-08 MED ORDER — MULTI-VITAMIN/MINERALS PO TABS: 1.0000 | ORAL_TABLET | Freq: Every day | ORAL | 0 refills | Status: DC

## 2018-08-08 MED ORDER — AMLODIPINE BESYLATE 5 MG PO TABS: 5.0000 mg | ORAL_TABLET | Freq: Every day | ORAL | 0 refills | Status: DC

## 2018-08-08 MED ORDER — APIXABAN 5 MG PO TABS: 5.0000 mg | ORAL_TABLET | Freq: Two times a day (BID) | ORAL | 0 refills | Status: DC

## 2018-08-08 MED ORDER — LORAZEPAM 0.5 MG PO TABS: 0.5000 mg | ORAL_TABLET | Freq: Two times a day (BID) | ORAL | 0 refills | Status: DC | PRN

## 2018-08-08 MED ORDER — BRIMONIDINE TARTRATE 0.15 % OP SOLN: 1.0000 [drp] | Freq: Two times a day (BID) | OPHTHALMIC | 0 refills | Status: DC

## 2018-08-08 MED ORDER — LISINOPRIL 40 MG PO TABS: 20.0000 mg | ORAL_TABLET | Freq: Every day | ORAL | 0 refills | Status: DC

## 2018-08-08 MED ORDER — OMEPRAZOLE 20 MG PO CPDR: 20.0000 mg | DELAYED_RELEASE_CAPSULE | Freq: Every day | ORAL | 0 refills | Status: DC

## 2018-08-08 MED ORDER — BUDESONIDE-FORMOTEROL FUMARATE 80-4.5 MCG/ACT IN AERO: 2.0000 | INHALATION_SPRAY | Freq: Two times a day (BID) | RESPIRATORY_TRACT | 0 refills | Status: DC

## 2018-08-08 MED ORDER — SACCHAROMYCES BOULARDII 250 MG PO CAPS: 250.0000 mg | ORAL_CAPSULE | Freq: Two times a day (BID) | ORAL | 0 refills | Status: AC

## 2018-08-08 MED ORDER — ATORVASTATIN CALCIUM 80 MG PO TABS: 80.0000 mg | ORAL_TABLET | Freq: Every day | ORAL | 0 refills | Status: DC

## 2018-08-08 NOTE — Assessment & Plan Note (Signed)
5/13 no complaints of chest pain

## 2018-08-08 NOTE — Progress Notes (Signed)
Location:    [facility]ursing Home Room Number: 304/A Place of Service:  SNF [phone])  Provider: Edmon Crape PA-C  PCP: Bing Neighbors, FNP Patient Care Team: Bing Neighbors, FNP as PCP - General (Family Medicine) Jens Som Madolyn Frieze, MD as PCP - Cardiology (Cardiology)  Extended Emergency Contact Information Primary Emergency Contact: willys, salvino Mobile Phone: [phone] Relation: Son Secondary Emergency Contact: Marshall,Taylor Mobile Phone: [phone] Relation: Daughter  Code Status: DNR Goals of care:  Advanced Directive information Advanced Directives 08/08/2018  Does Patient Have a Medical Advance Directive? Yes  Type of Advance Directive Out of facility DNR (pink MOST or yellow form)  Does patient want to make changes to medical advance directive? No - Patient declined  Copy of Healthcare Power of Attorney in Chart? No - copy requested  Would patient like information on creating a medical advance directive? No - Patient declined  Pre-existing out of facility DNR order (yellow form or pink MOST form) -     No Known Allergies  Chief Complaint  Patient presents with   Discharge Note    Discharge Visit    HPI:  85 y.o. male seen today for discharge from facility tomorrow.  Patient was here for short-term rehab after hospitalization for chest pain that was thought to be noncardiac work-up was negative troponin was negative.  He previously had a recent stroke on April 15 and was admitted for this as well.  He was readmitted on April 19 with altered mental status and inability of family to care for him at home.  Skilled nursing was recommended but family requested home health care instead.  He was readmitted for chest pain work-up for acute coronary syndrome as noted above was negative no changes on EKG and troponin was negative vital signs are stable.  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  Hydrochlorothiazide has been reduced because of low blood pressure but clinically he appears to be at baseline.  Confusion has been an issue during his stay here apparently there is some baseline to this.  We have started low-dose Ativan for short course for anxiety.  Previous medical history also includes type 2 diabetes  as well as COPD and atrial fibrillation he also had a coronary angiogram in 2015.  Currently he is in his room appears to be comfortable continues to be somewhat weak but is ambulatory.  He has no complaints he is a poor historian secondary to dementia  Of note a metabolic panel done earlier this week showed an elevated potassium of 5.9- he did receive Kayexalate-and update BMP will be done tomorrow  He will be going home with family who appears to be quite supportive and appears to want him to get home as soon as possible thinking he would be more at home in a familiar environment        Past Medical History:  Diagnosis Date   Atrial fibrillation (HCC)    COPD (chronic obstructive pulmonary disease) (HCC)    CVA (cerebral infarction)    Diabetes mellitus without complication (HCC)    Essential hypertension, benign    Hard of hearing     Past Surgical History:  Procedure Laterality Date   HERNIA REPAIR     LEFT HEART CATHETERIZATION WITH CORONARY ANGIOGRAM N/A 03/28/2013   Procedure: LEFT HEART CATHETERIZATION WITH CORONARY ANGIOGRAM;  Surgeon: Peter M Swaziland, MD;  Location: [facility] CATH [facility];  Service: Cardiovascular;  Laterality: N/A;      reports that he has quit smoking. His smoking use included cigarettes. He has quit using smokeless tobacco. He reports that he does not drink alcohol or use drugs. Social History   Socioeconomic History   Marital status: Married    Spouse name: Not on file   Number of children: Not on file   Years of education: Not on file   Highest education level: Not on file   Occupational History   Not on file  Social Needs   Financial resource strain: Not on file   Food insecurity:    Worry: Not on file    Inability: Not on file   Transportation needs:    Medical: Not on file    Non-medical: Not on file  Tobacco Use   Smoking status: Former Smoker    Types: Cigarettes   Smokeless tobacco: Former Neurosurgeon  Substance and Sexual Activity   Alcohol use: No   Drug use: No   Sexual activity: Not on file  Lifestyle   Physical activity:    Days per week: Not on file    Minutes per session: Not on file   Stress: Not on file  Relationships   Social connections:    Talks on phone: Not on file    Gets together: Not on file    Attends religious service: Not on file    Active member of club or organization: Not on file    Attends meetings of clubs or organizations: Not on file    Relationship status: Not on file   Intimate partner violence:    Fear of current or ex partner: Not on file    Emotionally abused: Not on file    Physically abused: Not on file    Forced sexual activity: Not on file  Other Topics Concern   Not on file  Social History Narrative   Not on file   Functional Status Survey:    No Known Allergies  Pertinent  Health Maintenance Due  Topic Date Due   FOOT EXAM  09/01/2018 (Originally 09/25/1937)   OPHTHALMOLOGY EXAM  09/01/2018 (Originally 09/25/1937)   PNA vac Low Risk Adult (1 of 2 - PCV13) 09/01/2018 (Originally 09/25/1994)   INFLUENZA VACCINE  10/26/2018   HEMOGLOBIN A1C  01/09/2019    Medications: Allergies as of 08/08/2018   No Known Allergies     Medication List       Accurate as of Aug 08, 2018 11:59 PM. If you have any questions, ask your nurse or doctor.        STOP taking these medications   albuterol 108 (90 Base) MCG/ACT inhaler Commonly known as:  VENTOLIN HFA Stopped by:  Edmon Crape, PA-C   sodium polystyrene 15 GM/60ML suspension Commonly known as:  KAYEXALATE Stopped by:  Edmon Crape, PA-C     TAKE these medications   amLODipine 5 MG tablet Commonly known as:  NORVASC Take 1 tablet (5 mg total) by mouth daily.   amoxicillin-clavulanate 875-125 MG tablet Commonly known as:  Augmentin Take 1 tablet by mouth every 12 (twelve) hours for 14 doses.   apixaban 5 MG Tabs tablet Commonly known as:  ELIQUIS Take 1 tablet (5 mg total) by mouth 2 (two) times daily.   atorvastatin 80 MG tablet Commonly known as:  LIPITOR Take 1 tablet (80 mg total) by mouth daily at 6 PM.   brimonidine 0.15 % ophthalmic solution Commonly  known as:  ALPHAGAN Place 1 drop into both eyes 2 (two) times daily.   budesonide-formoterol 80-4.5 MCG/ACT inhaler Commonly known as:  SYMBICORT Inhale 2 puffs into the lungs 2 (two) times daily. What changed:  when to take this Changed by:  Edmon Crape, PA-C   Carboxymethylcellulose Sodium 0.25 % Soln Place 2 drops into both eyes 4 (four) times daily as needed (for dry eyes).   finasteride 5 MG tablet Commonly known as:  PROSCAR Take 1 tablet (5 mg total) by mouth daily.   hydrochlorothiazide 25 MG tablet Commonly known as:  HYDRODIURIL Take 0.5 tablets (12.5 mg total) by mouth daily.   Ipratropium-Albuterol 20-100 MCG/ACT Aers respimat Commonly known as:  Combivent Respimat Inhale 1 puff into the lungs every 6 (six) hours as needed for wheezing.   levalbuterol 0.63 MG/3ML nebulizer solution Commonly known as:  XOPENEX Take 3 mLs (0.63 mg total) by nebulization every 6 (six) hours as needed for wheezing or shortness of breath.   levothyroxine 25 MCG tablet Commonly known as:  SYNTHROID Take 2 tablets (50 mcg total) by mouth daily before breakfast.   lisinopril 40 MG tablet Commonly known as:  ZESTRIL Take 0.5 tablets (20 mg total) by mouth daily. What changed:  how much to take Changed by:  Edmon Crape, PA-C   LORazepam 0.5 MG tablet Commonly known as:  Ativan Give 1/2 tab = 0.25 mg twice a day as needed for anxiety What  changed:    how much to take  how to take this  when to take this  reasons to take this  additional instructions Changed by:  Kenard Gower, NP   metFORMIN 500 MG tablet Commonly known as:  GLUCOPHAGE Take 1 tablet (500 mg total) by mouth 2 (two) times daily with a meal.   metoprolol tartrate 25 MG tablet Commonly known as:  LOPRESSOR Take 1 tablet (25 mg total) by mouth 2 (two) times daily.   Minerin Crea Apply topically. APPLY TO TRUNK AND LEGS TWICE A DAY   multivitamin with minerals tablet Take 1 tablet by mouth daily.   nitroGLYCERIN 0.4 MG SL tablet Commonly known as:  NITROSTAT Place 1 tablet (0.4 mg total) under the tongue every 5 (five) minutes as needed for chest pain. Dissolve one tablet under the tongue every 5 minutes as needed for chest pain. Repeat every 5 minutes if needed for a total of 3 tablets in 15 minutes. If no relief, call 911.   olopatadine 0.1 % ophthalmic solution Commonly known as:  PATANOL Place 1 drop into both eyes 2 (two) times daily.   omeprazole 20 MG capsule Commonly known as:  PRILOSEC Take 1 capsule (20 mg total) by mouth daily. .   polyvinyl alcohol 1.4 % ophthalmic solution Commonly known as:  LIQUIFILM TEARS Place 1 drop into both eyes 4 (four) times daily.   saccharomyces boulardii 250 MG capsule Commonly known as:  Florastor Take 1 capsule (250 mg total) by mouth 2 (two) times daily for 10 days. Started by:  Kenard Gower, NP   senna 8.6 MG Tabs tablet Commonly known as:  SENOKOT Take 2 tablets (17.2 mg total) by mouth at bedtime.   tamsulosin 0.4 MG Caps capsule Commonly known as:  FLOMAX Take 1 capsule (0.4 mg total) by mouth daily.       Review of Systems   This is limited secondary to patient being a poor historian.  But in general he is not complaining of any fever chills says he  is looking forward to going home.  Skin does not complain of rashes or itching.  Head ears eyes nose mouth and  throat does not complain of sore throat does have a history of glaucoma and has previously talked to Dr. Alwyn Ren about-fixing my eyes".  Respiratory does not complain of shortness of breath or cough he does have a history of COPD.  Cardiac does not complain of chest pain or edema.  GI is not complaining of abdominal pain have not noted nausea vomiting diarrhea constipation.  GU does not complain of dysuria he is completing treatment for UTI.  Musculoskeletal has weakness but does not complain of joint pain currently.  Neurologic positive for weakness does not complain of feeling dizzy or syncopal episodes.  And psych appears to have some baseline cognitive deficits has had some wandering episodes and confusion as noted above  Temperature is 98.4 pulse 64 respirations 18 blood pressure 118/64  Physical Exam   In general this is a pleasant elderly male who appears somewhat confused which appears to be relatively baseline.  His skin is warm and dry he does have numerous solar induced changes.  Eyes he does have arcus senilis-visual acuity appears grossly intact.  Oropharynx is clear mucous membranes appear minimally dry.  Chest is clear to auscultation there is no labored breathing there is somewhat shallow air entry.  Heart is regular rate and rhythm with some irregular beats he does not have significant lower extremity edema.  Abdomen is soft nontender with positive bowel sounds.  Musculoskeletal does move all extremities x4 with diffuse arthritic changes most prominently upper extremities and his hands.  He does have some weakness but is ambulatory.  Neurologic appears grossly intact could not appreciate lateralizing findings his speech is clear.  Psych he is oriented to self he is pleasant does follow simple verbal commands.  Could not really name the day of the week the month-  Labs reviewed: Basic Metabolic Panel: Recent Labs    02/02/18 0513 02/03/18 0540   07/24/18 0459 07/28/18 2338 07/31/18 0210 08/06/18  NA 140 136   < > 140 140 140 138  K 3.5 4.1   < > 4.1 4.0 4.1 5.9*  CL 106 108   < > 105 109 110  --   CO2 25 22   < > 26 24 22   --   GLUCOSE 225* 159*   < > 125* 170* 168*  --   BUN 20 16   < > 21 23 21  27*  CREATININE 1.27* 0.94   < > 1.43* 1.21 1.23 1.2  CALCIUM 8.9 8.4*   < > 9.4 9.1 9.3  --   MG 2.0 1.6*  --   --   --   --   --   PHOS  --  2.7  --   --   --   --   --    < > = values in this interval not displayed.   Liver Function Tests: Recent Labs    07/14/18 0301 07/24/18 0459 07/31/18 0210  AST 29 19 15   ALT 13 17 15   ALKPHOS 41 51 43  BILITOT 0.6 0.7 0.6  PROT 6.1* 6.2* 5.6*  ALBUMIN 3.6 3.8 3.1*   Recent Labs    04/02/18 0804  LIPASE 39   Recent Labs    07/10/18 1344  AMMONIA 16   CBC: Recent Labs    07/24/18 0459 07/28/18 2338 07/31/18 0210 08/06/18  WBC 5.7 7.2 6.4 6.4  NEUTROABS 3.4 4.7 4.1 4  HGB 13.0 11.4* 11.8* 13.8  HCT 41.1 36.8* 37.0* 41  MCV 90.5 92.0 90.2  --   PLT 216 204 209 213   Cardiac Enzymes: Recent Labs    07/31/18 0528 07/31/18 0744 07/31/18 1109  TROPONINI <0.03 <0.03 <0.03   BNP: Invalid input(s): POCBNP CBG: Recent Labs    07/31/18 0546 07/31/18 1137 07/31/18 1625  GLUCAP 215* 172* 112*    Procedures and Imaging Studies During Stay: Ct Angio Head W Or Wo Contrast  Result Date: 07/10/2018 CLINICAL DATA:  Slurred speech EXAM: CT ANGIOGRAPHY HEAD AND NECK TECHNIQUE: Multidetector CT imaging of the head and neck was performed using the standard protocol during bolus administration of intravenous contrast. Multiplanar CT image reconstructions and MIPs were obtained to evaluate the vascular anatomy. Carotid stenosis measurements (when applicable) are obtained utilizing NASCET criteria, using the distal internal carotid diameter as the denominator. CONTRAST:  75mL OMNIPAQUE IOHEXOL 350 MG/ML SOLN COMPARISON:  Head CT same day FINDINGS: CTA NECK FINDINGS Aortic arch:  Atherosclerotic irregularity of the aortic arch. Branching pattern is normal with left vertebral artery arising from the arch. No origin stenosis. Right carotid system: Common carotid artery widely patent to the bifurcation region. Calcified plaque at the carotid bifurcation and ICA bulb. No stenosis. Cervical ICA widely patent beyond that. Left carotid system: Common carotid artery widely patent to the bifurcation. Soft and calcified plaque at the carotid bifurcation and ICA bulb. Minimal diameter at the distal bulb is 4.5 mm. Compared to a more distal cervical ICA diameter of 5 mm, this indicates a 10% stenosis. Vertebral arteries: Both vertebral arteries are widely patent. As noted above, the left vertebral artery arises from the arch. Skeleton: Ordinary cervical spondylosis. Other neck: No mass or lymphadenopathy. Upper chest: Emphysema and pulmonary scarring.  No active process. Review of the MIP images confirms the above findings CTA HEAD FINDINGS Anterior circulation: Right internal carotid artery is patent through the siphon region. There is atherosclerotic calcification but no narrowing greater than 30%. Right ICA supplies primarily the right middle cerebral artery territory. There is a diminutive A1 segment. No large or medium vessel occlusion identified. On the left, the ICA is patent through the siphon region with calcification but no stenosis greater than 30%. Dominant A1 segment is widely patent. Left M1 segment is patent and normal. Superior division of the left middle cerebral artery is presumably chronically occluded, serving the region of old infarction. No sign of acute embolic occlusion. Posterior circulation: Both vertebral arteries are patent through the foramen magnum to the basilar. No basilar stenosis. Posterior circulation branch vessels are patent. Venous sinuses: Patent and normal. Anatomic variants: None significant otherwise. Delayed phase: No abnormal enhancement. Review of the MIP  images confirms the above findings IMPRESSION: Atherosclerotic change of the aorta with marked irregularity. Atherosclerotic change at both carotid bifurcations. No stenosis on the right. 10% stenosis of the distal bulb on the left. No acute intracranial large or medium vessel occlusion. 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 Electronically Signed   By: Paulina Fusi M.D.   On: 07/10/2018 12:32   Dg Chest 2 View  Result Date: 07/10/2018 CLINICAL DATA:  Altered mental status.  Stroke. EXAM: CHEST - 2 VIEW COMPARISON:  06/05/2018 FINDINGS: Artifact overlies the chest. Heart size is at the upper limits of normal. There is aortic atherosclerosis. The lungs are clear except for mild scarring.  No infiltrate, collapse or effusion. IMPRESSION: No active cardiopulmonary disease. Electronically Signed   By: Paulina Fusi M.D.   On: 07/10/2018 13:27   Ct Head Wo Contrast  Result Date: 07/14/2018 CLINICAL DATA:  85 y/o  M; altered mental status. History of CVA. EXAM: CT HEAD WITHOUT CONTRAST TECHNIQUE: Contiguous axial images were obtained from the base of the skull through the vertex without intravenous contrast. COMPARISON:  07/10/2018 CT of head and MRI of head. FINDINGS: Brain: Small lucency within the left hemi pons corresponding to infarction on prior MRI of the head is stable in distribution. Chronic infarcts are present within the left superior cerebellar hemisphere in the left MCA distribution. No new finding of stroke, hemorrhage, extra-axial collection, hydrocephalus, or herniation. Background of mild chronic microvascular ischemic changes and moderate volume loss of the brain. Vascular: Calcific atherosclerosis of the carotid siphons. No hyperdense vessel identified. Skull: Normal. Negative for fracture or focal lesion. Sinuses/Orbits: Mild frontal and ethmoid sinus mucosal thickening. Normal aeration of mastoid air  cells. Bilateral intra-ocular lens replacement. Other: None. IMPRESSION: 1. No new acute intracranial abnormality identified. 2. Small lucency within left hemi pons corresponding to recent infarction on prior MRI is stable in distribution. 3. Stable chronic infarcts within left superior cerebellar hemisphere and left MCA distribution. 4. Stable chronic microvascular ischemic changes and volume loss of the brain. Electronically Signed   By: Mitzi Hansen M.D.   On: 07/14/2018 03:33   Ct Angio Neck W Or Wo Contrast  Result Date: 07/10/2018 CLINICAL DATA:  Slurred speech EXAM: CT ANGIOGRAPHY HEAD AND NECK TECHNIQUE: Multidetector CT imaging of the head and neck was performed using the standard protocol during bolus administration of intravenous contrast. Multiplanar CT image reconstructions and MIPs were obtained to evaluate the vascular anatomy. Carotid stenosis measurements (when applicable) are obtained utilizing NASCET criteria, using the distal internal carotid diameter as the denominator. CONTRAST:  75mL OMNIPAQUE IOHEXOL 350 MG/ML SOLN COMPARISON:  Head CT same day FINDINGS: CTA NECK FINDINGS Aortic arch: Atherosclerotic irregularity of the aortic arch. Branching pattern is normal with left vertebral artery arising from the arch. No origin stenosis. Right carotid system: Common carotid artery widely patent to the bifurcation region. Calcified plaque at the carotid bifurcation and ICA bulb. No stenosis. Cervical ICA widely patent beyond that. Left carotid system: Common carotid artery widely patent to the bifurcation. Soft and calcified plaque at the carotid bifurcation and ICA bulb. Minimal diameter at the distal bulb is 4.5 mm. Compared to a more distal cervical ICA diameter of 5 mm, this indicates a 10% stenosis. Vertebral arteries: Both vertebral arteries are widely patent. As noted above, the left vertebral artery arises from the arch. Skeleton: Ordinary cervical spondylosis. Other neck: No  mass or lymphadenopathy. Upper chest: Emphysema and pulmonary scarring.  No active process. Review of the MIP images confirms the above findings CTA HEAD FINDINGS Anterior circulation: Right internal carotid artery is patent through the siphon region. There is atherosclerotic calcification but no narrowing greater than 30%. Right ICA supplies primarily the right middle cerebral artery territory. There is a diminutive A1 segment. No large or medium vessel occlusion identified. On the left, the ICA is patent through the siphon region with calcification but no stenosis greater than 30%. Dominant A1 segment is widely patent. Left M1 segment is patent and normal. Superior division of the left middle cerebral artery is presumably chronically occluded, serving the region of old infarction. No sign of acute embolic occlusion. Posterior circulation: Both vertebral arteries are  patent through the foramen magnum to the basilar. No basilar stenosis. Posterior circulation branch vessels are patent. Venous sinuses: Patent and normal. Anatomic variants: None significant otherwise. Delayed phase: No abnormal enhancement. Review of the MIP images confirms the above findings IMPRESSION: Atherosclerotic change of the aorta with marked irregularity. Atherosclerotic change at both carotid bifurcations. No stenosis on the right. 10% stenosis of the distal bulb on the left. No acute intracranial large or medium vessel occlusion. 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 Electronically Signed   By: Paulina Fusi M.D.   On: 07/10/2018 12:32   Mr Brain Wo Contrast  Result Date: 07/10/2018 CLINICAL DATA:  Stroke follow-up EXAM: MRI HEAD WITHOUT CONTRAST TECHNIQUE: Multiplanar, multiecho pulse sequences of the brain and surrounding structures were obtained without intravenous contrast. COMPARISON:  CTA head neck 07/10/2018 FINDINGS: BRAIN: Small  focus of abnormal diffusion restriction within the left pons. No other diffusion abnormality. The midline structures are normal. Old posterior left MCA territory infarct. Multifocal white matter hyperintensity, most commonly due to chronic ischemic microangiopathy. Generalized atrophy without lobar predilection. Susceptibility-sensitive sequences show no chronic microhemorrhage or superficial siderosis. No mass lesion. VASCULAR: The major intracranial arterial and venous sinus flow voids are normal. SKULL AND UPPER CERVICAL SPINE: Calvarial bone marrow signal is normal. There is no skull base mass. Visualized upper cervical spine and soft tissues are normal. SINUSES/ORBITS: No fluid levels or advanced mucosal thickening. No mastoid or middle ear effusion. The orbits are normal. IMPRESSION: 1. Small acute or early subacute infarct the left pons, in keeping with reported right-sided weakness. No acute hemorrhage. 2. Old posterior left MCA territory infarct encephalomalacia. 3. Generalized atrophy. Electronically Signed   By: Deatra Robinson M.D.   On: 07/10/2018 16:04   Dg Chest Port 1 View  Result Date: 07/31/2018 CLINICAL DATA:  Chest pain EXAM: PORTABLE CHEST 1 VIEW COMPARISON:  07/19/2018 FINDINGS: Heart is upper limits normal is size. Bibasilar linear densities, likely scarring. No acute confluent opacities or effusions. No acute bony abnormality. IMPRESSION: Chronic changes.  No active disease. Electronically Signed   By: Charlett Nose M.D.   On: 07/31/2018 01:55   Dg Chest Port 1 View  Result Date: 07/19/2018 CLINICAL DATA:  Chest pain EXAM: PORTABLE CHEST 1 VIEW COMPARISON:  1920 FINDINGS: The lungs are hyperinflated with diffuse interstitial prominence. No focal airspace consolidation or pulmonary edema. No pleural effusion or pneumothorax. Normal cardiomediastinal contours. There is bibasilar atelectasis. IMPRESSION: COPD without acute airspace disease.  Bibasilar atelectasis. Electronically Signed   By:  Deatra Robinson M.D.   On: 07/19/2018 00:32   Dg Chest Portable 1 View  Result Date: 07/14/2018 CLINICAL DATA:  85 y/o  M; episodes of altered mental status. EXAM: PORTABLE CHEST 1 VIEW COMPARISON:  07/10/2018 chest radiograph. FINDINGS: Normal cardiac silhouette. Aortic atherosclerosis with calcification. Stable chronic interstitial changes, hyperinflated lungs, emphysema. No focal consolidation. No pleural effusion or pneumothorax. No acute osseous abnormality is evident. IMPRESSION: Stable findings of COPD.  No acute pulmonary process identified. Electronically Signed   By: Mitzi Hansen M.D.   On: 07/14/2018 03:30   Dg Swallowing Func-speech Pathology  Result Date: 07/11/2018 Objective Swallowing Evaluation: Type of Study: MBS-Modified Barium Swallow Study  Patient Details Name: SABIEN UMLAND MRN: [id number] Date of Birth: 1927/06/24 Today's Date: 07/11/2018 Time: SLP Start Time (ACUTE ONLY): 1250 -SLP Stop Time (ACUTE ONLY): 1306 SLP Time Calculation (min) (ACUTE ONLY): 16 min  Past Medical History: Past Medical History: Diagnosis Date  Atrial fibrillation (HCC)   COPD (chronic obstructive pulmonary disease) (HCC)   CVA (cerebral infarction)   Diabetes mellitus without complication (HCC)   Essential hypertension, benign   Hard of hearing  Past Surgical History: Past Surgical History: Procedure Laterality Date  HERNIA REPAIR    LEFT HEART CATHETERIZATION WITH CORONARY ANGIOGRAM N/A 03/28/2013  Procedure: LEFT HEART CATHETERIZATION WITH CORONARY ANGIOGRAM;  Surgeon: Peter M SwazilandJordan, MD;  [facility];  Service: Cardiovascular;  Laterality: N/A; HPI: Pt is an 85 y.o. male who presented with R sided weakness and speech difficulties. PMH is significant for Afib on eliquis, COPD, DM, HTN, CAD, h/o CVA. 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  Subjective: alert, communicative Assessment  / Plan / Recommendation [facility] IP CLINICAL IMPRESSIONS 07/11/2018 Clinical Impression Pt presents with functional oropharyngeal swallow marked by prolonged, piece-meal bolusing of regular solids (ultimately effective); adequate propulsion of all materials through pharynx with no residue post-swallow.  There was adequate laryngeal vestibule closure with only occasional penetration of thin liquids (PAS #2).  Penetration did not reach the vocal folds; there was no aspiration.  13 mm barium pill passed readily without deficit.  Recommend resuming a regular diet with thin liquids; pills whole with liquid.  No SLP f/u is needed.  SLP Visit Diagnosis Dysphagia, unspecified (R13.10) Attention and concentration deficit following -- Frontal lobe and executive function deficit following -- Impact on safety and function No limitations   [facility] IP TREATMENT RECOMMENDATION 07/11/2018 Treatment Recommendations No treatment recommended at this time   No flowsheet data found. [facility] IP DIET RECOMMENDATION 07/11/2018 SLP Diet Recommendations Regular solids;Thin liquid Liquid Administration via Cup;Straw Medication Administration Whole meds with liquid Compensations -- Postural Changes --   [facility] IP OTHER RECOMMENDATIONS 07/11/2018 Recommended Consults -- Oral Care Recommendations Oral care BID Other Recommendations Order thickener from pharmacy   [facility] IP FOLLOW UP RECOMMENDATIONS 07/11/2018 Follow up Recommendations None   No flowsheet data found.     [facility] IP ORAL PHASE 07/11/2018 Oral Phase Impaired Oral - Pudding Teaspoon -- Oral - Pudding Cup -- Oral - Honey Teaspoon -- Oral - Honey Cup -- Oral - Nectar Teaspoon -- Oral - Nectar Cup -- Oral - Nectar Straw -- Oral - Thin Teaspoon -- Oral - Thin Cup -- Oral - Thin Straw -- Oral - Puree -- Oral - Mech Soft -- Oral - Regular Piecemeal swallowing;Delayed oral transit Oral - Multi-Consistency -- Oral - Pill -- Oral Phase - Comment --  [facility] IP PHARYNGEAL PHASE 07/11/2018 Pharyngeal Phase WFL Pharyngeal-  Pudding Teaspoon -- Pharyngeal -- Pharyngeal- Pudding Cup -- Pharyngeal -- Pharyngeal- Honey Teaspoon -- Pharyngeal -- Pharyngeal- Honey Cup -- Pharyngeal -- Pharyngeal- Nectar Teaspoon -- Pharyngeal -- Pharyngeal- Nectar Cup -- Pharyngeal -- Pharyngeal- Nectar Straw -- Pharyngeal -- Pharyngeal- Thin Teaspoon -- Pharyngeal -- Pharyngeal- Thin Cup -- Pharyngeal -- Pharyngeal- Thin Straw -- Pharyngeal -- Pharyngeal- Puree -- Pharyngeal -- Pharyngeal- Mechanical Soft -- Pharyngeal -- Pharyngeal- Regular -- Pharyngeal -- Pharyngeal- Multi-consistency -- Pharyngeal -- Pharyngeal- Pill -- Pharyngeal -- Pharyngeal Comment --  [facility] IP CERVICAL ESOPHAGEAL PHASE 07/11/2018 Cervical Esophageal Phase WFL Pudding Teaspoon -- Pudding Cup -- Honey Teaspoon -- Honey Cup -- Nectar Teaspoon -- Nectar Cup -- Nectar Straw -- Thin Teaspoon -- Thin Cup -- Thin Straw -- Puree -- Mechanical Soft -- Regular -- Multi-consistency -- Pill --  Cervical Esophageal Comment -- Blenda Mounts Laurice 07/11/2018, 1:15 PM              Ct Head Code Stroke Wo Contrast  Result Date: 07/10/2018 CLINICAL DATA:  Code stroke.  Slurred speech EXAM: CT HEAD WITHOUT CONTRAST TECHNIQUE: Contiguous axial images were obtained from the base of the skull through the vertex without intravenous contrast. COMPARISON:  06/13/2017 FINDINGS: Brain: No brainstem abnormality is seen. Old left cerebellar infarction. Old infarction in the left MCA territory with atrophy, encephalomalacia and gliosis of the deep insula, posterior temporal lobe and parietal lobe. No sign of acute infarction, mass lesion, hemorrhage, hydrocephalus or extra-axial collection Vascular: There is atherosclerotic calcification of the major vessels at the base of the brain. Skull: Negative Sinuses/Orbits: Clear/normal Other: Left parotid mass as seen previously. ASPECTS [facility] Stroke Program Early CT Score) - Ganglionic level infarction (caudate, lentiform nuclei, internal capsule, insula, M1-M3  cortex): No acute insult identifiable. Extensive old infarct on the left. - Supraganglionic infarction (M4-M6 cortex): A old infarct on the left. Total score (0-10 with 10 being normal): No acute lesion suspected. Difficult to apply given the extensive old left MCA infarction. IMPRESSION: 1. No acute finding. No apparent change since March of last year. Old infarction in the left MCA territory. 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 2. ASPECTS is difficult to apply given the extensive old left MCA stroke. 3. These results were communicated to Dr. Pearlean Brownie at 12:20 pmon 4/15/2020by text page via the [facility] messaging system. Electronically Signed   By: Paulina Fusi M.D.   On: 07/10/2018 12:20    Assessment/Plan:    #1 history of VRE-UTI he is completing a course of Augmentin will be on this through May 21.  At this point appears to be doing relatively well.  2.  History of chest pain as noted above work-up in the hospital was negative for acute coronary syndrome he does have nitroglycerin as needed this is not really been an issue during his stay here.  3.  History of type 2 diabetes he continues on Glucophage 500 mg twice daily blood sugars have largely been in the 100s area was 235 this morning but this appears to be somewhat of an anomaly.  This will need to be monitored at home he will have nursing support.  4.  Hypertension blood pressure appears to be stable it was 118/64 today he is on Norvasc 5 mg a day-Lopressor 25 mg twice daily-lisinopril 40 mg a day his hydrochlorothiazide has been reduced down to 12.5 mg a day because of hypotension concerns.  5.  History of COPD he was seen by Dr. Alwyn Ren yesterday and is now on Levalbuterol he also gets Combivent and budesonide this appears to be stable.  6.  History of atrial fibrillation this appears rate controlled on Lopressor he is on Eliquis for anticoagulation.  7.  History of hyperkalemia potassium was 5.9 on lab done earlier this week he did receive  Kayexalate x2 doses- this was discussed with Dr. Alwyn Ren and will decrease his lisinopril down to 20 mg a day- I also spoke with nurse practitioner who will be here tomorrow and she will check on an updated metabolic panel-   8.  History of anxiety again we have started him on low-dose Ativan twice a day as needed we will continue this for another week this was discussed with Dr. Alwyn Ren.  9.  History of BPH he continues on Flomax and Proscar he is not really had overt symptoms to my  knowledge during his stay here.  10.  History of hypothyroidism he continues on Synthroid TSH was normal at 2.57 in the hospital.  11.  History of glaucoma he continues on topical eyedrops including olopatadine  and brimonidine  #12 history of neurocognitive issues- as noted previously by Dr. Alwyn Ren will await primary care provider follow-up to establish baseline cognitive status once he completes treatment for UTI  Again he will be going home with family who is very supportive he will need home health as well as continued PT and OT social worker support and nursing to help with medical management he also will need expedient primary care follow-up again this has been complicated somewhat with coronavirus issues.  Also will await updated lab tomorrow and will be seen by our nurse practitioner.  [id number] note greater than 30 minutes spent on this discharge summary- greater than 50% of time spent coordinating a plan of care for numerous conditions

## 2018-08-09 ENCOUNTER — Encounter: Payer: Self-pay | Admitting: Adult Health

## 2018-08-09 ENCOUNTER — Encounter: Payer: Self-pay | Admitting: Internal Medicine

## 2018-08-09 ENCOUNTER — Non-Acute Institutional Stay (SKILLED_NURSING_FACILITY): Payer: Medicare Other | Admitting: Adult Health

## 2018-08-09 DIAGNOSIS — I4891 Unspecified atrial fibrillation: Secondary | ICD-10-CM | POA: Diagnosis not present

## 2018-08-09 DIAGNOSIS — B952 Enterococcus as the cause of diseases classified elsewhere: Secondary | ICD-10-CM

## 2018-08-09 DIAGNOSIS — I10 Essential (primary) hypertension: Secondary | ICD-10-CM

## 2018-08-09 DIAGNOSIS — E039 Hypothyroidism, unspecified: Secondary | ICD-10-CM

## 2018-08-09 DIAGNOSIS — E875 Hyperkalemia: Secondary | ICD-10-CM | POA: Diagnosis not present

## 2018-08-09 DIAGNOSIS — N400 Enlarged prostate without lower urinary tract symptoms: Secondary | ICD-10-CM

## 2018-08-09 DIAGNOSIS — N390 Urinary tract infection, site not specified: Secondary | ICD-10-CM

## 2018-08-09 DIAGNOSIS — E1165 Type 2 diabetes mellitus with hyperglycemia: Secondary | ICD-10-CM

## 2018-08-09 NOTE — Progress Notes (Signed)
Location:  [facility] Room Number: 304/A Place of Service:  SNF (31) Provider:  Kenard Gower, DNP, FNP-BC  Patient Care Team: Bing Neighbors, FNP as PCP - General (Family Medicine) Jens Som Madolyn Frieze, MD as PCP - Cardiology (Cardiology)  Extended Emergency Contact Information Primary Emergency Contact: willia, lampert Mobile Phone: [phone] Relation: Son Secondary Emergency Contact: Marshall,Taylor Mobile Phone: [phone] Relation: Daughter  Code Status:  DNR  Goals of care: Advanced Directive information Advanced Directives 08/09/2018  Does Patient Have a Medical Advance Directive? Yes  Type of Advance Directive Out of facility DNR (pink MOST or yellow form)  Does patient want to make changes to medical advance directive? No - Patient declined  Copy of Healthcare Power of Attorney in Chart? No - copy requested  Would patient like information on creating a medical advance directive? No - Patient declined  Pre-existing out of facility DNR order (yellow form or pink MOST form) -     Chief Complaint  Patient presents with   Acute Visit    FlU Potassium    HPI:  Pt is a 85 y.o. Martinez seen today for an acute visit. He had an elevated K 5.9 and was given Kayexalate X 2. A repeat BMP done this morning showed K 4.4 . He is for discharge today and daughter-in-law was here to pick him up.  He was admitted to [facility] on 08/02/18 from a recent hospitalization due to VRE UTI. He was started on Augmentin. He will continue to have  7 more days of Augmentin upon discharge. He will have Home Health PT, OT, SW and Nurse for medication management.   Past Medical History:  Diagnosis Date   Atrial fibrillation (HCC)    COPD (chronic obstructive pulmonary disease) (HCC)    CVA (cerebral infarction)    Diabetes mellitus without complication (HCC)    Essential hypertension, benign    Hard of hearing    Past Surgical  History:  Procedure Laterality Date   HERNIA REPAIR     LEFT HEART CATHETERIZATION WITH CORONARY ANGIOGRAM N/A 03/28/2013   Procedure: LEFT HEART CATHETERIZATION WITH CORONARY ANGIOGRAM;  Surgeon: Peter M Swaziland, MD;  Location: [facility] CATH LAB;  Service: Cardiovascular;  Laterality: N/A;    No Known Allergies  Outpatient Encounter Medications as of 08/09/2018  Medication Sig   amLODipine (NORVASC) 5 MG tablet Take 1 tablet (5 mg total) by mouth daily.   amoxicillin-clavulanate (AUGMENTIN) 875-125 MG tablet Take 1 tablet by mouth every 12 (twelve) hours for 14 doses.   apixaban (ELIQUIS) 5 MG TABS tablet Take 1 tablet (5 mg total) by mouth 2 (two) times daily.   atorvastatin (LIPITOR) 80 MG tablet Take 1 tablet (80 mg total) by mouth daily at 6 PM.   brimonidine (ALPHAGAN) 0.15 % ophthalmic solution Place 1 drop into both eyes 2 (two) times daily.   budesonide-formoterol (SYMBICORT) 80-4.5 MCG/ACT inhaler Inhale 2 puffs into the lungs 2 (two) times daily.   Carboxymethylcellulose Sodium 0.25 % SOLN Place 2 drops into both eyes 4 (four) times daily as needed (for dry eyes).   finasteride (PROSCAR) 5 MG tablet Take 1 tablet (5 mg total) by mouth daily.   hydrochlorothiazide (HYDRODIURIL) 25 MG tablet Take 0.5 tablets (12.5 mg total) by mouth daily.   Ipratropium-Albuterol (COMBIVENT RESPIMAT) 20-100 MCG/ACT AERS respimat Inhale 1 puff into the lungs every 6 (six) hours as needed for wheezing.   levalbuterol (XOPENEX) 0.63 MG/3ML nebulizer solution Take 3 mLs (0.63  mg total) by nebulization every 6 (six) hours as needed for wheezing or shortness of breath.   levothyroxine (SYNTHROID) 25 MCG tablet Take 2 tablets (50 mcg total) by mouth daily before breakfast.   lisinopril (ZESTRIL) 40 MG tablet Take 0.5 tablets (20 mg total) by mouth daily.   LORazepam (ATIVAN) 0.5 MG tablet Give 1/2 tab = 0.25 mg twice a day as needed for anxiety   metFORMIN (GLUCOPHAGE) 500 MG tablet Take 1 tablet  (500 mg total) by mouth 2 (two) times daily with a meal.   metoprolol tartrate (LOPRESSOR) 25 MG tablet Take 1 tablet (25 mg total) by mouth 2 (two) times daily.   Multiple Vitamins-Minerals (MULTIVITAMIN WITH MINERALS) tablet Take 1 tablet by mouth daily.   nitroGLYCERIN (NITROSTAT) 0.4 MG SL tablet Place 1 tablet (0.4 mg total) under the tongue every 5 (five) minutes as needed for chest pain. Dissolve one tablet under the tongue every 5 minutes as needed for chest pain. Repeat every 5 minutes if needed for a total of 3 tablets in 15 minutes. If no relief, call 911.   olopatadine (PATANOL) 0.1 % ophthalmic solution Place 1 drop into both eyes 2 (two) times daily.   omeprazole (PRILOSEC) 20 MG capsule Take 1 capsule (20 mg total) by mouth daily. .   polyvinyl alcohol (LIQUIFILM TEARS) 1.4 % ophthalmic solution Place 1 drop into both eyes 4 (four) times daily.   saccharomyces boulardii (FLORASTOR) 250 MG capsule Take 1 capsule (250 mg total) by mouth 2 (two) times daily for 10 days.   senna (SENOKOT) 8.6 MG TABS tablet Take 2 tablets (17.2 mg total) by mouth at bedtime.   Skin Protectants, Misc. (MINERIN) CREA Apply topically. APPLY TO TRUNK AND LEGS TWICE A DAY   tamsulosin (FLOMAX) 0.4 MG CAPS capsule Take 1 capsule (0.4 mg total) by mouth daily.   No facility-administered encounter medications on file as of 08/09/2018.     Review of Systems  GENERAL: No change in appetite, no fatigue, no weight changes, no fever, chills or weakness MOUTH and THROAT: Denies oral discomfort, gingival pain or bleeding RESPIRATORY: no cough, SOB, DOE, wheezing, hemoptysis CARDIAC: No chest pain, edema or palpitations GI: No abdominal pain, diarrhea, constipation, heart burn, nausea or vomiting GU: Denies dysuria, frequency, hematuria, incontinence, or discharge NEUROLOGICAL: Denies dizziness, syncope, numbness, or headache PSYCHIATRIC: Denies feelings of depression or anxiety. No report of  hallucinations, insomnia, paranoia, or agitation   Immunization History  Administered Date(s) Administered   Influenza-Unspecified 12/25/2017   Pertinent  Health Maintenance Due  Topic Date Due   FOOT EXAM  09/01/2018 (Originally 09/25/1937)   OPHTHALMOLOGY EXAM  09/01/2018 (Originally 09/25/1937)   PNA vac Low Risk Adult (1 of 2 - PCV13) 09/01/2018 (Originally 09/25/1994)   INFLUENZA VACCINE  10/26/2018   HEMOGLOBIN A1C  01/09/2019   Fall Risk  07/22/2018  Falls in the past year? 0     Vitals:   08/09/18 1456  BP: 124/76  Pulse: 72  Resp: 20  Temp: 97.7 F (36.5 C)  Weight: 160 lb (72.6 kg)  Height: 5\' 8"  (1.727 m)   Body mass index is 24.33 kg/m.  Physical Exam  GENERAL APPEARANCE: Well nourished. In no acute distress. Normal body habitus SKIN:  Skin is warm and dry.  MOUTH and THROAT: Lips are without lesions. Oral mucosa is moist and without lesions.  RESPIRATORY: Breathing is even & unlabored, BS CTAB CARDIAC: RRR, no murmur,no extra heart sounds, no edema GI: Abdomen soft, normal BS,  no masses, no tenderness EXTREMITIES:  Able to move X 4 extremities NEUROLOGICAL: There is no tremor. Speech is clear. Alert to self, disoriented to time and place. PSYCHIATRIC:  Affect and behavior are appropriate   Labs reviewed: Recent Labs    02/02/18 0513 02/03/18 0540  07/24/18 0459 07/28/18 2338 07/31/18 0210 08/06/18  NA 140 136   < > 140 140 140 138  K 3.5 4.1   < > 4.1 4.0 4.1 5.9*  CL 106 108   < > 105 109 110  --   CO2 25 22   < > --   GLUCOSE 225* 159*   < > 125* 170* 168*  --   BUN 20 16   < > 27*  CREATININE 1.27* 0.94   < > 1.43* 1.21 1.23 1.2  CALCIUM 8.9 8.4*   < > 9.4 9.1 9.3  --   MG 2.0 1.6*  --   --   --   --   --   PHOS  --  2.7  --   --   --   --   --    < > = values in this interval not displayed.   Recent Labs    07/14/18 0301 07/24/18 0459 07/31/18 0210  AST ALT ALKPHOS 41 51 43  BILITOT 0.6  0.7 0.6  PROT 6.1* 6.2* 5.6*  ALBUMIN 3.6 3.8 3.1*   Recent Labs    07/24/18 0459 07/28/18 2338 07/31/18 0210 08/06/18  WBC 5.7 7.2 6.4 6.4  NEUTROABS 3.4 4.7 4.1 4  HGB 13.0 11.4* 11.8* 13.8  HCT 41.1 36.8* 37.0* 41  MCV 90.5 92.0 90.2  --   PLT 216 204 209 213   Lab Results  Component Value Date   TSH 2.574 07/11/2018   Lab Results  Component Value Date   HGBA1C 7.9 (H) 07/10/2018   Lab Results  Component Value Date   CHOL 226 (H) 07/10/2018   HDL 43 07/10/2018   LDLCALC 150 (H) 07/10/2018   TRIG 165 (H) 07/10/2018   CHOLHDL 5.3 07/10/2018    Significant Diagnostic Results in last 30 days:  Ct Head Wo Contrast  Result Date: 07/14/2018 CLINICAL DATA:  85 y/o  M; altered mental status. History of CVA. EXAM: CT HEAD WITHOUT CONTRAST TECHNIQUE: Contiguous axial images were obtained from the base of the skull through the vertex without intravenous contrast. COMPARISON:  07/10/2018 CT of head and MRI of head. FINDINGS: Brain: Small lucency within the left hemi pons corresponding to infarction on prior MRI of the head is stable in distribution. Chronic infarcts are present within the left superior cerebellar hemisphere in the left MCA distribution. No new finding of stroke, hemorrhage, extra-axial collection, hydrocephalus, or herniation. Background of mild chronic microvascular ischemic changes and moderate volume loss of the brain. Vascular: Calcific atherosclerosis of the carotid siphons. No hyperdense vessel identified. Skull: Normal. Negative for fracture or focal lesion. Sinuses/Orbits: Mild frontal and ethmoid sinus mucosal thickening. Normal aeration of mastoid air cells. Bilateral intra-ocular lens replacement. Other: None. IMPRESSION: 1. No new acute intracranial abnormality identified. 2. Small lucency within left hemi pons corresponding to recent infarction on prior MRI is stable in distribution. 3. Stable chronic infarcts within left superior cerebellar hemisphere and left  MCA distribution. 4. Stable chronic microvascular ischemic changes and volume loss of the brain. Electronically Signed   By: Mitzi Hansen M.D.   On:  07/14/2018 03:33   Mr Brain Wo Contrast  Result Date: 07/10/2018 CLINICAL DATA:  Stroke follow-up EXAM: MRI HEAD WITHOUT CONTRAST TECHNIQUE: Multiplanar, multiecho pulse sequences of the brain and surrounding structures were obtained without intravenous contrast. COMPARISON:  CTA head neck 07/10/2018 FINDINGS: BRAIN: Small focus of abnormal diffusion restriction within the left pons. No other diffusion abnormality. The midline structures are normal. Old posterior left MCA territory infarct. Multifocal white matter hyperintensity, most commonly due to chronic ischemic microangiopathy. Generalized atrophy without lobar predilection. Susceptibility-sensitive sequences show no chronic microhemorrhage or superficial siderosis. No mass lesion. VASCULAR: The major intracranial arterial and venous sinus flow voids are normal. SKULL AND UPPER CERVICAL SPINE: Calvarial bone marrow signal is normal. There is no skull base mass. Visualized upper cervical spine and soft tissues are normal. SINUSES/ORBITS: No fluid levels or advanced mucosal thickening. No mastoid or middle ear effusion. The orbits are normal. IMPRESSION: 1. Small acute or early subacute infarct the left pons, in keeping with reported right-sided weakness. No acute hemorrhage. 2. Old posterior left MCA territory infarct encephalomalacia. 3. Generalized atrophy. Electronically Signed   By: Deatra Robinson M.D.   On: 07/10/2018 16:04   Dg Chest Port 1 View  Result Date: 07/31/2018 CLINICAL DATA:  Chest pain EXAM: PORTABLE CHEST 1 VIEW COMPARISON:  07/19/2018 FINDINGS: Heart is upper limits normal is size. Bibasilar linear densities, likely scarring. No acute confluent opacities or effusions. No acute bony abnormality. IMPRESSION: Chronic changes.  No active disease. Electronically Signed   By: Charlett Nose M.D.   On: 07/31/2018 01:55   Dg Chest Port 1 View  Result Date: 07/19/2018 CLINICAL DATA:  Chest pain EXAM: PORTABLE CHEST 1 VIEW COMPARISON:  1920 FINDINGS: The lungs are hyperinflated with diffuse interstitial prominence. No focal airspace consolidation or pulmonary edema. No pleural effusion or pneumothorax. Normal cardiomediastinal contours. There is bibasilar atelectasis. IMPRESSION: COPD without acute airspace disease.  Bibasilar atelectasis. Electronically Signed   By: Deatra Robinson M.D.   On: 07/19/2018 00:32   Dg Chest Portable 1 View  Result Date: 07/14/2018 CLINICAL DATA:  85 y/o  M; episodes of altered mental status. EXAM: PORTABLE CHEST 1 VIEW COMPARISON:  07/10/2018 chest radiograph. FINDINGS: Normal cardiac silhouette. Aortic atherosclerosis with calcification. Stable chronic interstitial changes, hyperinflated lungs, emphysema. No focal consolidation. No pleural effusion or pneumothorax. No acute osseous abnormality is evident. IMPRESSION: Stable findings of COPD.  No acute pulmonary process identified. Electronically Signed   By: Mitzi Hansen M.D.   On: 07/14/2018 03:30   Dg Swallowing Func-speech Pathology  Result Date: 07/11/2018 Objective Swallowing Evaluation: Type of Study: MBS-Modified Barium Swallow Study  Patient Details Name: TACOMA MERIDA MRN: [id number] Date of Birth: [birth year] Today's Date: 07/11/2018 Time: SLP Start Time (ACUTE ONLY): 1250 -SLP Stop Time (ACUTE ONLY): 1306 SLP Time Calculation (min) (ACUTE ONLY): 16 min Past Medical History: Past Medical History: Diagnosis Date  Atrial fibrillation (HCC)   COPD (chronic obstructive pulmonary disease) (HCC)   CVA (cerebral infarction)   Diabetes mellitus without complication (HCC)   Essential hypertension, benign   Hard of hearing  Past Surgical History: Past Surgical History: Procedure Laterality Date  HERNIA REPAIR    LEFT HEART CATHETERIZATION WITH CORONARY ANGIOGRAM N/A 03/28/2013  Procedure: LEFT  HEART CATHETERIZATION WITH CORONARY ANGIOGRAM;  Surgeon: Peter M Swaziland, MD;  Location: [facility] CATH LAB;  Service: Cardiovascular;  Laterality: N/A; HPI: Pt is an 85 y.o. Martinez who presented with R sided weakness and speech difficulties. PMH is significant for  Afib on eliquis, COPD, DM, HTN, CAD, h/o CVA. 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  Subjective: alert, communicative Assessment / Plan / Recommendation CHL IP CLINICAL IMPRESSIONS 07/11/2018 Clinical Impression Pt presents with functional oropharyngeal swallow marked by prolonged, piece-meal bolusing of regular solids (ultimately effective); adequate propulsion of all materials through pharynx with no residue post-swallow.  There was adequate laryngeal vestibule closure with only occasional penetration of thin liquids (PAS #2).  Penetration did not reach the vocal folds; there was no aspiration.  13 mm barium pill passed readily without deficit.  Recommend resuming a regular diet with thin liquids; pills whole with liquid.  No SLP f/u is needed.  SLP Visit Diagnosis Dysphagia, unspecified (R13.10) Attention and concentration deficit following -- Frontal lobe and executive function deficit following -- Impact on safety and function No limitations   CHL IP TREATMENT RECOMMENDATION 07/11/2018 Treatment Recommendations No treatment recommended at this time   No flowsheet data found. CHL IP DIET RECOMMENDATION 07/11/2018 SLP Diet Recommendations Regular solids;Thin liquid Liquid Administration via Cup;Straw Medication Administration Whole meds with liquid Compensations -- Postural Changes --   CHL IP OTHER RECOMMENDATIONS 07/11/2018 Recommended Consults -- Oral Care Recommendations Oral care BID Other Recommendations Order thickener from pharmacy   CHL IP FOLLOW UP RECOMMENDATIONS 07/11/2018 Follow up Recommendations None   No flowsheet data found.     CHL IP ORAL PHASE  07/11/2018 Oral Phase Impaired Oral - Pudding Teaspoon -- Oral - Pudding Cup -- Oral - Honey Teaspoon -- Oral - Honey Cup -- Oral - Nectar Teaspoon -- Oral - Nectar Cup -- Oral - Nectar Straw -- Oral - Thin Teaspoon -- Oral - Thin Cup -- Oral - Thin Straw -- Oral - Puree -- Oral - Mech Soft -- Oral - Regular Piecemeal swallowing;Delayed oral transit Oral - Multi-Consistency -- Oral - Pill -- Oral Phase - Comment --  CHL IP PHARYNGEAL PHASE 07/11/2018 Pharyngeal Phase WFL Pharyngeal- Pudding Teaspoon -- Pharyngeal -- Pharyngeal- Pudding Cup -- Pharyngeal -- Pharyngeal- Honey Teaspoon -- Pharyngeal -- Pharyngeal- Honey Cup -- Pharyngeal -- Pharyngeal- Nectar Teaspoon -- Pharyngeal -- Pharyngeal- Nectar Cup -- Pharyngeal -- Pharyngeal- Nectar Straw -- Pharyngeal -- Pharyngeal- Thin Teaspoon -- Pharyngeal -- Pharyngeal- Thin Cup -- Pharyngeal -- Pharyngeal- Thin Straw -- Pharyngeal -- Pharyngeal- Puree -- Pharyngeal -- Pharyngeal- Mechanical Soft -- Pharyngeal -- Pharyngeal- Regular -- Pharyngeal -- Pharyngeal- Multi-consistency -- Pharyngeal -- Pharyngeal- Pill -- Pharyngeal -- Pharyngeal Comment --  CHL IP CERVICAL ESOPHAGEAL PHASE 07/11/2018 Cervical Esophageal Phase WFL Pudding Teaspoon -- Pudding Cup -- Honey Teaspoon -- Honey Cup -- Nectar Teaspoon -- Nectar Cup -- Nectar Straw -- Thin Teaspoon -- Thin Cup -- Thin Straw -- Puree -- Mechanical Soft -- Regular -- Multi-consistency -- Pill -- Cervical Esophageal Comment -- Blenda Mounts Laurice 07/11/2018, 1:15 PM               Assessment/Plan  1. Hyperkalemia - had K  5.9 and was given Kayexalate X 2, repeat K 4.4 (normal)  2. Enterococcus UTI - no dysuria nor hematuria, continue Augmentin for 1 more week  3. Atrial fibrillation with RVR (HCC) -Rate controlled, continue metoprolol and Eliquis  4. Essential hypertension -Well-controlled, continue metoprolol, amlodipine, lisinopril and hydrochlorothiazide  5. Benign prostatic hyperplasia, unspecified  whether lower urinary tract symptoms present -Denies urinary retention, continue finasteride and tamsulosin  6. Hypothyroidism, unspecified type Lab Results  Component Value  Date   TSH 2.574 07/11/2018  -Continue Synthroid  7. Uncontrolled type 2 diabetes mellitus with hyperglycemia (HCC) Lab Results  Component Value Date   HGBA1C 7.9 (H) 07/10/2018  -Continue metformin    Family/ staff Communication:   Discussed plan of care with patient and charge nurse.  Labs/tests ordered:  None  Goals of care:   Short-term care   Kenard Gower, DNP, FNP-BC [facility] [phone] (Monday-Friday 8:00 a.m. - 5:00 p.m.) [phone] (after hours)

## 2018-08-12 ENCOUNTER — Telehealth: Payer: Self-pay | Admitting: Family Medicine

## 2018-08-12 NOTE — Progress Notes (Signed)
Virtual Visit via Video Note   This visit type was conducted due to national recommendations for restrictions regarding the COVID-19 Pandemic (e.g. social distancing) in an effort to limit this patient's exposure and mitigate transmission in our community.  Due to his co-morbid illnesses, this patient is at least at moderate risk for complications without adequate follow up.  This format is felt to be most appropriate for this patient at this time.  All issues noted in this document were discussed and addressed.  A limited physical exam was performed with this format.  Please refer to the patient's chart for his consent to telehealth for [facility].   Date:  08/15/2018   ID:  [name] Elizabeth, DOB 10/28/1927, MRN [id number]  Patient Location: Home Provider Location: Home  PCP:  Bing Neighbors, FNP  Cardiologist:  Olga Millers, MD   Evaluation Performed:  Follow-Up Visit  Chief Complaint:  FU CP and atrial fibrillation  History of Present Illness:    Patient had cardiac catheterization January 2015.  At that time he was found to have a 30% LAD, 30% first diagonal, 30% ramus, 40% circumflex, 20% right coronary artery and ejection fraction 55 to 65%.  Echocardiogram February 2017 showed vigorous LV function, grade 1 diastolic dysfunction and mild left atrial enlargement.    Admitted January 2020 with chest pain and atrial fibrillation.  Converted to sinus rhythm.  Enzymes negative.  Nuclear study January 2020 showed ejection fraction 55%.  There was mild reversibility in the inferoseptum.  I reviewed the study and was not convinced there was ischemia and felt it was low risk.  Treated medically. Echocardiogram April 2020 showed normal LV function, grade 1 diastolic dysfunction.  Had CVA April 2020.  Apixaban continued.  He was in atrial fibrillation at that time.  Patient readmitted April with confusion.  Again admitted may with chest pain and enzymes negative.  Spoke with patient's  granddaughter on the phone.  Patient apparently does not like to speak on the phone.  Since last hospitalization he has not had chest pain and and there is no dyspnea, palpitations or syncope.  No bleeding.  He does not fall.  We might do if she still having persistent sinus rhythm  The patient does not have symptoms concerning for COVID-19 infection (fever, chills, cough, or new shortness of breath).    Past Medical History:  Diagnosis Date  . Atrial fibrillation (HCC)   . COPD (chronic obstructive pulmonary disease) (HCC)   . CVA (cerebral infarction)   . Diabetes mellitus without complication (HCC)   . Essential hypertension, benign   . Hard of hearing    Past Surgical History:  Procedure Laterality Date  . HERNIA REPAIR    . LEFT HEART CATHETERIZATION WITH CORONARY ANGIOGRAM N/A 03/28/2013   Procedure: LEFT HEART CATHETERIZATION WITH CORONARY ANGIOGRAM;  Surgeon: Peter M [place], MD;  Location: [facility] CATH LAB;  Service: Cardiovascular;  Laterality: N/A;     Current Meds  Medication Sig  . amLODipine (NORVASC) 5 MG tablet Take 1 tablet (5 mg total) by mouth daily.  Marland Kitchen amoxicillin-clavulanate (AUGMENTIN) 875-125 MG tablet Take 1 tablet by mouth every 12 (twelve) hours for 14 doses.  Marland Kitchen apixaban (ELIQUIS) 5 MG TABS tablet Take 1 tablet (5 mg total) by mouth 2 (two) times daily.  Marland Kitchen atorvastatin (LIPITOR) 80 MG tablet Take 1 tablet (80 mg total) by mouth daily at 6 PM.  . brimonidine (ALPHAGAN) 0.15 % ophthalmic solution Place 1 drop into both eyes  2 (two) times daily.  . budesonide-formoterol (SYMBICORT) 80-4.5 MCG/ACT inhaler Inhale 2 puffs into the lungs 2 (two) times daily.  . Carboxymethylcellulose Sodium 0.25 % SOLN Place 2 drops into both eyes 4 (four) times daily as needed (for dry eyes).  . finasteride (PROSCAR) 5 MG tablet Take 1 tablet (5 mg total) by mouth daily.  . hydrochlorothiazide (HYDRODIURIL) 25 MG tablet Take 0.5 tablets (12.5 mg total) by mouth daily.  .  Ipratropium-Albuterol (COMBIVENT RESPIMAT) 20-100 MCG/ACT AERS respimat Inhale 1 puff into the lungs every 6 (six) hours as needed for wheezing.  . levalbuterol (XOPENEX) 0.63 MG/3ML nebulizer solution Take 3 mLs (0.63 mg total) by nebulization every 6 (six) hours as needed for wheezing or shortness of breath.  . levothyroxine (SYNTHROID) 25 MCG tablet Take 2 tablets (50 mcg total) by mouth daily before breakfast.  . lisinopril (ZESTRIL) 40 MG tablet Take 0.5 tablets (20 mg total) by mouth daily.  Marland Kitchen. LORazepam (ATIVAN) 0.5 MG tablet Give 1/2 tab = 0.25 mg twice a day as needed for anxiety  . metFORMIN (GLUCOPHAGE) 500 MG tablet Take 1 tablet (500 mg total) by mouth 2 (two) times daily with a meal.  . metoprolol tartrate (LOPRESSOR) 25 MG tablet Take 1 tablet (25 mg total) by mouth 2 (two) times daily.  . Multiple Vitamins-Minerals (MULTIVITAMIN WITH MINERALS) tablet Take 1 tablet by mouth daily.  . nitroGLYCERIN (NITROSTAT) 0.4 MG SL tablet Place 1 tablet (0.4 mg total) under the tongue every 5 (five) minutes as needed for chest pain. Dissolve one tablet under the tongue every 5 minutes as needed for chest pain. Repeat every 5 minutes if needed for a total of 3 tablets in 15 minutes. If no relief, call 911.  . olopatadine (PATANOL) 0.1 % ophthalmic solution Place 1 drop into both eyes 2 (two) times daily.  Marland Kitchen. omeprazole (PRILOSEC) 20 MG capsule Take 1 capsule (20 mg total) by mouth daily. .  . polyvinyl alcohol (LIQUIFILM TEARS) 1.4 % ophthalmic solution Place 1 drop into both eyes 4 (four) times daily.  Marland Kitchen. saccharomyces boulardii (FLORASTOR) 250 MG capsule Take 1 capsule (250 mg total) by mouth 2 (two) times daily for 10 days.  Marland Kitchen. senna (SENOKOT) 8.6 MG TABS tablet Take 2 tablets (17.2 mg total) by mouth at bedtime.  . Skin Protectants, Misc. (MINERIN) CREA Apply topically. APPLY TO TRUNK AND LEGS TWICE A DAY  . tamsulosin (FLOMAX) 0.4 MG CAPS capsule Take 1 capsule (0.4 mg total) by mouth daily.      Allergies:   Patient has no known allergies.   Social History   Tobacco Use  . Smoking status: Former Smoker    Types: Cigarettes  . Smokeless tobacco: Former Engineer, waterUser  Substance Use Topics  . Alcohol use: No  . Drug use: No     Family Hx: The patient's family history includes CAD in his son; Skin cancer in his brother.  ROS:   Please see the history of present illness.    No fevers, chills or productive cough. All other systems reviewed and are negative.  Recent Labs: 02/03/2018: Magnesium 1.6 03/29/2018: B Natriuretic Peptide 95.6 07/11/2018: TSH 2.574 07/31/2018: ALT 15 08/06/2018: BUN 27; Creatinine 1.2; Hemoglobin 13.8; Platelets 213; Potassium 5.9; Sodium 138   Recent Lipid Panel Lab Results  Component Value Date/Time   CHOL 226 (H) 07/10/2018 01:44 PM   TRIG 165 (H) 07/10/2018 01:44 PM   HDL 43 07/10/2018 01:44 PM   CHOLHDL 5.3 07/10/2018 01:44 PM   LDLCALC  150 (H) 07/10/2018 01:44 PM    Wt Readings from Last 3 Encounters:  08/09/18 160 lb (72.6 kg)  08/07/18 160 lb (72.6 kg)  08/06/18 160 lb (72.6 kg)     Objective:    Vital Signs:  BP (!) 129/56   Pulse 66    VITAL SIGNS:  reviewed  No acute distress  ASSESSMENT & PLAN:    1. Chest pain-no further chest pain since recent discharge.  Previous nuclear study felt to be low risk.  Given comorbidities I would like to be conservative.  Continue medical therapy. 2. Atrial fibrillation-patient in sinus rhythm at time of last electrocardiogram.  Plan to continue apixaban at present dose.  Continue metoprolol for rate control if atrial fibrillation recurs. 3. Coronary artery disease-nonobstructive on previous catheterization.  Continue statin.  No aspirin given need for apixaban. 4. Hyperlipidemia-continue statin. 5. Hypertension-patient's blood pressure is controlled.  Continue present medications and follow.  COVID-19 Education: The importance of social distancing was discussed today.  Time:   Today, I have  spent 10 minutes with the patient with telehealth technology discussing the above problems.     Medication Adjustments/Labs and Tests Ordered: Current medicines are reviewed at length with the patient today.  Concerns regarding medicines are outlined above.   Tests Ordered: No orders of the defined types were placed in this encounter.   Medication Changes: No orders of the defined types were placed in this encounter.   Disposition:  Follow up in 6 month(s) she declined seeing the [facility]  Signed, Olga Millers, MD  08/15/2018 8:29 AM    [facility]

## 2018-08-12 NOTE — Telephone Encounter (Signed)
Verbal orders ok

## 2018-08-12 NOTE — Telephone Encounter (Signed)
[facility]rey [facility] from [facility] was calling to receive verbal order for this patient, please follow up

## 2018-08-12 NOTE — Telephone Encounter (Signed)
Called [name] back to get clarification on verbal orders.  He is wanting skilled nursing to go out 1-2 times a week, PT 1 time a week until patient gets strength back & OT 2 times a week since patient is currently nonambulatory.  Please advise.

## 2018-08-12 NOTE — Telephone Encounter (Signed)
Called Jeff back & gave verbal ok for requested orders.

## 2018-08-13 ENCOUNTER — Telehealth: Payer: Self-pay | Admitting: Cardiology

## 2018-08-14 ENCOUNTER — Telehealth: Payer: Self-pay | Admitting: Family Medicine

## 2018-08-14 ENCOUNTER — Ambulatory Visit (INDEPENDENT_AMBULATORY_CARE_PROVIDER_SITE_OTHER): Payer: Medicare Other | Admitting: Family Medicine

## 2018-08-14 ENCOUNTER — Encounter: Payer: Self-pay | Admitting: Family Medicine

## 2018-08-14 ENCOUNTER — Other Ambulatory Visit: Payer: Self-pay

## 2018-08-14 DIAGNOSIS — R319 Hematuria, unspecified: Secondary | ICD-10-CM | POA: Diagnosis not present

## 2018-08-14 DIAGNOSIS — R4182 Altered mental status, unspecified: Secondary | ICD-10-CM

## 2018-08-14 DIAGNOSIS — F5104 Psychophysiologic insomnia: Secondary | ICD-10-CM | POA: Diagnosis not present

## 2018-08-14 DIAGNOSIS — I2510 Atherosclerotic heart disease of native coronary artery without angina pectoris: Secondary | ICD-10-CM | POA: Diagnosis not present

## 2018-08-14 DIAGNOSIS — N390 Urinary tract infection, site not specified: Secondary | ICD-10-CM | POA: Diagnosis not present

## 2018-08-14 DIAGNOSIS — Z7409 Other reduced mobility: Secondary | ICD-10-CM

## 2018-08-14 NOTE — Telephone Encounter (Signed)
Call daughter's smartphone/ consent/ my chart via text to daughter/ pre reg completed

## 2018-08-14 NOTE — Telephone Encounter (Signed)
Verbal orders approved.

## 2018-08-14 NOTE — Telephone Encounter (Signed)
Called [place] to give her verbal ok.

## 2018-08-14 NOTE — Progress Notes (Signed)
Virtual Visit via Telephone Note  I connected with [name] and daughter [name] on 08/14/18 at 11:10 AM EDT by telephone and verified that I am speaking with the correct person using two identifiers.    Speaking mostly with [name] although patient is present in background during encounter.  Location: Patient: Located at home during today's encounter  Provider:Located at primary care office    I discussed the limitations, risks, security and privacy concerns of performing an evaluation and management service by telephone and the availability of in person appointments. I also discussed with the patient that there may be a patient responsible charge related to this service. The patient expressed understanding and agreed to proceed.   Medical problems include: has COPD exacerbation (HCC); Diabetes mellitus type II, uncontrolled (HCC); Hypothyroidism; BPH (benign prostatic hyperplasia); HTN (hypertension); Unstable angina (HCC); Bronchiectasis with acute exacerbation (HCC); Atrial fibrillation (HCC); RBBB; Prolonged Q-T interval on ECG; CAD (coronary artery disease); History of CVA (cerebrovascular accident); Atrial fibrillation with RVR (HCC); Chest pain; Requires supplemental oxygen; Stroke (HCC); Altered mental status; AMS (altered mental status); Malnutrition of moderate degree; Urinary tract infection with hematuria; Enterococcus UTI; and Neurocognitive deficits on their problem list.  History of Present Illness: [name] is present for today's encounter for hospital follow-up. Over the last last   Recently s/p CVA 07/10/18 [name] has had 6 ER visits since 07/10/18. He also has had a total of 4 admission within the last 6 months. He is cared for at home by his son and daughter in law. Last impatient admission occurred 07/31/18 in which he was admitted for chest pain, UTI, with confusion. He was treated medically for UTI.  Chest pain deemed not to be result of ACS-cardiac enzymes  negative x3 and vital signs remained stable.  Chest pain resolved. Given the frequency of recent hospital visit and admission, SNF was recommended for OT/PT. Per daughter [name], patient was only admitted to SNF for 1 week due his positive progression back to his normal baseline once UTI was treated. He has been home for almost 1 week and is not receiving in home OT, PT, and RN [facility] services. He is not experiencing any urinary retention symptoms. He is ambulating better and is able to go outside in his yard walk around with family close by. He is eating at his normal baseline and insomnia symptoms have improved. Patient denies chest pain or shortness of breath. He is scheduled to follow-up with neurology on 08/30/18 for post CVA evaluation. No other concerns today.  Assessment and Plan: 1. Urinary tract infection with hematuria, site unspecified -Resolved.  2. Altered mental status, unspecified altered mental status type -Resolved. Likely secondary to recent UTI  3. Psychophysiological insomnia -Continue Hydroxyzine PRN at bedtime or with agitation   4. Impaired mobility and ADLs -Continue in home PT and OT to improve ability to safely ambulate and complete ADLs safely and reduce risk of falling.  Follow Up Instructions: Face to face follow-up 09/05/2018   I discussed the assessment and treatment plan with the patient. The patient was provided an opportunity to ask questions and all were answered. The patient agreed with the plan and demonstrated an understanding of the instructions.   The patient was advised to call back or seek an in-person evaluation if the symptoms worsen or if the condition fails to improve as anticipated.  I provided 20 minutes of non-face-to-face time during this encounter.   Joaquin Courts, FNP-C

## 2018-08-14 NOTE — Progress Notes (Deleted)
Called patient to initiate their telephone visit with provider Joaquin Courts, FNP-C. Verified date of birth. Spoke with [name], she states that patient has done a complete turnaround. He hasn't had any confusion, chest pain, issues with urinating. OT, PT & [facility] nurse have all been out. PT is in home currently. KWalker, CMA.

## 2018-08-14 NOTE — Telephone Encounter (Signed)
Shaunda from [facility] was calling to request verbal orders for this patient, please follow up.

## 2018-08-14 NOTE — Telephone Encounter (Signed)
Daughter in law called back returning Nicole's call

## 2018-08-14 NOTE — Telephone Encounter (Signed)
Requesting orders for [facility] nursing as follows 1 time a week for 5 weeks, 1 time every other week for 4 weeks & 2 PRN visit.  Please advise.

## 2018-08-15 ENCOUNTER — Telehealth (INDEPENDENT_AMBULATORY_CARE_PROVIDER_SITE_OTHER): Payer: Medicare Other | Admitting: Cardiology

## 2018-08-15 ENCOUNTER — Encounter: Payer: Self-pay | Admitting: Cardiology

## 2018-08-15 VITALS — BP 129/56 | HR 66

## 2018-08-15 DIAGNOSIS — E7800 Pure hypercholesterolemia, unspecified: Secondary | ICD-10-CM

## 2018-08-15 DIAGNOSIS — I10 Essential (primary) hypertension: Secondary | ICD-10-CM

## 2018-08-15 DIAGNOSIS — I480 Paroxysmal atrial fibrillation: Secondary | ICD-10-CM

## 2018-08-15 DIAGNOSIS — R072 Precordial pain: Secondary | ICD-10-CM | POA: Diagnosis not present

## 2018-08-15 DIAGNOSIS — I2510 Atherosclerotic heart disease of native coronary artery without angina pectoris: Secondary | ICD-10-CM

## 2018-08-15 NOTE — Patient Instructions (Signed)

## 2018-08-21 ENCOUNTER — Telehealth: Payer: Self-pay | Admitting: Family Medicine

## 2018-08-21 NOTE — Telephone Encounter (Signed)
When [name] arrived the patient stated that he wasn't feeling well. [name] states that he is having some wheezing in the left lung and balance issues, please follow up.

## 2018-08-21 NOTE — Telephone Encounter (Signed)
[place] visit scheduled for 3:10 pm on 08/22/2018

## 2018-08-21 NOTE — Telephone Encounter (Signed)
If patient is experiencing any labored breathing or recent change in gait I would recommend evaluation at the ER.  If he is not taken to the ER please schedule him for a telemedicine visit tomorrow preferably with a video capability so that I can appropriately assess patient

## 2018-08-21 NOTE — Telephone Encounter (Signed)
Clydie Braun states that patient's breathing was off & he wasn't able to walk as normally as he was last visit.  [name] states that patient's breathing is ok, he just has a little congestion. His balance isn't the best but she just thinks that he's tired lately.  Please advise.

## 2018-08-22 ENCOUNTER — Ambulatory Visit: Payer: Medicare Other | Admitting: Family Medicine

## 2018-08-27 ENCOUNTER — Encounter (HOSPITAL_COMMUNITY): Payer: Self-pay

## 2018-08-27 ENCOUNTER — Telehealth: Payer: Self-pay

## 2018-08-27 ENCOUNTER — Other Ambulatory Visit: Payer: Self-pay

## 2018-08-27 ENCOUNTER — Telehealth: Payer: Self-pay | Admitting: Family Medicine

## 2018-08-27 ENCOUNTER — Emergency Department (HOSPITAL_COMMUNITY)
Admission: EM | Admit: 2018-08-27 | Discharge: 2018-08-27 | Disposition: A | Discharge status: Home / Self Care | Payer: Medicare Other | Attending: Emergency Medicine | Admitting: Emergency Medicine

## 2018-08-27 ENCOUNTER — Emergency Department (HOSPITAL_COMMUNITY): Payer: Medicare Other

## 2018-08-27 DIAGNOSIS — Z7984 Long term (current) use of oral hypoglycemic drugs: Secondary | ICD-10-CM | POA: Insufficient documentation

## 2018-08-27 DIAGNOSIS — Z87891 Personal history of nicotine dependence: Secondary | ICD-10-CM | POA: Insufficient documentation

## 2018-08-27 DIAGNOSIS — E119 Type 2 diabetes mellitus without complications: Secondary | ICD-10-CM | POA: Diagnosis not present

## 2018-08-27 DIAGNOSIS — Z8673 Personal history of transient ischemic attack (TIA), and cerebral infarction without residual deficits: Secondary | ICD-10-CM | POA: Insufficient documentation

## 2018-08-27 DIAGNOSIS — F0390 Unspecified dementia without behavioral disturbance: Secondary | ICD-10-CM | POA: Insufficient documentation

## 2018-08-27 DIAGNOSIS — I10 Essential (primary) hypertension: Secondary | ICD-10-CM | POA: Diagnosis not present

## 2018-08-27 DIAGNOSIS — Z20828 Contact with and (suspected) exposure to other viral communicable diseases: Secondary | ICD-10-CM | POA: Diagnosis not present

## 2018-08-27 DIAGNOSIS — Z7901 Long term (current) use of anticoagulants: Secondary | ICD-10-CM | POA: Diagnosis not present

## 2018-08-27 DIAGNOSIS — E039 Hypothyroidism, unspecified: Secondary | ICD-10-CM | POA: Diagnosis not present

## 2018-08-27 DIAGNOSIS — R0602 Shortness of breath: Secondary | ICD-10-CM | POA: Diagnosis present

## 2018-08-27 DIAGNOSIS — Z79899 Other long term (current) drug therapy: Secondary | ICD-10-CM | POA: Diagnosis not present

## 2018-08-27 LAB — COMPREHENSIVE METABOLIC PANEL WITH GFR
ALT: 17 U/L (ref 0–44)
AST: 20 U/L (ref 15–41)
Albumin: 3.4 g/dL — ABNORMAL LOW (ref 3.5–5.0)
Alkaline Phosphatase: 54 U/L (ref 38–126)
Anion gap: 8 (ref 5–15)
BUN: 17 mg/dL (ref 8–23)
CO2: 27 mmol/L (ref 22–32)
Calcium: 9.3 mg/dL (ref 8.9–10.3)
Chloride: 104 mmol/L (ref 98–111)
Creatinine, Ser: 1.03 mg/dL (ref 0.61–1.24)
GFR calc Af Amer: >60 mL/min (ref >60)
GFR calc non Af Amer: >60 mL/min (ref >60)
Glucose, Bld: 130 mg/dL — ABNORMAL HIGH (ref 70–99)
Potassium: 3.8 mmol/L (ref 3.5–5.1)
Sodium: 139 mmol/L (ref 135–145)
Total Bilirubin: 1 mg/dL (ref 0.3–1.2)
Total Protein: 6.5 g/dL (ref 6.5–8.1)

## 2018-08-27 LAB — I-STAT TROPONIN, ED: Troponin i, poc: 0.02 ng/mL (ref 0.00–0.08)

## 2018-08-27 LAB — CBC WITH DIFFERENTIAL/PLATELET
Abs Immature Granulocytes: 0.02 10*3/uL (ref 0.00–0.07)
Basophils Absolute: 0.1 10*3/uL (ref 0.0–0.1)
Basophils Relative: 1 %
Eosinophils Absolute: 0.4 10*3/uL (ref 0.0–0.5)
Eosinophils Relative: 6 %
HCT: 37.3 % — ABNORMAL LOW (ref 39.0–52.0)
Hemoglobin: 11.9 g/dL — ABNORMAL LOW (ref 13.0–17.0)
Immature Granulocytes: 0 %
Lymphocytes Relative: 16 %
Lymphs Abs: 1 10*3/uL (ref 0.7–4.0)
MCH: 28.7 pg (ref 26.0–34.0)
MCHC: 31.9 g/dL (ref 30.0–36.0)
MCV: 89.9 fL (ref 80.0–100.0)
Monocytes Absolute: 0.6 10*3/uL (ref 0.1–1.0)
Monocytes Relative: 9 %
Neutro Abs: 4.3 10*3/uL (ref 1.7–7.7)
Neutrophils Relative %: 68 %
Platelets: 175 10*3/uL (ref 150–400)
RBC: 4.15 MIL/uL — ABNORMAL LOW (ref 4.22–5.81)
RDW: 13.7 % (ref 11.5–15.5)
WBC: 6.4 10*3/uL (ref 4.0–10.5)
nRBC: 0 % (ref 0.0–0.2)

## 2018-08-27 LAB — POCT I-STAT EG7
Acid-Base Excess: 2 mmol/L (ref 0.0–2.0)
Bicarbonate: 27.4 mmol/L (ref 20.0–28.0)
Calcium, Ion: 1.27 mmol/L (ref 1.15–1.40)
HCT: 35 % — ABNORMAL LOW (ref 39.0–52.0)
Hemoglobin: 11.9 g/dL — ABNORMAL LOW (ref 13.0–17.0)
O2 Saturation: 29 %
Potassium: 3.8 mmol/L (ref 3.5–5.1)
Sodium: 139 mmol/L (ref 135–145)
TCO2: 29 mmol/L (ref 22–32)
pCO2, Ven: 45 mmHg (ref 44.0–60.0)
pH, Ven: 7.393 (ref 7.250–7.430)
pO2, Ven: 19 mmHg — CL (ref 32.0–45.0)

## 2018-08-27 LAB — URINALYSIS, ROUTINE W REFLEX MICROSCOPIC
Bacteria, UA: NONE SEEN
Bilirubin Urine: NEGATIVE
Glucose, UA: NEGATIVE mg/dL
Hgb urine dipstick: NEGATIVE
Ketones, ur: NEGATIVE mg/dL
Leukocytes,Ua: NEGATIVE
Nitrite: NEGATIVE
Protein, ur: 30 mg/dL — AB
Specific Gravity, Urine: 1.011 (ref 1.005–1.030)
pH: 7 (ref 5.0–8.0)

## 2018-08-27 LAB — SARS CORONAVIRUS 2 BY RT PCR (HOSPITAL ORDER, PERFORMED IN ~~LOC~~ HOSPITAL LAB): SARS Coronavirus 2: NEGATIVE

## 2018-08-27 LAB — BRAIN NATRIURETIC PEPTIDE: B Natriuretic Peptide: 606.5 pg/mL — ABNORMAL HIGH (ref 0.0–100.0)

## 2018-08-27 MED ORDER — DOXYCYCLINE HYCLATE 100 MG PO TABS
100.0000 mg | ORAL_TABLET | Freq: Once | ORAL | Status: AC
  Administered 2018-08-27: 100 mg via ORAL
  Filled 2018-08-27: qty 1

## 2018-08-27 MED ORDER — FUROSEMIDE 20 MG PO TABS: 20.0000 mg | ORAL_TABLET | Freq: Every day | ORAL | 0 refills | Status: DC

## 2018-08-27 MED ORDER — DOXYCYCLINE HYCLATE 100 MG PO CAPS: 100.0000 mg | ORAL_CAPSULE | Freq: Two times a day (BID) | ORAL | 0 refills | Status: AC

## 2018-08-27 MED ORDER — METOPROLOL TARTRATE 5 MG/5ML IV SOLN: 5.0000 mg | Freq: Once | INTRAVENOUS | Status: DC

## 2018-08-27 MED ORDER — FUROSEMIDE 10 MG/ML IJ SOLN
20.0000 mg | Freq: Once | INTRAMUSCULAR | Status: AC
  Administered 2018-08-27: 20 mg via INTRAVENOUS
  Filled 2018-08-27: qty 2

## 2018-08-27 NOTE — ED Notes (Signed)
Called daughter in law.  Discharge instructions provided over the phone and that medications need to be picked up from the pharmacy

## 2018-08-27 NOTE — Telephone Encounter (Signed)
Montel Clock,  Patient needs to go to the ER. This was advised last week given his symptoms of shortness of breath and balance instability, history of recent CVA,  ER evaluation was warranted. Patient (daughter-in law) no showed video visit last week. Given the persistent symptoms of respiratory distress patient needs to be evaluated in the ER for possible pneumonia and COVID testing. If he refuses, I would recommend calling EMS to transport patient to ER.   Joaquin Courts, FNP

## 2018-08-27 NOTE — Discharge Instructions (Addendum)
You have been seen today for shortness of breath. Please read and follow all provided instructions. Return to the emergency room for worsening condition or new concerning symptoms.    Prescription has been sent to your pharmacy for doxycycline.  This is an antibiotic for pneumonia, please take it as prescribed.  You had your first dose in the emergency department today at 2pm. You can take your second dose at bedtime.  A prescription has been sent to your pharmacy also for Lasix.  This is a pill to help your body get rid of extra fluid.  Please take daily for the next 3 days, take as prescribed. You will probably pee more while taking this pill.  -Continue usual home medications  2. Treatment: rest  3. Follow Up: Please follow up with your primary doctor in 2-5 days for discussion of your diagnoses and further evaluation after today's visit; Call today to arrange your follow up.    ?

## 2018-08-27 NOTE — Telephone Encounter (Signed)
Spoke with the patient and his daughter in Social worker. He has given vetrbal consent to file his insurance and to do a doxy.me visit with Shanda Bumps. E-mail has been confirmed and the link has been sent.   E-mail: [email or web address]

## 2018-08-27 NOTE — ED Triage Notes (Signed)
Shortness of Breath  For a couple of days, dr referred to ER

## 2018-08-27 NOTE — ED Provider Notes (Signed)
[facility] EMERGENCY DEPARTMENT Provider Note   CSN: [id number] Arrival date & time: 08/27/18  1121    History   Chief Complaint Chief Complaint  Patient presents with  . Shortness of Breath    HPI NOTNAMED [name] is a 85 y.o. male with history of CVA, A. fib on Eliquis, COPD, diabetes, AMS presents emergency department today with chief complaint of shortness of breath x2 days.  Patient was referred by PCP to emergency department.  He is unable to give history secondary to AMS, level 5 caveat.  Pt lives with daughter in law [name] who contributes to history over the phone.  States over the last week patient has had progressively worsening shortness of breath.  He states at night it is difficult to breathe. He has been using nebulizer treatments throughout the day without symptom improvement.  Also reports nonproductive cough x1 week.  Denies fever, vomiting. She denies pt complaining of chest pain, abdominal pain.  Wears prn O2 3 L however lately has been using it most of the day. Home health aid concerned for pneumonia, fluid overload, covid.    Past Medical History:  Diagnosis Date  . Atrial fibrillation (HCC)   . COPD (chronic obstructive pulmonary disease) (HCC)   . CVA (cerebral infarction)   . Diabetes mellitus without complication (HCC)   . Essential hypertension, benign   . Hard of hearing     Patient Active Problem List   Diagnosis Date Noted  . Enterococcus UTI 08/07/2018  . Neurocognitive deficits 08/07/2018  . Urinary tract infection with hematuria   . Malnutrition of moderate degree 07/31/2018  . AMS (altered mental status) 07/14/2018  . Altered mental status   . Stroke (HCC) 07/10/2018  . Requires supplemental oxygen 06/05/2018  . Chest pain 03/29/2018  . Atrial fibrillation (HCC) 05/20/2015  . RBBB 05/20/2015  . Prolonged Q-T interval on ECG 05/20/2015  . CAD (coronary artery disease) 05/20/2015  . History of CVA (cerebrovascular  accident) 05/20/2015  . Atrial fibrillation with RVR (HCC)   . Bronchiectasis with acute exacerbation (HCC)   . Unstable angina (HCC) 03/27/2013  . COPD exacerbation (HCC) 03/25/2013  . Diabetes mellitus type II, uncontrolled (HCC) 03/25/2013  . Hypothyroidism 03/25/2013  . BPH (benign prostatic hyperplasia) 03/25/2013  . HTN (hypertension) 03/25/2013    Past Surgical History:  Procedure Laterality Date  . HERNIA REPAIR    . LEFT HEART CATHETERIZATION WITH CORONARY ANGIOGRAM N/A 03/28/2013   Procedure: LEFT HEART CATHETERIZATION WITH CORONARY ANGIOGRAM;  Surgeon: Peter M Swazil[place], MD;  Location: [facility] CATH LAB;  Service: Cardiovascular;  Laterality: N/A;        Home Medications    Prior to Admission medications   Medication Sig Start Date End Date Taking? Authorizing Provider  amLODipine (NORVASC) 5 MG tablet Take 1 tablet (5 mg total) by mouth daily. 08/08/18   Edmon Crape C, PA-C  apixaban (ELIQUIS) 5 MG TABS tablet Take 1 tablet (5 mg total) by mouth 2 (two) times daily. 08/08/18   Edmon Crape C, PA-C  atorvastatin (LIPITOR) 80 MG tablet Take 1 tablet (80 mg total) by mouth daily at 6 PM. 08/08/18   Trudie Reed, Arlo C, PA-C  brimonidine (ALPHAGAN) 0.15 % ophthalmic solution Place 1 drop into both eyes 2 (two) times daily. 08/08/18   Edmon Crape C, PA-C  budesonide-formoterol (SYMBICORT) 80-4.5 MCG/ACT inhaler Inhale 2 puffs into the lungs 2 (two) times daily. 08/08/18   Edmon Crape C, PA-C  Carboxymethylcellulose Sodium 0.25 % SOLN  Place 2 drops into both eyes 4 (four) times daily as needed (for dry eyes). 08/08/18   Edmon Crape C, PA-C  doxycycline (VIBRAMYCIN) 100 MG capsule Take 1 capsule (100 mg total) by mouth 2 (two) times daily for 7 days. 08/27/18 09/03/18  Chastin Garlitz E, PA-C  finasteride (PROSCAR) 5 MG tablet Take 1 tablet (5 mg total) by mouth daily. 08/08/18   Edmon Crape C, PA-C  furosemide (LASIX) 20 MG tablet Take 1 tablet (20 mg total) by mouth daily for 3 days. 08/27/18  08/30/18  Efren Kross E, PA-C  hydrochlorothiazide (HYDRODIURIL) 25 MG tablet Take 0.5 tablets (12.5 mg total) by mouth daily. 08/08/18   Edmon Crape C, PA-C  Ipratropium-Albuterol (COMBIVENT RESPIMAT) 20-100 MCG/ACT AERS respimat Inhale 1 puff into the lungs every 6 (six) hours as needed for wheezing. 08/08/18   Edmon Crape C, PA-C  levalbuterol (XOPENEX) 0.63 MG/3ML nebulizer solution Take 3 mLs (0.63 mg total) by nebulization every 6 (six) hours as needed for wheezing or shortness of breath. 08/08/18   Roena Malady, PA-C  levothyroxine (SYNTHROID) 25 MCG tablet Take 2 tablets (50 mcg total) by mouth daily before breakfast. 08/08/18   Edmon Crape C, PA-C  lisinopril (ZESTRIL) 40 MG tablet Take 0.5 tablets (20 mg total) by mouth daily. 08/08/18   Edmon Crape C, PA-C  metFORMIN (GLUCOPHAGE) 500 MG tablet Take 1 tablet (500 mg total) by mouth 2 (two) times daily with a meal. 08/08/18   Trudie Reed, Arlo C, PA-C  metoprolol tartrate (LOPRESSOR) 25 MG tablet Take 1 tablet (25 mg total) by mouth 2 (two) times daily. 08/08/18   Edmon Crape C, PA-C  Multiple Vitamins-Minerals (MULTIVITAMIN WITH MINERALS) tablet Take 1 tablet by mouth daily. 08/08/18   Edmon Crape C, PA-C  nitroGLYCERIN (NITROSTAT) 0.4 MG SL tablet Place 1 tablet (0.4 mg total) under the tongue every 5 (five) minutes as needed for chest pain. Dissolve one tablet under the tongue every 5 minutes as needed for chest pain. Repeat every 5 minutes if needed for a total of 3 tablets in 15 minutes. If no relief, call 911. 08/08/18   Edmon Crape C, PA-C  olopatadine (PATANOL) 0.1 % ophthalmic solution Place 1 drop into both eyes 2 (two) times daily. 08/08/18   Edmon Crape C, PA-C  omeprazole (PRILOSEC) 20 MG capsule Take 1 capsule (20 mg total) by mouth daily. . 08/08/18   Edmon Crape C, PA-C  polyvinyl alcohol (LIQUIFILM TEARS) 1.4 % ophthalmic solution Place 1 drop into both eyes 4 (four) times daily. 08/08/18   Roena Malady, PA-C  senna (SENOKOT)  8.6 MG TABS tablet Take 2 tablets (17.2 mg total) by mouth at bedtime. 08/08/18   Roena Malady, PA-C  Skin Protectants, Misc. (MINERIN) CREA Apply topically. APPLY TO TRUNK AND LEGS TWICE A DAY    [provider]  tamsulosin (FLOMAX) 0.4 MG CAPS capsule Take 1 capsule (0.4 mg total) by mouth daily. 08/08/18   Roena Malady, PA-C    Family History Family History  Problem Relation Age of Onset  . CAD Son   . Skin cancer Brother     Social History Social History   Tobacco Use  . Smoking status: Former Smoker    Types: Cigarettes  . Smokeless tobacco: Former Engineer, water Use Topics  . Alcohol use: No  . Drug use: No     Allergies   Patient has no known allergies.   Review of Systems Review of Systems  Unable  to perform ROS: Dementia  Respiratory: Positive for cough and shortness of breath.      Physical Exam Updated Vital Signs BP (!) 159/74 (BP Location: Right Arm)   Pulse (!) 108   Temp 98.4 F (36.9 C) (Oral)   Resp (!) 27   Ht  (1.727 m)   Wt 72.6 kg   SpO2 93%   BMI 24.34 kg/m   Physical Exam Vitals signs and nursing note reviewed.  Constitutional:      General: He is not in acute distress.    Appearance: He is not ill-appearing.  HENT:     Head: Normocephalic and atraumatic.     Right Ear: Tympanic membrane and external ear normal.     Left Ear: Tympanic membrane and external ear normal.     Nose: Nose normal.     Mouth/Throat:     Mouth: Mucous membranes are moist.     Pharynx: Oropharynx is clear.  Eyes:     General: No scleral icterus.       Right eye: No discharge.        Left eye: No discharge.     Extraocular Movements: Extraocular movements intact.     Conjunctiva/sclera: Conjunctivae normal.     Pupils: Pupils are equal, round, and reactive to light.  Neck:     Musculoskeletal: Normal range of motion.     Vascular: No JVD.  Cardiovascular:     Rate and Rhythm: Tachycardia present. Rhythm irregular.     Pulses:  Normal pulses.          Radial pulses are 2+ on the right side and 2+ on the left side.     Heart sounds: Normal heart sounds.  Pulmonary:     Comments: Lung sounds diminished throughout. No wheeze, rales or rhonchi noted. SpO2 is 93% on room air. He is speaking in short sentences. No accessory muscle use. Abdominal:     Comments: Abdomen is soft, non-distended, and non-tender in all quadrants. No rigidity, no guarding. No peritoneal signs.  Musculoskeletal: Normal range of motion.     Right lower leg: 1+ Edema present.     Left lower leg: 1+ Edema present.  Skin:    General: Skin is warm and dry.     Capillary Refill: Capillary refill takes less than 2 seconds.  Neurological:     GCS: GCS eye subscore is 4. GCS verbal subscore is 5. GCS motor subscore is 6.     Comments: Fluent speech, no facial droop. CN2-12 grossly intact. He is alert to self and location. Unable to tell year, month, or president.  Psychiatric:        Behavior: Behavior normal.      ED Treatments / Results  Labs (all labs ordered are listed, but only abnormal results are displayed) Labs Reviewed  CBC WITH DIFFERENTIAL/PLATELET - Abnormal; Notable for the following components:      Result Value   RBC 4.15 (*)    Hemoglobin 11.9 (*)    HCT 37.3 (*)    All other components within normal limits  COMPREHENSIVE METABOLIC PANEL - Abnormal; Notable for the following components:   Glucose, Bld 130 (*)    Albumin 3.4 (*)    All other components within normal limits  BRAIN NATRIURETIC PEPTIDE - Abnormal; Notable for the following components:   B Natriuretic Peptide 606.5 (*)    All other components within normal limits  URINALYSIS, ROUTINE W REFLEX MICROSCOPIC - Abnormal; Notable for the following  components:   Protein, ur 30 (*)    All other components within normal limits  POCT I-STAT EG7 - Abnormal; Notable for the following components:   pO2, Ven 19.0 (*)    HCT 35.0 (*)    Hemoglobin 11.9 (*)    All other  components within normal limits  SARS CORONAVIRUS 2 (HOSPITAL ORDER, PERFORMED IN [facility])  BLOOD GAS, VENOUS  I-STAT TROPONIN, ED    EKG EKG Interpretation  Date/Time:  Tuesday August 27 2018 11:36:28 EDT Ventricular Rate:  108 PR Interval:    QRS Duration: 142 QT Interval:  370 QTC Calculation: 496 R Axis:   -87 Text Interpretation:  Atrial fibrillation Ventricular premature complex Right bundle branch block Anterior infarct, old afib new since previous  Confirmed by Richardean Canal ([id number]) on 08/27/2018 11:51:47 AM   Radiology Dg Chest Port 1 View  Result Date: 08/27/2018 CLINICAL DATA:  Shortness of breath. EXAM: PORTABLE CHEST 1 VIEW COMPARISON:  07/31/2018 and 07/19/2018 FINDINGS: The heart size and pulmonary vascularity are normal. There is diffuse chronic accentuation of the interstitial markings. There is a new area of hazy density at the right lung base just above the hemidiaphragm. No discrete effusions.  No appreciable acute bone abnormality. IMPRESSION: 1. Slight increased density at the right lung base which could represent focal atelectasis or small infiltrate. 2. Chronic interstitial lung disease. Electronically Signed   By: Francene Boyers M.D.   On: 08/27/2018 11:59    Procedures Procedures (including critical care time)  Medications Ordered in ED Medications  doxycycline (VIBRA-TABS) tablet 100 mg (100 mg Oral Given 08/27/18 1342)  furosemide (LASIX) injection 20 mg (20 mg Intravenous Given 08/27/18 1342)     Initial Impression / Assessment and Plan / ED Course  I have reviewed the triage vital signs and the nursing notes.  Pertinent labs & imaging results that were available during my care of the patient were reviewed by me and considered in my medical decision making (see chart for details).  On arrival pt is in no acute distress, SpO2 93% on room air. He is non toxic appearing. No signs of volume overload on exam. Lungs diminished throughout, but no  wheeze, rales or rhonchi heard. EKG shows afib, with rate 80-100 at this time. Appears controlled, he takes metoprolol  BID, will consider if needed for rate control.  Have low clinical suspicion for PE.  CBC unremarkable, no leukocytosis. CMP unremarkable. UA without infection. BNP elevated to 606.5, compared to 95.6 x 5 months ago. I stat troponin is 0.02, pt without CP, low suspicion ACS. Coronavirus test is negative. Pt had recent admission 5/6-5/8 for CP and ACS was ruled out. Chest xray viewed by myself and attending Dr Silverio Lay shows infiltrate in right lower lobe, ?pneumoia vs atelctesis.  Pt is overall well appearing, in no respiratory distress while in the ED, SpO2 on room ain staying above 93%. With seeing possible pneumonia on chest xray, will give PO doxycycline and 20 mg IV Lasix to help diurese.  He has good urine output after lasix. Family of patient [name] is aware of case and understands plan of care. Pt discharged home with family with prescription for doxycycline and short course of lasix . Evaluation does not show pathology that would require ongoing emergent intervention or inpatient treatment. I explained the diagnosis to the patient and family. Family is comfortable with above plan and is stable for discharge at this time. All questions were answered prior to disposition.  Strict return precautions for returning to the ED were discussed. Encouraged follow up with PCP. The patient was discussed with and seen by Dr. Silverio LayYao who agrees with the treatment plan.   This note was prepared using Dragon voice recognition software and may include unintentional dictation errors due to the inherent limitations of voice recognition software.     Final Clinical Impressions(s) / ED Diagnoses   Final diagnoses:  SOB (shortness of breath)    ED Discharge Orders         Ordered    furosemide (LASIX) 20 MG tablet  Daily     08/27/18 1419    doxycycline (VIBRAMYCIN) 100 MG capsule  2 times  daily     08/27/18 1419           Lyfe Reihl, LeomaKaitlyn E, PA-C 08/28/18 1115    Charlynne PanderYao, David Hsienta, MD 08/29/18 [id number]

## 2018-08-27 NOTE — Telephone Encounter (Signed)
Notified Lyla Son [name] of the information below. They both state that patient is refusing to go to the ER.  Advised to call EMS per Ms. Harris.

## 2018-08-27 NOTE — Telephone Encounter (Signed)
Patient is having SOB and cough low o2 and diminshed lung sounds. The nurse wants to speak with you [phone]

## 2018-08-29 ENCOUNTER — Inpatient Hospital Stay: Payer: Medicare Other | Admitting: Adult Health

## 2018-08-29 ENCOUNTER — Other Ambulatory Visit: Payer: Self-pay

## 2018-08-30 ENCOUNTER — Inpatient Hospital Stay: Payer: Medicare Other | Admitting: Adult Health

## 2018-09-02 ENCOUNTER — Other Ambulatory Visit: Payer: Self-pay

## 2018-09-02 ENCOUNTER — Inpatient Hospital Stay (INDEPENDENT_AMBULATORY_CARE_PROVIDER_SITE_OTHER): Payer: Medicare Other | Admitting: Adult Health

## 2018-09-02 DIAGNOSIS — Z0289 Encounter for other administrative examinations: Secondary | ICD-10-CM

## 2018-09-02 NOTE — Progress Notes (Unsigned)
[facility] [address] [place]. [place] [address] [phone]       VIRTUAL VISIT FOLLOW UP NOTE  Mr. TYRAY PROCH Date of Birth:  [birth year] Medical Record Number:  [id number]   Reason for Referral:  hospital stroke follow up    Virtual Visit via Video Note  I connected with Joice Lofts on 09/02/18 at  9:15 AM EDT by a video enabled telemedicine application located remotely in my own home and verified that I am speaking with the correct person using two identifiers who was located at their own home.   Visit scheduled by ***. She discussed the limitations of evaluation and management by telemedicine and the availability of in person appointments. The patient expressed understanding and agreed to proceed.Please see telephone note for additional scheduling information and consent.    CHIEF COMPLAINT:  No chief complaint on file.   HPI: BABAK LUCUS was initially scheduled today for in office hospital follow-up regarding left pontine infarct on 07/09/2018 secondary to small vessel disease but due to COVID-19 safety precautions, visit transition to telemedicine via [email or web address] with patients consent. History obtained from *** and chart review. Reviewed all radiology images and labs personally.  Mr. NICOLAUS ANDEL is a 85 y.o. male with history of HOH, prior strokes, COPD, CAD, HTN, DB, HLD, AF on Eliquis  who presented from home who woke with difficulty speaking and R sided weakness.  Stroke work-up revealed left pontine infarct in setting of uncontrolled risk factors and AF on Eliquis as evidenced on MRI.  Vessel imaging showed aortic arthrosclerosis, bilateral ICA arthrosclerosis, chronic left MCA M2 superior division chronic occlusion associated with old left MCA infarct but no evidence of large or medium vessel occlusion.  2D echo unremarkable.  EEG normal.  LDL 150 and A1c 7.9.  Recommended continuation of Eliquis along with increasing atorvastatin dose from  40 mg to 80 mg daily.  HTN stable.  Other stroke affected include advanced age, former tobacco use and history of left MCA infarct.  Residual deficits of expressive aphasia > receptive aphasia, and mild weakness of right grip and hip flexion He unfortunately has had 6 ER visits since 07/10/2018 and a total of 4 admissions within the last 6 months.  Last inpatient admission occurred 07/31/2018 with complaints of chest pain, UTI and confusion.  Medically treated for UTI and all other testing unremarkable. Recommended SNF for ongoing therapy but was discharged back home after 1 week due to possible progression and return to baseline.    He has been stable from a stroke standpoint ***.  Home health therapy ***.  Continues on Eliquis with acute episode of gross hematuria for which she was evaluated in ER on 07/28/2018 which resolved without intervention and denies additional bleeding or bruising.  Continues on atorvastatin 80 mg daily without side effects myalgias.  Blood pressure ***.        ROS:   14 system review of systems performed and negative with exception of ***  PMH:  Past Medical History:  Diagnosis Date  . Atrial fibrillation ([place])   . COPD (chronic obstructive pulmonary disease) ([place])   . CVA (cerebral infarction)   . Diabetes mellitus without complication ([place])   . Essential hypertension, benign   . Hard of hearing     PSH:  Past Surgical History:  Procedure Laterality Date  . HERNIA REPAIR    . LEFT HEART CATHETERIZATION WITH CORONARY ANGIOGRAM N/A 03/28/2013   Procedure: LEFT HEART CATHETERIZATION WITH CORONARY  Rosalin Hawking;  Surgeon: Peter M Swaziland, MD;  Location: [facility] CATH LAB;  Service: Cardiovascular;  Laterality: N/A;    Social History:  Social History   Socioeconomic History  . Marital status: Married    Spouse name: Not on file  . Number of children: Not on file  . Years of education: Not on file  . Highest education level: Not on file  Occupational History  . Not on  file  Social Needs  . Financial resource strain: Not on file  . Food insecurity:    Worry: Not on file    Inability: Not on file  . Transportation needs:    Medical: Not on file    Non-medical: Not on file  Tobacco Use  . Smoking status: Former Smoker    Types: Cigarettes  . Smokeless tobacco: Former Engineer, water and Sexual Activity  . Alcohol use: No  . Drug use: No  . Sexual activity: Not on file  Lifestyle  . Physical activity:    Days per week: Not on file    Minutes per session: Not on file  . Stress: Not on file  Relationships  . Social connections:    Talks on phone: Not on file    Gets together: Not on file    Attends religious service: Not on file    Active member of club or organization: Not on file    Attends meetings of clubs or organizations: Not on file    Relationship status: Not on file  . Intimate partner violence:    Fear of current or ex partner: Not on file    Emotionally abused: Not on file    Physically abused: Not on file    Forced sexual activity: Not on file  Other Topics Concern  . Not on file  Social History Narrative  . Not on file    Family History:  Family History  Problem Relation Age of Onset  . CAD Son   . Skin cancer Brother     Medications:   Current Outpatient Medications on File Prior to Visit  Medication Sig Dispense Refill  . amLODipine (NORVASC) 5 MG tablet Take 1 tablet (5 mg total) by mouth daily. 30 tablet 0  . apixaban (ELIQUIS) 5 MG TABS tablet Take 1 tablet (5 mg total) by mouth 2 (two) times daily. 60 tablet 0  . atorvastatin (LIPITOR) 80 MG tablet Take 1 tablet (80 mg total) by mouth daily at 6 PM. 30 tablet 0  . brimonidine (ALPHAGAN) 0.15 % ophthalmic solution Place 1 drop into both eyes 2 (two) times daily. 5 mL 0  . budesonide-formoterol (SYMBICORT) 80-4.5 MCG/ACT inhaler Inhale 2 puffs into the lungs 2 (two) times daily. 1 Inhaler 0  . Carboxymethylcellulose Sodium 0.25 % SOLN Place 2 drops into both eyes  4 (four) times daily as needed (for dry eyes). 1 each 0  . doxycycline (VIBRAMYCIN) 100 MG capsule Take 1 capsule (100 mg total) by mouth 2 (two) times daily for 7 days. 14 capsule 0  . finasteride (PROSCAR) 5 MG tablet Take 1 tablet (5 mg total) by mouth daily. 30 tablet 0  . furosemide (LASIX) 20 MG tablet Take 1 tablet (20 mg total) by mouth daily for 3 days. 3 tablet 0  . hydrochlorothiazide (HYDRODIURIL) 25 MG tablet Take 0.5 tablets (12.5 mg total) by mouth daily. 15 tablet 0  . Ipratropium-Albuterol (COMBIVENT RESPIMAT) 20-100 MCG/ACT AERS respimat Inhale 1 puff into the lungs every 6 (six) hours as needed  for wheezing. 1 Inhaler 0  . levalbuterol (XOPENEX) 0.63 MG/3ML nebulizer solution Take 3 mLs (0.63 mg total) by nebulization every 6 (six) hours as needed for wheezing or shortness of breath. 180 mL 0  . levothyroxine (SYNTHROID) 25 MCG tablet Take 2 tablets (50 mcg total) by mouth daily before breakfast. 60 tablet 0  . lisinopril (ZESTRIL) 40 MG tablet Take 0.5 tablets (20 mg total) by mouth daily. 15 tablet 0  . metFORMIN (GLUCOPHAGE) 500 MG tablet Take 1 tablet (500 mg total) by mouth 2 (two) times daily with a meal. 60 tablet 0  . metoprolol tartrate (LOPRESSOR) 25 MG tablet Take 1 tablet (25 mg total) by mouth 2 (two) times daily. 60 tablet 0  . Multiple Vitamins-Minerals (MULTIVITAMIN WITH MINERALS) tablet Take 1 tablet by mouth daily. 30 tablet 0  . nitroGLYCERIN (NITROSTAT) 0.4 MG SL tablet Place 1 tablet (0.4 mg total) under the tongue every 5 (five) minutes as needed for chest pain. Dissolve one tablet under the tongue every 5 minutes as needed for chest pain. Repeat every 5 minutes if needed for a total of 3 tablets in 15 minutes. If no relief, call 911. 30 tablet 0  . olopatadine (PATANOL) 0.1 % ophthalmic solution Place 1 drop into both eyes 2 (two) times daily. 5 mL 0  . omeprazole (PRILOSEC) 20 MG capsule Take 1 capsule (20 mg total) by mouth daily. . 30 capsule 0  . polyvinyl  alcohol (LIQUIFILM TEARS) 1.4 % ophthalmic solution Place 1 drop into both eyes 4 (four) times daily. 15 mL 0  . senna (SENOKOT) 8.6 MG TABS tablet Take 2 tablets (17.2 mg total) by mouth at bedtime. 60 tablet 0  . Skin Protectants, Misc. (MINERIN) CREA Apply topically. APPLY TO TRUNK AND LEGS TWICE A DAY    . tamsulosin (FLOMAX) 0.4 MG CAPS capsule Take 1 capsule (0.4 mg total) by mouth daily. 30 capsule 0   No current facility-administered medications on file prior to visit.     Allergies:  No Known Allergies   Physical Exam  There were no vitals filed for this visit. There is no height or weight on file to calculate BMI. No exam data present  Depression screen [facility] 07/22/2018  Decreased Interest 0  Down, Depressed, Hopeless 0  PHQ - 2 Score 0  Altered sleeping 0  Tired, decreased energy 0  Change in appetite 0  Feeling bad or failure about yourself  0  Trouble concentrating 0  Moving slowly or fidgety/restless 0  Suicidal thoughts 0  PHQ-9 Score 0     General: well developed, well nourished, seated, in no evident distress Head: head normocephalic and atraumatic.     Neurologic Exam Mental Status: Awake and fully alert. Oriented to place and time. Recent and remote memory intact. Attention span, concentration and fund of knowledge appropriate. Mood and affect appropriate.  Cranial Nerves: Fundoscopic exam reveals sharp disc margins. Pupils equal, briskly reactive to light. Extraocular movements full without nystagmus. Visual fields full to confrontation. Hearing intact. Facial sensation intact. Face, tongue, palate moves normally and symmetrically.  Motor: Normal bulk and tone. Normal strength in all tested extremity muscles. Sensory.: intact to touch , pinprick , position and vibratory sensation.  Coordination: Rapid alternating movements normal in all extremities. Finger-to-nose and heel-to-shin performed accurately bilaterally. Gait and Station: Arises from chair  without difficulty. Stance is normal. Gait demonstrates normal stride length and balance. Able to heel, toe and tandem walk without difficulty.  Reflexes: 1+ and  symmetric. Toes downgoing.    NIHSS  *** Modified Rankin  *** CHA2DS2-VASc *** HAS-BLED ***   Diagnostic Data (Labs, Imaging, Testing)  CT HEAD WO CONTRAST 07/10/2018 IMPRESSION: 1. No acute finding. No apparent change since March of last year. Old infarction in the left MCA territory. 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 2. ASPECTS is difficult to apply given the extensive old left MCA stroke.  CT ANGIO HEAD W OR WO CONTRAST CT ANGIO NECK W OR WO CONTRAST 07/10/2018 IMPRESSION: Atherosclerotic change of the aorta with marked irregularity. Atherosclerotic change at both carotid bifurcations. No stenosis on the right. 10% stenosis of the distal bulb on the left. No acute intracranial large or medium vessel occlusion. 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  MR BRAIN WO CONTRAST 07/10/2018 IMPRESSION: 1. Small acute or early subacute infarct the left pons, in keeping with reported right-sided weakness. No acute hemorrhage. 2. Old posterior left MCA territory infarct encephalomalacia. 3. Generalized atrophy.  ECHOCARDIOGRAM 07/10/2018 1. The left ventricle has hyperdynamic systolic function, with an ejection fraction of >65%. The cavity size was normal. There is mild asymmetric left ventricular hypertrophy of the basal anteroseptal wall. Left ventricular diastolic Doppler parameters  are consistent with impaired relaxation. No evidence of left ventricular regional wall motion abnormalities. 2. The right ventricle has normal systolic function. The cavity was normal. There is no increase in right ventricular wall thickness. 3. There is moderate mitral annular calcification present. 4. The aortic valve is tricuspid. Mild thickening  of the aortic valve. Mild calcification of the aortic valve. 5. The aortic root is normal in size and structure. 6. No intracardiac thrombi or masses were visualized.  CT HEAD WO CONTRAST 07/14/2018 IMPRESSION: 1. No new acute intracranial abnormality identified. 2. Small lucency within left hemi pons corresponding to recent infarction on prior MRI is stable in distribution. 3. Stable chronic infarcts within left superior cerebellar hemisphere and left MCA distribution. 4. Stable chronic microvascular ischemic changes and volume loss of the brain.     ASSESSMENT: [name] is a 85 y.o. year old male here with left pontine infarct secondary to small vessel disease and uncontrolled risk factors on 07/09/2018. Vascular risk factors include HTN, HLD, DM, A. fib on Eliquis, intracranial arthrosclerosis, and prior history of stroke.     PLAN:  1. Left pontine infarct: Continue Eliquis (apixaban) daily  and atorvastatin 80 mg daily for secondary stroke prevention. Maintain strict control of hypertension with blood pressure goal below 130/90, diabetes with hemoglobin A1c goal below 6.5% and cholesterol with LDL cholesterol (bad cholesterol) goal below 70 mg/dL.  I also advised the patient to eat a healthy diet with plenty of whole grains, cereals, fruits and vegetables, exercise regularly with at least 30 minutes of continuous activity daily and maintain ideal body weight. 2. HTN: Advised to continue current treatment regimen.  Today's BP ***.  Advised to continue to monitor at home along with continued follow-up with PCP for management 3. HLD: Advised to continue current treatment regimen along with continued follow-up with PCP for future prescribing and monitoring of lipid panel 4. DMII: Advised to continue to monitor glucose levels at home along with continued follow-up with PCP for management and monitoring    Follow up in *** or call earlier if needed   Greater than 50% of time  during this 45 minute visit was spent on counseling, explanation of diagnosis of ***, reviewing risk  factor management of ***, planning of further management along with potential future management, and discussion with patient and family answering all questions.    George HughJessica Sheniah Supak, AGNP-BC  [facility] [address] [place] [id number]  Phone [phone] Fax [phone] Note: This document was prepared with digital dictation and possible smart phrase technology. Any transcriptional errors that result from this process are unintentional.

## 2018-09-05 ENCOUNTER — Ambulatory Visit (INDEPENDENT_AMBULATORY_CARE_PROVIDER_SITE_OTHER): Payer: Medicare Other | Admitting: Family Medicine

## 2018-09-05 ENCOUNTER — Other Ambulatory Visit: Payer: Self-pay

## 2018-09-05 ENCOUNTER — Ambulatory Visit: Payer: Medicare Other | Admitting: Family Medicine

## 2018-09-05 DIAGNOSIS — R0602 Shortness of breath: Secondary | ICD-10-CM

## 2018-09-05 DIAGNOSIS — G4709 Other insomnia: Secondary | ICD-10-CM

## 2018-09-05 DIAGNOSIS — Z8673 Personal history of transient ischemic attack (TIA), and cerebral infarction without residual deficits: Secondary | ICD-10-CM

## 2018-09-05 DIAGNOSIS — E1159 Type 2 diabetes mellitus with other circulatory complications: Secondary | ICD-10-CM

## 2018-09-05 DIAGNOSIS — I482 Chronic atrial fibrillation, unspecified: Secondary | ICD-10-CM

## 2018-09-05 DIAGNOSIS — Z8701 Personal history of pneumonia (recurrent): Secondary | ICD-10-CM

## 2018-09-05 DIAGNOSIS — E039 Hypothyroidism, unspecified: Secondary | ICD-10-CM

## 2018-09-05 DIAGNOSIS — I2510 Atherosclerotic heart disease of native coronary artery without angina pectoris: Secondary | ICD-10-CM | POA: Diagnosis not present

## 2018-09-05 DIAGNOSIS — R7989 Other specified abnormal findings of blood chemistry: Secondary | ICD-10-CM

## 2018-09-05 MED ORDER — TRAZODONE HCL 50 MG PO TABS: 25.0000 mg | ORAL_TABLET | Freq: Every evening | ORAL | 3 refills | Status: DC | PRN

## 2018-09-05 NOTE — Progress Notes (Signed)
Virtual Visit via Video Note  I connected with [name] on 09/05/18 at  3:50 PM EDT by a video enabled telemedicine application and verified that I am speaking with the correct person using two identifiers.  Location: Patient: Located at home during today's encounter  Provider: Located at primary care office     I discussed the limitations of evaluation and management by telemedicine and the availability of in person appointments. The patient expressed understanding and agreed to proceed.  History of Present Illness:  Prior Office Note  Recently s/p CVA 07/10/18. [name] has had 6 ER visits since 07/10/18. He also has had a total of 4 admission within the last 6 months. He is cared for at home by his son and daughter in law. Last impatient admission occurred 07/31/18 in which he was admitted for chest pain, UTI, with confusion. He was treated medically for UTI.  Chest pain deemed not to be result of ACS-cardiac enzymes negative x3 and vital signs remained stable. Chest pain resolved. Given the frequency of recent hospital visit and admission, SNF was recommended for OT/PT. Per daughter Lovena Le, patient was only admitted to SNF for 1 week due his positive progression back to his normal baseline once UTI was treated. He has been home for almost 1 week and is not receiving in home OT, PT, and RN [facility] services. He is not experiencing any urinary retention symptoms. He is ambulating better and is able to go outside in his yard walk around with family close by. He is eating at his normal baseline and insomnia symptoms have improved. Patient denies chest pain or shortness of breath. He is scheduled to follow-up with neurology on 08/30/18 for post CVA evaluation. No other concerns today.  Bharath presented to ER again on 08/27/18 with a complaint of shortness of breath. Afib and RBBB noted on ECG, BNP 606.5. CXR right lung base with focal atelectasis or small infiltrate. He was treated with IV Lasix. Discharged  with Doxycycline and Lasix x 3 days. His daughter in law has accompanied him today via video as patient has difficulty hearing. She reports patient has been ambulatory, although he continue not to sleep well throughout the night. He is oriented well throughout the day, but continues to become confused at night. No recent falls. He is prescribed oxygen at bedtime, 2 liter/nasal canula.  Previously prescribed hydroxyzine to assist with agitation at nighttime however daughter-in-law reports this medication has been ineffective.  Patient is still anxious and gets up multiple times during the nighttime.  He has not had any other issues with urine retention or dysuria.  Patient has a history of UTI.  He continues to have in home physical therapy and nurse visits through home health services.  Observation Non-distressed and non-labored breathing. Speech is clear. Oriented to self and place Assessment and Plan:   Follow Up Instructions: 1. History of CVA (cerebrovascular accident) -Currently receiving physical therapy.  Patient remains ambulatory and has not had any recent falls.  Cognitive deficits remain present during nighttime hours only.  He is followed by neurology.  2. Coronary artery disease involving native coronary artery of native heart without angina pectoris -Tolerating beta-blocker and currently on an ACE inhibitor -Home health monitors blood pressure home readings have been stable.  3. History of recent pneumonia -No recent cough or shortness of breath since completion of Lasix and antibiotic.  Will obtain a CBC in 4 weeks.  4. Elevated brain natriuretic peptide (BNP) level -BNP 600 during recent  ER visit we will repeat in 4 weeks.  If level remains elevated will obtain a sooner appointment with cardiology and restart Lasix.  5. Chronic atrial fibrillation -Continue Eliquis -Followed by cardiology   6. Other insomnia -Discontinue hydroxyzine at nighttime will trial trazodone 25 to  50 mg 1 hour prior to bedtime.  He will only take hydroxyzine during the daytime for agitation or anxiousness.      I discussed the assessment and treatment plan with the patient. The patient was provided an opportunity to ask questions and all were answered. The patient agreed with the plan and demonstrated an understanding of the instructions.   The patient was advised to call back or seek an in-person evaluation if the symptoms worsen or if the condition fails to improve as anticipated.  I provided 25 minutes of non-face-to-face time during this encounter.   Joaquin CourtsKimberly Biff Rutigliano, FNP-C

## 2018-09-12 ENCOUNTER — Ambulatory Visit: Payer: Medicare Other | Admitting: Family Medicine

## 2018-09-12 ENCOUNTER — Telehealth: Payer: Self-pay | Admitting: Family Medicine

## 2018-09-12 NOTE — Telephone Encounter (Signed)
Contact daughter Lovena Le and please schedule patient for lab visit 6/30. Labs are pending. Keep follow-up in August.

## 2018-09-15 ENCOUNTER — Emergency Department (HOSPITAL_COMMUNITY): Payer: Medicare Other

## 2018-09-15 ENCOUNTER — Encounter (HOSPITAL_COMMUNITY): Payer: Self-pay | Admitting: Emergency Medicine

## 2018-09-15 ENCOUNTER — Inpatient Hospital Stay (HOSPITAL_COMMUNITY)
Admission: EM | Admit: 2018-09-15 | Discharge: 2018-09-18 | DRG: 193 | Disposition: A | Discharge status: Home Health | Payer: Medicare Other | Attending: Internal Medicine | Admitting: Internal Medicine

## 2018-09-15 ENCOUNTER — Other Ambulatory Visit: Payer: Self-pay

## 2018-09-15 DIAGNOSIS — Z66 Do not resuscitate: Secondary | ICD-10-CM | POA: Diagnosis present

## 2018-09-15 DIAGNOSIS — R4702 Dysphasia: Secondary | ICD-10-CM | POA: Diagnosis present

## 2018-09-15 DIAGNOSIS — I5031 Acute diastolic (congestive) heart failure: Secondary | ICD-10-CM | POA: Diagnosis present

## 2018-09-15 DIAGNOSIS — Z8673 Personal history of transient ischemic attack (TIA), and cerebral infarction without residual deficits: Secondary | ICD-10-CM

## 2018-09-15 DIAGNOSIS — F0280 Dementia in other diseases classified elsewhere without behavioral disturbance: Secondary | ICD-10-CM | POA: Diagnosis present

## 2018-09-15 DIAGNOSIS — Z7951 Long term (current) use of inhaled steroids: Secondary | ICD-10-CM | POA: Diagnosis not present

## 2018-09-15 DIAGNOSIS — I5033 Acute on chronic diastolic (congestive) heart failure: Secondary | ICD-10-CM | POA: Diagnosis not present

## 2018-09-15 DIAGNOSIS — R0902 Hypoxemia: Secondary | ICD-10-CM | POA: Diagnosis present

## 2018-09-15 DIAGNOSIS — I110 Hypertensive heart disease with heart failure: Secondary | ICD-10-CM | POA: Diagnosis present

## 2018-09-15 DIAGNOSIS — Z8249 Family history of ischemic heart disease and other diseases of the circulatory system: Secondary | ICD-10-CM

## 2018-09-15 DIAGNOSIS — Z7984 Long term (current) use of oral hypoglycemic drugs: Secondary | ICD-10-CM | POA: Diagnosis not present

## 2018-09-15 DIAGNOSIS — Z7901 Long term (current) use of anticoagulants: Secondary | ICD-10-CM

## 2018-09-15 DIAGNOSIS — J441 Chronic obstructive pulmonary disease with (acute) exacerbation: Secondary | ICD-10-CM | POA: Diagnosis present

## 2018-09-15 DIAGNOSIS — Z20828 Contact with and (suspected) exposure to other viral communicable diseases: Secondary | ICD-10-CM | POA: Diagnosis present

## 2018-09-15 DIAGNOSIS — E785 Hyperlipidemia, unspecified: Secondary | ICD-10-CM | POA: Diagnosis present

## 2018-09-15 DIAGNOSIS — E039 Hypothyroidism, unspecified: Secondary | ICD-10-CM | POA: Diagnosis present

## 2018-09-15 DIAGNOSIS — J189 Pneumonia, unspecified organism: Secondary | ICD-10-CM | POA: Diagnosis present

## 2018-09-15 DIAGNOSIS — E1165 Type 2 diabetes mellitus with hyperglycemia: Secondary | ICD-10-CM | POA: Diagnosis present

## 2018-09-15 DIAGNOSIS — G9340 Encephalopathy, unspecified: Secondary | ICD-10-CM | POA: Diagnosis present

## 2018-09-15 DIAGNOSIS — H409 Unspecified glaucoma: Secondary | ICD-10-CM | POA: Diagnosis present

## 2018-09-15 DIAGNOSIS — I2510 Atherosclerotic heart disease of native coronary artery without angina pectoris: Secondary | ICD-10-CM | POA: Diagnosis present

## 2018-09-15 DIAGNOSIS — IMO0002 Reserved for concepts with insufficient information to code with codable children: Secondary | ICD-10-CM | POA: Diagnosis present

## 2018-09-15 DIAGNOSIS — Y95 Nosocomial condition: Secondary | ICD-10-CM | POA: Diagnosis present

## 2018-09-15 DIAGNOSIS — R410 Disorientation, unspecified: Secondary | ICD-10-CM | POA: Diagnosis not present

## 2018-09-15 DIAGNOSIS — R4182 Altered mental status, unspecified: Secondary | ICD-10-CM | POA: Diagnosis present

## 2018-09-15 DIAGNOSIS — I10 Essential (primary) hypertension: Secondary | ICD-10-CM | POA: Diagnosis present

## 2018-09-15 DIAGNOSIS — I4891 Unspecified atrial fibrillation: Secondary | ICD-10-CM | POA: Diagnosis present

## 2018-09-15 DIAGNOSIS — J440 Chronic obstructive pulmonary disease with acute lower respiratory infection: Secondary | ICD-10-CM | POA: Diagnosis present

## 2018-09-15 DIAGNOSIS — I482 Chronic atrial fibrillation, unspecified: Secondary | ICD-10-CM | POA: Diagnosis not present

## 2018-09-15 DIAGNOSIS — I509 Heart failure, unspecified: Secondary | ICD-10-CM

## 2018-09-15 LAB — CBC WITH DIFFERENTIAL/PLATELET
Abs Immature Granulocytes: 0.02 10*3/uL (ref 0.00–0.07)
Basophils Absolute: 0.1 10*3/uL (ref 0.0–0.1)
Basophils Relative: 1 %
Eosinophils Absolute: 0.5 10*3/uL (ref 0.0–0.5)
Eosinophils Relative: 9 %
HCT: 39.1 % (ref 39.0–52.0)
Hemoglobin: 12.1 g/dL — ABNORMAL LOW (ref 13.0–17.0)
Immature Granulocytes: 0 %
Lymphocytes Relative: 18 %
Lymphs Abs: 1 10*3/uL (ref 0.7–4.0)
MCH: 28.3 pg (ref 26.0–34.0)
MCHC: 30.9 g/dL (ref 30.0–36.0)
MCV: 91.4 fL (ref 80.0–100.0)
Monocytes Absolute: 0.5 10*3/uL (ref 0.1–1.0)
Monocytes Relative: 9 %
Neutro Abs: 3.3 10*3/uL (ref 1.7–7.7)
Neutrophils Relative %: 63 %
Platelets: 171 10*3/uL (ref 150–400)
RBC: 4.28 MIL/uL (ref 4.22–5.81)
RDW: 13.8 % (ref 11.5–15.5)
WBC: 5.3 10*3/uL (ref 4.0–10.5)
nRBC: 0 % (ref 0.0–0.2)

## 2018-09-15 LAB — POCT I-STAT EG7
Acid-base deficit: 1 mmol/L (ref 0.0–2.0)
Bicarbonate: 27.6 mmol/L (ref 20.0–28.0)
Calcium, Ion: 1.29 mmol/L (ref 1.15–1.40)
HCT: 38 % — ABNORMAL LOW (ref 39.0–52.0)
Hemoglobin: 12.9 g/dL — ABNORMAL LOW (ref 13.0–17.0)
O2 Saturation: 99 %
Potassium: 3.8 mmol/L (ref 3.5–5.1)
Sodium: 144 mmol/L (ref 135–145)
TCO2: 29 mmol/L (ref 22–32)
pCO2, Ven: 61.3 mmHg — ABNORMAL HIGH (ref 44.0–60.0)
pH, Ven: 7.262 (ref 7.250–7.430)
pO2, Ven: 165 mmHg — ABNORMAL HIGH (ref 32.0–45.0)

## 2018-09-15 LAB — LACTIC ACID, PLASMA
Lactic Acid, Venous: 1.6 mmol/L (ref 0.5–1.9)
Lactic Acid, Venous: 0.9 mmol/L (ref 0.5–1.9)

## 2018-09-15 LAB — URINALYSIS, ROUTINE W REFLEX MICROSCOPIC
Bilirubin Urine: NEGATIVE
Glucose, UA: NEGATIVE mg/dL
Hgb urine dipstick: NEGATIVE
Ketones, ur: NEGATIVE mg/dL
Leukocytes,Ua: NEGATIVE
Nitrite: NEGATIVE
Protein, ur: NEGATIVE mg/dL
Specific Gravity, Urine: 1.011 (ref 1.005–1.030)
pH: 7 (ref 5.0–8.0)

## 2018-09-15 LAB — COMPREHENSIVE METABOLIC PANEL
ALT: 16 U/L (ref 0–44)
AST: 19 U/L (ref 15–41)
Albumin: 3.5 g/dL (ref 3.5–5.0)
Alkaline Phosphatase: 54 U/L (ref 38–126)
Anion gap: 9 (ref 5–15)
BUN: 13 mg/dL (ref 8–23)
CO2: 26 mmol/L (ref 22–32)
Calcium: 9.3 mg/dL (ref 8.9–10.3)
Chloride: 108 mmol/L (ref 98–111)
Creatinine, Ser: 1.08 mg/dL (ref 0.61–1.24)
GFR calc Af Amer: >60 mL/min (ref >60)
GFR calc non Af Amer: >60 mL/min (ref >60)
Glucose, Bld: 145 mg/dL — ABNORMAL HIGH (ref 70–99)
Potassium: 3.8 mmol/L (ref 3.5–5.1)
Sodium: 143 mmol/L (ref 135–145)
Total Bilirubin: 0.8 mg/dL (ref 0.3–1.2)
Total Protein: 6.2 g/dL — ABNORMAL LOW (ref 6.5–8.1)

## 2018-09-15 LAB — CBG MONITORING, ED: Glucose-Capillary: 187 mg/dL — ABNORMAL HIGH (ref 70–99)

## 2018-09-15 LAB — SARS CORONAVIRUS 2 BY RT PCR (HOSPITAL ORDER, PERFORMED IN ~~LOC~~ HOSPITAL LAB): SARS Coronavirus 2: NEGATIVE

## 2018-09-15 LAB — BRAIN NATRIURETIC PEPTIDE: B Natriuretic Peptide: 724 pg/mL — ABNORMAL HIGH (ref 0.0–100.0)

## 2018-09-15 MED ORDER — VANCOMYCIN HCL IN DEXTROSE 1-5 GM/200ML-% IV SOLN
1000.0000 mg | INTRAVENOUS | Status: DC
  Administered 2018-09-16: 1000 mg via INTRAVENOUS
  Filled 2018-09-15: qty 200

## 2018-09-15 MED ORDER — INSULIN ASPART 100 UNIT/ML ~~LOC~~ SOLN
0.0000 [IU] | Freq: Every day | SUBCUTANEOUS | Status: DC
  Administered 2018-09-16: 2 [IU] via SUBCUTANEOUS

## 2018-09-15 MED ORDER — SODIUM CHLORIDE 0.9 % IV SOLN
2.0000 g | Freq: Two times a day (BID) | INTRAVENOUS | Status: DC
  Administered 2018-09-16 – 2018-09-17 (×3): 2 g via INTRAVENOUS
  Filled 2018-09-15 (×5): qty 2

## 2018-09-15 MED ORDER — FUROSEMIDE 10 MG/ML IJ SOLN
40.0000 mg | Freq: Once | INTRAMUSCULAR | Status: AC
  Administered 2018-09-15: 19:00:00 40 mg via INTRAVENOUS
  Filled 2018-09-15: qty 4

## 2018-09-15 MED ORDER — INSULIN ASPART 100 UNIT/ML ~~LOC~~ SOLN
0.0000 [IU] | Freq: Three times a day (TID) | SUBCUTANEOUS | Status: DC
  Administered 2018-09-16 – 2018-09-17 (×5): 2 [IU] via SUBCUTANEOUS
  Administered 2018-09-17: 1 [IU] via SUBCUTANEOUS

## 2018-09-15 MED ORDER — METHYLPREDNISOLONE SODIUM SUCC 125 MG IJ SOLR
125.0000 mg | Freq: Once | INTRAMUSCULAR | Status: AC
  Administered 2018-09-15: 17:00:00 125 mg via INTRAVENOUS
  Filled 2018-09-15: qty 2

## 2018-09-15 MED ORDER — SODIUM CHLORIDE 0.9 % IV SOLN
1.0000 g | Freq: Once | INTRAVENOUS | Status: AC
  Administered 2018-09-15: 16:00:00 1 g via INTRAVENOUS
  Filled 2018-09-15: qty 1

## 2018-09-15 MED ORDER — HYDROCODONE-ACETAMINOPHEN 5-325 MG PO TABS
1.0000 | ORAL_TABLET | Freq: Once | ORAL | Status: AC
  Administered 2018-09-15: 1 via ORAL
  Filled 2018-09-15: qty 1

## 2018-09-15 MED ORDER — PREDNISONE 20 MG PO TABS
40.0000 mg | ORAL_TABLET | Freq: Two times a day (BID) | ORAL | Status: DC
  Administered 2018-09-16 – 2018-09-17 (×3): 40 mg via ORAL
  Filled 2018-09-15 (×3): qty 2

## 2018-09-15 MED ORDER — VANCOMYCIN HCL 10 G IV SOLR
1500.0000 mg | Freq: Once | INTRAVENOUS | Status: AC
  Administered 2018-09-15: 17:00:00 1500 mg via INTRAVENOUS
  Filled 2018-09-15: qty 1500

## 2018-09-15 NOTE — ED Notes (Signed)
Patient transported to CT 

## 2018-09-15 NOTE — ED Notes (Signed)
ED TO INPATIENT HANDOFF REPORT  ED Nurse Name and Phone #: Susa RaringLisa, RN [phone]  S Name/Age/Gender [name] 85 y.o. male Room/Bed: 041C/041C  Code Status   Code Status: Prior  Home/SNF/Other Home Patient oriented to: self, place and time Is this baseline? Yes   Triage Complete: Triage complete  Chief Complaint ams and weakness  Triage Note Ems called out for stroke symptoms. History of stroke but family states symptoms are worse. C/o slurred speech, headache and generalized weakness. EMS reports O2 sats 87% on room air on scene. Placed pt on 2L per nasal cannula, sats came up to 94%   Allergies No Known Allergies  Level of Care/Admitting Diagnosis ED Disposition    ED Disposition Condition Comment   Admit  Hospital [facility] [[id number]]  Level of Care: Telemetry Medical [104]  Covid Evaluation: Confirmed COVID Negative  Diagnosis: HCAP (healthcare-associated pneumonia) [244010][[id number]]  Admitting Physician: Rometta EmeryGARBA, MOHAMMAD L [[id number]]  Attending Physician: Rometta EmeryGARBA, MOHAMMAD L [[id number]]  Estimated length of stay: past midnight tomorrow  Certification:: I certify this patient will need inpatient services for at least 2 midnights  PT Class (Do Not Modify): Inpatient [101]  PT Acc Code (Do Not Modify): Private [1]       B Medical/Surgery History Past Medical History:  Diagnosis Date  . Atrial fibrillation (HCC)   . COPD (chronic obstructive pulmonary disease) (HCC)   . CVA (cerebral infarction)   . Diabetes mellitus without complication (HCC)   . Essential hypertension, benign   . Hard of hearing    Past Surgical History:  Procedure Laterality Date  . HERNIA REPAIR    . LEFT HEART CATHETERIZATION WITH CORONARY ANGIOGRAM N/A 03/28/2013   Procedure: LEFT HEART CATHETERIZATION WITH CORONARY ANGIOGRAM;  Surgeon: Peter M SwazilandJordan, MD;  [facility];  Service: Cardiovascular;  Laterality: N/A;     A IV Location/Drains/Wounds Patient  Lines/Drains/Airways Status   Active Line/Drains/Airways    Name:   Placement date:   Placement time:   Site:   Days:   Peripheral IV 09/15/18 Left Antecubital   09/15/18    1600    Antecubital   less than 1   Peripheral IV 09/15/18 Left Forearm   09/15/18    -    Forearm   less than 1   External Urinary Catheter   07/31/18    1627    -   46   External Urinary Catheter   09/15/18    1851    -   less than 1          Intake/Output Last 24 hours  Intake/Output Summary (Last 24 hours) at 09/15/2018 1855 Last data filed at 09/15/2018 1712 Gross per 24 hour  Intake 100 ml  Output -  Net 100 ml    Labs/Imaging Results for orders placed or performed during the hospital encounter of 09/15/18 (from the past 48 hour(s))  Lactic acid, plasma     Status: None   Collection Time: 09/15/18  2:39 PM  Result Value Ref Range   Lactic Acid, Venous 1.6 0.5 - 1.9 mmol/L    Comment: Performed at [facility], [address] [place], [place] [address]  Comprehensive metabolic panel     Status: Abnormal   Collection Time: 09/15/18  2:39 PM  Result Value Ref Range   Sodium 143 135 - 145 mmol/L   Potassium 3.8 3.5 - 5.1 mmol/L   Chloride 108 98 - 111 mmol/L  CO2 26 22 - 32 mmol/L   Glucose, Bld 145 (H) 70 - 99 mg/dL   BUN 13 8 - 23 mg/dL   Creatinine, Ser 4.131.08 0.61 - 1.24 mg/dL   Calcium 9.3 8.9 - 24.410.3 mg/dL   Total Protein 6.2 (L) 6.5 - 8.1 g/dL   Albumin 3.5 3.5 - 5.0 g/dL   AST 19 15 - 41 U/L   ALT 16 0 - 44 U/L   Alkaline Phosphatase 54 38 - 126 U/L   Total Bilirubin 0.8 0.3 - 1.2 mg/dL   GFR calc non Af Amer >60 >60 mL/min   GFR calc Af Amer >60 >60 mL/min   Anion gap 9 5 - 15    Comment: Performed at [facility], [address] [place], [place] [address]  CBC WITH DIFFERENTIAL     Status: Abnormal   Collection Time: 09/15/18  2:39 PM  Result Value Ref Range   WBC 5.3 4.0 - 10.5 K/uL   RBC 4.28 4.22 - 5.81 MIL/uL   Hemoglobin 12.1 (L) 13.0 - 17.0 g/dL   HCT 25.339.1 66.439.0 -  40.352.0 %   MCV 91.4 80.0 - 100.0 fL   MCH 28.3 26.0 - 34.0 pg   MCHC 30.9 30.0 - 36.0 g/dL   RDW 47.413.8 25.911.5 - 56.315.5 %   Platelets 171 150 - 400 K/uL   nRBC 0.0 0.0 - 0.2 %   Neutrophils Relative % 63 %   Neutro Abs 3.3 1.7 - 7.7 K/uL   Lymphocytes Relative 18 %   Lymphs Abs 1.0 0.7 - 4.0 K/uL   Monocytes Relative 9 %   Monocytes Absolute 0.5 0.1 - 1.0 K/uL   Eosinophils Relative 9 %   Eosinophils Absolute 0.5 0.0 - 0.5 K/uL   Basophils Relative 1 %   Basophils Absolute 0.1 0.0 - 0.1 K/uL   Immature Granulocytes 0 %   Abs Immature Granulocytes 0.02 0.00 - 0.07 K/uL    Comment: Performed at [facility], [address] [place], [place] [address]  Brain natriuretic peptide     Status: Abnormal   Collection Time: 09/15/18  2:39 PM  Result Value Ref Range   B Natriuretic Peptide 724.0 (H) 0.0 - 100.0 pg/mL    Comment: Performed at [facility], [address] [place], [place] [address]  SARS Coronavirus 2 (CEPHEID- Performed in [facility] hospital lab), Hosp Order     Status: None   Collection Time: 09/15/18  2:51 PM   Specimen: Nasopharyngeal Swab  Result Value Ref Range   SARS Coronavirus 2 NEGATIVE NEGATIVE    Comment: (NOTE) If result is NEGATIVE SARS-CoV-2 target nucleic acids are NOT DETECTED. The SARS-CoV-2 RNA is generally detectable in upper and lower  respiratory specimens during the acute phase of infection. The lowest  concentration of SARS-CoV-2 viral copies this assay can detect is 250  copies / mL. A negative result does not preclude SARS-CoV-2 infection  and should not be used as the sole basis for treatment or other  patient management decisions.  A negative result may occur with  improper specimen collection / handling, submission of specimen other  than nasopharyngeal swab, presence of viral mutation(s) within the  areas targeted by this assay, and inadequate number of viral copies  (<250 copies / mL). A negative result must be combined with clinical   observations, patient history, and epidemiological information. If result is POSITIVE SARS-CoV-2 target nucleic acids are DETECTED. The SARS-CoV-2 RNA is generally  detectable in upper and lower  respiratory specimens dur ing the acute phase of infection.  Positive  results are indicative of active infection with SARS-CoV-2.  Clinical  correlation with patient history and other diagnostic information is  necessary to determine patient infection status.  Positive results do  not rule out bacterial infection or co-infection with other viruses. If result is PRESUMPTIVE POSTIVE SARS-CoV-2 nucleic acids MAY BE PRESENT.   A presumptive positive result was obtained on the submitted specimen  and confirmed on repeat testing.  While 2019 novel coronavirus  (SARS-CoV-2) nucleic acids may be present in the submitted sample  additional confirmatory testing may be necessary for epidemiological  and / or clinical management purposes  to differentiate between  SARS-CoV-2 and other Sarbecovirus currently known to infect humans.  If clinically indicated additional testing with an alternate test  methodology [phone]) is advised. The SARS-CoV-2 RNA is generally  detectable in upper and lower respiratory sp ecimens during the acute  phase of infection. The expected result is Negative. Fact Sheet for Patients:  StrictlyIdeas.no Fact Sheet for Healthcare Providers: BankingDealers.co.za This test is not yet approved or cleared by the [place] FDA and has been authorized for detection and/or diagnosis of SARS-CoV-2 by FDA under an Emergency Use Authorization (EUA).  This EUA will remain in effect (meaning this test can be used) for the duration of the COVID-19 declaration under Section 564(b)(1) of the Act, 21 U.S.C. section 360bbb-3(b)(1), unless the authorization is terminated or revoked sooner. Performed at [facility], [place] [address],  [place], [place] [address]   POCT I-Stat EG7     Status: Abnormal   Collection Time: 09/15/18  3:34 PM  Result Value Ref Range   pH, Ven 7.262 7.250 - 7.430   pCO2, Ven 61.3 (H) 44.0 - 60.0 mmHg   pO2, Ven 165.0 (H) 32.0 - 45.0 mmHg   Bicarbonate 27.6 20.0 - 28.0 mmol/L   TCO2 29 22 - 32 mmol/L   O2 Saturation 99.0 %   Acid-base deficit 1.0 0.0 - 2.0 mmol/L   Sodium 144 135 - 145 mmol/L   Potassium 3.8 3.5 - 5.1 mmol/L   Calcium, Ion 1.29 1.15 - 1.40 mmol/L   HCT 38.0 (L) 39.0 - 52.0 %   Hemoglobin 12.9 (L) 13.0 - 17.0 g/dL   Patient temperature HIDE    Sample type VENOUS   Lactic acid, plasma     Status: None   Collection Time: 09/15/18  4:11 PM  Result Value Ref Range   Lactic Acid, Venous 0.9 0.5 - 1.9 mmol/L    Comment: Performed at [facility], [place] [address] [place], [place] [address]  Urinalysis, Routine w reflex microscopic     Status: None   Collection Time: 09/15/18  4:14 PM  Result Value Ref Range   Color, Urine YELLOW YELLOW   APPearance CLEAR CLEAR   Specific Gravity, Urine 1.011 1.005 - 1.030   pH 7.0 5.0 - 8.0   Glucose, UA NEGATIVE NEGATIVE mg/dL   Hgb urine dipstick NEGATIVE NEGATIVE   Bilirubin Urine NEGATIVE NEGATIVE   Ketones, ur NEGATIVE NEGATIVE mg/dL   Protein, ur NEGATIVE NEGATIVE mg/dL   Nitrite NEGATIVE NEGATIVE   Leukocytes,Ua NEGATIVE NEGATIVE    Comment: Performed at [place] [address], [place], [place] [address]   Ct Head Wo Contrast  Result Date: 09/15/2018 CLINICAL DATA:  Slurred speech, headache and generalized weakness. History of stroke. EXAM: CT HEAD WITHOUT CONTRAST TECHNIQUE:  Contiguous axial images were obtained from the base of the skull through the vertex without intravenous contrast. COMPARISON:  07/14/2018 FINDINGS: Brain: No evidence of acute infarction, hemorrhage, or hydrocephalus. Stable large area of encephalomalacia from previous left MCA territory infarct. Lacunar infarct in the left pons is also stable.  Brain parenchymal volume loss and microangiopathy. Vascular: Calcific atherosclerotic disease of the intra cavernous carotid arteries. Skull: Normal. Negative for fracture or focal lesion. Sinuses/Orbits: Polypoid mucosal thickening of the ethmoid sinuses. The remainder of the paranasal sinuses and mastoid air cells are well aerated. Other: None. IMPRESSION: 1. No acute intracranial abnormality. 2. Stable large area of encephalomalacia from previous left MCA territory infarct. 3. Stable lacunar infarct in the left pons. 4. Atrophy, chronic microvascular disease. Electronically Signed   By: Ted Mcalpineobrinka  Dimitrova M.D.   On: 09/15/2018 16:03   Dg Chest Port 1 View  Result Date: 09/15/2018 CLINICAL DATA:  Shortness of breath EXAM: PORTABLE CHEST 1 VIEW COMPARISON:  08/27/2018, 07/31/2018 07/19/2018, 02/01/2018, CT 02/02/2018 FINDINGS: Emphysematous disease. Diffuse bilateral interstitial opacity some of which is felt secondary to chronic change. Overall interstitial opacities appear increased as compared with prior, suspect for acute superimposed interstitial edema or inflammatory process. Stable cardiomediastinal silhouette. No pneumothorax. Possible tiny right effusion. IMPRESSION: 1. Diffusely increased bilateral interstitial opacity, suspicious for acute interstitial edema or inflammatory process superimposed on underlying chronic interstitial disease. Possible tiny right effusion 2. Emphysematous disease Electronically Signed   By: Jasmine PangKim  Fujinaga M.D.   On: 09/15/2018 15:05    Pending Labs Unresulted Labs (From admission, onward)    Start     Ordered   09/15/18 1440  Urine culture  ONCE - STAT,   STAT     09/15/18 1440   09/15/18 1439  Blood Culture (routine x 2)  BLOOD CULTURE X 2,   STAT     09/15/18 1440          Vitals/Pain Today's Vitals   09/15/18 1600 09/15/18 1630 09/15/18 1700 09/15/18 1830  BP: (!) 175/67 (!) 149/61 (!) 149/106   Pulse: 93 (!) 58 61   Resp: 15 (!) 22 (!) 22   Temp:       TempSrc:      SpO2: 99% 99% 97%   Weight:      Height:      PainSc:    0-No pain    Isolation Precautions Droplet and Contact precautions  Medications Medications  vancomycin (VANCOCIN) 1,500 mg in sodium chloride 0.9 % 500 mL IVPB (1,500 mg Intravenous New Bag/Given 09/15/18 1707)  vancomycin (VANCOCIN) IVPB 1000 mg/200 mL premix (has no administration in time range)  ceFEPIme (MAXIPIME) 1 g in sodium chloride 0.9 % 100 mL IVPB (0 g Intravenous Stopped 09/15/18 1712)  methylPREDNISolone sodium succinate (SOLU-MEDROL) 125 mg/2 mL injection 125 mg (125 mg Intravenous Given 09/15/18 1709)  furosemide (LASIX) injection 40 mg (40 mg Intravenous Given 09/15/18 1836)    Mobility walks with person assist Low fall risk   Focused Assessments Neuro Assessment Handoff:  Swallow screen pass? Yes  Cardiac Rhythm: Normal sinus rhythm       Neuro Assessment:   Neuro Checks:      Last Documented NIHSS Modified Score:   Has TPA been given? No If patient is a Neuro Trauma and patient is going to OR before floor call report to 4N Charge nurse: [phone] or [phone]     R Recommendations: See Admitting Provider Note  Report given to:   Additional Notes:

## 2018-09-15 NOTE — Progress Notes (Addendum)
Pharmacy Antibiotic Note  [name] is a 85 y.o. male admitted on 09/15/2018 with pneumonia.  Pharmacy has been consulted for vancomycin dosing.  Presents with AMS, possible CVA. Recently treated for PNA- completed a few days ago (records show on doxycycline). WBC 5.3, LA 1.6, afebrile. CXR showing increased bilateral opacity. Scr 1.08 (CrCl 45).  Plan: Cefepime 2 g IV every 12 hours Vancomycin 1500 mg IV once then 1000 mg IV every 24 hours Monitor renal fx, cx results, clinical pic, and vanc level as appropriate  Height: 5\' 8"  (172.7 cm) Weight: 160 lb 0.9 oz (72.6 kg) IBW/kg (Calculated) : 68.4  Temp (24hrs), Avg:98 F (36.7 C), Min:98 F (36.7 C), Max:98 F (36.7 C)  Recent Labs  Lab 09/15/18 1439  WBC 5.3  LATICACIDVEN 1.6    Estimated Creatinine Clearance: 48 mL/min (by C-G formula based on SCr of 1.03 mg/dL).    No Known Allergies  Antimicrobials this admission: Vanc 6/21 >>  Cefepime 6/21 >>   Dose adjustments this admission: N/A  Microbiology results: 6/21 BCx: sent 6/21 UCx: sent  6/21 COVID: sent  Thank you for allowing pharmacy to be a part of this patient's care.  Antonietta Jewel, PharmD, [place] Clinical Pharmacist  Pager: [phone] Phone: [phone] 09/15/2018 3:50 PM

## 2018-09-15 NOTE — H&P (Signed)
History and Physical   [name]:[id number] DOB: January 29, 1928 DOA: 09/15/2018  Referring MD/NP/PA: Dr. Madilyn Hook  PCP: Bing Neighbors, FNP   Outpatient Specialists: None  Patient coming from: Home  Chief Complaint: Altered mental status  HPI: [name] is a 85 y.o. male with medical history significant of Atrial fibrillation, COPD, previous CVA, diabetes, hypertension, difficulty hearing who was brought in from home secondary to altered mental status and confusion.  Patient apparently had some slurred speech about 2 hours prior to arrival.  He is currently at baseline.  Patient has been living with his brother apparently for the last 2 days and he was picked up today.  No fever or chills no reported direct contact with somebody who has COVID.  Patient was treated for pneumonia recently and believes he has recovered prior to today.  He has known coronary artery disease but no documented CHF.  Patient was evaluated and chest x-ray showed pulmonary infiltrates consistent with either fluid overload or pneumonia.  He is feeling better now with antibiotics and diuresis.  With his recent treatment for pneumonia he is being admitted with healthcare associated pneumonia and possible diastolic CHF.Marland Kitchen  ED Course: Temperature is 98 blood pressure 175/67 pulse 93 respiratory rate of 22 oxygen sat 89% room air.  ABG showed a pH of 7.262 which is venous.  Urinalysis negative.Sodium is 143 potassium 3.8 glucose 145.  BNP 724.  Lactic acid 1.6.  CBC entirely within normal.  Chest x-ray showed diffusely increased bilateral interstitial opacities suspicious for acute interstitial edema or inflammatory process.  Also underlying chronic interstitial disease.  Patient being admitted for treatment of pneumonia with CHF.  Review of Systems: As per HPI otherwise 10 point review of systems negative.    Past Medical History:  Diagnosis Date  . Atrial fibrillation (HCC)   . COPD (chronic obstructive  pulmonary disease) (HCC)   . CVA (cerebral infarction)   . Diabetes mellitus without complication (HCC)   . Essential hypertension, benign   . Hard of hearing     Past Surgical History:  Procedure Laterality Date  . HERNIA REPAIR    . LEFT HEART CATHETERIZATION WITH CORONARY ANGIOGRAM N/A 03/28/2013   Procedure: LEFT HEART CATHETERIZATION WITH CORONARY ANGIOGRAM;  Surgeon: Peter M Swaziland, MD;  Location: [facility] CATH LAB;  Service: Cardiovascular;  Laterality: N/A;     reports that he has quit smoking. His smoking use included cigarettes. He has quit using smokeless tobacco. He reports that he does not drink alcohol or use drugs.  No Known Allergies  Family History  Problem Relation Age of Onset  . CAD Son   . Skin cancer Brother      Prior to Admission medications   Medication Sig Start Date End Date Taking? Authorizing Provider  amLODipine (NORVASC) 5 MG tablet Take 1 tablet (5 mg total) by mouth daily. 08/08/18  Yes Lassen, Arlo C, PA-C  apixaban (ELIQUIS) 5 MG TABS tablet Take 1 tablet (5 mg total) by mouth 2 (two) times daily. 08/08/18  Yes Lassen, Arlo C, PA-C  atorvastatin (LIPITOR) 80 MG tablet Take 1 tablet (80 mg total) by mouth daily at 6 PM. 08/08/18  Yes Lassen, Arlo C, PA-C  brimonidine (ALPHAGAN) 0.15 % ophthalmic solution Place 1 drop into both eyes 2 (two) times daily. 08/08/18  Yes Lassen, Arlo C, PA-C  budesonide-formoterol (SYMBICORT) 80-4.5 MCG/ACT inhaler Inhale 2 puffs into the lungs 2 (two) times daily. 08/08/18  Yes Trudie Reed, Arlo C, PA-C  Carboxymethylcellulose  Sodium 0.25 % SOLN Place 2 drops into both eyes 4 (four) times daily as needed (for dry eyes). 08/08/18  Yes Lassen, Arlo C, PA-C  finasteride (PROSCAR) 5 MG tablet Take 1 tablet (5 mg total) by mouth daily. 08/08/18  Yes Lassen, Arlo C, PA-C  hydrochlorothiazide (HYDRODIURIL) 25 MG tablet Take 0.5 tablets (12.5 mg total) by mouth daily. 08/08/18  Yes Lassen, Arlo C, PA-C  Ipratropium-Albuterol (COMBIVENT RESPIMAT)  20-100 MCG/ACT AERS respimat Inhale 1 puff into the lungs every 6 (six) hours as needed for wheezing. 08/08/18  Yes Lassen, Arlo C, PA-C  levalbuterol (XOPENEX) 0.63 MG/3ML nebulizer solution Take 3 mLs (0.63 mg total) by nebulization every 6 (six) hours as needed for wheezing or shortness of breath. 08/08/18  Yes Oscar La, Arlo C, PA-C  levothyroxine (SYNTHROID) 25 MCG tablet Take 2 tablets (50 mcg total) by mouth daily before breakfast. 08/08/18  Yes Oscar La, Arlo C, PA-C  lisinopril (ZESTRIL) 40 MG tablet Take 0.5 tablets (20 mg total) by mouth daily. 08/08/18  Yes Oscar La, Arlo C, PA-C  metFORMIN (GLUCOPHAGE) 500 MG tablet Take 1 tablet (500 mg total) by mouth 2 (two) times daily with a meal. 08/08/18  Yes Lassen, Arlo C, PA-C  metoprolol tartrate (LOPRESSOR) 25 MG tablet Take 1 tablet (25 mg total) by mouth 2 (two) times daily. 08/08/18  Yes Lassen, Arlo C, PA-C  Multiple Vitamins-Minerals (MULTIVITAMIN WITH MINERALS) tablet Take 1 tablet by mouth daily. 08/08/18  Yes Lassen, Arlo C, PA-C  nitroGLYCERIN (NITROSTAT) 0.4 MG SL tablet Place 1 tablet (0.4 mg total) under the tongue every 5 (five) minutes as needed for chest pain. Dissolve one tablet under the tongue every 5 minutes as needed for chest pain. Repeat every 5 minutes if needed for a total of 3 tablets in 15 minutes. If no relief, call 911. Patient taking differently: Place 0.4 mg under the tongue every 5 (five) minutes x 3 doses as needed for chest pain (CALL 9-1-1, IF NO RELIEF).  08/08/18  Yes Lassen, Arlo C, PA-C  olopatadine (PATANOL) 0.1 % ophthalmic solution Place 1 drop into both eyes 2 (two) times daily. 08/08/18  Yes Lassen, Arlo C, PA-C  omeprazole (PRILOSEC) 20 MG capsule Take 1 capsule (20 mg total) by mouth daily. . 08/08/18  Yes Oscar La, Arlo C, PA-C  polyvinyl alcohol (LIQUIFILM TEARS) 1.4 % ophthalmic solution Place 1 drop into both eyes 4 (four) times daily. 08/08/18  Yes Lassen, Arlo C, PA-C  senna (SENOKOT) 8.6 MG TABS tablet Take 2  tablets (17.2 mg total) by mouth at bedtime. 08/08/18  Yes Granville Lewis C, PA-C  Skin Protectants, Misc. (MINERIN) CREA Apply 1 application topically See admin instructions. APPLY TO TRUNK AND LEGS TWICE A DAY    Yes [provider]  tamsulosin (FLOMAX) 0.4 MG CAPS capsule Take 1 capsule (0.4 mg total) by mouth daily. 08/08/18  Yes Lassen, Arlo C, PA-C  traZODone (DESYREL) 50 MG tablet Take 0.5-1 tablets (25-50 mg total) by mouth at bedtime as needed for sleep. 09/05/18  Yes Scot Jun, FNP  furosemide (LASIX) 20 MG tablet Take 1 tablet (20 mg total) by mouth daily for 3 days. Patient not taking: Reported on 09/15/2018 08/27/18 09/15/18  Cherre Robins, PA-C    Physical Exam: Vitals:   09/15/18 1730 09/15/18 1800 09/15/18 1830 09/15/18 1900  BP: (!) 150/135 (!) 157/65 (!) 119/100 (!) 165/77  Pulse: 77 66 82 80  Resp: (!) 22 (!) 22 (!) 22 (!) 22  Temp:  TempSrc:      SpO2: 93% 94% (!) 89% 93%  Weight:      Height:          Constitutional: Frail, hard of hearing, chronically ill looking Vitals:   09/15/18 1730 09/15/18 1800 09/15/18 1830 09/15/18 1900  BP: (!) 150/135 (!) 157/65 (!) 119/100 (!) 165/77  Pulse: 77 66 82 80  Resp: (!) 22 (!) 22 (!) 22 (!) 22  Temp:      TempSrc:      SpO2: 93% 94% (!) 89% 93%  Weight:      Height:       Eyes: PERRL, lids and conjunctivae normal ENMT: Mucous membranes are moist. Posterior pharynx clear of any exudate or lesions.Normal dentition.  Neck: normal, supple, no masses, no thyromegaly Respiratory:  decreased air entry bilaterally with marked expiratory wheezing and crackles normal respiratory effort. No accessory muscle use.  Cardiovascular: Irregularly irregular rhythm no murmurs / rubs / gallops. No extremity edema. 2+ pedal pulses. No carotid bruits.  Abdomen: no tenderness, no masses palpated. No hepatosplenomegaly. Bowel sounds positive.  Musculoskeletal: no clubbing / cyanosis. No joint deformity upper and lower  extremities. Good ROM, no contractures. Normal muscle tone.  Skin: no rashes, lesions, ulcers. No induration Neurologic: CN 2-12 grossly intact. Sensation intact, DTR normal. Strength 5/5 in all 4.  Psychiatric: Difficulty hearing so slow to respond otherwise alert and oriented x 3. Normal mood.     Labs on Admission: I have personally reviewed following labs and imaging studies  CBC: Recent Labs  Lab 09/15/18 1439 09/15/18 1534  WBC 5.3  --   NEUTROABS 3.3  --   HGB 12.1* 12.9*  HCT 39.1 38.0*  MCV 91.4  --   PLT 171  --    Basic Metabolic Panel: Recent Labs  Lab 09/15/18 1439 09/15/18 1534  NA 143 144  K 3.8 3.8  CL 108  --   CO2 26  --   GLUCOSE 145*  --   BUN 13  --   CREATININE 1.08  --   CALCIUM 9.3  --    GFR: Estimated Creatinine Clearance: 45.7 mL/min (by C-G formula based on SCr of 1.08 mg/dL). Liver Function Tests: Recent Labs  Lab 09/15/18 1439  AST 19  ALT 16  ALKPHOS 54  BILITOT 0.8  PROT 6.2*  ALBUMIN 3.5   No results for input(s): LIPASE, AMYLASE in the last 168 hours. No results for input(s): AMMONIA in the last 168 hours. Coagulation Profile: No results for input(s): INR, PROTIME in the last 168 hours. Cardiac Enzymes: No results for input(s): CKTOTAL, CKMB, CKMBINDEX, TROPONINI in the last 168 hours. BNP (last 3 results) No results for input(s): PROBNP in the last 8760 hours. HbA1C: No results for input(s): HGBA1C in the last 72 hours. CBG: No results for input(s): GLUCAP in the last 168 hours. Lipid Profile: No results for input(s): CHOL, HDL, LDLCALC, TRIG, CHOLHDL, LDLDIRECT in the last 72 hours. Thyroid Function Tests: No results for input(s): TSH, T4TOTAL, FREET4, T3FREE, THYROIDAB in the last 72 hours. Anemia Panel: No results for input(s): VITAMINB12, FOLATE, FERRITIN, TIBC, IRON, RETICCTPCT in the last 72 hours. Urine analysis:    Component Value Date/Time   COLORURINE YELLOW 09/15/2018 1614   APPEARANCEUR CLEAR  09/15/2018 1614   LABSPEC 1.011 09/15/2018 1614   PHURINE 7.0 09/15/2018 1614   GLUCOSEU NEGATIVE 09/15/2018 1614   HGBUR NEGATIVE 09/15/2018 1614   BILIRUBINUR NEGATIVE 09/15/2018 1614   KETONESUR NEGATIVE 09/15/2018 1614  PROTEINUR NEGATIVE 09/15/2018 1614   UROBILINOGEN 1.0 10/28/2014 1628   NITRITE NEGATIVE 09/15/2018 1614   LEUKOCYTESUR NEGATIVE 09/15/2018 1614   Sepsis Labs: @LABRCNTIP (procalcitonin:4,lacticidven:4) ) Recent Results (from the past 240 hour(s))  SARS Coronavirus 2 (CEPHEID- Performed [facility]), Hosp Order     Status: None   Collection Time: 09/15/18  2:51 PM   Specimen: Nasopharyngeal Swab  Result Value Ref Range Status   SARS Coronavirus 2 NEGATIVE NEGATIVE Final    Comment: (NOTE) If result is NEGATIVE SARS-CoV-2 target nucleic acids are NOT DETECTED. The SARS-CoV-2 RNA is generally detectable in upper and lower  respiratory specimens during the acute phase of infection. The lowest  concentration of SARS-CoV-2 viral copies this assay can detect is 250  copies / mL. A negative result does not preclude SARS-CoV-2 infection  and should not be used as the sole basis for treatment or other  patient management decisions.  A negative result may occur with  improper specimen collection / handling, submission of specimen other  than nasopharyngeal swab, presence of viral mutation(s) within the  areas targeted by this assay, and inadequate number of viral copies  (<250 copies / mL). A negative result must be combined with clinical  observations, patient history, and epidemiological information. If result is POSITIVE SARS-CoV-2 target nucleic acids are DETECTED. The SARS-CoV-2 RNA is generally detectable in upper and lower  respiratory specimens dur ing the acute phase of infection.  Positive  results are indicative of active infection with SARS-CoV-2.  Clinical  correlation with patient history and other diagnostic information is  necessary  to determine patient infection status.  Positive results do  not rule out bacterial infection or co-infection with other viruses. If result is PRESUMPTIVE POSTIVE SARS-CoV-2 nucleic acids MAY BE PRESENT.   A presumptive positive result was obtained on the submitted specimen  and confirmed on repeat testing.  While 2019 novel coronavirus  (SARS-CoV-2) nucleic acids may be present in the submitted sample  additional confirmatory testing may be necessary for epidemiological  and / or clinical management purposes  to differentiate between  SARS-CoV-2 and other Sarbecovirus currently known to infect humans.  If clinically indicated additional testing with an alternate test  methodology [phone] is advised. The SARS-CoV-2 RNA is generally  detectable in upper and lower respiratory sp ecimens during the acute  phase of infection. The expected result is Negative. Fact Sheet for Patients:  BoilerBrush.com.cyhttps://www.fda.gov/media/136312/download Fact Sheet for Healthcare Providers: https://pope.com/https://www.fda.gov/media/136313/download This test is not yet approved or cleared by the [place] FDA and has been authorized for detection and/or diagnosis of SARS-CoV-2 by FDA under an Emergency Use Authorization (EUA).  This EUA will remain in effect (meaning this test can be used) for the duration of the COVID-19 declaration under Section 564(b)(1) of the Act, 21 U.S.C. section 360bbb-3(b)(1), unless the authorization is terminated or revoked sooner. Performed at [facility], [address] [place], [place] [address]      Radiological Exams on Admission: Ct Head Wo Contrast  Result Date: 09/15/2018 CLINICAL DATA:  Slurred speech, headache and generalized weakness. History of stroke. EXAM: CT HEAD WITHOUT CONTRAST TECHNIQUE: Contiguous axial images were obtained from the base of the skull through the vertex without intravenous contrast. COMPARISON:  07/14/2018 FINDINGS: Brain: No evidence of acute infarction,  hemorrhage, or hydrocephalus. Stable large area of encephalomalacia from previous left MCA territory infarct. Lacunar infarct in the left pons is also stable. Brain parenchymal volume loss and microangiopathy. Vascular: Calcific atherosclerotic  disease of the intra cavernous carotid arteries. Skull: Normal. Negative for fracture or focal lesion. Sinuses/Orbits: Polypoid mucosal thickening of the ethmoid sinuses. The remainder of the paranasal sinuses and mastoid air cells are well aerated. Other: None. IMPRESSION: 1. No acute intracranial abnormality. 2. Stable large area of encephalomalacia from previous left MCA territory infarct. 3. Stable lacunar infarct in the left pons. 4. Atrophy, chronic microvascular disease. Electronically Signed   By: Ted Mcalpineobrinka  Dimitrova M.D.   On: 09/15/2018 16:03   Dg Chest Port 1 View  Result Date: 09/15/2018 CLINICAL DATA:  Shortness of breath EXAM: PORTABLE CHEST 1 VIEW COMPARISON:  08/27/2018, 07/31/2018 07/19/2018, 02/01/2018, CT 02/02/2018 FINDINGS: Emphysematous disease. Diffuse bilateral interstitial opacity some of which is felt secondary to chronic change. Overall interstitial opacities appear increased as compared with prior, suspect for acute superimposed interstitial edema or inflammatory process. Stable cardiomediastinal silhouette. No pneumothorax. Possible tiny right effusion. IMPRESSION: 1. Diffusely increased bilateral interstitial opacity, suspicious for acute interstitial edema or inflammatory process superimposed on underlying chronic interstitial disease. Possible tiny right effusion 2. Emphysematous disease Electronically Signed   By: Jasmine PangKim  Fujinaga M.D.   On: 09/15/2018 15:05      Assessment/Plan Principal Problem:   HCAP (healthcare-associated pneumonia) Active Problems:   COPD exacerbation (HCC)   Diabetes mellitus type II, uncontrolled (HCC)   Hypothyroidism   HTN (hypertension)   Atrial fibrillation (HCC)   CAD (coronary artery disease)    Altered mental status   Acute CHF (congestive heart failure) (HCC)     #1 Healthcare associated pneumonia versus CHF: Patient will be admitted.  He is feeling better with diuresis in the ER.  He was supposed to be on Lasix orally but not taking it.  I will initiate IV Lasix.  Also treat with vancomycin and cefepime while cultures are pending.  This is more likely to be CHF than actual pneumonia.  Repeat chest x-ray within 24 to 48 hours and see if it clears up.  Patient had recent echocardiogram 2 months ago showing normal EF.  #2 COPD: Mild exacerbation most likely contributing.  Continue inhalers.  Low-dose steroids may be added.  #3 diabetes: Sliding scale insulin will be added  #4 atrial fibrillation: At this point is controlled.  Get EKG.  #5 hypertension: Continue with home regimen.  #6 altered mental status: Mental status is back to baseline at the moment.  #7 coronary artery disease: Monitor on telemetry.  No evidence of decompensation.  #8 hypothyroidism: Check TSH and continue with levothyroxine.     DVT prophylaxis: Lovenox Code Status: DNR Family Communication: Son over the phone Disposition Plan: To be determined Consults called: None Admission status: Inpatient  Severity of Illness: The appropriate patient status for this patient is INPATIENT. Inpatient status is judged to be reasonable and necessary in order to provide the required intensity of service to ensure the patient's safety. The patient's presenting symptoms, physical exam findings, and initial radiographic and laboratory data in the context of their chronic comorbidities is felt to place them at high risk for further clinical deterioration. Furthermore, it is not anticipated that the patient will be medically stable for discharge from the hospital within 2 midnights of admission. The following factors support the patient status of inpatient.   " The patient's presenting symptoms include shortness of breath.  " The worrisome physical exam findings include bilateral wheezing and crackles. " The initial radiographic and laboratory data are worrisome because of evidence of fluid overload and infiltrates. " The chronic co-morbidities  include coronary artery disease.   * I certify that at the point of admission it is my clinical judgment that the patient will require inpatient hospital care spanning beyond 2 midnights from the point of admission due to high intensity of service, high risk for further deterioration and high frequency of surveillance required.Lonia Blood*    GARBA,LAWAL MD [facility] Pager [phone]  If 7PM-7AM, please contact night-coverage [email or web address] Password [email or web address]  09/15/2018, 7:25 PM

## 2018-09-15 NOTE — ED Triage Notes (Signed)
Ems called out for stroke symptoms. History of stroke but family states symptoms are worse. C/o slurred speech, headache and generalized weakness. EMS reports O2 sats 87% on room air on scene. Placed pt on 2L per nasal cannula, sats came up to 94%

## 2018-09-15 NOTE — ED Provider Notes (Signed)
[facility] Provider Note   CSN: [id number] Arrival date & time: 09/15/18  1423    History   Chief Complaint No chief complaint on file.   HPI [name] is a 85 y.o. male.     The history is provided by the patient, the EMS personnel, a relative and medical records. No language interpreter was used.   [name] is a 85 y.o. male who presents to the Emergency Department complaining of AMS, possible stroke.  Level V caveat due to confusion.  Hx is provided by EMS, patient, and family . The patient complains of feeling weak and achy. He has no additional complaints. Per EMS the patient had slurred speech starting two hours prior to arrival. Per the sun he has been weak and his walking has been off as well as slurred speech when the sun came to pick him up from a brothers house today. He has been on his brother's house for the last two days it is unsure when his symptoms began. No reports of fevers. He does have a chronic cough, which is been significantly worse today. He did recently taken antibiotic for pneumonia that lasted about two weeks. He has been off the medication for a few days. He was also recently treated with Lasix but is no longer taking this medication. No known coronavirus contacts but he does live in a household that has a Haematologiststaff member that works in a nursing home with a COVID outbreak.   Past Medical History:  Diagnosis Date   Atrial fibrillation (HCC)    COPD (chronic obstructive pulmonary disease) (HCC)    CVA (cerebral infarction)    Diabetes mellitus without complication (HCC)    Essential hypertension, benign    Hard of hearing     Patient Active Problem List   Diagnosis Date Noted   HCAP (healthcare-associated pneumonia) 09/15/2018   Acute CHF (congestive heart failure) (HCC) 09/15/2018   Enterococcus UTI 08/07/2018   Neurocognitive deficits 08/07/2018   Urinary tract infection with hematuria      Malnutrition of moderate degree 07/31/2018   AMS (altered mental status) 07/14/2018   Altered mental status    Stroke (HCC) 07/10/2018   Requires supplemental oxygen 06/05/2018   Chest pain 03/29/2018   Atrial fibrillation (HCC) 05/20/2015   RBBB 05/20/2015   Prolonged Q-T interval on ECG 05/20/2015   CAD (coronary artery disease) 05/20/2015   History of CVA (cerebrovascular accident) 05/20/2015   Atrial fibrillation with RVR (HCC)    Bronchiectasis with acute exacerbation (HCC)    Unstable angina (HCC) 03/27/2013   COPD exacerbation (HCC) 03/25/2013   Diabetes mellitus type II, uncontrolled (HCC) 03/25/2013   Hypothyroidism 03/25/2013   BPH (benign prostatic hyperplasia) 03/25/2013   HTN (hypertension) 03/25/2013    Past Surgical History:  Procedure Laterality Date   HERNIA REPAIR     LEFT HEART CATHETERIZATION WITH CORONARY ANGIOGRAM N/A 03/28/2013   Procedure: LEFT HEART CATHETERIZATION WITH CORONARY ANGIOGRAM;  Surgeon: Peter M SwazilandJordan, MD;  [facility];  Service: Cardiovascular;  Laterality: N/A;        Home Medications    Prior to Admission medications   Medication Sig Start Date End Date Taking? Authorizing Provider  amLODipine (NORVASC) 5 MG tablet Take 1 tablet (5 mg total) by mouth daily. 08/08/18  Yes Lassen, Arlo C, PA-C  apixaban (ELIQUIS) 5 MG TABS tablet Take 1 tablet (5 mg total) by mouth 2 (two) times daily. 08/08/18  Yes  Trudie ReedLassen, Arlo C, PA-C  atorvastatin (LIPITOR) 80 MG tablet Take 1 tablet (80 mg total) by mouth daily at 6 PM. 08/08/18  Yes Lassen, Arlo C, PA-C  brimonidine (ALPHAGAN) 0.15 % ophthalmic solution Place 1 drop into both eyes 2 (two) times daily. 08/08/18  Yes Lassen, Arlo C, PA-C  budesonide-formoterol (SYMBICORT) 80-4.5 MCG/ACT inhaler Inhale 2 puffs into the lungs 2 (two) times daily. 08/08/18  Yes Lassen, Arlo C, PA-C  Carboxymethylcellulose Sodium 0.25 % SOLN Place 2 drops into both eyes 4 (four) times daily as  needed (for dry eyes). 08/08/18  Yes Lassen, Arlo C, PA-C  finasteride (PROSCAR) 5 MG tablet Take 1 tablet (5 mg total) by mouth daily. 08/08/18  Yes Lassen, Arlo C, PA-C  hydrochlorothiazide (HYDRODIURIL) 25 MG tablet Take 0.5 tablets (12.5 mg total) by mouth daily. 08/08/18  Yes Lassen, Arlo C, PA-C  Ipratropium-Albuterol (COMBIVENT RESPIMAT) 20-100 MCG/ACT AERS respimat Inhale 1 puff into the lungs every 6 (six) hours as needed for wheezing. 08/08/18  Yes Lassen, Arlo C, PA-C  levalbuterol (XOPENEX) 0.63 MG/3ML nebulizer solution Take 3 mLs (0.63 mg total) by nebulization every 6 (six) hours as needed for wheezing or shortness of breath. 08/08/18  Yes Trudie ReedLassen, Arlo C, PA-C  levothyroxine (SYNTHROID) 25 MCG tablet Take 2 tablets (50 mcg total) by mouth daily before breakfast. 08/08/18  Yes Trudie ReedLassen, Arlo C, PA-C  lisinopril (ZESTRIL) 40 MG tablet Take 0.5 tablets (20 mg total) by mouth daily. 08/08/18  Yes Trudie ReedLassen, Arlo C, PA-C  metFORMIN (GLUCOPHAGE) 500 MG tablet Take 1 tablet (500 mg total) by mouth 2 (two) times daily with a meal. 08/08/18  Yes Lassen, Arlo C, PA-C  metoprolol tartrate (LOPRESSOR) 25 MG tablet Take 1 tablet (25 mg total) by mouth 2 (two) times daily. 08/08/18  Yes Lassen, Arlo C, PA-C  Multiple Vitamins-Minerals (MULTIVITAMIN WITH MINERALS) tablet Take 1 tablet by mouth daily. 08/08/18  Yes Lassen, Arlo C, PA-C  nitroGLYCERIN (NITROSTAT) 0.4 MG SL tablet Place 1 tablet (0.4 mg total) under the tongue every 5 (five) minutes as needed for chest pain. Dissolve one tablet under the tongue every 5 minutes as needed for chest pain. Repeat every 5 minutes if needed for a total of 3 tablets in 15 minutes. If no relief, call 911. Patient taking differently: Place 0.4 mg under the tongue every 5 (five) minutes x 3 doses as needed for chest pain (CALL 9-1-1, IF NO RELIEF).  08/08/18  Yes Lassen, Arlo C, PA-C  olopatadine (PATANOL) 0.1 % ophthalmic solution Place 1 drop into both eyes 2 (two) times daily.  08/08/18  Yes Lassen, Arlo C, PA-C  omeprazole (PRILOSEC) 20 MG capsule Take 1 capsule (20 mg total) by mouth daily. . 08/08/18  Yes Trudie ReedLassen, Arlo C, PA-C  polyvinyl alcohol (LIQUIFILM TEARS) 1.4 % ophthalmic solution Place 1 drop into both eyes 4 (four) times daily. 08/08/18  Yes Lassen, Arlo C, PA-C  senna (SENOKOT) 8.6 MG TABS tablet Take 2 tablets (17.2 mg total) by mouth at bedtime. 08/08/18  Yes Edmon CrapeLassen, Arlo C, PA-C  Skin Protectants, Misc. (MINERIN) CREA Apply 1 application topically See admin instructions. APPLY TO TRUNK AND LEGS TWICE A DAY    Yes [provider]  tamsulosin (FLOMAX) 0.4 MG CAPS capsule Take 1 capsule (0.4 mg total) by mouth daily. 08/08/18  Yes Lassen, Arlo C, PA-C  traZODone (DESYREL) 50 MG tablet Take 0.5-1 tablets (25-50 mg total) by mouth at bedtime as needed for sleep. 09/05/18  Yes Bing NeighborsHarris, Kimberly S,  FNP  furosemide (LASIX) 20 MG tablet Take 1 tablet (20 mg total) by mouth daily for 3 days. Patient not taking: Reported on 09/15/2018 08/27/18 09/15/18  Albrizze, Caroleen HammanKaitlyn E, PA-C    Family History Family History  Problem Relation Age of Onset   CAD Son    Skin cancer Brother     Social History Social History   Tobacco Use   Smoking status: Former Smoker    Types: Cigarettes   Smokeless tobacco: Former NeurosurgeonUser  Substance Use Topics   Alcohol use: No   Drug use: No     Allergies   Patient has no known allergies.   Review of Systems Review of Systems  All other systems reviewed and are negative.    Physical Exam Updated Vital Signs BP (!) 165/77    Pulse 80    Temp 98 F (36.7 C) (Rectal)    Resp (!) 22    Ht 5\' 8"  (1.727 m)    Wt 72.6 kg    SpO2 93%    BMI 24.34 kg/m   Physical Exam Vitals signs and nursing note reviewed.  Constitutional:      Appearance: He is well-developed.  HENT:     Head: Normocephalic and atraumatic.  Cardiovascular:     Rate and Rhythm: Normal rate and regular rhythm.     Heart sounds: No murmur.  Pulmonary:      Effort: Pulmonary effort is normal. No respiratory distress.     Comments: Slight decrease in air movement bilaterally with fine crackles in the bases. Abdominal:     Palpations: Abdomen is soft.     Tenderness: There is no abdominal tenderness. There is no guarding or rebound.  Musculoskeletal:        General: No tenderness.  Skin:    General: Skin is warm and dry.  Neurological:     Mental Status: He is alert and oriented to person, place, and time.     Comments: Mildly confused. Fluent speech. Mild right-sided facial droop. Five out of five strength in all four extremities with sensation to light touch intact in all four extremities.  Psychiatric:        Mood and Affect: Mood normal.        Behavior: Behavior normal.      ED Treatments / Results  Labs (all labs ordered are listed, but only abnormal results are displayed) Labs Reviewed  COMPREHENSIVE METABOLIC PANEL - Abnormal; Notable for the following components:      Result Value   Glucose, Bld 145 (*)    Total Protein 6.2 (*)    All other components within normal limits  CBC WITH DIFFERENTIAL/PLATELET - Abnormal; Notable for the following components:   Hemoglobin 12.1 (*)    All other components within normal limits  BRAIN NATRIURETIC PEPTIDE - Abnormal; Notable for the following components:   B Natriuretic Peptide 724.0 (*)    All other components within normal limits  POCT I-STAT EG7 - Abnormal; Notable for the following components:   pCO2, Ven 61.3 (*)    pO2, Ven 165.0 (*)    HCT 38.0 (*)    Hemoglobin 12.9 (*)    All other components within normal limits  SARS CORONAVIRUS 2 (HOSPITAL ORDER, PERFORMED IN [facility] [place] [facility])  CULTURE, BLOOD (ROUTINE X 2)  CULTURE, BLOOD (ROUTINE X 2)  URINE CULTURE  LACTIC ACID, PLASMA  LACTIC ACID, PLASMA  URINALYSIS, ROUTINE W REFLEX MICROSCOPIC  I-STAT VENOUS BLOOD GAS, ED  EKG None  Radiology Ct Head Wo Contrast  Result Date: 09/15/2018 CLINICAL DATA:   Slurred speech, headache and generalized weakness. History of stroke. EXAM: CT HEAD WITHOUT CONTRAST TECHNIQUE: Contiguous axial images were obtained from the base of the skull through the vertex without intravenous contrast. COMPARISON:  07/14/2018 FINDINGS: Brain: No evidence of acute infarction, hemorrhage, or hydrocephalus. Stable large area of encephalomalacia from previous left MCA territory infarct. Lacunar infarct in the left pons is also stable. Brain parenchymal volume loss and microangiopathy. Vascular: Calcific atherosclerotic disease of the intra cavernous carotid arteries. Skull: Normal. Negative for fracture or focal lesion. Sinuses/Orbits: Polypoid mucosal thickening of the ethmoid sinuses. The remainder of the paranasal sinuses and mastoid air cells are well aerated. Other: None. IMPRESSION: 1. No acute intracranial abnormality. 2. Stable large area of encephalomalacia from previous left MCA territory infarct. 3. Stable lacunar infarct in the left pons. 4. Atrophy, chronic microvascular disease. Electronically Signed   By: Fidela Salisbury M.D.   On: 09/15/2018 16:03   Dg Chest Port 1 View  Result Date: 09/15/2018 CLINICAL DATA:  Shortness of breath EXAM: PORTABLE CHEST 1 VIEW COMPARISON:  08/27/2018, 07/31/2018 07/19/2018, 02/01/2018, CT 02/02/2018 FINDINGS: Emphysematous disease. Diffuse bilateral interstitial opacity some of which is felt secondary to chronic change. Overall interstitial opacities appear increased as compared with prior, suspect for acute superimposed interstitial edema or inflammatory process. Stable cardiomediastinal silhouette. No pneumothorax. Possible tiny right effusion. IMPRESSION: 1. Diffusely increased bilateral interstitial opacity, suspicious for acute interstitial edema or inflammatory process superimposed on underlying chronic interstitial disease. Possible tiny right effusion 2. Emphysematous disease Electronically Signed   By: Donavan Foil M.D.   On:  09/15/2018 15:05    Procedures Procedures (including critical care time)  Medications Ordered in ED Medications  vancomycin (VANCOCIN) IVPB 1000 mg/200 mL premix (has no administration in time range)  ceFEPIme (MAXIPIME) 1 g in sodium chloride 0.9 % 100 mL IVPB (0 g Intravenous Stopped 09/15/18 1712)  vancomycin (VANCOCIN) 1,500 mg in sodium chloride 0.9 % 500 mL IVPB (1,500 mg Intravenous New Bag/Given 09/15/18 1707)  methylPREDNISolone sodium succinate (SOLU-MEDROL) 125 mg/2 mL injection 125 mg (125 mg Intravenous Given 09/15/18 1709)  furosemide (LASIX) injection 40 mg (40 mg Intravenous Given 09/15/18 1836)     Initial Impression / Assessment and Plan / ED Course  I have reviewed the triage vital signs and the nursing notes.  Pertinent labs & imaging results that were available during my care of the patient were reviewed by me and considered in my medical decision making (see chart for details).        Patient here for evaluation of altered mental status, progression over the last two days. He is mildly confused on examination but non-toxic appearing. He does have mild increased work of breathing, crackles on lung exam. Chest x-ray with increased pulmonary vascular congestion, concern for possible CHF exacerbation versus pneumonia. He was treated with antibiotics for potential pneumonia, Lasix for possible CHF exacerbation as well as steroids and albuterol for potential COPD exacerbation. Hospitalist consulted for admission for observation given the change in his mental status and functional status at home.  Final Clinical Impressions(s) / ED Diagnoses   Final diagnoses:  None    ED Discharge Orders    None       Quintella Reichert, MD 09/15/18 1929

## 2018-09-16 DIAGNOSIS — I10 Essential (primary) hypertension: Secondary | ICD-10-CM

## 2018-09-16 DIAGNOSIS — I5031 Acute diastolic (congestive) heart failure: Secondary | ICD-10-CM

## 2018-09-16 DIAGNOSIS — R410 Disorientation, unspecified: Secondary | ICD-10-CM

## 2018-09-16 DIAGNOSIS — E039 Hypothyroidism, unspecified: Secondary | ICD-10-CM

## 2018-09-16 DIAGNOSIS — J441 Chronic obstructive pulmonary disease with (acute) exacerbation: Secondary | ICD-10-CM

## 2018-09-16 DIAGNOSIS — I482 Chronic atrial fibrillation, unspecified: Secondary | ICD-10-CM

## 2018-09-16 DIAGNOSIS — E1165 Type 2 diabetes mellitus with hyperglycemia: Secondary | ICD-10-CM

## 2018-09-16 LAB — GLUCOSE, CAPILLARY
Glucose-Capillary: 168 mg/dL — ABNORMAL HIGH (ref 70–99)
Glucose-Capillary: 165 mg/dL — ABNORMAL HIGH (ref 70–99)
Glucose-Capillary: 185 mg/dL — ABNORMAL HIGH (ref 70–99)
Glucose-Capillary: 238 mg/dL — ABNORMAL HIGH (ref 70–99)

## 2018-09-16 LAB — URINE CULTURE: Culture: NO GROWTH

## 2018-09-16 LAB — CBC
HCT: 39.7 % (ref 39.0–52.0)
Hemoglobin: 12.5 g/dL — ABNORMAL LOW (ref 13.0–17.0)
MCH: 28.2 pg (ref 26.0–34.0)
MCHC: 31.5 g/dL (ref 30.0–36.0)
MCV: 89.4 fL (ref 80.0–100.0)
Platelets: 173 10*3/uL (ref 150–400)
RBC: 4.44 MIL/uL (ref 4.22–5.81)
RDW: 13.4 % (ref 11.5–15.5)
WBC: 5.6 10*3/uL (ref 4.0–10.5)
nRBC: 0 % (ref 0.0–0.2)

## 2018-09-16 LAB — HIV ANTIBODY (ROUTINE TESTING W REFLEX): HIV Screen 4th Generation wRfx: NONREACTIVE

## 2018-09-16 LAB — STREP PNEUMONIAE URINARY ANTIGEN: Strep Pneumo Urinary Antigen: NEGATIVE

## 2018-09-16 MED ORDER — MOMETASONE FURO-FORMOTEROL FUM 100-5 MCG/ACT IN AERO
2.0000 | INHALATION_SPRAY | Freq: Two times a day (BID) | RESPIRATORY_TRACT | Status: DC
  Administered 2018-09-16 – 2018-09-18 (×4): 2 via RESPIRATORY_TRACT
  Filled 2018-09-16: qty 8.8

## 2018-09-16 MED ORDER — FINASTERIDE 5 MG PO TABS
5.0000 mg | ORAL_TABLET | Freq: Every day | ORAL | Status: DC
  Administered 2018-09-16 – 2018-09-18 (×3): 5 mg via ORAL
  Filled 2018-09-16 (×4): qty 1

## 2018-09-16 MED ORDER — HYDROCHLOROTHIAZIDE 25 MG PO TABS: 12.5000 mg | ORAL_TABLET | Freq: Every day | ORAL | Status: DC

## 2018-09-16 MED ORDER — HYDROCERIN EX CREA
1.0000 "application " | TOPICAL_CREAM | Freq: Two times a day (BID) | CUTANEOUS | Status: DC
  Administered 2018-09-16 – 2018-09-17 (×4): 1 via TOPICAL
  Filled 2018-09-16: qty 113

## 2018-09-16 MED ORDER — OLOPATADINE HCL 0.1 % OP SOLN
1.0000 [drp] | Freq: Two times a day (BID) | OPHTHALMIC | Status: DC
  Administered 2018-09-16 – 2018-09-18 (×4): 1 [drp] via OPHTHALMIC
  Filled 2018-09-16: qty 5

## 2018-09-16 MED ORDER — PANTOPRAZOLE SODIUM 40 MG PO TBEC
40.0000 mg | DELAYED_RELEASE_TABLET | Freq: Every day | ORAL | Status: DC
  Administered 2018-09-16 – 2018-09-18 (×3): 40 mg via ORAL
  Filled 2018-09-16 (×3): qty 1

## 2018-09-16 MED ORDER — FUROSEMIDE 10 MG/ML IJ SOLN
40.0000 mg | Freq: Two times a day (BID) | INTRAMUSCULAR | Status: DC
  Administered 2018-09-16 – 2018-09-18 (×5): 40 mg via INTRAVENOUS
  Filled 2018-09-16 (×5): qty 4

## 2018-09-16 MED ORDER — LEVOTHYROXINE SODIUM 50 MCG PO TABS
50.0000 ug | ORAL_TABLET | Freq: Every day | ORAL | Status: DC
  Administered 2018-09-16 – 2018-09-17 (×2): 50 ug via ORAL
  Filled 2018-09-16 (×3): qty 1

## 2018-09-16 MED ORDER — METOPROLOL TARTRATE 25 MG PO TABS
25.0000 mg | ORAL_TABLET | Freq: Two times a day (BID) | ORAL | Status: DC
  Administered 2018-09-16 – 2018-09-18 (×4): 25 mg via ORAL
  Filled 2018-09-16 (×5): qty 1

## 2018-09-16 MED ORDER — ADULT MULTIVITAMIN W/MINERALS CH
1.0000 | ORAL_TABLET | Freq: Every day | ORAL | Status: DC
  Administered 2018-09-16 – 2018-09-18 (×3): 1 via ORAL
  Filled 2018-09-16 (×3): qty 1

## 2018-09-16 MED ORDER — NITROGLYCERIN 0.4 MG SL SUBL: 0.4000 mg | SUBLINGUAL_TABLET | SUBLINGUAL | Status: DC | PRN

## 2018-09-16 MED ORDER — BRIMONIDINE TARTRATE 0.2 % OP SOLN
1.0000 [drp] | Freq: Two times a day (BID) | OPHTHALMIC | Status: DC
  Administered 2018-09-16 – 2018-09-18 (×4): 1 [drp] via OPHTHALMIC
  Filled 2018-09-16: qty 5

## 2018-09-16 MED ORDER — METFORMIN HCL 500 MG PO TABS
500.0000 mg | ORAL_TABLET | Freq: Two times a day (BID) | ORAL | Status: DC
  Administered 2018-09-16 – 2018-09-17 (×4): 500 mg via ORAL
  Filled 2018-09-16 (×4): qty 1

## 2018-09-16 MED ORDER — TAMSULOSIN HCL 0.4 MG PO CAPS
0.4000 mg | ORAL_CAPSULE | Freq: Every day | ORAL | Status: DC
  Administered 2018-09-16 – 2018-09-18 (×3): 0.4 mg via ORAL
  Filled 2018-09-16 (×3): qty 1

## 2018-09-16 MED ORDER — AMLODIPINE BESYLATE 5 MG PO TABS
5.0000 mg | ORAL_TABLET | Freq: Every day | ORAL | Status: DC
  Administered 2018-09-16 – 2018-09-18 (×3): 5 mg via ORAL
  Filled 2018-09-16 (×3): qty 1

## 2018-09-16 MED ORDER — SENNA 8.6 MG PO TABS
2.0000 | ORAL_TABLET | Freq: Every day | ORAL | Status: DC
  Administered 2018-09-16: 17.2 mg via ORAL
  Filled 2018-09-16: qty 2

## 2018-09-16 MED ORDER — POLYVINYL ALCOHOL 1.4 % OP SOLN: 2.0000 [drp] | Freq: Four times a day (QID) | OPHTHALMIC | Status: DC | PRN

## 2018-09-16 MED ORDER — LISINOPRIL 20 MG PO TABS
20.0000 mg | ORAL_TABLET | Freq: Every day | ORAL | Status: DC
  Administered 2018-09-16 – 2018-09-18 (×3): 20 mg via ORAL
  Filled 2018-09-16 (×3): qty 1

## 2018-09-16 MED ORDER — ATORVASTATIN CALCIUM 80 MG PO TABS
80.0000 mg | ORAL_TABLET | Freq: Every day | ORAL | Status: DC
  Administered 2018-09-16 – 2018-09-17 (×2): 80 mg via ORAL
  Filled 2018-09-16 (×2): qty 1

## 2018-09-16 MED ORDER — TRAZODONE HCL 50 MG PO TABS
25.0000 mg | ORAL_TABLET | Freq: Every evening | ORAL | Status: DC | PRN
  Administered 2018-09-16: 50 mg via ORAL
  Filled 2018-09-16: qty 1

## 2018-09-16 MED ORDER — POLYVINYL ALCOHOL 1.4 % OP SOLN
1.0000 [drp] | Freq: Three times a day (TID) | OPHTHALMIC | Status: DC
  Administered 2018-09-16 – 2018-09-18 (×7): 1 [drp] via OPHTHALMIC
  Filled 2018-09-16 (×2): qty 15

## 2018-09-16 MED ORDER — APIXABAN 5 MG PO TABS
5.0000 mg | ORAL_TABLET | Freq: Two times a day (BID) | ORAL | Status: DC
  Administered 2018-09-16 – 2018-09-18 (×4): 5 mg via ORAL
  Filled 2018-09-16 (×6): qty 1

## 2018-09-16 MED ORDER — HEPARIN SODIUM (PORCINE) 5000 UNIT/ML IJ SOLN: 5000.0000 [IU] | Freq: Three times a day (TID) | INTRAMUSCULAR | Status: DC

## 2018-09-16 MED ORDER — IPRATROPIUM-ALBUTEROL 0.5-2.5 (3) MG/3ML IN SOLN: 3.0000 mL | Freq: Four times a day (QID) | RESPIRATORY_TRACT | Status: DC | PRN

## 2018-09-16 NOTE — ED Notes (Signed)
Attempted report 

## 2018-09-16 NOTE — ED Notes (Signed)
ED TO INPATIENT HANDOFF REPORT  ED Nurse Name and Phone #: Lorin PicketScott, [phone]  S Name/Age/Gender [name] 85 y.o. male Room/Bed: 041C/041C  Code Status   Code Status: DNR  Home/SNF/Other Home Patient oriented to: self Is this baseline? No   Triage Complete: Triage complete  Chief Complaint ams and weakness  Triage Note Ems called out for stroke symptoms. History of stroke but family states symptoms are worse. C/o slurred speech, headache and generalized weakness. EMS reports O2 sats 87% on room air on scene. Placed pt on 2L per nasal cannula, sats came up to 94%   Allergies No Known Allergies  Level of Care/Admitting Diagnosis ED Disposition    ED Disposition Condition Comment   Admit  [facility] [[id number]]  Level of Care: Telemetry Medical [104]  Covid Evaluation: Confirmed COVID Negative  Diagnosis: HCAP (healthcare-associated pneumonia) [[id number]][[id number]]  Admitting Physician: Rometta EmeryGARBA, MOHAMMAD L [[id number]]  Attending Physician: Rometta EmeryGARBA, MOHAMMAD L [[id number]]  Estimated length of stay: past midnight tomorrow  Certification:: I certify this patient will need inpatient services for at least 2 midnights  PT Class (Do Not Modify): Inpatient [101]  PT Acc Code (Do Not Modify): Private [1]       B Medical/Surgery History Past Medical History:  Diagnosis Date  . Atrial fibrillation (HCC)   . COPD (chronic obstructive pulmonary disease) (HCC)   . CVA (cerebral infarction)   . Diabetes mellitus without complication (HCC)   . Essential hypertension, benign   . Hard of hearing    Past Surgical History:  Procedure Laterality Date  . HERNIA REPAIR    . LEFT HEART CATHETERIZATION WITH CORONARY ANGIOGRAM N/A 03/28/2013   Procedure: LEFT HEART CATHETERIZATION WITH CORONARY ANGIOGRAM;  Surgeon: Peter M SwazilandJordan, MD;  [facility];  Service: Cardiovascular;  Laterality: N/A;     A IV Location/Drains/Wounds Patient Lines/Drains/Airways Status    Active Line/Drains/Airways    Name:   Placement date:   Placement time:   Site:   Days:   Peripheral IV 09/15/18 Left Antecubital   09/15/18    1600    Antecubital   1   Peripheral IV 09/15/18 Left Forearm   09/15/18    -    Forearm   1          Intake/Output Last 24 hours  Intake/Output Summary (Last 24 hours) at 09/16/2018 [id number] Last data filed at 09/15/2018 2024 Gross per 24 hour  Intake 600 ml  Output 900 ml  Net -300 ml    Labs/Imaging Results for orders placed or performed during the hospital encounter of 09/15/18 (from the past 48 hour(s))  Lactic acid, plasma     Status: None   Collection Time: 09/15/18  2:39 PM  Result Value Ref Range   Lactic Acid, Venous 1.6 0.5 - 1.9 mmol/L    Comment: Performed at [facility], [address] [place], [place] [address]  Comprehensive metabolic panel     Status: Abnormal   Collection Time: 09/15/18  2:39 PM  Result Value Ref Range   Sodium 143 135 - 145 mmol/L   Potassium 3.8 3.5 - 5.1 mmol/L   Chloride 108 98 - 111 mmol/L   CO2 26 22 - 32 mmol/L   Glucose, Bld 145 (H) 70 - 99 mg/dL   BUN 13 8 - 23 mg/dL   Creatinine, Ser 6.291.08 0.61 - 1.24 mg/dL   Calcium 9.3 8.9 - 52.810.3 mg/dL   Total Protein 6.2 (L)  6.5 - 8.1 g/dL   Albumin 3.5 3.5 - 5.0 g/dL   AST 19 15 - 41 U/L   ALT 16 0 - 44 U/L   Alkaline Phosphatase 54 38 - 126 U/L   Total Bilirubin 0.8 0.3 - 1.2 mg/dL   GFR calc non Af Amer >60 >60 mL/min   GFR calc Af Amer >60 >60 mL/min   Anion gap 9 5 - 15    Comment: Performed at [facility], [address] [place], [place] [address]  CBC WITH DIFFERENTIAL     Status: Abnormal   Collection Time: 09/15/18  2:39 PM  Result Value Ref Range   WBC 5.3 4.0 - 10.5 K/uL   RBC 4.28 4.22 - 5.81 MIL/uL   Hemoglobin 12.1 (L) 13.0 - 17.0 g/dL   HCT 47.839.1 29.539.0 - 62.152.0 %   MCV 91.4 80.0 - 100.0 fL   MCH 28.3 26.0 - 34.0 pg   MCHC 30.9 30.0 - 36.0 g/dL   RDW 30.813.8 65.711.5 - 84.615.5 %   Platelets 171 150 - 400 K/uL   nRBC 0.0 0.0 -  0.2 %   Neutrophils Relative % 63 %   Neutro Abs 3.3 1.7 - 7.7 K/uL   Lymphocytes Relative 18 %   Lymphs Abs 1.0 0.7 - 4.0 K/uL   Monocytes Relative 9 %   Monocytes Absolute 0.5 0.1 - 1.0 K/uL   Eosinophils Relative 9 %   Eosinophils Absolute 0.5 0.0 - 0.5 K/uL   Basophils Relative 1 %   Basophils Absolute 0.1 0.0 - 0.1 K/uL   Immature Granulocytes 0 %   Abs Immature Granulocytes 0.02 0.00 - 0.07 K/uL    Comment: Performed at [facility], [address] [place], [place] [address]  Brain natriuretic peptide     Status: Abnormal   Collection Time: 09/15/18  2:39 PM  Result Value Ref Range   B Natriuretic Peptide 724.0 (H) 0.0 - 100.0 pg/mL    Comment: Performed at [facility], [address] [place], [place] [address]  SARS Coronavirus 2 (CEPHEID- Performed in [facility] hospital lab), Hosp Order     Status: None   Collection Time: 09/15/18  2:51 PM   Specimen: Nasopharyngeal Swab  Result Value Ref Range   SARS Coronavirus 2 NEGATIVE NEGATIVE    Comment: (NOTE) If result is NEGATIVE SARS-CoV-2 target nucleic acids are NOT DETECTED. The SARS-CoV-2 RNA is generally detectable in upper and lower  respiratory specimens during the acute phase of infection. The lowest  concentration of SARS-CoV-2 viral copies this assay can detect is 250  copies / mL. A negative result does not preclude SARS-CoV-2 infection  and should not be used as the sole basis for treatment or other  patient management decisions.  A negative result may occur with  improper specimen collection / handling, submission of specimen other  than nasopharyngeal swab, presence of viral mutation(s) within the  areas targeted by this assay, and inadequate number of viral copies  (<250 copies / mL). A negative result must be combined with clinical  observations, patient history, and epidemiological information. If result is POSITIVE SARS-CoV-2 target nucleic acids are DETECTED. The SARS-CoV-2 RNA is  generally detectable in upper and lower  respiratory specimens dur ing the acute phase of infection.  Positive  results are indicative of active infection with SARS-CoV-2.  Clinical  correlation with patient history and other diagnostic information is  necessary to determine patient infection status.  Positive  results do  not rule out bacterial infection or co-infection with other viruses. If result is PRESUMPTIVE POSTIVE SARS-CoV-2 nucleic acids MAY BE PRESENT.   A presumptive positive result was obtained on the submitted specimen  and confirmed on repeat testing.  While 2019 novel coronavirus  (SARS-CoV-2) nucleic acids may be present in the submitted sample  additional confirmatory testing may be necessary for epidemiological  and / or clinical management purposes  to differentiate between  SARS-CoV-2 and other Sarbecovirus currently known to infect humans.  If clinically indicated additional testing with an alternate test  methodology [phone] is advised. The SARS-CoV-2 RNA is generally  detectable in upper and lower respiratory sp ecimens during the acute  phase of infection. The expected result is Negative. Fact Sheet for Patients:  BoilerBrush.com.cyhttps://www.fda.gov/media/136312/download Fact Sheet for Healthcare Providers: https://pope.com/https://www.fda.gov/media/136313/download This test is not yet approved or cleared by the [place] FDA and has been authorized for detection and/or diagnosis of SARS-CoV-2 by FDA under an Emergency Use Authorization (EUA).  This EUA will remain in effect (meaning this test can be used) for the duration of the COVID-19 declaration under Section 564(b)(1) of the Act, 21 U.S.C. section 360bbb-3(b)(1), unless the authorization is terminated or revoked sooner. Performed at [facility], [address] [place], [place] [address]   POCT I-Stat EG7     Status: Abnormal   Collection Time: 09/15/18  3:34 PM  Result Value Ref Range   pH, Ven 7.262 7.250 - 7.430    pCO2, Ven 61.3 (H) 44.0 - 60.0 mmHg   pO2, Ven 165.0 (H) 32.0 - 45.0 mmHg   Bicarbonate 27.6 20.0 - 28.0 mmol/L   TCO2 29 22 - 32 mmol/L   O2 Saturation 99.0 %   Acid-base deficit 1.0 0.0 - 2.0 mmol/L   Sodium 144 135 - 145 mmol/L   Potassium 3.8 3.5 - 5.1 mmol/L   Calcium, Ion 1.29 1.15 - 1.40 mmol/L   HCT 38.0 (L) 39.0 - 52.0 %   Hemoglobin 12.9 (L) 13.0 - 17.0 g/dL   Patient temperature HIDE    Sample type VENOUS   Lactic acid, plasma     Status: None   Collection Time: 09/15/18  4:11 PM  Result Value Ref Range   Lactic Acid, Venous 0.9 0.5 - 1.9 mmol/L    Comment: Performed at [facility], [address] [place], [place] [address]  Urinalysis, Routine w reflex microscopic     Status: None   Collection Time: 09/15/18  4:14 PM  Result Value Ref Range   Color, Urine YELLOW YELLOW   APPearance CLEAR CLEAR   Specific Gravity, Urine 1.011 1.005 - 1.030   pH 7.0 5.0 - 8.0   Glucose, UA NEGATIVE NEGATIVE mg/dL   Hgb urine dipstick NEGATIVE NEGATIVE   Bilirubin Urine NEGATIVE NEGATIVE   Ketones, ur NEGATIVE NEGATIVE mg/dL   Protein, ur NEGATIVE NEGATIVE mg/dL   Nitrite NEGATIVE NEGATIVE   Leukocytes,Ua NEGATIVE NEGATIVE    Comment: Performed at [facility], [address] [place], [place] [address]  CBG monitoring, ED     Status: Abnormal   Collection Time: 09/15/18 10:28 PM  Result Value Ref Range   Glucose-Capillary 187 (H) 70 - 99 mg/dL  Strep pneumoniae urinary antigen     Status: None   Collection Time: 09/16/18  5:25 AM  Result Value Ref Range   Strep Pneumo Urinary Antigen NEGATIVE NEGATIVE    Comment:        Infection  due to S. pneumoniae cannot be absolutely ruled out since the antigen present may be below the detection limit of the test. Performed at [facility], [address] [place], [place] [address]   CBC     Status: Abnormal   Collection Time: 09/16/18  6:08 AM  Result Value Ref Range   WBC 5.6 4.0 - 10.5 K/uL   RBC 4.44 4.22 -  5.81 MIL/uL   Hemoglobin 12.5 (L) 13.0 - 17.0 g/dL   HCT 39.7 39.0 - 52.0 %   MCV 89.4 80.0 - 100.0 fL   MCH 28.2 26.0 - 34.0 pg   MCHC 31.5 30.0 - 36.0 g/dL   RDW 13.4 11.5 - 15.5 %   Platelets 173 150 - 400 K/uL   nRBC 0.0 0.0 - 0.2 %    Comment: Performed at [facility], [place] [address] [place], [place] [address]   Ct Head Wo Contrast  Result Date: 09/15/2018 CLINICAL DATA:  Slurred speech, headache and generalized weakness. History of stroke. EXAM: CT HEAD WITHOUT CONTRAST TECHNIQUE: Contiguous axial images were obtained from the base of the skull through the vertex without intravenous contrast. COMPARISON:  07/14/2018 FINDINGS: Brain: No evidence of acute infarction, hemorrhage, or hydrocephalus. Stable large area of encephalomalacia from previous left MCA territory infarct. Lacunar infarct in the left pons is also stable. Brain parenchymal volume loss and microangiopathy. Vascular: Calcific atherosclerotic disease of the intra cavernous carotid arteries. Skull: Normal. Negative for fracture or focal lesion. Sinuses/Orbits: Polypoid mucosal thickening of the ethmoid sinuses. The remainder of the paranasal sinuses and mastoid air cells are well aerated. Other: None. IMPRESSION: 1. No acute intracranial abnormality. 2. Stable large area of encephalomalacia from previous left MCA territory infarct. 3. Stable lacunar infarct in the left pons. 4. Atrophy, chronic microvascular disease. Electronically Signed   By: Fidela Salisbury M.D.   On: 09/15/2018 16:03   Dg Chest Port 1 View  Result Date: 09/15/2018 CLINICAL DATA:  Shortness of breath EXAM: PORTABLE CHEST 1 VIEW COMPARISON:  08/27/2018, 07/31/2018 07/19/2018, 02/01/2018, CT 02/02/2018 FINDINGS: Emphysematous disease. Diffuse bilateral interstitial opacity some of which is felt secondary to chronic change. Overall interstitial opacities appear increased as compared with prior, suspect for acute superimposed interstitial edema or  inflammatory process. Stable cardiomediastinal silhouette. No pneumothorax. Possible tiny right effusion. IMPRESSION: 1. Diffusely increased bilateral interstitial opacity, suspicious for acute interstitial edema or inflammatory process superimposed on underlying chronic interstitial disease. Possible tiny right effusion 2. Emphysematous disease Electronically Signed   By: Donavan Foil M.D.   On: 09/15/2018 15:05    Pending Labs Unresulted Labs (From admission, onward)    Start     Ordered   09/16/18 0443  Culture, sputum-assessment  Once,   R     09/16/18 0442   09/16/18 0443  HIV antibody (Routine Screening)  Once,   STAT     09/16/18 0442   09/15/18 1440  Urine culture  ONCE - STAT,   STAT     09/15/18 1440   09/15/18 1439  Blood Culture (routine x 2)  BLOOD CULTURE X 2,   STAT     09/15/18 1440          Vitals/Pain Today's Vitals   09/16/18 0530 09/16/18 0600 09/16/18 0615 09/16/18 0630  BP: (!) 145/61 (!) 143/62  (!) 132/55  Pulse: 80 73  67  Resp: 19 20  17   Temp:      TempSrc:      SpO2:  96% 94%  93%  Weight:      Height:      PainSc: 0-No pain  0-No pain     Isolation Precautions Droplet and Contact precautions  Medications Medications  vancomycin (VANCOCIN) IVPB 1000 mg/200 mL premix (has no administration in time range)  hydrocerin (EUCERIN) cream 1 application (has no administration in time range)  amLODipine (NORVASC) tablet 5 mg (has no administration in time range)  apixaban (ELIQUIS) tablet 5 mg (has no administration in time range)  atorvastatin (LIPITOR) tablet 80 mg (has no administration in time range)  brimonidine (ALPHAGAN) 0.2 % ophthalmic solution 1 drop (has no administration in time range)  mometasone-formoterol (DULERA) 100-5 MCG/ACT inhaler 2 puff (has no administration in time range)  polyvinyl alcohol (LIQUIFILM TEARS) 1.4 % ophthalmic solution 2 drop (has no administration in time range)  finasteride (PROSCAR) tablet 5 mg (has no  administration in time range)  hydrochlorothiazide (HYDRODIURIL) tablet 12.5 mg (has no administration in time range)  ipratropium-albuterol (DUONEB) 0.5-2.5 (3) MG/3ML nebulizer solution 3 mL (has no administration in time range)  levothyroxine (SYNTHROID) tablet 50 mcg (has no administration in time range)  lisinopril (ZESTRIL) tablet 20 mg (has no administration in time range)  metFORMIN (GLUCOPHAGE) tablet 500 mg (has no administration in time range)  metoprolol tartrate (LOPRESSOR) tablet 25 mg (has no administration in time range)  multivitamin with minerals tablet 1 tablet (has no administration in time range)  nitroGLYCERIN (NITROSTAT) SL tablet 0.4 mg (has no administration in time range)  olopatadine (PATANOL) 0.1 % ophthalmic solution 1 drop (has no administration in time range)  pantoprazole (PROTONIX) EC tablet 40 mg (has no administration in time range)  polyvinyl alcohol (LIQUIFILM TEARS) 1.4 % ophthalmic solution 1 drop (has no administration in time range)  senna (SENOKOT) tablet 17.2 mg (has no administration in time range)  tamsulosin (FLOMAX) capsule 0.4 mg (has no administration in time range)  traZODone (DESYREL) tablet 25-50 mg (has no administration in time range)  furosemide (LASIX) injection 40 mg (has no administration in time range)  ceFEPIme (MAXIPIME) 2 g in sodium chloride 0.9 % 100 mL IVPB (0 g Intravenous Stopped 09/16/18 0441)  insulin aspart (novoLOG) injection 0-9 Units (has no administration in time range)  insulin aspart (novoLOG) injection 0-5 Units (0 Units Subcutaneous Not Given 09/15/18 2229)  predniSONE (DELTASONE) tablet 40 mg (has no administration in time range)  ceFEPIme (MAXIPIME) 1 g in sodium chloride 0.9 % 100 mL IVPB (0 g Intravenous Stopped 09/15/18 1712)  vancomycin (VANCOCIN) 1,500 mg in sodium chloride 0.9 % 500 mL IVPB (0 mg Intravenous Stopped 09/15/18 1931)  methylPREDNISolone sodium succinate (SOLU-MEDROL) 125 mg/2 mL injection 125 mg (125  mg Intravenous Given 09/15/18 1709)  furosemide (LASIX) injection 40 mg (40 mg Intravenous Given 09/15/18 1836)  HYDROcodone-acetaminophen (NORCO/VICODIN) 5-325 MG per tablet 1 tablet (1 tablet Oral Given 09/15/18 2126)    Mobility walks High fall risk   Focused Assessments       R Recommendations: See Admitting Provider Note  Report given to:   Additional Notes:

## 2018-09-16 NOTE — ED Notes (Signed)
Tele   Breakfast ordered  

## 2018-09-16 NOTE — ED Notes (Signed)
ORDERED BFAST TRAY  

## 2018-09-16 NOTE — Telephone Encounter (Signed)
Called [name], states he is admitted and she will call back to schedule this appointment when he is discharged.

## 2018-09-16 NOTE — Progress Notes (Addendum)
Progress Note    Bruce ElizabethWilburn L Prasad  MVH:[id number] DOB: 07/27/1927  DOA: 09/15/2018 PCP: Bing NeighborsHarris, Kimberly S, FNP    Brief Narrative:   Chief complaint: F/U AMS/HCAP.  Medical records reviewed and are as summarized below:  [name] is an 85 y.o. male with a PMH of atrial fibrillation, COPD, CVA, diabetes, hypertension, and hearing impairment who was admitted 09/15/2018 for evaluation of AMS associated with confusion and slurred speech x2 hours, but was back to baseline upon evaluation in the ED.  In the ED, he was mildly hypoxic with a oxygen saturation of 89% on room air.  BNP was 724 and lactic acid was 1.6.  X-ray in the ED showed pulmonary infiltrates and he was placed on antibiotics and diuretics.  Assessment/Plan:   Principal Problem:   HCAP (healthcare-associated pneumonia) associated with pulmonary edema concerning for acute diastolic CHF as well as acute encephalopathy Patient is noted to have 118 points on PSI score, and is class IV with 8.2-9.3% mortality risk.  Chest x-ray personally reviewed and shows bilateral interstitial infiltrates with a possible right pleural effusion, flattened diaphragms consistent with emphysema.  He was placed on cefepime and vancomycin to cover healthcare associated pathogens given hospitalization for evaluation of chest pain 07/31/2018-08/01/2018, and subsequent discharge to SNF.  Had only been home for 2 days before presenting back to the hospital.  Patient noted to be on Lasix at baseline but had not been taking it which may have contributed to pulmonary edema as well.  On Lasix 40 mg IV every 12 hours.  Also on hydrochlorothiazide, which I will hold for now.  2D echo done 15/20 showed an EF of greater than 65%, no reason to repeat at present.  Has required a Recruitment consultantsafety sitter given his acute encephalopathy (CT brain negative for acute findings).  No leukocytosis or lactic acidosis and strep pneumonia antigen negative.  COVID negative.  Follow-up blood  and sputum cultures.  Will consider narrowing antibiotics if repeat chest x-ray shows improvement as volume overload/pulmonary edema may clear allowing us more clarity about underlying consolidation. Still quite encephalopathic and confused, mildly toxic appearing. Will get ST evaluation.     Active Problems:   COPD exacerbation (HCC) Given 125 mg of Solu-Medrol on admission, currently on prednisone 40 mg daily.  Continue duo nebs every 6 hours as needed.  Oxygen saturations 90-94%.    Diabetes mellitus type II, uncontrolled (HCC) Currently being managed with metformin and insulin sensitive SSI before meals and at bedtime.  CBG 187 this morning.  Monitor closely.  Change diet to carbohydrate modified.    Hypothyroidism Continue Synthroid.    HTN (hypertension) Continue Norvasc, lisinopril, metoprolol and Lasix.  Blood pressure controlled.    Atrial fibrillation (HCC) Continue Eliquis.  Heart rate controlled on metoprolol and Norvasc.    CAD (coronary artery disease) Continue beta-blocker, statin, and nitroglycerin as needed.     Hyperlipidemia Continue Lipitor.    Glaucoma Continue Alphagan.  Body mass index is 24.34 kg/m.   Family Communication/Anticipated D/C date and plan/Code Status   DVT prophylaxis: Eliquis ordered. Code Status: DNR.  Family Communication: Son updated by telephone. Disposition Plan: Unclear.  May need placement.   Medical Consultants:    None.   Anti-Infectives:    Vancomycin 09/15/2018--->  Cefepime 09/15/2018--->  Subjective:   [name] is disoriented, confused and has a Recruitment consultantsafety sitter at the bedside who reports that he has had some agitation, trying to get out bed thinking someone was  after him. Per son, the patient is usually oriented to place, but he was not able to tell me that he was in the hospital today.  Objective:    Vitals:   09/16/18 0600 09/16/18 0630 09/16/18 0700 09/16/18 0730  BP: (!) 143/62 (!) 132/55 (!) 135/51  (!) 150/81  Pulse: 73 67 62 76  Resp: 20 17 17 17   Temp:      TempSrc:      SpO2: 94% 93% 90% 90%  Weight:      Height:        Intake/Output Summary (Last 24 hours) at 09/16/2018 0821 Last data filed at 09/15/2018 2024 Gross per 24 hour  Intake 600 ml  Output 900 ml  Net -300 ml   Filed Weights   09/15/18 1429  Weight: 72.6 kg    Exam: General: Disoriented, mildly toxic appearing. Cardiovascular: HSIR. No gallops or rubs. No murmurs. No JVD. Lungs: Clear to auscultation bilaterally with good air movement. Occasional rhonchi. Abdomen: Soft, nontender, nondistended with normal active bowel sounds. No masses. No hepatosplenomegaly. Neurological: Disoriented and restless. Skin: Warm and dry. No rashes or lesions. Extremities: No clubbing or cyanosis. No edema. Pedal pulses 2+. Psychiatric: Mood and affect are anxious. Insight and judgment are impaired.   Data Reviewed:   I have personally reviewed following labs and imaging studies:  Labs: Labs show the following:   Basic Metabolic Panel: Recent Labs  Lab 09/15/18 1439 09/15/18 1534  NA 143 144  K 3.8 3.8  CL 108  --   CO2 26  --   GLUCOSE 145*  --   BUN 13  --   CREATININE 1.08  --   CALCIUM 9.3  --    GFR Estimated Creatinine Clearance: 45.7 mL/min (by C-G formula based on SCr of 1.08 mg/dL). Liver Function Tests: Recent Labs  Lab 09/15/18 1439  AST 19  ALT 16  ALKPHOS 54  BILITOT 0.8  PROT 6.2*  ALBUMIN 3.5    CBC: Recent Labs  Lab 09/15/18 1439 09/15/18 1534 09/16/18 0608  WBC 5.3  --  5.6  NEUTROABS 3.3  --   --   HGB 12.1* 12.9* 12.5*  HCT 39.1 38.0* 39.7  MCV 91.4  --  89.4  PLT 171  --  173   CBG: Recent Labs  Lab 09/15/18 2228  GLUCAP 187*   Sepsis Labs: Recent Labs  Lab 09/15/18 1439 09/15/18 1611 09/16/18 0608  WBC 5.3  --  5.6  LATICACIDVEN 1.6 0.9  --     Microbiology Recent Results (from the past 240 hour(s))  SARS Coronavirus 2 (CEPHEID- Performed in [facility] hospital lab), Hosp Order     Status: None   Collection Time: 09/15/18  2:51 PM   Specimen: Nasopharyngeal Swab  Result Value Ref Range Status   SARS Coronavirus 2 NEGATIVE NEGATIVE Final    Comment: (NOTE) If result is NEGATIVE SARS-CoV-2 target nucleic acids are NOT DETECTED. The SARS-CoV-2 RNA is generally detectable in upper and lower  respiratory specimens during the acute phase of infection. The lowest  concentration of SARS-CoV-2 viral copies this assay can detect is 250  copies / mL. A negative result does not preclude SARS-CoV-2 infection  and should not be used as the sole basis for treatment or other  patient management decisions.  A negative result may occur with  improper specimen collection / handling, submission of specimen other  than nasopharyngeal swab, presence of viral mutation(s) within the  areas targeted by  this assay, and inadequate number of viral copies  (<250 copies / mL). A negative result must be combined with clinical  observations, patient history, and epidemiological information. If result is POSITIVE SARS-CoV-2 target nucleic acids are DETECTED. The SARS-CoV-2 RNA is generally detectable in upper and lower  respiratory specimens dur ing the acute phase of infection.  Positive  results are indicative of active infection with SARS-CoV-2.  Clinical  correlation with patient history and other diagnostic information is  necessary to determine patient infection status.  Positive results do  not rule out bacterial infection or co-infection with other viruses. If result is PRESUMPTIVE POSTIVE SARS-CoV-2 nucleic acids MAY BE PRESENT.   A presumptive positive result was obtained on the submitted specimen  and confirmed on repeat testing.  While 2019 novel coronavirus  (SARS-CoV-2) nucleic acids may be present in the submitted sample  additional confirmatory testing may be necessary for epidemiological  and / or clinical management purposes  to  differentiate between  SARS-CoV-2 and other Sarbecovirus currently known to infect humans.  If clinically indicated additional testing with an alternate test  methodology [id number]) is advised. The SARS-CoV-2 RNA is generally  detectable in upper and lower respiratory sp ecimens during the acute  phase of infection. The expected result is Negative. Fact Sheet for Patients:  BoilerBrush.com.cyhttps://www.fda.gov/media/136312/download Fact Sheet for Healthcare Providers: https://pope.com/https://www.fda.gov/media/136313/download This test is not yet approved or cleared by the [place] FDA and has been authorized for detection and/or diagnosis of SARS-CoV-2 by FDA under an Emergency Use Authorization (EUA).  This EUA will remain in effect (meaning this test can be used) for the duration of the COVID-19 declaration under Section 564(b)(1) of the Act, 21 U.S.C. section 360bbb-3(b)(1), unless the authorization is terminated or revoked sooner. Performed at [facility], [address] [place], [place] [address]     Procedures and diagnostic studies:  Ct Head Wo Contrast  Result Date: 09/15/2018 CLINICAL DATA:  Slurred speech, headache and generalized weakness. History of stroke. EXAM: CT HEAD WITHOUT CONTRAST TECHNIQUE: Contiguous axial images were obtained from the base of the skull through the vertex without intravenous contrast. COMPARISON:  07/14/2018 FINDINGS: Brain: No evidence of acute infarction, hemorrhage, or hydrocephalus. Stable large area of encephalomalacia from previous left MCA territory infarct. Lacunar infarct in the left pons is also stable. Brain parenchymal volume loss and microangiopathy. Vascular: Calcific atherosclerotic disease of the intra cavernous carotid arteries. Skull: Normal. Negative for fracture or focal lesion. Sinuses/Orbits: Polypoid mucosal thickening of the ethmoid sinuses. The remainder of the paranasal sinuses and mastoid air cells are well aerated. Other: None. IMPRESSION: 1. No  acute intracranial abnormality. 2. Stable large area of encephalomalacia from previous left MCA territory infarct. 3. Stable lacunar infarct in the left pons. 4. Atrophy, chronic microvascular disease. Electronically Signed   By: Ted Mcalpineobrinka  Dimitrova M.D.   On: 09/15/2018 16:03   Dg Chest Port 1 View  Result Date: 09/15/2018 CLINICAL DATA:  Shortness of breath EXAM: PORTABLE CHEST 1 VIEW COMPARISON:  08/27/2018, 07/31/2018 07/19/2018, 02/01/2018, CT 02/02/2018 FINDINGS: Emphysematous disease. Diffuse bilateral interstitial opacity some of which is felt secondary to chronic change. Overall interstitial opacities appear increased as compared with prior, suspect for acute superimposed interstitial edema or inflammatory process. Stable cardiomediastinal silhouette. No pneumothorax. Possible tiny right effusion. IMPRESSION: 1. Diffusely increased bilateral interstitial opacity, suspicious for acute interstitial edema or inflammatory process superimposed on underlying chronic interstitial disease. Possible tiny right effusion 2. Emphysematous disease Electronically Signed   By: Selena BattenKim  Jake SamplesFujinaga M.D.   On: 09/15/2018 15:05    Medications:    amLODipine  5 mg Oral Daily   apixaban  5 mg Oral BID   atorvastatin  80 mg Oral q1800   brimonidine  1 drop Both Eyes BID   finasteride  5 mg Oral Daily   furosemide  40 mg Intravenous Q12H   hydrocerin  1 application Topical BID   hydrochlorothiazide  12.5 mg Oral Daily   insulin aspart  0-5 Units Subcutaneous QHS   insulin aspart  0-9 Units Subcutaneous TID WC   levothyroxine  50 mcg Oral QAC breakfast   lisinopril  20 mg Oral Daily   metFORMIN  500 mg Oral BID WC   metoprolol tartrate  25 mg Oral BID   mometasone-formoterol  2 puff Inhalation BID   multivitamin with minerals  1 tablet Oral Daily   olopatadine  1 drop Both Eyes BID   pantoprazole  40 mg Oral Daily   polyvinyl alcohol  1 drop Both Eyes TID AC & HS   predniSONE  40 mg Oral  BID WC   senna  2 tablet Oral QHS   tamsulosin  0.4 mg Oral Daily   Continuous Infusions:  ceFEPime (MAXIPIME) IV Stopped (09/16/18 0441)   vancomycin       LOS: 1 day   Hillery Aldohristina Khiana Camino  [facility] Pager [phone].   *Please refer to [email or web address], password [email or web address] to get updated schedule on who will round on this patient, as hospitalists switch teams weekly. If 7PM-7AM, please contact night-coverage at [email or web address], password [email or web address] for any overnight needs.  09/16/2018, 8:21 AM

## 2018-09-16 NOTE — Evaluation (Signed)
Clinical/Bedside Swallow Evaluation Patient Details  Name: [name] MRN: [id number] Date of Birth: 06/15/1927  Today's Date: 09/16/2018 Time: SLP Start Time (ACUTE ONLY): 1445 SLP Stop Time (ACUTE ONLY): 1505 SLP Time Calculation (min) (ACUTE ONLY): 20 min  Past Medical History:  Past Medical History:  Diagnosis Date  . Atrial fibrillation (HCC)   . COPD (chronic obstructive pulmonary disease) (HCC)   . CVA (cerebral infarction)   . Diabetes mellitus without complication (HCC)   . Essential hypertension, benign   . Hard of hearing    Past Surgical History:  Past Surgical History:  Procedure Laterality Date  . HERNIA REPAIR    . LEFT HEART CATHETERIZATION WITH CORONARY ANGIOGRAM N/A 03/28/2013   Procedure: LEFT HEART CATHETERIZATION WITH CORONARY ANGIOGRAM;  Surgeon: Peter M SwazilandJordan, MD;  [facility];  Service: Cardiovascular;  Laterality: N/A;   HPI:  [name] with PMH: afib, COPD, previous CVA with aphasia, DM, HTN, difficulty hearing, who was brought to hospital from home secondary to AMS and confusion as well as slurred speech which resolved when in ED. CXR revealed pulmonary infiltrates consistent with fluid overload vs PNA. He was recently treated in hospital for PNA.   Assessment / Plan / Recommendation Clinical Impression  Patient presents with a mild oropharyngeal dysphagia with immediate and delayed coughing with mixed consistencies of regular solids (graham crackers) and thin liquid (water) and delayed mastication of hard solids, likely due in part to no bottom dentures and top dentures in poor condition. Patient was recently hospitalized for PNA and had an MBS completed at that time, which determined his swallow was functional with swallow initaition delays, minimal penetration and no aspiration. Do not suspect that patient's swallow function has changed significantly in that short span of time. SLP Visit Diagnosis: Dysphagia, unspecified  (R13.10)    Aspiration Risk  Mild aspiration risk    Diet Recommendation Dysphagia 3 (Mech soft);Thin liquid   Liquid Administration via: Straw;Cup Medication Administration: Whole meds with puree Supervision: Patient able to self feed;Full supervision/cueing for compensatory strategies Compensations: Minimize environmental distractions;Slow rate;Small sips/bites Postural Changes: Seated upright at 90 degrees    Other  Recommendations Oral Care Recommendations: Oral care BID   Follow up Recommendations None      Frequency and Duration min 1 x/week  1 week       Prognosis Prognosis for Safe Diet Advancement: Good      Swallow Study   General Date of Onset: 09/15/18 HPI: [name] with PMH: afib, COPD, previous CVA with aphasia, DM, HTN, difficulty hearing, who was brought to hospital from home secondary to AMS and confusion as well as slurred speech which resolved when in ED. CXR revealed pulmonary infiltrates consistent with fluid overload vs PNA. He was recently treated in hospital for PNA. Type of Study: Bedside Swallow Evaluation Previous Swallow Assessment: MBS on 07/11/18 during previous hospitalization for PNA, functional swallow, did not recommend any consistency changes. Diet Prior to this Study: Regular;Thin liquids Temperature Spikes Noted: No History of Recent Intubation: No Behavior/Cognition: Alert;Cooperative;Pleasant mood Oral Cavity Assessment: Within Functional Limits Oral Care Completed by SLP: Yes Oral Cavity - Dentition: Dentures, top;Other (Comment)(no bottom dentures, top dentures fit well but missing front three teeth) Self-Feeding Abilities: Needs set up;Able to feed self Patient Positioning: Upright in bed Baseline Vocal Quality: Normal Volitional Cough: Strong Volitional Swallow: Unable to elicit    Oral/Motor/Sensory Function Overall Oral Motor/Sensory Function: Within functional limits  Ice Chips     Thin Liquid Thin Liquid:  Impaired Presentation: Straw;Self Fed Pharyngeal  Phase Impairments: Cough - Delayed Other Comments: suspect combination of regular solids and thin liquids (eating graham cracker and taking sips of water)    Nectar Thick     Honey Thick     Puree Puree: Within functional limits   Solid     Solid: Impaired Oral Phase Impairments: Impaired mastication Oral Phase Functional Implications: Prolonged oral transit Pharyngeal Phase Impairments: Suspected delayed Swallow;Cough - Immediate;Cough - Delayed Other Comments: Patient exhibited some mild intensity of coughing when eating graham crackers and taking sips of water.      [name] 09/16/2018,5:39 PM   Sonia Baller, MA, CCC-SLP Speech Therapy [facility] Acute Rehab Pager: [phone]

## 2018-09-17 ENCOUNTER — Inpatient Hospital Stay (HOSPITAL_COMMUNITY): Payer: Medicare Other

## 2018-09-17 DIAGNOSIS — R4702 Dysphasia: Secondary | ICD-10-CM

## 2018-09-17 LAB — CREATININE, SERUM
Creatinine, Ser: 1.43 mg/dL — ABNORMAL HIGH (ref 0.61–1.24)
GFR calc Af Amer: 50 mL/min — ABNORMAL LOW (ref >60)
GFR calc non Af Amer: 43 mL/min — ABNORMAL LOW (ref >60)

## 2018-09-17 LAB — GLUCOSE, CAPILLARY
Glucose-Capillary: 182 mg/dL — ABNORMAL HIGH (ref 70–99)
Glucose-Capillary: 159 mg/dL — ABNORMAL HIGH (ref 70–99)
Glucose-Capillary: 141 mg/dL — ABNORMAL HIGH (ref 70–99)
Glucose-Capillary: 126 mg/dL — ABNORMAL HIGH (ref 70–99)

## 2018-09-17 MED ORDER — SODIUM CHLORIDE 0.9 % IV SOLN
100.0000 mg | Freq: Two times a day (BID) | INTRAVENOUS | Status: DC
  Administered 2018-09-17 (×2): 100 mg via INTRAVENOUS
  Filled 2018-09-17 (×4): qty 100

## 2018-09-17 MED ORDER — PREDNISONE 20 MG PO TABS
40.0000 mg | ORAL_TABLET | Freq: Every day | ORAL | Status: DC
  Administered 2018-09-18: 40 mg via ORAL
  Filled 2018-09-17: qty 2

## 2018-09-17 MED ORDER — INSULIN GLARGINE 100 UNIT/ML ~~LOC~~ SOLN
10.0000 [IU] | Freq: Every day | SUBCUTANEOUS | Status: DC
  Administered 2018-09-17: 10 [IU] via SUBCUTANEOUS
  Filled 2018-09-17 (×2): qty 0.1

## 2018-09-17 NOTE — Discharge Instructions (Signed)

## 2018-09-17 NOTE — Progress Notes (Signed)
Pt refusing to eat; pt insists that his children are in jail and want them to be brought to him so that they can eat. Pt severely agitated when presented with medication, states that he is refusing everything until his children are out of jail. Unable to be reoriented to situation/place/time. Pt very argumentative. Resting calmly at this time.

## 2018-09-17 NOTE — Progress Notes (Signed)
Progress Note    [name]  ZOX:[id number] DOB: 05/27/1927  DOA: 09/15/2018 PCP: Bing NeighborsHarris, Kimberly S, FNP    Brief Narrative:   Chief complaint: F/U AMS/HCAP.  Medical records reviewed and are as summarized below:  [name] is an 85 y.o. male with a PMH of atrial fibrillation, COPD, CVA, diabetes, hypertension, and hearing impairment who was admitted 09/15/2018 for evaluation of AMS associated with confusion and slurred speech x2 hours, but was back to baseline upon evaluation in the ED.  In the ED, he was mildly hypoxic with a oxygen saturation of 89% on room air.  BNP was 724 and lactic acid was 1.6.  X-ray in the ED showed pulmonary infiltrates and he was placed on antibiotics and diuretics.  Assessment/Plan:   Principal Problem:   HCAP (healthcare-associated pneumonia) associated with pulmonary edema concerning for acute diastolic CHF as well as acute encephalopathy Patient is noted to have 118 points on PSI score, and is class IV with 8.2-9.3% mortality risk.  Chest x-ray showed bilateral interstitial infiltrates with a possible right pleural effusion, flattened diaphragms consistent with emphysema.  He was placed on cefepime and vancomycin to cover healthcare associated pathogens given hospitalization for evaluation of chest pain 07/31/2018-08/01/2018, and subsequent discharge to SNF.  Had only been home for 2 days before presenting back to the hospital.  Patient noted to be on Lasix at baseline but had not been taking it which may have contributed to pulmonary edema as well.  On Lasix 40 mg IV every 12 hours.  HCTZ on hold while on IV Lasix.  2D echo done 07/10/18 showed an EF of greater than 65%, no reason to repeat at present.  Continues to require a Recruitment consultantsafety sitter given his acute encephalopathy (CT brain negative for acute findings).  No leukocytosis or lactic acidosis and strep pneumonia antigen negative.  COVID negative.  Follow-up blood and sputum cultures.  Speech therapy  evaluation done 09/16/2018.  Noted to have mild oral pharyngeal dysphasia.  Placed on a dysphagia 3 diet. Repeat CXR personally reviewed and shows some clearing but underlying interstitial lung disease or edema and lingular atelectasis vs. Pneumonia. Will narrow antibiotics to IV doxycycline (Azithro contraindicated due to underlying prolonged QTc). Remains encephalopathic, but more alert.  Keep on IV due to concerns that he will refuse orals due to paranoia. Paranoid, agitated, disoriented. Wants to go home because he thinks someone is stealing his money. Continue Recruitment consultantsafety sitter. PT evaluation.    Active Problems:   COPD exacerbation (HCC) Given 125 mg of Solu-Medrol on admission, currently on prednisone 40 mg BID, change to daily.  Continue duo nebs every 6 hours as needed.  Oxygen saturations 92-97%.    Diabetes mellitus type II, uncontrolled (HCC) Currently being managed with metformin and insulin sensitive SSI before meals and at bedtime.  CBG [id number].  Monitor closely.  We will add 10 units of Lantus.    Hypothyroidism Continue Synthroid.    HTN (hypertension) Continue Norvasc, lisinopril, metoprolol and Lasix.  Blood pressure controlled.    Atrial fibrillation (HCC) Continue Eliquis.  Heart rate controlled on metoprolol and Norvasc.    CAD (coronary artery disease) Continue beta-blocker, statin, and nitroglycerin as needed.     Hyperlipidemia Continue Lipitor.    Glaucoma Continue Alphagan.  Body mass index is 24.34 kg/m.   Family Communication/Anticipated D/C date and plan/Code Status   DVT prophylaxis: Eliquis ordered. Code Status: DNR.  Family Communication: Son updated by telephone. Disposition Plan: Unclear.  May need placement. Awaiting PT evaluation. If encephalopathy improved, may be able to be discharged 09/18/18.   Medical Consultants:    None.   Anti-Infectives:    Vancomycin 09/15/2018--->09/17/18  Cefepime 09/15/2018--->09/17/18  Doxycycline  09/17/18--->  Subjective:   [name] is more alert today, but remains encephalopathic, paranoid, agitated, requiring safety sitter. Denies SOB despite noted wheezing on physical exam.  Insisting he wants to leave because someone is "stealing his money".   Objective:    Vitals:   09/16/18 1650 09/16/18 2020 09/16/18 2053 09/17/18 0747  BP: (!) 134/47  (!) 130/56 (!) 143/58  Pulse: 80  75 66  Resp: (!) 22   16  Temp: 98.8 F (37.1 C)  98.6 F (37 C) 98.5 F (36.9 C)  TempSrc: Oral  Oral Oral  SpO2: 92% 94% 97% 94%  Weight:      Height:        Intake/Output Summary (Last 24 hours) at 09/17/2018 1019 Last data filed at 09/17/2018 0601 Gross per 24 hour  Intake --  Output 1775 ml  Net -1775 ml   Filed Weights   09/15/18 1429  Weight: 72.6 kg    Exam: General: Agitated. Cardiovascular: Heart sounds show a regular rate, and rhythm. No gallops or rubs. No murmurs. No JVD. Lungs: Course with bilateral expiratory wheezing. Abdomen: Soft, nontender, nondistended with normal active bowel sounds. No masses. No hepatosplenomegaly. Neurological: Alert and oriented to place and self, but thinks it is September 24, 1927. Skin: Warm and dry. Scattered ecchymosis. Extremities: No clubbing or cyanosis. No edema. Pedal pulses 2+. Psychiatric: Mood and affect are anxious. Insight and judgment are impaired.  Data Reviewed:   I have personally reviewed following labs and imaging studies:  Labs: Labs show the following:   Basic Metabolic Panel: Recent Labs  Lab 09/15/18 1439 09/15/18 1534 09/17/18 0812  NA 143 144  --   K 3.8 3.8  --   CL 108  --   --   CO2 26  --   --   GLUCOSE 145*  --   --   BUN 13  --   --   CREATININE 1.08  --  1.43*  CALCIUM 9.3  --   --    GFR Estimated Creatinine Clearance: 34.5 mL/min (A) (by C-G formula based on SCr of 1.43 mg/dL (H)). Liver Function Tests: Recent Labs  Lab 09/15/18 1439  AST 19  ALT 16  ALKPHOS 54  BILITOT 0.8  PROT 6.2*   ALBUMIN 3.5    CBC: Recent Labs  Lab 09/15/18 1439 09/15/18 1534 09/16/18 0608  WBC 5.3  --  5.6  NEUTROABS 3.3  --   --   HGB 12.1* 12.9* 12.5*  HCT 39.1 38.0* 39.7  MCV 91.4  --  89.4  PLT 171  --  173   CBG: Recent Labs  Lab 09/16/18 0840 09/16/18 1203 09/16/18 1654 09/16/18 2056 09/17/18 0744  GLUCAP 168* 165* 185* 238* 182*   Sepsis Labs: Recent Labs  Lab 09/15/18 1439 09/15/18 1611 09/16/18 0608  WBC 5.3  --  5.6  LATICACIDVEN 1.6 0.9  --     Microbiology Recent Results (from the past 240 hour(s))  SARS Coronavirus 2 (CEPHEID- Performed in [facility] hospital lab), Hosp Order     Status: None   Collection Time: 09/15/18  2:51 PM   Specimen: Nasopharyngeal Swab  Result Value Ref Range Status   SARS Coronavirus 2 NEGATIVE NEGATIVE Final    Comment: (NOTE) If  result is NEGATIVE SARS-CoV-2 target nucleic acids are NOT DETECTED. The SARS-CoV-2 RNA is generally detectable in upper and lower  respiratory specimens during the acute phase of infection. The lowest  concentration of SARS-CoV-2 viral copies this assay can detect is 250  copies / mL. A negative result does not preclude SARS-CoV-2 infection  and should not be used as the sole basis for treatment or other  patient management decisions.  A negative result may occur with  improper specimen collection / handling, submission of specimen other  than nasopharyngeal swab, presence of viral mutation(s) within the  areas targeted by this assay, and inadequate number of viral copies  (<250 copies / mL). A negative result must be combined with clinical  observations, patient history, and epidemiological information. If result is POSITIVE SARS-CoV-2 target nucleic acids are DETECTED. The SARS-CoV-2 RNA is generally detectable in upper and lower  respiratory specimens dur ing the acute phase of infection.  Positive  results are indicative of active infection with SARS-CoV-2.  Clinical  correlation with  patient history and other diagnostic information is  necessary to determine patient infection status.  Positive results do  not rule out bacterial infection or co-infection with other viruses. If result is PRESUMPTIVE POSTIVE SARS-CoV-2 nucleic acids MAY BE PRESENT.   A presumptive positive result was obtained on the submitted specimen  and confirmed on repeat testing.  While 2019 novel coronavirus  (SARS-CoV-2) nucleic acids may be present in the submitted sample  additional confirmatory testing may be necessary for epidemiological  and / or clinical management purposes  to differentiate between  SARS-CoV-2 and other Sarbecovirus currently known to infect humans.  If clinically indicated additional testing with an alternate test  methodology [phone]([id number]) is advised. The SARS-CoV-2 RNA is generally  detectable in upper and lower respiratory sp ecimens during the acute  phase of infection. The expected result is Negative. Fact Sheet for Patients:  BoilerBrush.com.cyhttps://www.fda.gov/media/136312/download Fact Sheet for Healthcare Providers: https://pope.com/https://www.fda.gov/media/136313/download This test is not yet approved or cleared by the [place] FDA and has been authorized for detection and/or diagnosis of SARS-CoV-2 by FDA under an Emergency Use Authorization (EUA).  This EUA will remain in effect (meaning this test can be used) for the duration of the COVID-19 declaration under Section 564(b)(1) of the Act, 21 U.S.C. section 360bbb-3(b)(1), unless the authorization is terminated or revoked sooner. Performed at [facility], [address] [place], [place] [address]   Blood Culture (routine x 2)     Status: None (Preliminary result)   Collection Time: 09/15/18  2:52 PM   Specimen: BLOOD  Result Value Ref Range Status   Specimen Description BLOOD LEFT ANTECUBITAL  Final   Special Requests   Final    BOTTLES DRAWN AEROBIC AND ANAEROBIC Blood Culture results may not be optimal due to an inadequate  volume of blood received in culture bottles   Culture   Final    NO GROWTH 2 DAYS Performed at [facility], [address] [place], [place] [address]    Report Status PENDING  Incomplete  Blood Culture (routine x 2)     Status: None (Preliminary result)   Collection Time: 09/15/18  2:58 PM   Specimen: BLOOD RIGHT HAND  Result Value Ref Range Status   Specimen Description BLOOD RIGHT HAND  Final   Special Requests   Final    BOTTLES DRAWN AEROBIC AND ANAEROBIC Blood Culture results may not be optimal due to an inadequate volume of blood  received in culture bottles   Culture   Final    NO GROWTH 2 DAYS Performed at [facility], [address] [place], [place] [address]    Report Status PENDING  Incomplete  Urine culture     Status: None   Collection Time: 09/15/18  4:14 PM   Specimen: Urine, Clean Catch  Result Value Ref Range Status   Specimen Description URINE, CLEAN CATCH  Final   Special Requests NONE  Final   Culture   Final    NO GROWTH Performed at [facility], [address] [place], [place] [address]    Report Status 09/16/2018 FINAL  Final    Procedures and diagnostic studies:  Ct Head Wo Contrast  Result Date: 09/15/2018 CLINICAL DATA:  Slurred speech, headache and generalized weakness. History of stroke. EXAM: CT HEAD WITHOUT CONTRAST TECHNIQUE: Contiguous axial images were obtained from the base of the skull through the vertex without intravenous contrast. COMPARISON:  07/14/2018 FINDINGS: Brain: No evidence of acute infarction, hemorrhage, or hydrocephalus. Stable large area of encephalomalacia from previous left MCA territory infarct. Lacunar infarct in the left pons is also stable. Brain parenchymal volume loss and microangiopathy. Vascular: Calcific atherosclerotic disease of the intra cavernous carotid arteries. Skull: Normal. Negative for fracture or focal lesion. Sinuses/Orbits: Polypoid mucosal thickening of the ethmoid sinuses. The  remainder of the paranasal sinuses and mastoid air cells are well aerated. Other: None. IMPRESSION: 1. No acute intracranial abnormality. 2. Stable large area of encephalomalacia from previous left MCA territory infarct. 3. Stable lacunar infarct in the left pons. 4. Atrophy, chronic microvascular disease. Electronically Signed   By: Ted Mcalpine M.D.   On: 09/15/2018 16:03   Dg Chest Port 1 View  Result Date: 09/17/2018 CLINICAL DATA:  CHF EXAM: PORTABLE CHEST 1 VIEW COMPARISON:  09/15/2018, 05/20/2015 FINDINGS: Bilateral mild interstitial thickening. Lingular airspace disease new compared with the prior exam. No pleural effusion or pneumothorax. Stable cardiomediastinal silhouette. No aggressive osseous lesion. IMPRESSION: Stable bilateral mild interstitial thickening likely reflecting chronic interstitial lung disease. An element of superimposed mild interstitial edema is not excluded. New area of airspace disease in the lingula concerning for atelectasis versus pneumonia. Electronically Signed   By: Elige Ko   On: 09/17/2018 09:29   Dg Chest Port 1 View  Result Date: 09/15/2018 CLINICAL DATA:  Shortness of breath EXAM: PORTABLE CHEST 1 VIEW COMPARISON:  08/27/2018, 07/31/2018 07/19/2018, 02/01/2018, CT 02/02/2018 FINDINGS: Emphysematous disease. Diffuse bilateral interstitial opacity some of which is felt secondary to chronic change. Overall interstitial opacities appear increased as compared with prior, suspect for acute superimposed interstitial edema or inflammatory process. Stable cardiomediastinal silhouette. No pneumothorax. Possible tiny right effusion. IMPRESSION: 1. Diffusely increased bilateral interstitial opacity, suspicious for acute interstitial edema or inflammatory process superimposed on underlying chronic interstitial disease. Possible tiny right effusion 2. Emphysematous disease Electronically Signed   By: Jasmine Pang M.D.   On: 09/15/2018 15:05    Medications:     amLODipine  5 mg Oral Daily   apixaban  5 mg Oral BID   atorvastatin  80 mg Oral q1800   brimonidine  1 drop Both Eyes BID   finasteride  5 mg Oral Daily   furosemide  40 mg Intravenous Q12H   hydrocerin  1 application Topical BID   insulin aspart  0-5 Units Subcutaneous QHS   insulin aspart  0-9 Units Subcutaneous TID WC   insulin glargine  10 Units Subcutaneous Daily   levothyroxine  50 mcg Oral QAC breakfast   lisinopril  20 mg Oral Daily   metFORMIN  500 mg Oral BID WC   metoprolol tartrate  25 mg Oral BID   mometasone-formoterol  2 puff Inhalation BID   multivitamin with minerals  1 tablet Oral Daily   olopatadine  1 drop Both Eyes BID   pantoprazole  40 mg Oral Daily   polyvinyl alcohol  1 drop Both Eyes TID AC & HS   [START ON 09/18/2018] predniSONE  40 mg Oral Q breakfast   senna  2 tablet Oral QHS   tamsulosin  0.4 mg Oral Daily   Continuous Infusions:  doxycycline (VIBRAMYCIN) IV       LOS: 2 days   Jacquelynn Cree  [facility] Pager [phone].   *Please refer to [email or web address], password [email or web address] to get updated schedule on who will round on this patient, as hospitalists switch teams weekly. If 7PM-7AM, please contact night-coverage at [email or web address], password [email or web address] for any overnight needs.  09/17/2018, 10:19 AM

## 2018-09-17 NOTE — Evaluation (Signed)
Physical Therapy Evaluation Patient Details Name: [name] MRN: [id number] DOB: 09/06/1927 Today's Date: 09/17/2018   History of Present Illness  [name] is an 85 y.o. male with a PMH of atrial fibrillation, COPD, CVA, diabetes, hypertension, and hearing impairment who was admitted 09/15/2018 for evaluation of AMS associated with confusion and slurred speech x2 hours, but was back to baseline upon evaluation in the ED.    Clinical Impression  Pt admitted with above diagnosis. Pt currently with functional limitations due to the deficits listed below (see PT Problem List). Pt unreliable historian, per prior chart from home with family. Today, ambulating with contact guard with poor safety awareness. If family is avail for 24/7 support may be progress HHPT, if this cannot be confirmed will need SNF placement.  Pt will benefit from skilled PT to increase their independence and safety with mobility to allow discharge to the venue listed below.       Follow Up Recommendations Home health PT;Supervision/Assistance - 24 hour(if 24/7 supervision not avail- then will need SNF. )    Equipment Recommendations  None recommended by PT    Recommendations for Other Services       Precautions / Restrictions Precautions Precautions: Fall      Mobility  Bed Mobility Overal bed mobility: Needs Assistance Bed Mobility: Supine to Sit     Supine to sit: Supervision        Transfers Overall transfer level: Needs assistance Equipment used: Rolling walker (2 wheeled) Transfers: Sit to/from Stand Sit to Stand: Min guard            Ambulation/Gait Ambulation/Gait assistance: Min guard Gait Distance (Feet): 20 Feet Assistive device: Rolling walker (2 wheeled) Gait Pattern/deviations: Step-to pattern Gait velocity: decreased   General Gait Details: impulsive with poor safety awareness/line mgt. would benefit from assist at home. de sat on RA to 80, returns quickly to  96%  Stairs            Wheelchair Mobility    Modified Rankin (Stroke Patients Only)       Balance                                             Pertinent Vitals/Pain Pain Assessment: No/denies pain    Home Living Family/patient expects to be discharged to:: Private residence Living Arrangements: Children(son, grandchildren per chart) Available Help at Discharge: Family Type of Home: House Home Access: Level entry     Home Layout: One level Home Equipment: Environmental consultantWalker - 2 wheels;Bedside commode;Shower seat      Prior Function Level of Independence: Needs assistance   Gait / Transfers Assistance Needed: Unsure; unreliable historian           Hand Dominance        Extremity/Trunk Assessment   Upper Extremity Assessment Upper Extremity Assessment: Generalized weakness    Lower Extremity Assessment Lower Extremity Assessment: Generalized weakness       Communication      Cognition Arousal/Alertness: Awake/alert   Overall Cognitive Status: No family/caregiver present to determine baseline cognitive functioning                                 General Comments: following cues, intitally stated he lives with his parents      General Comments  Exercises     Assessment/Plan    PT Assessment Patient needs continued PT services  PT Problem List Decreased strength       PT Treatment Interventions DME instruction;Gait training;Functional mobility training;Therapeutic activities;Therapeutic exercise    PT Goals (Current goals can be found in the Care Plan section)  Acute Rehab PT Goals Patient Stated Goal: non stated PT Goal Formulation: With patient Time For Goal Achievement: 10/01/18 Potential to Achieve Goals: Fair    Frequency Min 3X/week   Barriers to discharge Inaccessible home environment      Co-evaluation               AM-PAC PT "6 Clicks" Mobility  Outcome Measure Help needed turning  from your back to your side while in a flat bed without using bedrails?: A Little Help needed moving from lying on your back to sitting on the side of a flat bed without using bedrails?: A Little Help needed moving to and from a bed to a chair (including a wheelchair)?: A Little Help needed standing up from a chair using your arms (e.g., wheelchair or bedside chair)?: A Little Help needed to walk in hospital room?: A Little Help needed climbing 3-5 steps with a railing? : A Little 6 Click Score: 18    End of Session Equipment Utilized During Treatment: Gait belt Activity Tolerance: Patient tolerated treatment well Patient left: in bed Nurse Communication: Mobility status PT Visit Diagnosis: Unsteadiness on feet (R26.81)    Time: 0174-9449 PT Time Calculation (min) (ACUTE ONLY): 26 min   Charges:   PT Evaluation $PT Eval Moderate Complexity: 1 Mod PT Treatments $Gait Training: 8-22 mins        Reinaldo Berber, PT, DPT Acute Rehabilitation Services Pager: [phone] Office: [place] 09/17/2018, 2:49 PM

## 2018-09-17 NOTE — Progress Notes (Signed)
SLP Cancellation Note  Patient Details Name: [name] MRN: [id number] DOB: 11-Jul-1927   Cancelled treatment:       Reason Eval/Treat Not Completed: Patient declined, no reason specified. Per sitter, patient is refusing to eat. Patient is very agitated and confused, thinking that two men came and stole from him. He says "I'll eat when I'm home." Suspect that patient will continue to become more and more agitated the longer he is in the hospital.   Dannial Monarch 09/17/2018, 3:11 PM    Sonia Baller, MA, [place] [facility] Pager: [phone]

## 2018-09-18 DIAGNOSIS — I5033 Acute on chronic diastolic (congestive) heart failure: Secondary | ICD-10-CM

## 2018-09-18 DIAGNOSIS — R4702 Dysphasia: Secondary | ICD-10-CM

## 2018-09-18 DIAGNOSIS — I2510 Atherosclerotic heart disease of native coronary artery without angina pectoris: Secondary | ICD-10-CM

## 2018-09-18 LAB — GLUCOSE, CAPILLARY
Glucose-Capillary: 79 mg/dL (ref 70–99)
Glucose-Capillary: 77 mg/dL (ref 70–99)
Glucose-Capillary: 194 mg/dL — ABNORMAL HIGH (ref 70–99)

## 2018-09-18 MED ORDER — APIXABAN 5 MG PO TABS: 5.0000 mg | ORAL_TABLET | Freq: Two times a day (BID) | ORAL | 3 refills | Status: DC

## 2018-09-18 MED ORDER — DOXYCYCLINE MONOHYDRATE 100 MG PO TABS: 100.0000 mg | ORAL_TABLET | Freq: Two times a day (BID) | ORAL | 0 refills | Status: AC

## 2018-09-18 MED ORDER — FUROSEMIDE 20 MG PO TABS: 20.0000 mg | ORAL_TABLET | Freq: Every day | ORAL | 2 refills | Status: DC

## 2018-09-18 NOTE — TOC Transition Note (Signed)
Transition of Care [facility]) - CM/SW Discharge Note   Patient Details  Name: [name] MRN: [id number] Date of Birth: 06/27/1927  [facility]) CM/SW Contact:  Zenon Mayo, RN Phone Number: 09/18/2018, 10:50 AM   Clinical Narrative:     For dc today with HHRN, HHPT, with [place], Butch Penny notified. Son will pick up around 4 pm today.   Final next level of care: [place] Barriers to Discharge: No Barriers Identified   Patient Goals and CMS Choice Patient states their goals for this hospitalization and ongoing recovery are:: to get back to where he was before CMS Medicare.gov Compare Post Acute Care list provided to:: Patient Represenative (must comment)(son) Choice offered to / list presented to : Adult Children  Discharge Placement                       Discharge Plan and Services   Discharge Planning Services: CM Consult Post Acute Care Choice: Home Health, Resumption of Svcs/PTA Provider          DME Arranged: (NA)         HH Arranged: RN, PT HH Agency: [facility]) Date [place]: 09/18/18 Time [place]: 1050 Representative spoke with at [facility]: [place] Interventions     Readmission Risk Interventions Readmission Risk Prevention Plan 09/18/2018 08/01/2018  Transportation Screening Complete -  Medication Review Press photographer) Complete -  PCP or Specialist appointment within 3-5 days of discharge Complete Complete  HRI or Home Care Consult Complete (No Data)  SW Recovery Care/Counseling Consult Complete Complete  Palliative Care Screening Not Applicable Not Applicable  Skilled Nursing Facility Not Applicable Complete  Some recent data might be hidden

## 2018-09-18 NOTE — TOC Initial Note (Signed)
Transition of Care [facility](TOC) - Initial/Assessment Note    Patient Details  Name: [name] MRN: [id number] Date of Birth: 09/21/1927  Transition of Care [facility](TOC) CM/SW Contact:    Leone Havenaylor, Parrish Daddario Clinton, RN Phone Number: 09/18/2018, 10:47 AM  Clinical Narrative:                 From home with son and his wife.  Son will transport him at dc.  Patient states NCM can talk to his son.  NCM contacted son, [facility], [facility], this was [facility] and she was notified of dc today.  Expected Discharge Plan: Home w Home Health Services Barriers to Discharge: No Barriers Identified   Patient Goals and CMS Choice Patient states their goals for this hospitalization and ongoing recovery are:: to get back to where he was before CMS Medicare.gov Compare Post Acute Care list provided to:: Patient Represenative (must comment)(son) Choice offered to / list presented to : Adult Children  Expected Discharge Plan and Services Expected Discharge Plan: Home w Home Health Services   Discharge Planning Services: CM Consult Post Acute Care Choice: Home Health, Resumption of Svcs/PTA Provider Living arrangements for the past 2 months: Mobile Home Expected Discharge Date: 09/18/18               DME Arranged: (NA)         HH Arranged: RN, PT HH Agency: [facility] Date HH Agency Contacted: 09/18/18 Time HH Agency Contacted: 1045 [facility] Lupita Leashonna  Prior Living Arrangements/Services Living arrangements for the past 2 months: Mobile Home Lives with:: Adult Children Patient language and need for interpreter reviewed:: Yes Do you feel safe going back to the place where you live?: Yes      Need for Family Participation in Patient Care: Yes (Comment) Care giver support system in place?: Yes (comment) Current home services: Home PT, Home RN Criminal Activity/Legal Involvement  Pertinent to Current Situation/Hospitalization: No - Comment as needed  Activities of Daily Living Home Assistive Devices/Equipment: Blood pressure cuff, Cane (specify quad or straight), CBG Meter, Walker (specify type), Other (Comment)(nebulizer machine) ADL Screening (condition at time of admission) Patient's cognitive ability adequate to safely complete daily activities?: No Is the patient deaf or have difficulty hearing?: No Does the patient have difficulty seeing, even when wearing glasses/contacts?: No Does the patient have difficulty concentrating, remembering, or making decisions?: Yes Patient able to express need for assistance with ADLs?: Yes Does the patient have difficulty dressing or bathing?: Yes Independently performs ADLs?: No Communication: Needs assistance Is this a change from baseline?: Change from baseline, expected to last <3 days Dressing (OT): Dependent Is this a change from baseline?: Change from baseline, expected to last <3days Grooming: Dependent Is this a change from baseline?: Change from baseline, expected to last <3 days Feeding: Independent Bathing: Dependent Is this a change from baseline?: Change from baseline, expected to last <3 days Toileting: Dependent Is this a change from baseline?: Change from baseline, expected to last <3 days In/Out Bed: Dependent Is this a change from baseline?: Change from baseline, expected to last <3 days Walks in Home: Needs assistance Is this a change from baseline?: Change from baseline, expected to last <3 days Does the patient have difficulty walking or climbing stairs?: Yes Weakness of Legs: Both Weakness of Arms/Hands: None  Permission Sought/Granted   Permission granted to share information with :  Yes, Verbal Permission Granted  Share Information with NAME: son           Emotional Assessment Appearance:: Appears stated age Attitude/Demeanor/Rapport: Gracious Affect (typically observed):  Appropriate Orientation: : Fluctuating Orientation (Suspected and/or reported Sundowners) Alcohol / Substance Use: Not Applicable Psych Involvement: No (comment)  Admission diagnosis:  ams and weakness Patient Active Problem List   Diagnosis Date Noted  . Dysphasia 09/17/2018  . HCAP (healthcare-associated pneumonia) 09/15/2018  . Acute CHF (congestive heart failure) ([place]) 09/15/2018  . Enterococcus UTI 08/07/2018  . Neurocognitive deficits 08/07/2018  . Urinary tract infection with hematuria   . Malnutrition of moderate degree 07/31/2018  . AMS (altered mental status) 07/14/2018  . Altered mental status   . Stroke ([place]) 07/10/2018  . Requires supplemental oxygen 06/05/2018  . Chest pain 03/29/2018  . Atrial fibrillation ([place]) 05/20/2015  . RBBB 05/20/2015  . Prolonged Q-T interval on ECG 05/20/2015  . CAD (coronary artery disease) 05/20/2015  . History of CVA (cerebrovascular accident) 05/20/2015  . Atrial fibrillation with RVR ([place])   . Bronchiectasis with acute exacerbation ([place])   . Unstable angina ([place]) 03/27/2013  . COPD exacerbation ([place]) 03/25/2013  . Diabetes mellitus type II, uncontrolled ([place]) 03/25/2013  . Hypothyroidism 03/25/2013  . BPH (benign prostatic hyperplasia) 03/25/2013  . HTN (hypertension) 03/25/2013   PCP:  Scot Jun, FNP Pharmacy:   [facility] [phone] [place], [place] [address] [place] [address] Phone: [phone] Fax: [place], [place] - [address] [place] [phone] [place] [address] Phone: [phone] Fax: [phone]     Social Determinants of Health (SDOH) Interventions    Readmission Risk Interventions Readmission Risk Prevention Plan 09/18/2018 08/01/2018  Transportation Screening Complete -  Medication Review (RN Care Manager) Complete -  PCP or  Specialist appointment within 3-5 days of discharge Complete Complete  HRI or Home Care Consult Complete (No Data)  SW Recovery Care/Counseling Consult Complete Complete  Palliative Care Screening Not Applicable Not Applicable  Skilled Nursing Facility Not Applicable Complete  Some recent data might be hidden

## 2018-09-18 NOTE — Progress Notes (Signed)
Pills cut up & put in applesauce; pt spit out.

## 2018-09-18 NOTE — Discharge Summary (Signed)
Physician Discharge Summary  [name] ZOX:[id number] DOB: 08/31/1927 DOA: 09/15/2018  PCP: Bing NeighborsHarris, Kimberly S, FNP  Admit date: 09/15/2018 Discharge date: 09/18/2018  Admitted From: Home Discharge disposition: Home   Recommendations for Outpatient Follow-Up:   1. Home health PT and RN set up. 2. Will need close F/U of diabetes control.   Discharge Diagnosis:   Principal Problem:   HCAP (healthcare-associated pneumonia) Active Problems:   COPD exacerbation (HCC)   Diabetes mellitus type II, uncontrolled without long term use of insulin with circulatory complications (HCC)   Hypothyroidism   HTN (hypertension)   Atrial fibrillation (HCC)   CAD (coronary artery disease)   Altered mental status   Acute CHF (congestive heart failure) (HCC)   Dysphasia    Discharge Condition: Improved.  Diet recommendation: Low sodium, heart healthy.  Carbohydrate-modified.    Code status: DNR.   History of Present Illness:   [name] is an 85 y.o. male with a PMH of atrial fibrillation, COPD, CVA, diabetes, hypertension, and hearing impairment who was admitted 09/15/2018 for evaluation of AMS associated with confusion and slurred speech x2 hours, but was back to baseline upon evaluation in the ED.  In the ED, he was mildly hypoxic with a oxygen saturation of 89% on room air.  BNP was 724 and lactic acid was 1.6.  X-ray in the ED showed pulmonary infiltrates and he was placed on antibiotics and diuretics.  Hospital Course by Problem:   Principal Problem:   HCAP (healthcare-associated pneumonia) associated with pulmonary edema concerning for acute diastolic CHF as well as acute encephalopathy Patient was noted to have 118 points on PSI score (class IV with 8.2-9.3% mortality risk).  Initial chest x-ray showed bilateral interstitial infiltrates with a possible right pleural effusion, flattened diaphragms consistent with emphysema.  He was placed on cefepime and vancomycin to  cover healthcare associated pathogens given hospitalization for evaluation of chest pain 07/31/2018-08/01/2018, and subsequent discharge to SNF.  Had only been home for 2 days before presenting back to the hospital.  Patient noted to be on Lasix x 3 days only at baseline so element of pulmonary edema felt to be contributory.  Placed on Lasix 40 mg IV every 12 hours.  HCTZ held.  2D echo done 07/10/18 showed an EF of greater than 65%, and was not repeated.  Required a Recruitment consultantsafety sitter given his acute encephalopathy (CT brain negative for acute findings).  No leukocytosis or lactic acidosis and strep pneumonia antigen, COVID and blood cultures negatve.  Speech therapy evaluation done 09/16/2018.  Noted to have mild oral pharyngeal dysphasia.  Placed on a dysphagia 3 diet. Repeat CXR done 09/17/18 and showed some clearing but underlying interstitial lung disease or edema and lingular atelectasis vs. Pneumonia persisted. Antibiotics subsequently narrowed to IV doxycycline (Azithro contraindicated due to underlying prolonged QTc) as patient often refused oral medications. Remains encephalopathic with persistent delirium and paranoid, with likely exacerbation of underlying dementia due to hospitalization.  Discussed need for 24 hour supervision with son.  Calm at present, and stable for d/c with close follow up.    Active Problems:   COPD exacerbation (HCC) Given 125 mg of Solu-Medrol on admission, followed by 40 mg oral prednisone and duo nebs every 6 hours as needed.  No need for further prednisone at discharge.    Diabetes mellitus type II, uncontrolled (HCC) Managed with metformin and insulin sensitive SSI before meals and at bedtime and 10 units of Lantus. Will need close outpatient follow up  of glycemic control.  Steroids may have caused the hyperglycemia observed in the hospital setting.    Hypothyroidism Continue Synthroid.    HTN (hypertension) Continue Norvasc, lisinopril, metoprolol and Lasix.  Blood  pressure controlled. (HCTZ d/c'd and maintained on lasix at d/c).    Atrial fibrillation (HCC) Continue Eliquis.  Heart rate controlled on metoprolol and Norvasc.    CAD (coronary artery disease) Continue beta-blocker, statin, and nitroglycerin as needed.     Hyperlipidemia Continue Lipitor.    Glaucoma Continue Alphagan.    Medical Consultants:    None.   Discharge Exam:   Vitals:   09/18/18 0756 09/18/18 0927  BP:  (!) 148/58  Pulse:  76  Resp:  17  Temp:  98.5 F (36.9 C)  SpO2: 94% 92%   Vitals:   09/17/18 2029 09/18/18 0713 09/18/18 0756 09/18/18 0927  BP: (!) 151/62 (!) 142/61  (!) 148/58  Pulse: 71 62  76  Resp: 18 17  17   Temp: 98.7 F (37.1 C) 98.2 F (36.8 C)  98.5 F (36.9 C)  TempSrc: Oral Oral    SpO2: 93% 91% 94% 92%  Weight:      Height:        General exam: Calm and confused. Respiratory system: Occasional rhonchi. Respiratory effort normal. Cardiovascular system: S1 & S2 heard, RRR. No JVD,  rubs, gallops or clicks. No murmurs. Gastrointestinal system: Abdomen is nondistended, soft and nontender. No organomegaly or masses felt. Normal bowel sounds heard. Central nervous system: Alert and oriented. No focal neurological deficits. Extremities: No clubbing,  or cyanosis. No edema. Skin: No rashes, lesions or ulcers. Psychiatry: Judgement and insight appear normal. Mood & affect appropriate.    The results of significant diagnostics from this hospitalization (including imaging, microbiology, ancillary and laboratory) are listed below for reference.     Procedures and Diagnostic Studies:   Ct Head Wo Contrast  Result Date: 09/15/2018 CLINICAL DATA:  Slurred speech, headache and generalized weakness. History of stroke. EXAM: CT HEAD WITHOUT CONTRAST TECHNIQUE: Contiguous axial images were obtained from the base of the skull through the vertex without intravenous contrast. COMPARISON:  07/14/2018 FINDINGS: Brain: No evidence of acute  infarction, hemorrhage, or hydrocephalus. Stable large area of encephalomalacia from previous left MCA territory infarct. Lacunar infarct in the left pons is also stable. Brain parenchymal volume loss and microangiopathy. Vascular: Calcific atherosclerotic disease of the intra cavernous carotid arteries. Skull: Normal. Negative for fracture or focal lesion. Sinuses/Orbits: Polypoid mucosal thickening of the ethmoid sinuses. The remainder of the paranasal sinuses and mastoid air cells are well aerated. Other: None. IMPRESSION: 1. No acute intracranial abnormality. 2. Stable large area of encephalomalacia from previous left MCA territory infarct. 3. Stable lacunar infarct in the left pons. 4. Atrophy, chronic microvascular disease. Electronically Signed   By: Ted Mcalpineobrinka  Dimitrova M.D.   On: 09/15/2018 16:03   Dg Chest Port 1 View  Result Date: 09/15/2018 CLINICAL DATA:  Shortness of breath EXAM: PORTABLE CHEST 1 VIEW COMPARISON:  08/27/2018, 07/31/2018 07/19/2018, 02/01/2018, CT 02/02/2018 FINDINGS: Emphysematous disease. Diffuse bilateral interstitial opacity some of which is felt secondary to chronic change. Overall interstitial opacities appear increased as compared with prior, suspect for acute superimposed interstitial edema or inflammatory process. Stable cardiomediastinal silhouette. No pneumothorax. Possible tiny right effusion. IMPRESSION: 1. Diffusely increased bilateral interstitial opacity, suspicious for acute interstitial edema or inflammatory process superimposed on underlying chronic interstitial disease. Possible tiny right effusion 2. Emphysematous disease Electronically Signed   By: Adrian ProwsKim  Fujinaga M.D.  On: 09/15/2018 15:05     Labs:   Basic Metabolic Panel: Recent Labs  Lab 09/15/18 1439 09/15/18 1534 09/17/18 0812  NA 143 144  --   K 3.8 3.8  --   CL 108  --   --   CO2 26  --   --   GLUCOSE 145*  --   --   BUN 13  --   --   CREATININE 1.08  --  1.43*  CALCIUM 9.3  --   --     GFR Estimated Creatinine Clearance: 34.5 mL/min (A) (by C-G formula based on SCr of 1.43 mg/dL (H)). Liver Function Tests: Recent Labs  Lab 09/15/18 1439  AST 19  ALT 16  ALKPHOS 54  BILITOT 0.8  PROT 6.2*  ALBUMIN 3.5   CBC: Recent Labs  Lab 09/15/18 1439 09/15/18 1534 09/16/18 0608  WBC 5.3  --  5.6  NEUTROABS 3.3  --   --   HGB 12.1* 12.9* 12.5*  HCT 39.1 38.0* 39.7  MCV 91.4  --  89.4  PLT 171  --  173   CBG: Recent Labs  Lab 09/17/18 0744 09/17/18 1203 09/17/18 1710 09/17/18 2204 09/18/18 0916  GLUCAP 182* 159* 141* 126* 77   Microbiology Recent Results (from the past 240 hour(s))  SARS Coronavirus 2 (CEPHEID- [facility]), Hosp Order     Status: None   Collection Time: 09/15/18  2:51 PM   Specimen: Nasopharyngeal Swab  Result Value Ref Range Status   SARS Coronavirus 2 NEGATIVE NEGATIVE Final    Comment: (NOTE) If result is NEGATIVE SARS-CoV-2 target nucleic acids are NOT DETECTED. The SARS-CoV-2 RNA is generally detectable in upper and lower  respiratory specimens during the acute phase of infection. The lowest  concentration of SARS-CoV-2 viral copies this assay can detect is 250  copies / mL. A negative result does not preclude SARS-CoV-2 infection  and should not be used as the sole basis for treatment or other  patient management decisions.  A negative result may occur with  improper specimen collection / handling, submission of specimen other  than nasopharyngeal swab, presence of viral mutation(s) within the  areas targeted by this assay, and inadequate number of viral copies  (<250 copies / mL). A negative result must be combined with clinical  observations, patient history, and epidemiological information. If result is POSITIVE SARS-CoV-2 target nucleic acids are DETECTED. The SARS-CoV-2 RNA is generally detectable in upper and lower  respiratory specimens dur ing the acute phase of infection.  Positive   results are indicative of active infection with SARS-CoV-2.  Clinical  correlation with patient history and other diagnostic information is  necessary to determine patient infection status.  Positive results do  not rule out bacterial infection or co-infection with other viruses. If result is PRESUMPTIVE POSTIVE SARS-CoV-2 nucleic acids MAY BE PRESENT.   A presumptive positive result was obtained on the submitted specimen  and confirmed on repeat testing.  While 2019 novel coronavirus  (SARS-CoV-2) nucleic acids may be present in the submitted sample  additional confirmatory testing may be necessary for epidemiological  and / or clinical management purposes  to differentiate between  SARS-CoV-2 and other Sarbecovirus currently known to infect humans.  If clinically indicated additional testing with an alternate test  methodology [phone]([id number]) is advised. The SARS-CoV-2 RNA is generally  detectable in upper and lower respiratory sp ecimens during the acute  phase of infection. The expected result is Negative. Fact  Sheet for Patients:  BoilerBrush.com.cy Fact Sheet for Healthcare Providers: https://pope.com/ This test is not yet approved or cleared by the [place] FDA and has been authorized for detection and/or diagnosis of SARS-CoV-2 by FDA under an Emergency Use Authorization (EUA).  This EUA will remain in effect (meaning this test can be used) for the duration of the COVID-19 declaration under Section 564(b)(1) of the Act, 21 U.S.C. section 360bbb-3(b)(1), unless the authorization is terminated or revoked sooner. Performed at [facility], [address] [place], [place] [address]   Blood Culture (routine x 2)     Status: None (Preliminary result)   Collection Time: 09/15/18  2:52 PM   Specimen: BLOOD  Result Value Ref Range Status   Specimen Description BLOOD LEFT ANTECUBITAL  Final   Special Requests   Final    BOTTLES  DRAWN AEROBIC AND ANAEROBIC Blood Culture results may not be optimal due to an inadequate volume of blood received in culture bottles   Culture   Final    NO GROWTH 2 DAYS Performed at [facility], [address] [place], [place] [address]    Report Status PENDING  Incomplete  Blood Culture (routine x 2)     Status: None (Preliminary result)   Collection Time: 09/15/18  2:58 PM   Specimen: BLOOD RIGHT HAND  Result Value Ref Range Status   Specimen Description BLOOD RIGHT HAND  Final   Special Requests   Final    BOTTLES DRAWN AEROBIC AND ANAEROBIC Blood Culture results may not be optimal due to an inadequate volume of blood received in culture bottles   Culture   Final    NO GROWTH 2 DAYS Performed at [facility], [address], [place], [place] [address]    Report Status PENDING  Incomplete  Urine culture     Status: None   Collection Time: 09/15/18  4:14 PM   Specimen: Urine, Clean Catch  Result Value Ref Range Status   Specimen Description URINE, CLEAN CATCH  Final   Special Requests NONE  Final   Culture   Final    NO GROWTH Performed at [facility], [address] [place], [place] [address]    Report Status 09/16/2018 FINAL  Final     Discharge Instructions:   Discharge Instructions    (HEART FAILURE PATIENTS) Call MD:  Anytime you have any of the following symptoms: 1) 3 pound weight gain in 24 hours or 5 pounds in 1 week 2) shortness of breath, with or without a dry hacking cough 3) swelling in the hands, feet or stomach 4) if you have to sleep on extra pillows at night in order to breathe.   Complete by: As directed    Call MD for:  difficulty breathing, headache or visual disturbances   Complete by: As directed    Call MD for:  extreme fatigue   Complete by: As directed    Call MD for:  temperature >100.4   Complete by: As directed    Diet - low sodium heart healthy   Complete by: As directed    Diet Carb Modified   Complete by: As  directed    Increase activity slowly   Complete by: As directed      Allergies as of 09/18/2018   No Known Allergies     Medication List    STOP taking these medications   hydrochlorothiazide 25 MG tablet Commonly known as: HYDRODIURIL  TAKE these medications   amLODipine 5 MG tablet Commonly known as: NORVASC Take 1 tablet (5 mg total) by mouth daily.   apixaban 5 MG Tabs tablet Commonly known as: ELIQUIS Take 1 tablet (5 mg total) by mouth 2 (two) times daily.   atorvastatin 80 MG tablet Commonly known as: LIPITOR Take 1 tablet (80 mg total) by mouth daily at 6 PM.   brimonidine 0.15 % ophthalmic solution Commonly known as: ALPHAGAN Place 1 drop into both eyes 2 (two) times daily.   budesonide-formoterol 80-4.5 MCG/ACT inhaler Commonly known as: SYMBICORT Inhale 2 puffs into the lungs 2 (two) times daily.   Carboxymethylcellulose Sodium 0.25 % Soln Place 2 drops into both eyes 4 (four) times daily as needed (for dry eyes).   doxycycline 100 MG tablet Commonly known as: ADOXA Take 1 tablet (100 mg total) by mouth 2 (two) times daily for 3 days.   finasteride 5 MG tablet Commonly known as: PROSCAR Take 1 tablet (5 mg total) by mouth daily.   furosemide 20 MG tablet Commonly known as: LASIX Take 1 tablet (20 mg total) by mouth daily.   Ipratropium-Albuterol 20-100 MCG/ACT Aers respimat Commonly known as: Combivent Respimat Inhale 1 puff into the lungs every 6 (six) hours as needed for wheezing.   levalbuterol 0.63 MG/3ML nebulizer solution Commonly known as: XOPENEX Take 3 mLs (0.63 mg total) by nebulization every 6 (six) hours as needed for wheezing or shortness of breath.   levothyroxine 25 MCG tablet Commonly known as: SYNTHROID Take 2 tablets (50 mcg total) by mouth daily before breakfast.   lisinopril 40 MG tablet Commonly known as: ZESTRIL Take 0.5 tablets (20 mg total) by mouth daily.   metFORMIN 500 MG tablet Commonly known as:  GLUCOPHAGE Take 1 tablet (500 mg total) by mouth 2 (two) times daily with a meal.   metoprolol tartrate 25 MG tablet Commonly known as: LOPRESSOR Take 1 tablet (25 mg total) by mouth 2 (two) times daily.   Minerin Creme Crea Apply 1 application topically See admin instructions. APPLY TO TRUNK AND LEGS TWICE A DAY   multivitamin with minerals tablet Take 1 tablet by mouth daily.   nitroGLYCERIN 0.4 MG SL tablet Commonly known as: NITROSTAT Place 1 tablet (0.4 mg total) under the tongue every 5 (five) minutes as needed for chest pain. Dissolve one tablet under the tongue every 5 minutes as needed for chest pain. Repeat every 5 minutes if needed for a total of 3 tablets in 15 minutes. If no relief, call 911. What changed:   when to take this  reasons to take this  additional instructions   olopatadine 0.1 % ophthalmic solution Commonly known as: PATANOL Place 1 drop into both eyes 2 (two) times daily.   omeprazole 20 MG capsule Commonly known as: PRILOSEC Take 1 capsule (20 mg total) by mouth daily. .   polyvinyl alcohol 1.4 % ophthalmic solution Commonly known as: LIQUIFILM TEARS Place 1 drop into both eyes 4 (four) times daily.   senna 8.6 MG Tabs tablet Commonly known as: SENOKOT Take 2 tablets (17.2 mg total) by mouth at bedtime.   tamsulosin 0.4 MG Caps capsule Commonly known as: FLOMAX Take 1 capsule (0.4 mg total) by mouth daily.   traZODone 50 MG tablet Commonly known as: DESYREL Take 0.5-1 tablets (25-50 mg total) by mouth at bedtime as needed for sleep.         Time coordinating discharge: 35 minutes.  SignedTrula Ore Himani Corona  Pager [phone]  [facility] 09/18/2018, 10:10 AM

## 2018-09-19 ENCOUNTER — Telehealth: Payer: Self-pay

## 2018-09-19 ENCOUNTER — Telehealth: Payer: Self-pay | Admitting: Family Medicine

## 2018-09-19 NOTE — Telephone Encounter (Signed)
Transition Care Management Follow-up Telephone Call Date of discharge and from where:  09/18/2018, [facility]   Call placed to # [phone], message left requesting a call back to this CM [phone]

## 2018-09-19 NOTE — Telephone Encounter (Signed)
Called [facility] to confirm & verify home health orders. Requesting order for home health nurse.  Also informed her that they would need to call Cardiology for changes to afib/anti coag medication.

## 2018-09-19 NOTE — Telephone Encounter (Signed)
Cara please call home health and confirm home health orders and approved home health orders.  Secondly patient family or home health nurse will need to contact his cardiologist for any changes related to his atrial fibrillation medication anticoagulation medication.  His anticoagulation therapy is managed by Dr. Kirk Ruths.

## 2018-09-19 NOTE — Telephone Encounter (Signed)
[facility] wants to startr orders for 1 week 1, 2 week 2, 1 week 1, and 2 PRN. Patient cant afford the medication nurse want plavix instead

## 2018-09-20 ENCOUNTER — Telehealth: Payer: Self-pay

## 2018-09-20 LAB — CULTURE, BLOOD (ROUTINE X 2)
Culture: NO GROWTH
Culture: NO GROWTH

## 2018-09-20 NOTE — Telephone Encounter (Signed)
Transition Care Management Follow-up Telephone Call  Attempt # 2  Date of discharge and from where: 11/18/2018. [facility]   Call placed to # [phone], message left with call back requested to this CM # [phone]

## 2018-09-24 ENCOUNTER — Telehealth: Payer: Self-pay | Admitting: Cardiology

## 2018-09-24 NOTE — Telephone Encounter (Signed)
Lm to call back ./cy 

## 2018-09-24 NOTE — Telephone Encounter (Signed)
New Message   Pt c/o medication issue:  1. Name of Medication: apixaban (ELIQUIS) 5 MG TABS tablet    2. How are you currently taking this medication (dosage and times per day)?   3. Are you having a reaction (difficulty breathing--STAT)?   4. What is your medication issue? Morey Hummingbird with [facility] is calling on behalf of the patient and his family. She states that the cost of Eliquis is too much so they are wanting to see if there is something else that can be taken in its place. Please call to discuss.

## 2018-09-25 ENCOUNTER — Telehealth: Payer: Self-pay | Admitting: Family Medicine

## 2018-09-25 NOTE — Telephone Encounter (Signed)
Morey Hummingbird called from [facility] asking if she could take a urin tomorrow for the patient the family thinks he is getting a UTI again. Please call Morey Hummingbird back at [phone] you can lvm

## 2018-09-25 NOTE — Telephone Encounter (Signed)
Spoke with [name] to notify her of verbal orders. She expressed understanding. She states that the [facility] agency does use [facility] for labs but that we don't need to put an order in.

## 2018-09-25 NOTE — Telephone Encounter (Signed)
Notify [place] nurse ok to collect urine and take to a [facility] site unless [facility] has a lab preference. I can place a future order for UA to be performed or she can bring the specimen to our office if before  3pm.

## 2018-09-26 NOTE — Telephone Encounter (Signed)
[id number]

## 2018-09-29 ENCOUNTER — Inpatient Hospital Stay (HOSPITAL_COMMUNITY)
Admission: EM | Admit: 2018-09-29 | Discharge: 2018-10-04 | DRG: 871 | Disposition: A | Discharge status: Hospice | Payer: Medicare Other | Attending: Internal Medicine | Admitting: Internal Medicine

## 2018-09-29 ENCOUNTER — Other Ambulatory Visit: Payer: Self-pay

## 2018-09-29 ENCOUNTER — Emergency Department (HOSPITAL_COMMUNITY): Payer: Medicare Other

## 2018-09-29 ENCOUNTER — Encounter (HOSPITAL_COMMUNITY): Payer: Self-pay

## 2018-09-29 DIAGNOSIS — Z79890 Hormone replacement therapy (postmenopausal): Secondary | ICD-10-CM | POA: Diagnosis not present

## 2018-09-29 DIAGNOSIS — H9190 Unspecified hearing loss, unspecified ear: Secondary | ICD-10-CM | POA: Diagnosis present

## 2018-09-29 DIAGNOSIS — R410 Disorientation, unspecified: Secondary | ICD-10-CM | POA: Diagnosis not present

## 2018-09-29 DIAGNOSIS — A419 Sepsis, unspecified organism: Principal | ICD-10-CM | POA: Diagnosis present

## 2018-09-29 DIAGNOSIS — R0902 Hypoxemia: Secondary | ICD-10-CM

## 2018-09-29 DIAGNOSIS — J9600 Acute respiratory failure, unspecified whether with hypoxia or hypercapnia: Secondary | ICD-10-CM

## 2018-09-29 DIAGNOSIS — J690 Pneumonitis due to inhalation of food and vomit: Secondary | ICD-10-CM | POA: Diagnosis present

## 2018-09-29 DIAGNOSIS — Z7984 Long term (current) use of oral hypoglycemic drugs: Secondary | ICD-10-CM

## 2018-09-29 DIAGNOSIS — IMO0002 Reserved for concepts with insufficient information to code with codable children: Secondary | ICD-10-CM | POA: Diagnosis present

## 2018-09-29 DIAGNOSIS — N400 Enlarged prostate without lower urinary tract symptoms: Secondary | ICD-10-CM

## 2018-09-29 DIAGNOSIS — E1165 Type 2 diabetes mellitus with hyperglycemia: Secondary | ICD-10-CM

## 2018-09-29 DIAGNOSIS — I4891 Unspecified atrial fibrillation: Secondary | ICD-10-CM | POA: Diagnosis present

## 2018-09-29 DIAGNOSIS — E1151 Type 2 diabetes mellitus with diabetic peripheral angiopathy without gangrene: Secondary | ICD-10-CM | POA: Diagnosis present

## 2018-09-29 DIAGNOSIS — Z7189 Other specified counseling: Secondary | ICD-10-CM

## 2018-09-29 DIAGNOSIS — Z79899 Other long term (current) drug therapy: Secondary | ICD-10-CM | POA: Diagnosis not present

## 2018-09-29 DIAGNOSIS — Z20828 Contact with and (suspected) exposure to other viral communicable diseases: Secondary | ICD-10-CM | POA: Diagnosis present

## 2018-09-29 DIAGNOSIS — R0600 Dyspnea, unspecified: Secondary | ICD-10-CM

## 2018-09-29 DIAGNOSIS — Z515 Encounter for palliative care: Secondary | ICD-10-CM

## 2018-09-29 DIAGNOSIS — Z781 Physical restraint status: Secondary | ICD-10-CM

## 2018-09-29 DIAGNOSIS — R10811 Right upper quadrant abdominal tenderness: Secondary | ICD-10-CM | POA: Diagnosis not present

## 2018-09-29 DIAGNOSIS — J449 Chronic obstructive pulmonary disease, unspecified: Secondary | ICD-10-CM | POA: Diagnosis present

## 2018-09-29 DIAGNOSIS — I248 Other forms of acute ischemic heart disease: Secondary | ICD-10-CM | POA: Diagnosis present

## 2018-09-29 DIAGNOSIS — Z66 Do not resuscitate: Secondary | ICD-10-CM | POA: Diagnosis present

## 2018-09-29 DIAGNOSIS — I4819 Other persistent atrial fibrillation: Secondary | ICD-10-CM

## 2018-09-29 DIAGNOSIS — E039 Hypothyroidism, unspecified: Secondary | ICD-10-CM | POA: Diagnosis present

## 2018-09-29 DIAGNOSIS — F0390 Unspecified dementia without behavioral disturbance: Secondary | ICD-10-CM | POA: Diagnosis present

## 2018-09-29 DIAGNOSIS — Z87891 Personal history of nicotine dependence: Secondary | ICD-10-CM

## 2018-09-29 DIAGNOSIS — G9341 Metabolic encephalopathy: Secondary | ICD-10-CM | POA: Diagnosis present

## 2018-09-29 DIAGNOSIS — I5033 Acute on chronic diastolic (congestive) heart failure: Secondary | ICD-10-CM | POA: Diagnosis present

## 2018-09-29 DIAGNOSIS — I5031 Acute diastolic (congestive) heart failure: Secondary | ICD-10-CM | POA: Diagnosis not present

## 2018-09-29 DIAGNOSIS — H409 Unspecified glaucoma: Secondary | ICD-10-CM | POA: Diagnosis present

## 2018-09-29 DIAGNOSIS — Z7951 Long term (current) use of inhaled steroids: Secondary | ICD-10-CM

## 2018-09-29 DIAGNOSIS — Z7901 Long term (current) use of anticoagulants: Secondary | ICD-10-CM | POA: Diagnosis not present

## 2018-09-29 DIAGNOSIS — R609 Edema, unspecified: Secondary | ICD-10-CM

## 2018-09-29 DIAGNOSIS — I2510 Atherosclerotic heart disease of native coronary artery without angina pectoris: Secondary | ICD-10-CM | POA: Diagnosis present

## 2018-09-29 DIAGNOSIS — I10 Essential (primary) hypertension: Secondary | ICD-10-CM | POA: Diagnosis present

## 2018-09-29 DIAGNOSIS — Z8673 Personal history of transient ischemic attack (TIA), and cerebral infarction without residual deficits: Secondary | ICD-10-CM

## 2018-09-29 DIAGNOSIS — J9601 Acute respiratory failure with hypoxia: Secondary | ICD-10-CM | POA: Diagnosis present

## 2018-09-29 DIAGNOSIS — R6520 Severe sepsis without septic shock: Secondary | ICD-10-CM | POA: Diagnosis present

## 2018-09-29 DIAGNOSIS — R4182 Altered mental status, unspecified: Secondary | ICD-10-CM | POA: Diagnosis present

## 2018-09-29 DIAGNOSIS — I110 Hypertensive heart disease with heart failure: Secondary | ICD-10-CM | POA: Diagnosis present

## 2018-09-29 LAB — CBC WITH DIFFERENTIAL/PLATELET
Abs Immature Granulocytes: 0.12 10*3/uL — ABNORMAL HIGH (ref 0.00–0.07)
Basophils Absolute: 0.1 10*3/uL (ref 0.0–0.1)
Basophils Relative: 0 %
Eosinophils Absolute: 0.1 10*3/uL (ref 0.0–0.5)
Eosinophils Relative: 0 %
HCT: 41.8 % (ref 39.0–52.0)
Hemoglobin: 13 g/dL (ref 13.0–17.0)
Immature Granulocytes: 1 %
Lymphocytes Relative: 4 %
Lymphs Abs: 1 10*3/uL (ref 0.7–4.0)
MCH: 27.7 pg (ref 26.0–34.0)
MCHC: 31.1 g/dL (ref 30.0–36.0)
MCV: 89.1 fL (ref 80.0–100.0)
Monocytes Absolute: 0.7 10*3/uL (ref 0.1–1.0)
Monocytes Relative: 3 %
Neutro Abs: 21 10*3/uL — ABNORMAL HIGH (ref 1.7–7.7)
Neutrophils Relative %: 92 %
Platelets: 184 10*3/uL (ref 150–400)
RBC: 4.69 MIL/uL (ref 4.22–5.81)
RDW: 13.8 % (ref 11.5–15.5)
WBC: 22.9 10*3/uL — ABNORMAL HIGH (ref 4.0–10.5)
nRBC: 0 % (ref 0.0–0.2)

## 2018-09-29 LAB — COMPREHENSIVE METABOLIC PANEL
ALT: 21 U/L (ref 0–44)
AST: 23 U/L (ref 15–41)
Albumin: 4.2 g/dL (ref 3.5–5.0)
Alkaline Phosphatase: 60 U/L (ref 38–126)
Anion gap: 13 (ref 5–15)
BUN: 14 mg/dL (ref 8–23)
CO2: 22 mmol/L (ref 22–32)
Calcium: 9.2 mg/dL (ref 8.9–10.3)
Chloride: 101 mmol/L (ref 98–111)
Creatinine, Ser: 0.83 mg/dL (ref 0.61–1.24)
GFR calc Af Amer: >60 mL/min (ref >60)
GFR calc non Af Amer: >60 mL/min (ref >60)
Glucose, Bld: 139 mg/dL — ABNORMAL HIGH (ref 70–99)
Potassium: 4 mmol/L (ref 3.5–5.1)
Sodium: 136 mmol/L (ref 135–145)
Total Bilirubin: 0.8 mg/dL (ref 0.3–1.2)
Total Protein: 7 g/dL (ref 6.5–8.1)

## 2018-09-29 LAB — URINALYSIS, ROUTINE W REFLEX MICROSCOPIC
Bacteria, UA: NONE SEEN
Bilirubin Urine: NEGATIVE
Glucose, UA: 50 mg/dL — AB
Hgb urine dipstick: NEGATIVE
Ketones, ur: NEGATIVE mg/dL
Leukocytes,Ua: NEGATIVE
Nitrite: NEGATIVE
Protein, ur: 100 mg/dL — AB
Specific Gravity, Urine: 1.015 (ref 1.005–1.030)
pH: 7 (ref 5.0–8.0)

## 2018-09-29 LAB — LIPASE, BLOOD: Lipase: 32 U/L (ref 11–51)

## 2018-09-29 LAB — LACTIC ACID, PLASMA: Lactic Acid, Venous: 1.8 mmol/L (ref 0.5–1.9)

## 2018-09-29 LAB — TROPONIN I (HIGH SENSITIVITY)
Troponin I (High Sensitivity): 9 ng/L (ref <18)
Troponin I (High Sensitivity): 29 ng/L — ABNORMAL HIGH (ref <18)

## 2018-09-29 LAB — SARS CORONAVIRUS 2 BY RT PCR (HOSPITAL ORDER, PERFORMED IN ~~LOC~~ HOSPITAL LAB): SARS Coronavirus 2: NEGATIVE

## 2018-09-29 MED ORDER — IPRATROPIUM-ALBUTEROL 0.5-2.5 (3) MG/3ML IN SOLN
3.0000 mL | Freq: Four times a day (QID) | RESPIRATORY_TRACT | Status: DC | PRN
  Administered 2018-09-30 – 2018-10-01 (×2): 3 mL via RESPIRATORY_TRACT
  Filled 2018-09-29 (×2): qty 3

## 2018-09-29 MED ORDER — VANCOMYCIN HCL 10 G IV SOLR
1500.0000 mg | INTRAVENOUS | Status: DC
  Administered 2018-09-30: 21:00:00 1500 mg via INTRAVENOUS
  Filled 2018-09-29 (×2): qty 1500

## 2018-09-29 MED ORDER — LORAZEPAM 2 MG/ML IJ SOLN
0.5000 mg | Freq: Once | INTRAMUSCULAR | Status: AC
  Administered 2018-09-29: 16:00:00 0.5 mg via INTRAVENOUS
  Filled 2018-09-29: qty 1

## 2018-09-29 MED ORDER — IOHEXOL 300 MG/ML  SOLN
100.0000 mL | Freq: Once | INTRAMUSCULAR | Status: AC | PRN
  Administered 2018-09-29: 17:00:00 100 mL via INTRAVENOUS

## 2018-09-29 MED ORDER — METRONIDAZOLE IN NACL 5-0.79 MG/ML-% IV SOLN
500.0000 mg | Freq: Once | INTRAVENOUS | Status: AC
  Administered 2018-09-29: 18:00:00 500 mg via INTRAVENOUS
  Filled 2018-09-29: qty 100

## 2018-09-29 MED ORDER — VANCOMYCIN HCL 10 G IV SOLR
1500.0000 mg | INTRAVENOUS | Status: AC
  Administered 2018-09-29: 17:00:00 1500 mg via INTRAVENOUS
  Filled 2018-09-29: qty 1500

## 2018-09-29 MED ORDER — IPRATROPIUM-ALBUTEROL 0.5-2.5 (3) MG/3ML IN SOLN: 3.0000 mL | Freq: Four times a day (QID) | RESPIRATORY_TRACT | Status: DC

## 2018-09-29 MED ORDER — VANCOMYCIN HCL IN DEXTROSE 1-5 GM/200ML-% IV SOLN: 1000.0000 mg | Freq: Once | INTRAVENOUS | Status: DC

## 2018-09-29 MED ORDER — ACETAMINOPHEN 650 MG RE SUPP
650.0000 mg | Freq: Once | RECTAL | Status: AC
  Administered 2018-09-29: 16:00:00 650 mg via RECTAL
  Filled 2018-09-29: qty 1

## 2018-09-29 MED ORDER — SODIUM CHLORIDE (PF) 0.9 % IJ SOLN
INTRAMUSCULAR | Status: AC
  Filled 2018-09-29: qty 50

## 2018-09-29 MED ORDER — SODIUM CHLORIDE 0.9 % IV BOLUS
1000.0000 mL | Freq: Once | INTRAVENOUS | Status: AC
  Administered 2018-09-29: 1000 mL via INTRAVENOUS

## 2018-09-29 MED ORDER — LORAZEPAM 2 MG/ML IJ SOLN
0.5000 mg | Freq: Once | INTRAMUSCULAR | Status: AC
  Administered 2018-09-29: 18:00:00 0.5 mg via INTRAVENOUS

## 2018-09-29 MED ORDER — SODIUM CHLORIDE 0.9 % IV SOLN
2.0000 g | Freq: Once | INTRAVENOUS | Status: AC
  Administered 2018-09-29: 17:00:00 2 g via INTRAVENOUS
  Filled 2018-09-29: qty 2

## 2018-09-29 MED ORDER — SODIUM CHLORIDE 0.9 % IV SOLN
2.0000 g | Freq: Three times a day (TID) | INTRAVENOUS | Status: DC
  Administered 2018-09-30 – 2018-10-01 (×5): 2 g via INTRAVENOUS
  Filled 2018-09-29 (×7): qty 2

## 2018-09-29 NOTE — ED Notes (Signed)
Bed: FI43 Expected date: 09/29/18 Expected time: 1:43 PM Means of arrival: Ambulance Comments: 85 yo resolved CP now upper abd pain

## 2018-09-29 NOTE — ED Notes (Signed)
He had become very agitated, so much so that he had an elevated heart rate (~140). He is much more calm and his heart rate is in the 80,s-90,s. A safety sitter remains with him also.

## 2018-09-29 NOTE — ED Notes (Signed)
He is somewhat tachypneic and is coughing frequently. Protocol orders entered.

## 2018-09-29 NOTE — Progress Notes (Signed)
Pharmacy Antibiotic Note  [name] is a 85 y.o. male admitted on 09/29/2018 with suspected intra-abdominal infection.  In the ED, patient has received Vancomycin 1500mg , Cefepime 2gm and Metronidazole 500mg  IV x 1 dose each.  Upon admission, Pharmacy has been consulted for Vancomycin and Cefepime dosing.  Plan:  Vancomycin 1500 mg IV Q 24 hrs. Goal AUC 400-550. Expected AUC: 457.9; SCr used: 0.83  Cefepime 2gm IV q8h  Metronidazole per MD  Follow renal function  F/U blood culture results and sensitivities   Height: 6' (182.9 cm) Weight: 170 lb (77.1 kg) IBW/kg (Calculated) : 77.6  Temp (24hrs), Avg:100.9 F (38.3 C), Min:99.4 F (37.4 C), Max:103.1 F (39.5 C)  Recent Labs  Lab 09/29/18 1543 09/29/18 1700  WBC 22.9*  --   CREATININE 0.83  --   LATICACIDVEN  --  1.8    Estimated Creatinine Clearance: 65.8 mL/min (by C-G formula based on SCr of 0.83 mg/dL).    No Known Allergies  Antimicrobials this admission: 7/5 Metronidazole >>   7/5 Cefepime >>   7/5 Vanc >>  Dose adjustments this admission:    Microbiology results: 7/5 BCx: sent 7/5 SARS Covid 2: negative    Thank you for allowing pharmacy to be a part of this patient's care.  Everette Rank, PharmD 09/29/2018 8:38 PM

## 2018-09-29 NOTE — H&P (Signed)
History and Physical    Bruce ElizabethWilburn L Bolotin ZOX:[id number] DOB: 07/29/1927 DOA: 09/29/2018  PCP: Bing NeighborsHarris, Kimberly S, FNP  Patient coming from: Home; lives with son  I have personally briefly reviewed patient's old medical records [facility]  Chief Complaint: Altered mental status  HPI: [name] is a 85 y.o. male with medical history significant of presumed dementia, atrial fibrillation, COPD, CVA, type 2 diabetes mellitus, HTN, hearing impairment, hypothyroidism, CAD and glaucoma who presents from home with progressive confusion.  Patient unable to participate in HPI due to his current mental status, and majority is obtained from ED provider as well as his son via telephone.  Patient currently resides with his son, Windy FastRonald.  He has been staying with his other son over the past few days.  His son reports that over the past few days he has been complaining of stomach and low back pain that has significantly progressed with now confusion.  He was recently hospitalized at [facility] from 6/21 through 09/18/2018 for pneumonia.  His sons also report that he has history of urinary tract infections that also cause significant confusion in the past.  They report that he completed his home course of doxycycline as prescribed for his recent pneumonia.  His son denies any new medication changes or changes in dietary habit.  No other significant history in interim from his previous hospitalization per his son.   ED Course: Temperature 103.1, HR 115, RR 34, BP 115/44, SPO2 92% on room air.  WBC count 22.9, hemoglobin 13.0, platelets 184.  Sodium 136, potassium 4.0, chloride 101, CO2 22, BUN 14, creatinine 0.83, glucose 139.  Anion gap 13.  Lactic acid 1.8.  Troponin high-sensitivity 9, within normal limits.  COVID-19 test negative.  Urinalysis unrevealing.  Chest x-ray with no acute cardiopulmonary disease process.  CT abdomen/pelvis notable for possible distended gallbladder.  Right upper quadrant  ultrasound noted left lobe liver cyst, no gallstones or gallbladder wall thickening and minimal pericholecystic fluid noted; unable to perform sonographic Murphy sign due to patient's mental status.  Patient was started on cefepime, Flagyl, vancomycin.  Blood cultures x2 were ordered and pending.  Patient was given a 1 L normal saline bolus.  Patient referred for admission for sepsis of unclear etiology.   Review of Systems: Unable to obtain secondary to patient's current mental status.  Past Medical History:  Diagnosis Date   Atrial fibrillation (HCC)    COPD (chronic obstructive pulmonary disease) (HCC)    CVA (cerebral infarction)    Diabetes mellitus without complication (HCC)    Essential hypertension, benign    Hard of hearing     Past Surgical History:  Procedure Laterality Date   HERNIA REPAIR     LEFT HEART CATHETERIZATION WITH CORONARY ANGIOGRAM N/A 03/28/2013   Procedure: LEFT HEART CATHETERIZATION WITH CORONARY ANGIOGRAM;  Surgeon: Peter M SwazilandJordan, MD;  [facility];  Service: Cardiovascular;  Laterality: N/A;     reports that he has quit smoking. His smoking use included cigarettes. He has quit using smokeless tobacco. He reports that he does not drink alcohol or use drugs.  No Known Allergies  Family History  Problem Relation Age of Onset   CAD Son    Skin cancer Brother     Prior to Admission medications   Medication Sig Start Date End Date Taking? Authorizing Provider  amLODipine (NORVASC) 5 MG tablet Take 1 tablet (5 mg total) by mouth daily. 08/08/18  Yes Roena MaladyLassen, Arlo C, PA-C  atorvastatin (LIPITOR) 80 MG tablet Take 1 tablet (80 mg total) by mouth daily at 6 PM. 08/08/18  Yes Oscar La, Arlo C, PA-C  brimonidine (ALPHAGAN) 0.15 % ophthalmic solution Place 1 drop into both eyes 2 (two) times daily. 08/08/18  Yes Lassen, Arlo C, PA-C  budesonide-formoterol (SYMBICORT) 80-4.5 MCG/ACT inhaler Inhale 2 puffs into the lungs 2 (two) times daily. 08/08/18  Yes  Lassen, Arlo C, PA-C  Carboxymethylcellulose Sodium 0.25 % SOLN Place 2 drops into both eyes 4 (four) times daily as needed (for dry eyes). 08/08/18  Yes Lassen, Arlo C, PA-C  finasteride (PROSCAR) 5 MG tablet Take 1 tablet (5 mg total) by mouth daily. 08/08/18  Yes Lassen, Arlo C, PA-C  furosemide (LASIX) 20 MG tablet Take 1 tablet (20 mg total) by mouth daily. 09/18/18  Yes Rama, Venetia Maxon, MD  Ipratropium-Albuterol (COMBIVENT RESPIMAT) 20-100 MCG/ACT AERS respimat Inhale 1 puff into the lungs every 6 (six) hours as needed for wheezing. 08/08/18  Yes Lassen, Arlo C, PA-C  levalbuterol (XOPENEX) 0.63 MG/3ML nebulizer solution Take 3 mLs (0.63 mg total) by nebulization every 6 (six) hours as needed for wheezing or shortness of breath. 08/08/18  Yes Oscar La, Arlo C, PA-C  levothyroxine (SYNTHROID) 25 MCG tablet Take 2 tablets (50 mcg total) by mouth daily before breakfast. 08/08/18  Yes Oscar La, Arlo C, PA-C  lisinopril (ZESTRIL) 40 MG tablet Take 0.5 tablets (20 mg total) by mouth daily. 08/08/18  Yes Oscar La, Arlo C, PA-C  metFORMIN (GLUCOPHAGE) 500 MG tablet Take 1 tablet (500 mg total) by mouth 2 (two) times daily with a meal. 08/08/18  Yes Lassen, Arlo C, PA-C  metoprolol tartrate (LOPRESSOR) 25 MG tablet Take 1 tablet (25 mg total) by mouth 2 (two) times daily. 08/08/18  Yes Lassen, Arlo C, PA-C  Multiple Vitamins-Minerals (MULTIVITAMIN WITH MINERALS) tablet Take 1 tablet by mouth daily. 08/08/18  Yes Lassen, Arlo C, PA-C  olopatadine (PATANOL) 0.1 % ophthalmic solution Place 1 drop into both eyes 2 (two) times daily. 08/08/18  Yes Lassen, Arlo C, PA-C  omeprazole (PRILOSEC) 20 MG capsule Take 1 capsule (20 mg total) by mouth daily. . 08/08/18  Yes Oscar La, Arlo C, PA-C  polyvinyl alcohol (LIQUIFILM TEARS) 1.4 % ophthalmic solution Place 1 drop into both eyes 4 (four) times daily. 08/08/18  Yes Lassen, Arlo C, PA-C  senna (SENOKOT) 8.6 MG TABS tablet Take 2 tablets (17.2 mg total) by mouth at bedtime. 08/08/18   Yes Lassen, Arlo C, PA-C  tamsulosin (FLOMAX) 0.4 MG CAPS capsule Take 1 capsule (0.4 mg total) by mouth daily. 08/08/18  Yes Lassen, Arlo C, PA-C  traZODone (DESYREL) 50 MG tablet Take 0.5-1 tablets (25-50 mg total) by mouth at bedtime as needed for sleep. 09/05/18  Yes Scot Jun, FNP  apixaban (ELIQUIS) 5 MG TABS tablet Take 1 tablet (5 mg total) by mouth 2 (two) times daily. Patient not taking: Reported on 09/29/2018 09/18/18   Rama, Venetia Maxon, MD  nitroGLYCERIN (NITROSTAT) 0.4 MG SL tablet Place 1 tablet (0.4 mg total) under the tongue every 5 (five) minutes as needed for chest pain. Dissolve one tablet under the tongue every 5 minutes as needed for chest pain. Repeat every 5 minutes if needed for a total of 3 tablets in 15 minutes. If no relief, call 911. Patient taking differently: Place 0.4 mg under the tongue every 5 (five) minutes x 3 doses as needed for chest pain (CALL 9-1-1, IF NO RELIEF).  08/08/18   Wille Celeste, PA-C  Skin Protectants, Misc. (MINERIN) CREA Apply 1 application topically See admin instructions. APPLY TO TRUNK AND LEGS TWICE A DAY     [provider]    Physical Exam: Vitals:   09/29/18 1700 09/29/18 1915 09/29/18 1920 09/29/18 1935  BP: 138/76  (!) 115/44   Pulse: 89 89 87   Resp: (!) 29 (!) 25 (!) 22   Temp:    100.1 F (37.8 C)  TempSrc:    Rectal  SpO2: (!) 88% 92% 92%   Weight:    77.1 kg  Height:    6' (1.829 m)    Constitutional: NAD, calm, comfortable Vitals:   09/29/18 1700 09/29/18 1915 09/29/18 1920 09/29/18 1935  BP: 138/76  (!) 115/44   Pulse: 89 89 87   Resp: (!) 29 (!) 25 (!) 22   Temp:    100.1 F (37.8 C)  TempSrc:    Rectal  SpO2: (!) 88% 92% 92%   Weight:    77.1 kg  Height:    6' (1.829 m)   Eyes: PERRL, lids and conjunctivae normal ENMT: Mucous membranes are dry. Posterior pharynx clear of any exudate or lesions.Normal dentition.  Neck: normal, supple, no masses, no thyromegaly, no meningismus Respiratory: Mid  to late expiratory wheezing noted bilateral lung fields, no crackles, normal respiratory effort, on room air Cardiovascular: Tachycardic, regular rhythm, no murmurs/gallops/rubs, no peripheral edema Abdomen: no tenderness, no masses palpated. No hepatosplenomegaly. Bowel sounds positive.  Musculoskeletal: no clubbing / cyanosis. No joint deformity upper and lower extremities. Good ROM, no contractures. Normal muscle tone.  Skin: no rashes, lesions, ulcers. No induration Neurologic: Confused, moves all extremities independently, Psychiatric: Alert, confused, agitated    Labs on Admission: I have personally reviewed following labs and imaging studies  CBC: Recent Labs  Lab 09/29/18 1543  WBC 22.9*  NEUTROABS 21.0*  HGB 13.0  HCT 41.8  MCV 89.1  PLT 184   Basic Metabolic Panel: Recent Labs  Lab 09/29/18 1543  NA 136  K 4.0  CL 101  CO2 22  GLUCOSE 139*  BUN 14  CREATININE 0.83  CALCIUM 9.2   GFR: Estimated Creatinine Clearance: 65.8 mL/min (by C-G formula based on SCr of 0.83 mg/dL). Liver Function Tests: Recent Labs  Lab 09/29/18 1543  AST 23  ALT 21  ALKPHOS 60  BILITOT 0.8  PROT 7.0  ALBUMIN 4.2   Recent Labs  Lab 09/29/18 1543  LIPASE 32   No results for input(s): AMMONIA in the last 168 hours. Coagulation Profile: No results for input(s): INR, PROTIME in the last 168 hours. Cardiac Enzymes: No results for input(s): CKTOTAL, CKMB, CKMBINDEX, TROPONINI in the last 168 hours. BNP (last 3 results) No results for input(s): PROBNP in the last 8760 hours. HbA1C: No results for input(s): HGBA1C in the last 72 hours. CBG: No results for input(s): GLUCAP in the last 168 hours. Lipid Profile: No results for input(s): CHOL, HDL, LDLCALC, TRIG, CHOLHDL, LDLDIRECT in the last 72 hours. Thyroid Function Tests: No results for input(s): TSH, T4TOTAL, FREET4, T3FREE, THYROIDAB in the last 72 hours. Anemia Panel: No results for input(s): VITAMINB12, FOLATE,  FERRITIN, TIBC, IRON, RETICCTPCT in the last 72 hours. Urine analysis:    Component Value Date/Time   COLORURINE YELLOW 09/29/2018 1543   APPEARANCEUR CLEAR 09/29/2018 1543   LABSPEC 1.015 09/29/2018 1543   PHURINE 7.0 09/29/2018 1543   GLUCOSEU 50 (A) 09/29/2018 1543   HGBUR NEGATIVE 09/29/2018 1543   BILIRUBINUR NEGATIVE 09/29/2018 1543  KETONESUR NEGATIVE 09/29/2018 1543   PROTEINUR 100 (A) 09/29/2018 1543   UROBILINOGEN 1.0 10/28/2014 1628   NITRITE NEGATIVE 09/29/2018 1543   LEUKOCYTESUR NEGATIVE 09/29/2018 1543    Radiological Exams on Admission: Dg Chest 2 View  Result Date: 09/29/2018 CLINICAL DATA:  Productive cough, shortness of breath. EXAM: CHEST - 2 VIEW COMPARISON:  Radiograph of September 17, 2018. FINDINGS: The heart size and mediastinal contours are within normal limits. Both lungs are clear. No pneumothorax or pleural effusion is noted. The visualized skeletal structures are unremarkable. IMPRESSION: No active cardiopulmonary disease. Electronically Signed   By: Lupita Raider M.D.   On: 09/29/2018 15:04   Ct Abdomen Pelvis W Contrast  Result Date: 09/29/2018 CLINICAL DATA:  85 year old male with fever of 103, altered mental status. Abdominal pain. EXAM: CT ABDOMEN AND PELVIS WITH CONTRAST TECHNIQUE: Multidetector CT imaging of the abdomen and pelvis was performed using the standard protocol following bolus administration of intravenous contrast. CONTRAST:  OMNIPAQUE IOHEXOL 300 MG/ML  SOLN COMPARISON:  CT Abdomen and Pelvis 10/28/2014. chest radiographs earlier today. FINDINGS: Lower chest: Respiratory motion. No pericardial or pleural effusion. No lung base consolidation. Chronic mild cardiomegaly and small to moderate hiatal hernia. Hepatobiliary: Motion artifact, but the liver and bile ducts appear stable since 2016. There is a small chronic left hepatic lobe cyst. The gallbladder is more distended today, but there is no definite pericholecystic inflammation when  allowing for motion. Right upper quadrant ultrasound would be valuable to confirm. Pancreas: Stable atrophy. Spleen: Negative. Adrenals/Urinary Tract: Normal adrenal glands. Symmetric renal enhancement and contrast excretion with no hydronephrosis. Decompressed and normal proximal ureters. Unremarkable urinary bladder. Stomach/Bowel: Mild chronic sigmoid diverticulosis. Mild retained stool in the distal colon. Decompressed descending colon and splenic flexure. Mild retained stool in the transverse and ascending colon. Negative cecum and appendix (series 2, image 52). Motion artifact, but no convincing large bowel inflammation. Negative terminal ileum. No dilated small bowel. Chronic small bowel containing left inguinal hernia appears stable since 2016 and non incarcerated. Chronic hiatal hernia. Negative intra-abdominal stomach. No free air or free fluid. Vascular/Lymphatic: Aortoiliac calcified atherosclerosis. Ectatic to borderline aneurysmal infrarenal abdominal aorta (30 millimeters diameter) has increased by 1 millimeter since the prior CT. Major arterial structures appear patent. Portal venous system appears patent. No lymphadenopathy. Reproductive: Chronic left inguinal hernia containing a small bowel loop (series 2, image 72) appears similar to that in 2016, with no active inflammation identified. Other: No pelvic free fluid. Musculoskeletal: Motion artifact. Osteopenia. No acute osseous abnormality identified. IMPRESSION: 1. Distended gallbladder today, and motion artifact rendering determination of pericholecystic inflammation difficult. Recommend follow-up Right Upper Quadrant Ultrasound. 2. Otherwise no acute or inflammatory process identified in the abdomen or pelvis. 3. Chronic small bowel containing left inguinal hernia appears stable since 2016 and non incarcerated. Normal appendix. Negative visible lung bases. 4. Aortic Atherosclerosis (ICD10-I70.0). Ectatic to mildly aneurysmal infrarenal aorta.  Recommend followup by Ultrasound in 3 years. This recommendation follows ACR consensus guidelines: White Paper of the ACR Incidental Findings Committee II on Vascular Findings. J Am Coll Radiol 2013; 10:789-794. Aortic aneurysm NOS (ICD10-I71.9) Electronically Signed   By: Odessa Fleming M.D.   On: 09/29/2018 18:01   US Abdomen Limited Ruq  Result Date: 09/29/2018 CLINICAL DATA:  Abdominal tenderness today. EXAM: ULTRASOUND ABDOMEN LIMITED RIGHT UPPER QUADRANT COMPARISON:  None. FINDINGS: Gallbladder: No gallstones or wall thickening visualized. Minimal pericholecystic fluid is identified. Ultrasound technologist could not assess sonographic Murphy sign because the patient was unresponsive.  Common bile duct: Diameter: 4.5 mm Liver: There is a 1.3 x 1.2 x 1.1 cm cyst in the left lobe liver. Within normal limits in parenchymal echogenicity. Portal vein is patent on color Doppler imaging with normal direction of blood flow towards the liver. IMPRESSION: No gallstones or gallbladder wall thickening. Minimal pericholecystic fluid is noted. Ultrasound technologist could not cyst sonographic Murphy sign because the patient was unresponsive. Liver cyst. Electronically Signed   By: Sherian ReinWei-Chen  Lin M.D.   On: 09/29/2018 19:01    EKG: Independently reviewed.  Atrial fibrillation, rate 79  Assessment/Plan Active Problems:   Diabetes mellitus type II, uncontrolled (HCC)   Hypothyroidism   BPH (benign prostatic hyperplasia)   HTN (hypertension)   Atrial fibrillation (HCC)   Altered mental status   Sepsis (HCC)  Severe sepsis, present on admission Acute metabolic encephalopathy Patient presenting from home with progressive reported abdominal pain and low back pain and was found to have an elevated temperature of 103.1, heart rate of 115.  White blood cell count elevated 22.9.  COVID-19/SARS-CoV-2 negative.  CT abdomen/pelvis suggestive of possible gallbladder pathology, although ultrasound right upper quadrant without  gallbladder wall thickening or gallstones. --Consider etiology possible intra-abdominal source with gallbladder as primary culprit, also concern with his low back pain for possible abscess; considered meningitis but patient has no nuchal rigidity with good range of motion of his neck.  Unlikely pneumonia with no findings on chest x-ray.  Urinalysis unrevealing. --General surgery consulted, appreciate assistance --Plan HIDA scan tomorrow, if positive likely IR for percutaneous cholecystostomy --Continue LR bolus for 30 mL/kg --Continue broad-spectrum antibiotics with vancomycin, cefepime, Flagyl --Check CT head without contrast --Check CT L-spine with contrast to rule out epidural abscess given acute back pain --Follow CBC daily, check procalcitonin, trend lactic acid --Supportive care  Elevated troponin Troponin 9-->29.0; etiology likely type II demand ischemia from underlying sepsis as above.  EKG notable for A. fib, rate 79, RBBB --Continue to trend troponin with EKG --Monitor on telemetry --Supportive care  History of persistent atrial fibrillation EKG with A. Fib.  Was prescribed Eliquis, but apparently has not been taking secondary to cost. --Continue metoprolol tartrate 25 mg p.o. twice daily for rate control --Heparin 3 times daily for DVT prophylaxis --Monitor on telemetry  Hx COPD Not decompensated.  Currently satting well on room air.  Chest x-ray without acute findings.  Patient with some mild mid to late expiratory wheezing on physical exam. --Respiratory therapy evaluation --Nebs as needed  Essential hypertension Blood pressure 115/44 on admission, well controlled.  On amlodipine, lisinopril, metoprolol outpatient. --Will hold amlodipine and lisinopril for now and monitor blood pressure closely --Continue metoprolol tartrate as above for rate control for A. fib  Hx CVA --Continue atorvastatin 80 mg p.o. daily  Hypothyroidism --Check TSH --Continue home  Synthroid  Hx glaucoma --Continue home eyedrops  T2DM Last hemoglobin A1c 7.04 July 2018.  On metformin 500 mg p.o. twice daily outpatient. --Hold oral hypoglycemics --NovoLog insulin sliding scale for coverage --We will maintain n.p.o. status until mental status improves  DVT prophylaxis: Heparin Code Status: DNR, verified with patient's son Windy FastRonald regarding his wishes Family Communication: Gust plan of care with patient's son Windy FastRonald via telephone Disposition Plan: Home versus rehab Consults called: General surgery, Dr. Magnus IvanBlackman Admission status: SDU  Severity of Illness: The appropriate patient status for this patient is INPATIENT. Inpatient status is judged to be reasonable and necessary in order to provide the required intensity of service to ensure the patient's safety. The  patient's presenting symptoms, physical exam findings, and initial radiographic and laboratory data in the context of their chronic comorbidities is felt to place them at high risk for further clinical deterioration. Furthermore, it is not anticipated that the patient will be medically stable for discharge from the hospital within 2 midnights of admission. The following factors support the patient status of inpatient.   " The patient's presenting symptoms include encephalopathy, fever, tachycardia " The worrisome physical exam findings include altered mental status " The initial radiographic and laboratory data are worrisome because of elevated white blood cell count, elevated lactic acid " The chronic co-morbidities include advanced age, dementia, A. fib, COPD, type 2 diabetes mellitus, essential hypertension, hypothyroidism, CAD.   * I certify that at the point of admission it is my clinical judgment that the patient will require inpatient hospital care spanning beyond 2 midnights from the point of admission due to high intensity of service, high risk for further deterioration and high frequency of surveillance  required.*   Alvira Philips [place] DO [facility] Pager [phone]  If 7PM-7AM, please contact night-coverage [email or web address] Password [facility]  09/29/2018, 8:28 PM

## 2018-09-29 NOTE — ED Triage Notes (Signed)
EKG performed on arrival.

## 2018-09-29 NOTE — ED Triage Notes (Signed)
His family report pt. To have had "indigestion" and c/o upper abd. ?chest? Discomfort "all day today". They further state that pt. Was recently hospitalized for pneumonia which is "better". He is in no distress and seems to have some memory issues.

## 2018-09-29 NOTE — ED Provider Notes (Signed)
Care handoff received from Martinique Robinson, PA-C at shift change, please see her note for further details.  In short 85 year old male with history of dementia presents today with upper abdominal pain and lower chest pain today, brought in by EMS.  Few episodes of vomiting, baseline mental status per family.  Tachycardic and febrile.  Broad-spectrum antibiotics, fluid and Tylenol given by previous team.  Leukocytosis of 22.9, chest x-ray negative.  Plan of care at shift change is to await CT abdomen pelvis and then admission.  Physical Exam  BP 138/76   Pulse 89   Temp (!) 103.1 F (39.5 C) (Rectal)   Resp (!) 29   SpO2 (!) 88%   Physical Exam Constitutional:      General: He is awake. He is not in acute distress.    Appearance: He is not ill-appearing or toxic-appearing.  HENT:     Head: Normocephalic and atraumatic.     Nose: Nose normal.  Eyes:     General: Gaze aligned appropriately.  Neck:     Musculoskeletal: Normal range of motion and neck supple.     Trachea: Phonation normal. No tracheal deviation.  Pulmonary:     Effort: Pulmonary effort is normal. No accessory muscle usage or respiratory distress.     Breath sounds: Normal air entry.  Abdominal:     Palpations: Abdomen is soft.     Tenderness: There is no abdominal tenderness. There is no guarding or rebound.  Musculoskeletal:     Comments: Moves extremities spontaneously is able stand up in bed without assistance, attempting to get out of bed  Neurological:     Mental Status: He is confused.  Psychiatric:        Behavior: Behavior is uncooperative.    ED Course/Procedures     Procedures  MDM  Lactic 1.8 COVID-19 negative Lipase within normal limits High-sensitivity troponin 9 Urinalysis nonacute CMP nonacute CBC with leukocytosis and left shift Chest x-ray:  IMPRESSION:  No active cardiopulmonary disease.   CT abdomen pelvis:  IMPRESSION:  1. Distended gallbladder today, and motion artifact  rendering  determination of pericholecystic inflammation difficult. Recommend  follow-up Right Upper Quadrant Ultrasound.  2. Otherwise no acute or inflammatory process identified in the  abdomen or pelvis.  3. Chronic small bowel containing left inguinal hernia appears  stable since 2016 and non incarcerated. Normal appendix. Negative  visible lung bases.  4. Aortic Atherosclerosis (ICD10-I70.0). Ectatic to mildly  aneurysmal infrarenal aorta. Recommend followup by Ultrasound in 3  years. This recommendation follows ACR consensus guidelines: White  Paper of the ACR Incidental Findings Committee II on Vascular  Findings. J Am Coll Radiol 2013; 10:789-794. Aortic aneurysm NOS  (ICD10-I71.9)   RUQ [place]:  IMPRESSION:  No gallstones or gallbladder wall thickening. Minimal  pericholecystic fluid is noted. Ultrasound technologist could not  cyst sonographic Murphy sign because the patient was unresponsive.    Liver cyst.  - Consult called to hospitalist who is asked that we consult general surgery for weigh-in regarding CT gallbladder findings.  Consult placed. - Discussed case with general surgery, Dr. Ninfa Linden who is aware of patient. - 8:15 PM: Patient's son, Cristino Martes updated on care plan.  Phone #[phone] - Patient reassessed, confused, intermittently yelling, no peritoneal signs on reexamination of the abdomen.  Agitation controlled with Ativan. Fever has improved following Tylenol here in the ER.  Patient has been admitted to hospitalist service for further evaluation and management.   Note: Portions of this report may  have been transcribed using voice recognition software. Every effort was made to ensure accuracy; however, inadvertent computerized transcription errors may still be present.   Elizabeth PalauMorelli,  A, PA-C 09/29/18 2038    Benjiman CorePickering, Nathan, MD 09/29/18 2225

## 2018-09-29 NOTE — Progress Notes (Signed)
Patient ID: [name], male   DOB: [birth year], 85 y.o.   MRN: [id number]   Surgery has been asked to review. CT scan and ultrasound fairly unremarkable Given fever and WBC, will order a HIDA scan for tomorrow.  If positive, will ask IR to see to consider percutaneous cholecystostomy.

## 2018-09-29 NOTE — ED Notes (Signed)
Date and time results received: 09/29/18 8:48 PM  (use smartphrase ".now" to insert current time)  Test: Troponin  (High Sensitivity) Critical Value: 29  Name of Provider Notified: Dr.[place]  Orders Received? Or Actions Taken?: Repeat EKG and continuing Troponin Trending

## 2018-09-29 NOTE — Progress Notes (Signed)
A consult was received from an ED physician for Cefepime and Vancomycin per pharmacy dosing.  The patient's profile has been reviewed for ht/wt/allergies/indication/available labs.    Last recorded weight in Epic = 72.6 kg on 09/15/2018  A one time order has been placed for Vancomcyin 1500mg  IV and Cefepime 2gm IV.  Further antibiotics/pharmacy consults should be ordered by admitting physician if indicated.                       Thank you, Everette Rank, PharmD 09/29/2018  4:34 PM

## 2018-09-29 NOTE — ED Provider Notes (Signed)
[facility]-EMERGENCY DEPT Provider Note   CSN: [id number] Arrival date & time: 09/29/18  1351    History   Chief Complaint Chief Complaint  Patient presents with   Abdominal Pain    HPI [name] is a 85 y.o. male past medical history of atrial fibrillation on Eliquis, COPD, CVA, diabetes, CAD, recent admission for pneumonia, presenting to the emergency department via EMS from home with complaints of abdominal pain and lower chest pain.  Patient's son, [name], reports he woke up not feeling well today.  He was complaining of sharp abdominal pain and was requesting they call EMS.  He states patient's pain seemed to improve when they put pressure on his abdomen and back.  He also had a few episodes of vomiting today, no known episodes of diarrhea.  He was treated with Tylenol this morning as well as a full dose of aspirin today prior to arrival.  He has baseline dementia and history of strokes.  They state he is in and out of and lucid, he can become disoriented easily when he is away from home.  Sometimes he can answer directed questions and sometimes he cannot.  It is not uncommon for him to not know where he is or orientation questions.  He is at his baseline today prior to arrival.    The history is provided by a relative. The history is limited by the condition of the patient.   Level 5 caveat secondary to dementia.   Past Medical History:  Diagnosis Date   Atrial fibrillation (HCC)    COPD (chronic obstructive pulmonary disease) (HCC)    CVA (cerebral infarction)    Diabetes mellitus without complication (HCC)    Essential hypertension, benign    Hard of hearing     Patient Active Problem List   Diagnosis Date Noted   Dysphasia 09/17/2018   HCAP (healthcare-associated pneumonia) 09/15/2018   Acute CHF (congestive heart failure) (HCC) 09/15/2018   Enterococcus UTI 08/07/2018   Neurocognitive deficits 08/07/2018   Urinary tract  infection with hematuria    Malnutrition of moderate degree 07/31/2018   AMS (altered mental status) 07/14/2018   Altered mental status    Stroke (HCC) 07/10/2018   Requires supplemental oxygen 06/05/2018   Chest pain 03/29/2018   Atrial fibrillation (HCC) 05/20/2015   RBBB 05/20/2015   Prolonged Q-T interval on ECG 05/20/2015   CAD (coronary artery disease) 05/20/2015   History of CVA (cerebrovascular accident) 05/20/2015   Atrial fibrillation with RVR (HCC)    Bronchiectasis with acute exacerbation (HCC)    Unstable angina (HCC) 03/27/2013   COPD exacerbation (HCC) 03/25/2013   Diabetes mellitus type II, uncontrolled (HCC) 03/25/2013   Hypothyroidism 03/25/2013   BPH (benign prostatic hyperplasia) 03/25/2013   HTN (hypertension) 03/25/2013    Past Surgical History:  Procedure Laterality Date   HERNIA REPAIR     LEFT HEART CATHETERIZATION WITH CORONARY ANGIOGRAM N/A 03/28/2013   Procedure: LEFT HEART CATHETERIZATION WITH CORONARY ANGIOGRAM;  Surgeon: Peter M [place], MD;  Location: [facility] CATH LAB;  Service: Cardiovascular;  Laterality: N/A;        Home Medications    Prior to Admission medications   Medication Sig Start Date End Date Taking? Authorizing Provider  amLODipine (NORVASC) 5 MG tablet Take 1 tablet (5 mg total) by mouth daily. 08/08/18  Yes Lassen, Arlo C, PA-C  atorvastatin (LIPITOR) 80 MG tablet Take 1 tablet (80 mg total) by mouth daily at 6 PM. 08/08/18  Yes  Oscar La, Arlo C, PA-C  brimonidine (ALPHAGAN) 0.15 % ophthalmic solution Place 1 drop into both eyes 2 (two) times daily. 08/08/18  Yes Lassen, Arlo C, PA-C  budesonide-formoterol (SYMBICORT) 80-4.5 MCG/ACT inhaler Inhale 2 puffs into the lungs 2 (two) times daily. 08/08/18  Yes Lassen, Arlo C, PA-C  Carboxymethylcellulose Sodium 0.25 % SOLN Place 2 drops into both eyes 4 (four) times daily as needed (for dry eyes). 08/08/18  Yes Lassen, Arlo C, PA-C  finasteride (PROSCAR) 5 MG tablet Take 1  tablet (5 mg total) by mouth daily. 08/08/18  Yes Lassen, Arlo C, PA-C  furosemide (LASIX) 20 MG tablet Take 1 tablet (20 mg total) by mouth daily. 09/18/18  Yes Rama, Venetia Maxon, MD  Ipratropium-Albuterol (COMBIVENT RESPIMAT) 20-100 MCG/ACT AERS respimat Inhale 1 puff into the lungs every 6 (six) hours as needed for wheezing. 08/08/18  Yes Lassen, Arlo C, PA-C  levalbuterol (XOPENEX) 0.63 MG/3ML nebulizer solution Take 3 mLs (0.63 mg total) by nebulization every 6 (six) hours as needed for wheezing or shortness of breath. 08/08/18  Yes Oscar La, Arlo C, PA-C  levothyroxine (SYNTHROID) 25 MCG tablet Take 2 tablets (50 mcg total) by mouth daily before breakfast. 08/08/18  Yes Oscar La, Arlo C, PA-C  lisinopril (ZESTRIL) 40 MG tablet Take 0.5 tablets (20 mg total) by mouth daily. 08/08/18  Yes Oscar La, Arlo C, PA-C  metFORMIN (GLUCOPHAGE) 500 MG tablet Take 1 tablet (500 mg total) by mouth 2 (two) times daily with a meal. 08/08/18  Yes Lassen, Arlo C, PA-C  metoprolol tartrate (LOPRESSOR) 25 MG tablet Take 1 tablet (25 mg total) by mouth 2 (two) times daily. 08/08/18  Yes Lassen, Arlo C, PA-C  Multiple Vitamins-Minerals (MULTIVITAMIN WITH MINERALS) tablet Take 1 tablet by mouth daily. 08/08/18  Yes Lassen, Arlo C, PA-C  olopatadine (PATANOL) 0.1 % ophthalmic solution Place 1 drop into both eyes 2 (two) times daily. 08/08/18  Yes Lassen, Arlo C, PA-C  omeprazole (PRILOSEC) 20 MG capsule Take 1 capsule (20 mg total) by mouth daily. . 08/08/18  Yes Oscar La, Arlo C, PA-C  polyvinyl alcohol (LIQUIFILM TEARS) 1.4 % ophthalmic solution Place 1 drop into both eyes 4 (four) times daily. 08/08/18  Yes Lassen, Arlo C, PA-C  senna (SENOKOT) 8.6 MG TABS tablet Take 2 tablets (17.2 mg total) by mouth at bedtime. 08/08/18  Yes Lassen, Arlo C, PA-C  tamsulosin (FLOMAX) 0.4 MG CAPS capsule Take 1 capsule (0.4 mg total) by mouth daily. 08/08/18  Yes Lassen, Arlo C, PA-C  traZODone (DESYREL) 50 MG tablet Take 0.5-1 tablets (25-50 mg total)  by mouth at bedtime as needed for sleep. 09/05/18  Yes Scot Jun, FNP  apixaban (ELIQUIS) 5 MG TABS tablet Take 1 tablet (5 mg total) by mouth 2 (two) times daily. Patient not taking: Reported on 09/29/2018 09/18/18   Rama, Venetia Maxon, MD  nitroGLYCERIN (NITROSTAT) 0.4 MG SL tablet Place 1 tablet (0.4 mg total) under the tongue every 5 (five) minutes as needed for chest pain. Dissolve one tablet under the tongue every 5 minutes as needed for chest pain. Repeat every 5 minutes if needed for a total of 3 tablets in 15 minutes. If no relief, call 911. Patient taking differently: Place 0.4 mg under the tongue every 5 (five) minutes x 3 doses as needed for chest pain (CALL 9-1-1, IF NO RELIEF).  08/08/18   Wille Celeste, PA-C  Skin Protectants, Misc. (MINERIN) CREA Apply 1 application topically See admin instructions. APPLY TO TRUNK AND LEGS TWICE A  DAY     [provider]    Family History Family History  Problem Relation Age of Onset   CAD Son    Skin cancer Brother     Social History Social History   Tobacco Use   Smoking status: Former Smoker    Types: Cigarettes   Smokeless tobacco: Former NeurosurgeonUser  Substance Use Topics   Alcohol use: No   Drug use: No     Allergies   Patient has no known allergies.   Review of Systems Review of Systems  Unable to perform ROS: Dementia     Physical Exam Updated Vital Signs BP (!) 157/72 (BP Location: Right Arm)    Pulse 85    Temp (!) 103.1 F (39.5 C) (Rectal)    Resp (!) 24    SpO2 95%   Physical Exam Vitals signs and nursing note reviewed.  Constitutional:      Appearance: He is well-developed.     Comments: Patient seems slightly anxious.  When asked orientation questions, he states "I do not know."  He is unable to tell me if his abdomen hurts or if he is having any other pain.  HENT:     Head: Normocephalic and atraumatic.  Eyes:     Conjunctiva/sclera: Conjunctivae normal.     Pupils: Pupils are equal,  round, and reactive to light.  Neck:     Musculoskeletal: Normal range of motion and neck supple.  Cardiovascular:     Rate and Rhythm: Regular rhythm. Tachycardia present.  Pulmonary:     Effort: Pulmonary effort is normal. No respiratory distress.     Breath sounds: Normal breath sounds.     Comments: Patient does have wet sounding cough. Abdominal:     General: Abdomen is flat. There is no distension.     Palpations: Abdomen is soft.     Tenderness: There is no guarding.     Comments: No obvious tenderness to abdomen.  Musculoskeletal:     Right lower leg: No edema.     Left lower leg: No edema.  Skin:    General: Skin is warm.  Neurological:     Mental Status: He is alert.     Comments: Spontaneously moving all extremities.  Psychiatric:        Behavior: Behavior normal.      ED Treatments / Results  Labs (all labs ordered are listed, but only abnormal results are displayed) Labs Reviewed  CBC WITH DIFFERENTIAL/PLATELET - Abnormal; Notable for the following components:      Result Value   WBC 22.9 (*)    Neutro Abs 21.0 (*)    Abs Immature Granulocytes 0.12 (*)    All other components within normal limits  COMPREHENSIVE METABOLIC PANEL - Abnormal; Notable for the following components:   Glucose, Bld 139 (*)    All other components within normal limits  CULTURE, BLOOD (ROUTINE X 2)  CULTURE, BLOOD (ROUTINE X 2)  SARS CORONAVIRUS 2 (HOSPITAL ORDER, PERFORMED IN [facility])  TROPONIN I (HIGH SENSITIVITY)  LIPASE, BLOOD  URINALYSIS, ROUTINE W REFLEX MICROSCOPIC  TROPONIN I (HIGH SENSITIVITY)  LACTIC ACID, PLASMA    EKG None  Radiology Dg Chest 2 View  Result Date: 09/29/2018 CLINICAL DATA:  Productive cough, shortness of breath. EXAM: CHEST - 2 VIEW COMPARISON:  Radiograph of September 17, 2018. FINDINGS: The heart size and mediastinal contours are within normal limits. Both lungs are clear. No pneumothorax or pleural effusion is noted. The visualized  skeletal structures are unremarkable. IMPRESSION: No active cardiopulmonary disease. Electronically Signed   By: Lupita RaiderJames  Green Jr M.D.   On: 09/29/2018 15:04    Procedures Procedures (including critical care time)  CRITICAL CARE Performed by: [place] N Shanyn Preisler   Total critical care time: 35 minutes  Critical care time was exclusive of separately billable procedures and treating other patients.  Critical care was necessary to treat or prevent imminent or life-threatening deterioration.  Critical care was time spent personally by me on the following activities: development of treatment plan with patient and/or surrogate as well as nursing, discussions with consultants, evaluation of patient's response to treatment, examination of patient, obtaining history from patient or surrogate, ordering and performing treatments and interventions, ordering and review of laboratory studies, ordering and review of radiographic studies, pulse oximetry and re-evaluation of patient's condition.  Medications Ordered in ED Medications  ceFEPIme (MAXIPIME) 2 g in sodium chloride 0.9 % 100 mL IVPB (has no administration in time range)  metroNIDAZOLE (FLAGYL) IVPB 500 mg (has no administration in time range)  vancomycin (VANCOCIN) 1,500 mg in sodium chloride 0.9 % 500 mL IVPB (1,500 mg Intravenous New Bag/Given 09/29/18 1700)  LORazepam (ATIVAN) injection 0.5 mg (0.5 mg Intravenous Given 09/29/18 1605)  sodium chloride 0.9 % bolus 1,000 mL (1,000 mLs Intravenous New Bag/Given 09/29/18 1624)  acetaminophen (TYLENOL) suppository 650 mg (650 mg Rectal Given 09/29/18 1624)    [name] was evaluated in Emergency Department on 09/29/2018 for the symptoms described in the history of present illness. He was evaluated in the context of the global COVID-19 pandemic, which necessitated consideration that the patient might be at risk for infection with the SARS-CoV-2 virus that causes COVID-19. Institutional protocols and  algorithms that pertain to the evaluation of patients at risk for COVID-19 are in a state of rapid change based on information released by regulatory bodies including the CDC and federal and state organizations. These policies and algorithms were followed during the patient's care in the ED.  Initial Impression / Assessment and Plan / ED Course  I have reviewed the triage vital signs and the nursing notes.  Pertinent labs & imaging results that were available during my care of the patient were reviewed by me and considered in my medical decision making (see chart for details).        Patient brought in by family with complaint of abdominal pain and lower chest pain today.  He episodes of vomiting as well.  Patient is at baseline per family with dementia.  On exam, abdomen is soft there is no obvious tenderness or guarding.  Patient has productive sounding cough.  He is tachycardic, however is anxious and disoriented.  No lower extremity swelling.  Spontaneously moving all extremities.  Will get labs consisting of CBC, CMP, lipase, urine, troponin, COVID test, blood cultures, chest x-ray.  Chest x-ray is negative.  Leukocytosis of 22.9.  Patient is now febrile 103.1, however blood pressure is stable, HR normalized after small dose of ativan for agitation.  X-ray is negative for infiltrate.  COVID test is pending.  Will administer Tylenol, IV fluids, broad-spectrum antibiotics, obtain lactic acid.  CT abdomen pelvis is pending.  Care [facility] shift [facility], pending CT results. Pt will require admission.   Pt discussed with Dr. Rubin PayorPickering.  Final Clinical Impressions(s) / ED Diagnoses   Final diagnoses:  None    ED Discharge Orders    None       Roxan HockeyRobinson, [place]  N, PA-C 09/29/18 1711    Benjiman CorePickering, Nathan, MD 09/29/18 2225

## 2018-09-30 ENCOUNTER — Encounter (HOSPITAL_COMMUNITY): Payer: Self-pay

## 2018-09-30 ENCOUNTER — Inpatient Hospital Stay (HOSPITAL_COMMUNITY): Payer: Medicare Other

## 2018-09-30 LAB — APTT: aPTT: 40 seconds — ABNORMAL HIGH (ref 24–36)

## 2018-09-30 LAB — GLUCOSE, CAPILLARY
Glucose-Capillary: 95 mg/dL (ref 70–99)
Glucose-Capillary: 116 mg/dL — ABNORMAL HIGH (ref 70–99)
Glucose-Capillary: 119 mg/dL — ABNORMAL HIGH (ref 70–99)
Glucose-Capillary: 91 mg/dL (ref 70–99)
Glucose-Capillary: 92 mg/dL (ref 70–99)

## 2018-09-30 LAB — CBC
HCT: 35.1 % — ABNORMAL LOW (ref 39.0–52.0)
HCT: 34.7 % — ABNORMAL LOW (ref 39.0–52.0)
Hemoglobin: 11.1 g/dL — ABNORMAL LOW (ref 13.0–17.0)
Hemoglobin: 11.1 g/dL — ABNORMAL LOW (ref 13.0–17.0)
MCH: 28.3 pg (ref 26.0–34.0)
MCH: 29.1 pg (ref 26.0–34.0)
MCHC: 31.6 g/dL (ref 30.0–36.0)
MCHC: 32 g/dL (ref 30.0–36.0)
MCV: 89.5 fL (ref 80.0–100.0)
MCV: 91.1 fL (ref 80.0–100.0)
Platelets: 163 10*3/uL (ref 150–400)
Platelets: 163 10*3/uL (ref 150–400)
RBC: 3.92 MIL/uL — ABNORMAL LOW (ref 4.22–5.81)
RBC: 3.81 MIL/uL — ABNORMAL LOW (ref 4.22–5.81)
RDW: 14.1 % (ref 11.5–15.5)
RDW: 14.3 % (ref 11.5–15.5)
WBC: 29.1 10*3/uL — ABNORMAL HIGH (ref 4.0–10.5)
WBC: 25.6 10*3/uL — ABNORMAL HIGH (ref 4.0–10.5)
nRBC: 0 % (ref 0.0–0.2)
nRBC: 0 % (ref 0.0–0.2)

## 2018-09-30 LAB — MRSA PCR SCREENING: MRSA by PCR: NEGATIVE

## 2018-09-30 LAB — TSH: TSH: 1.023 u[IU]/mL (ref 0.350–4.500)

## 2018-09-30 LAB — COMPREHENSIVE METABOLIC PANEL
ALT: 19 U/L (ref 0–44)
AST: 31 U/L (ref 15–41)
Albumin: 3.3 g/dL — ABNORMAL LOW (ref 3.5–5.0)
Alkaline Phosphatase: 41 U/L (ref 38–126)
Anion gap: 10 (ref 5–15)
BUN: 16 mg/dL (ref 8–23)
CO2: 20 mmol/L — ABNORMAL LOW (ref 22–32)
Calcium: 8.4 mg/dL — ABNORMAL LOW (ref 8.9–10.3)
Chloride: 108 mmol/L (ref 98–111)
Creatinine, Ser: 0.92 mg/dL (ref 0.61–1.24)
GFR calc Af Amer: >60 mL/min (ref >60)
GFR calc non Af Amer: >60 mL/min (ref >60)
Glucose, Bld: 100 mg/dL — ABNORMAL HIGH (ref 70–99)
Potassium: 4.3 mmol/L (ref 3.5–5.1)
Sodium: 138 mmol/L (ref 135–145)
Total Bilirubin: 0.8 mg/dL (ref 0.3–1.2)
Total Protein: 5.6 g/dL — ABNORMAL LOW (ref 6.5–8.1)

## 2018-09-30 LAB — PROCALCITONIN: Procalcitonin: 2.36 ng/mL

## 2018-09-30 LAB — CREATININE, SERUM
Creatinine, Ser: 0.92 mg/dL (ref 0.61–1.24)
GFR calc Af Amer: >60 mL/min (ref >60)
GFR calc non Af Amer: >60 mL/min (ref >60)

## 2018-09-30 LAB — LACTIC ACID, PLASMA
Lactic Acid, Venous: 1.7 mmol/L (ref 0.5–1.9)
Lactic Acid, Venous: 2.5 mmol/L (ref 0.5–1.9)

## 2018-09-30 LAB — TROPONIN I (HIGH SENSITIVITY)
Troponin I (High Sensitivity): 79 ng/L — ABNORMAL HIGH (ref <18)
Troponin I (High Sensitivity): 95 ng/L — ABNORMAL HIGH (ref <18)

## 2018-09-30 LAB — HEMOGLOBIN A1C
Hgb A1c MFr Bld: 7.2 % — ABNORMAL HIGH (ref 4.8–5.6)
Mean Plasma Glucose: 159.94 mg/dL

## 2018-09-30 LAB — PROTIME-INR
INR: 1.3 — ABNORMAL HIGH (ref 0.8–1.2)
Prothrombin Time: 16.2 seconds — ABNORMAL HIGH (ref 11.4–15.2)

## 2018-09-30 LAB — MAGNESIUM: Magnesium: 1.3 mg/dL — ABNORMAL LOW (ref 1.7–2.4)

## 2018-09-30 MED ORDER — IOHEXOL 300 MG/ML  SOLN: 100.0000 mL | Freq: Once | INTRAMUSCULAR | Status: DC | PRN

## 2018-09-30 MED ORDER — FINASTERIDE 5 MG PO TABS
5.0000 mg | ORAL_TABLET | Freq: Every day | ORAL | Status: DC
  Administered 2018-10-02 – 2018-10-04 (×3): 5 mg via ORAL
  Filled 2018-09-30 (×3): qty 1

## 2018-09-30 MED ORDER — MORPHINE SULFATE (PF) 2 MG/ML IV SOLN
2.0000 mg | INTRAVENOUS | Status: DC | PRN
  Administered 2018-09-30 – 2018-10-04 (×13): 2 mg via INTRAVENOUS
  Filled 2018-09-30 (×13): qty 1

## 2018-09-30 MED ORDER — METRONIDAZOLE IN NACL 5-0.79 MG/ML-% IV SOLN
500.0000 mg | Freq: Three times a day (TID) | INTRAVENOUS | Status: DC
  Administered 2018-09-30 – 2018-10-04 (×15): 500 mg via INTRAVENOUS
  Filled 2018-09-30 (×15): qty 100

## 2018-09-30 MED ORDER — ONDANSETRON HCL 4 MG PO TABS: 4.0000 mg | ORAL_TABLET | Freq: Four times a day (QID) | ORAL | Status: DC | PRN

## 2018-09-30 MED ORDER — OLOPATADINE HCL 0.1 % OP SOLN
1.0000 [drp] | Freq: Two times a day (BID) | OPHTHALMIC | Status: DC
  Administered 2018-09-30 – 2018-10-04 (×10): 1 [drp] via OPHTHALMIC
  Filled 2018-09-30: qty 5

## 2018-09-30 MED ORDER — LACTATED RINGERS IV BOLUS
1000.0000 mL | Freq: Once | INTRAVENOUS | Status: AC
  Administered 2018-09-30: 01:00:00 1000 mL via INTRAVENOUS

## 2018-09-30 MED ORDER — SODIUM CHLORIDE 0.9 % IV BOLUS
250.0000 mL | Freq: Once | INTRAVENOUS | Status: AC
  Administered 2018-09-30: 13:00:00 250 mL via INTRAVENOUS

## 2018-09-30 MED ORDER — IPRATROPIUM-ALBUTEROL 0.5-2.5 (3) MG/3ML IN SOLN: 3.0000 mL | Freq: Four times a day (QID) | RESPIRATORY_TRACT | Status: DC

## 2018-09-30 MED ORDER — BRIMONIDINE TARTRATE 0.15 % OP SOLN
1.0000 [drp] | Freq: Two times a day (BID) | OPHTHALMIC | Status: DC
  Administered 2018-09-30 – 2018-10-04 (×10): 1 [drp] via OPHTHALMIC
  Filled 2018-09-30: qty 5

## 2018-09-30 MED ORDER — INSULIN ASPART 100 UNIT/ML ~~LOC~~ SOLN
0.0000 [IU] | Freq: Three times a day (TID) | SUBCUTANEOUS | Status: DC
  Administered 2018-10-01 – 2018-10-03 (×3): 1 [IU] via SUBCUTANEOUS

## 2018-09-30 MED ORDER — INSULIN ASPART 100 UNIT/ML ~~LOC~~ SOLN: 0.0000 [IU] | Freq: Every day | SUBCUTANEOUS | Status: DC

## 2018-09-30 MED ORDER — HALOPERIDOL LACTATE 5 MG/ML IJ SOLN
5.0000 mg | Freq: Once | INTRAMUSCULAR | Status: AC
  Administered 2018-09-30: 5 mg via INTRAVENOUS
  Filled 2018-09-30: qty 1

## 2018-09-30 MED ORDER — MAGNESIUM SULFATE 2 GM/50ML IV SOLN
2.0000 g | Freq: Once | INTRAVENOUS | Status: AC
  Administered 2018-09-30: 15:00:00 2 g via INTRAVENOUS
  Filled 2018-09-30 (×2): qty 50

## 2018-09-30 MED ORDER — ACETAMINOPHEN 650 MG RE SUPP
650.0000 mg | Freq: Four times a day (QID) | RECTAL | Status: DC | PRN
  Administered 2018-09-30 (×2): 650 mg via RECTAL
  Filled 2018-09-30 (×3): qty 1

## 2018-09-30 MED ORDER — HALOPERIDOL LACTATE 5 MG/ML IJ SOLN
1.0000 mg | Freq: Once | INTRAMUSCULAR | Status: AC
  Administered 2018-09-30: 23:00:00 1 mg via INTRAVENOUS
  Filled 2018-09-30: qty 1

## 2018-09-30 MED ORDER — LORAZEPAM 2 MG/ML IJ SOLN
1.0000 mg | Freq: Once | INTRAMUSCULAR | Status: DC
  Filled 2018-09-30: qty 1

## 2018-09-30 MED ORDER — OXYCODONE HCL 5 MG PO TABS: 5.0000 mg | ORAL_TABLET | ORAL | Status: DC | PRN

## 2018-09-30 MED ORDER — ATORVASTATIN CALCIUM 40 MG PO TABS
80.0000 mg | ORAL_TABLET | Freq: Every day | ORAL | Status: DC
  Administered 2018-10-02: 18:00:00 80 mg via ORAL
  Filled 2018-09-30: qty 2

## 2018-09-30 MED ORDER — PANTOPRAZOLE SODIUM 40 MG PO TBEC
40.0000 mg | DELAYED_RELEASE_TABLET | Freq: Every day | ORAL | Status: DC
  Administered 2018-10-02 – 2018-10-04 (×3): 40 mg via ORAL
  Filled 2018-09-30 (×3): qty 1

## 2018-09-30 MED ORDER — LEVALBUTEROL HCL 0.63 MG/3ML IN NEBU
0.6300 mg | INHALATION_SOLUTION | Freq: Two times a day (BID) | RESPIRATORY_TRACT | Status: DC
  Administered 2018-09-30 – 2018-10-01 (×3): 0.63 mg via RESPIRATORY_TRACT
  Filled 2018-09-30 (×3): qty 3

## 2018-09-30 MED ORDER — BISACODYL 10 MG RE SUPP: 10.0000 mg | Freq: Every day | RECTAL | Status: DC | PRN

## 2018-09-30 MED ORDER — POLYVINYL ALCOHOL 1.4 % OP SOLN
1.0000 [drp] | Freq: Four times a day (QID) | OPHTHALMIC | Status: DC
  Administered 2018-09-30 – 2018-10-04 (×16): 1 [drp] via OPHTHALMIC
  Filled 2018-09-30: qty 15

## 2018-09-30 MED ORDER — HYDRALAZINE HCL 20 MG/ML IJ SOLN
5.0000 mg | Freq: Once | INTRAMUSCULAR | Status: AC
  Administered 2018-09-30: 20:00:00 5 mg via INTRAVENOUS
  Filled 2018-09-30: qty 1

## 2018-09-30 MED ORDER — ORAL CARE MOUTH RINSE
15.0000 mL | Freq: Two times a day (BID) | OROMUCOSAL | Status: DC
  Administered 2018-09-30 – 2018-10-04 (×9): 15 mL via OROMUCOSAL

## 2018-09-30 MED ORDER — METOPROLOL TARTRATE 25 MG PO TABS: 25.0000 mg | ORAL_TABLET | Freq: Two times a day (BID) | ORAL | Status: DC

## 2018-09-30 MED ORDER — POLYVINYL ALCOHOL 1.4 % OP SOLN
2.0000 [drp] | Freq: Four times a day (QID) | OPHTHALMIC | Status: DC | PRN
  Filled 2018-09-30: qty 15

## 2018-09-30 MED ORDER — HYDRALAZINE HCL 20 MG/ML IJ SOLN
5.0000 mg | Freq: Once | INTRAMUSCULAR | Status: AC
  Administered 2018-09-30: 5 mg via INTRAVENOUS
  Filled 2018-09-30: qty 1

## 2018-09-30 MED ORDER — LACTATED RINGERS IV SOLN
INTRAVENOUS | Status: DC
  Administered 2018-09-30: 05:00:00 via INTRAVENOUS

## 2018-09-30 MED ORDER — LEVOTHYROXINE SODIUM 50 MCG PO TABS
50.0000 ug | ORAL_TABLET | Freq: Every day | ORAL | Status: DC
  Administered 2018-10-02: 50 ug via ORAL
  Filled 2018-09-30 (×3): qty 1

## 2018-09-30 MED ORDER — LEVALBUTEROL HCL 0.63 MG/3ML IN NEBU
INHALATION_SOLUTION | RESPIRATORY_TRACT | Status: AC
  Administered 2018-09-30: 15:00:00 0.63 mg
  Filled 2018-09-30: qty 3

## 2018-09-30 MED ORDER — TAMSULOSIN HCL 0.4 MG PO CAPS
0.4000 mg | ORAL_CAPSULE | Freq: Every day | ORAL | Status: DC
  Administered 2018-10-02: 10:00:00 0.4 mg via ORAL
  Filled 2018-09-30 (×2): qty 1

## 2018-09-30 MED ORDER — ACETAMINOPHEN 325 MG PO TABS: 650.0000 mg | ORAL_TABLET | Freq: Four times a day (QID) | ORAL | Status: DC | PRN

## 2018-09-30 MED ORDER — FUROSEMIDE 20 MG PO TABS: 20.0000 mg | ORAL_TABLET | Freq: Every day | ORAL | Status: DC

## 2018-09-30 MED ORDER — VANCOMYCIN HCL IN DEXTROSE 1-5 GM/200ML-% IV SOLN: 1000.0000 mg | Freq: Once | INTRAVENOUS | Status: DC

## 2018-09-30 MED ORDER — LORAZEPAM 2 MG/ML IJ SOLN
1.0000 mg | Freq: Once | INTRAMUSCULAR | Status: AC
  Administered 2018-09-30: 10:00:00 1 mg via INTRAVENOUS
  Filled 2018-09-30: qty 1

## 2018-09-30 MED ORDER — ONDANSETRON HCL 4 MG/2ML IJ SOLN: 4.0000 mg | Freq: Four times a day (QID) | INTRAMUSCULAR | Status: DC | PRN

## 2018-09-30 MED ORDER — LORAZEPAM 2 MG/ML IJ SOLN
0.5000 mg | Freq: Four times a day (QID) | INTRAMUSCULAR | Status: DC | PRN
  Administered 2018-09-30 – 2018-10-03 (×8): 0.5 mg via INTRAVENOUS
  Filled 2018-09-30 (×9): qty 1

## 2018-09-30 MED ORDER — HEPARIN SODIUM (PORCINE) 5000 UNIT/ML IJ SOLN
5000.0000 [IU] | Freq: Three times a day (TID) | INTRAMUSCULAR | Status: DC
  Administered 2018-09-30 – 2018-10-03 (×11): 5000 [IU] via SUBCUTANEOUS
  Filled 2018-09-30 (×12): qty 1

## 2018-09-30 MED ORDER — TECHNETIUM TC 99M MEBROFENIN IV KIT
5.1000 | PACK | Freq: Once | INTRAVENOUS | Status: AC | PRN
  Administered 2018-09-30: 5.1 via INTRAVENOUS

## 2018-09-30 MED ORDER — METOPROLOL TARTRATE 5 MG/5ML IV SOLN
2.5000 mg | Freq: Three times a day (TID) | INTRAVENOUS | Status: DC
  Administered 2018-09-30 – 2018-10-01 (×3): 2.5 mg via INTRAVENOUS
  Filled 2018-09-30 (×3): qty 5

## 2018-09-30 MED ORDER — SENNOSIDES-DOCUSATE SODIUM 8.6-50 MG PO TABS: 1.0000 | ORAL_TABLET | Freq: Every evening | ORAL | Status: DC | PRN

## 2018-09-30 NOTE — Progress Notes (Signed)
SLP Cancellation Note  Patient Details Name: JACSON RAPAPORT MRN: [id number] DOB: 10-Jul-1927   Cancelled treatment:       Reason Eval/Treat Not Completed: Patient not medically ready. Patient agitated, confused. He was recently a patient at [facility] and during that time, exhibited increasing confusion and agitation, refusing PO's.   [name] 09/30/2018, 9:31 AM    Sonia Baller, MA, CCC-SLP Speech Therapy [facility] Pager: [phone]

## 2018-09-30 NOTE — Progress Notes (Signed)
CRITICAL VALUE ALERT  Critical Value:  LA 2.5  Date & Time Notied:  09/30/18 0700  Provider Notified: Dr. Eric [place]   Orders Received/Actions taken: Awaiting new orders.

## 2018-09-30 NOTE — Progress Notes (Signed)
Notified Dr. Tyrell Antonio and Dr. Dema Severin of concerns with patient not being able to tolerate HIDA scan this morning due to agitation. Will monitor and await orders.

## 2018-09-30 NOTE — Progress Notes (Signed)
PROGRESS NOTE    [name]  ZOX:[id number] DOB: 03/16/1928 DOA: 09/29/2018 PCP: Bing NeighborsHarris, Kimberly S, FNP   Brief Narrative: 85 year old with past medical history significant for presumed dementia, A. fib, COPD, CVA, type 2 diabetes, hypertension, hearing impairment, hypothyroidism, coronary artery disease and glaucoma resents from home with progressive confusion.  Patient is unable to participate in HPI due to altered mental status.  Per family patient has been complaining of abdominal pain and low back pain over the last couple of days.  Evaluation in the ED temperature 103, heart rate 115, respiration rate 34, blood pressure 115/44 oxygen sat 92 on room air.  White blood cell 22 hemoglobin 13.  Chest x-ray no acute cardiopulmonary disease [name] test negative.  CT abdomen and pelvis notable for possible distended gallbladder.  Right upper quadrant ultrasound noted left lobe liver cyst no gallstones and minimal pericholecystic fluid noted.  Patient was admitted with sepsis of unclear etiology, acute metabolic and toxic encephalopathy.  Troponins has been increasing.  Discussed with cardiology on this level of troponin are likely related to sepsis.  General surgery was consulted and they are recommending HIDA scan, if positive patient will require percutaneous cholecystostomy.    Assessment & Plan:   Active Problems:   Diabetes mellitus type II, uncontrolled (HCC)   Hypothyroidism   BPH (benign prostatic hyperplasia)   HTN (hypertension)   Atrial fibrillation (HCC)   Altered mental status   Sepsis (HCC)  1-Severe Sepsis;  Unclear etiology.  Patient presented with fever, tachycardia, leukocytosis. Chest x ray; negative.. Follow Blood culture.  CT abdomen; possible distended gallbladder.  US; mild pericholecystic fluid.  HID scan Pending.  Continue with IV fluids, IV Cefepime and vancomycin.  CT lumbar spine order.  2-Acute Metabolic Encephalopathy;  Related to acute  illness PRN Ativan Treating acute infection Liver function test normal.  Elevation troponin: Likely demand to ischemia, secondary to sepsis. Discussed with cardiology. Supportive care, treat underlying sepsis physiology.  History of persistent A. Fib: Apparently was prescribed Eliquis but has not been taking it due to cost. On metoprolol p.o. twice daily, changed to IV now the patient is unable to tolerate orals  History of COPD: Nebulizer as needed.  Hypertension: Hold amlodipine and lisinopril.  History of CVA: Continue with atorvastatin  Hypothyroidism: Continue with Synthroid  Glaucoma; continue with home eyedrops.  Diabetes type 2: On metformin while inpatient. Continue with a sliding scale insulin.  Hypomagnesemia Replete IV mag  Estimated body mass index is 23.06 kg/m as calculated from the following:   Height as of this encounter: 6' (1.829 m).   Weight as of this encounter: 77.1 kg.   DVT prophylaxis: Heparin Code Status: DNR Family Communication: Will update family Disposition Plan: Remain in the step down unit, treatment of encephalopathy, sepsis.  Consultants:   Surgery.   Procedures:   HIDA Scan; US; No gallstones or gallbladder wall thickening. Minimal pericholecystic fluid is noted. Ultrasound technologist could not cyst sonographic Murphy sign because the patient was unresponsive. Liver cyst.  Antimicrobials:    Subjective: Non responsive, moaning, moves extremities, keeps eyes close.  Agitated at time, per staff.   Objective: Vitals:   09/30/18 0454 09/30/18 0500 09/30/18 0600 09/30/18 0625  BP:  (!) 140/112 137/62   Pulse:  (!) 110 (!) 102   Resp:  (!) 23 (!) 41   Temp: (!) 101.7 F (38.7 C)     TempSrc: Axillary     SpO2:  91% 96% 92%  Weight:  Height:        Intake/Output Summary (Last 24 hours) at 09/30/2018 0826 Last data filed at 09/30/2018 [id number] Gross per 24 hour  Intake 1715.77 ml  Output 200 ml  Net 1515.77 ml    Filed Weights   09/29/18 1935  Weight: 77.1 kg    Examination:  General exam: NAD Respiratory system: Clear to auscultation. Respiratory effort normal. Cardiovascular system: S1 & S2 heard, RRR. No JVD, murmurs, rubs, gallops or clicks. No pedal edema. Gastrointestinal system: Abdomen is nondistended, soft and nontender. No organomegaly or masses felt. Normal bowel sounds heard. Central nervous system: non responsive, moves extremities passively, moaning.  Extremities: Symmetric 5 x 5 power. Skin: No rashes, lesions or ulcers Psychiatry: unable to assess.   Data Reviewed: I have personally reviewed following labs and imaging studies  CBC: Recent Labs  Lab 09/29/18 1543 09/30/18 0211  WBC 22.9* 29.1*  NEUTROABS 21.0*  --   HGB 13.0 11.1*  HCT 41.8 35.1*  MCV 89.1 89.5  PLT 184 163   Basic Metabolic Panel: Recent Labs  Lab 09/29/18 1543 09/30/18 0211 09/30/18 0618  NA 136  --  138  K 4.0  --  4.3  CL 101  --  108  CO2 22  --  20*  GLUCOSE 139*  --  100*  BUN 14  --  16  CREATININE 0.83 0.92 0.92  CALCIUM 9.2  --  8.4*   GFR: Estimated Creatinine Clearance: 59.4 mL/min (by C-G formula based on SCr of 0.92 mg/dL). Liver Function Tests: Recent Labs  Lab 09/29/18 1543 09/30/18 0618  AST 23 31  ALT 21 19  ALKPHOS 60 41  BILITOT 0.8 0.8  PROT 7.0 5.6*  ALBUMIN 4.2 3.3*   Recent Labs  Lab 09/29/18 1543  LIPASE 32   No results for input(s): AMMONIA in the last 168 hours. Coagulation Profile: Recent Labs  Lab 09/30/18 0211  INR 1.3*   Cardiac Enzymes: No results for input(s): CKTOTAL, CKMB, CKMBINDEX, TROPONINI in the last 168 hours. BNP (last 3 results) No results for input(s): PROBNP in the last 8760 hours. HbA1C: Recent Labs    09/30/18 0211  HGBA1C 7.2*   CBG: No results for input(s): GLUCAP in the last 168 hours. Lipid Profile: No results for input(s): CHOL, HDL, LDLCALC, TRIG, CHOLHDL, LDLDIRECT in the last 72 hours. Thyroid Function  Tests: Recent Labs    09/30/18 0211  TSH 1.023   Anemia Panel: No results for input(s): VITAMINB12, FOLATE, FERRITIN, TIBC, IRON, RETICCTPCT in the last 72 hours. Sepsis Labs: Recent Labs  Lab 09/29/18 1700 09/30/18 0211 09/30/18 0618  PROCALCITON  --  2.36  --   LATICACIDVEN 1.8 1.7 2.5*    Recent Results (from the past 240 hour(s))  SARS Coronavirus 2 (CEPHEID- Performed in [facility] hospital lab), Hosp Order     Status: None   Collection Time: 09/29/18  3:44 PM   Specimen: Nasopharyngeal Swab  Result Value Ref Range Status   SARS Coronavirus 2 NEGATIVE NEGATIVE Final    Comment: (NOTE) If result is NEGATIVE SARS-CoV-2 target nucleic acids are NOT DETECTED. The SARS-CoV-2 RNA is generally detectable in upper and lower  respiratory specimens during the acute phase of infection. The lowest  concentration of SARS-CoV-2 viral copies this assay can detect is 250  copies / mL. A negative result does not preclude SARS-CoV-2 infection  and should not be used as the sole basis for treatment or other  patient management decisions.  A negative  result may occur with  improper specimen collection / handling, submission of specimen other  than nasopharyngeal swab, presence of viral mutation(s) within the  areas targeted by this assay, and inadequate number of viral copies  (<250 copies / mL). A negative result must be combined with clinical  observations, patient history, and epidemiological information. If result is POSITIVE SARS-CoV-2 target nucleic acids are DETECTED. The SARS-CoV-2 RNA is generally detectable in upper and lower  respiratory specimens dur ing the acute phase of infection.  Positive  results are indicative of active infection with SARS-CoV-2.  Clinical  correlation with patient history and other diagnostic information is  necessary to determine patient infection status.  Positive results do  not rule out bacterial infection or co-infection with other  viruses. If result is PRESUMPTIVE POSTIVE SARS-CoV-2 nucleic acids MAY BE PRESENT.   A presumptive positive result was obtained on the submitted specimen  and confirmed on repeat testing.  While 2019 novel coronavirus  (SARS-CoV-2) nucleic acids may be present in the submitted sample  additional confirmatory testing may be necessary for epidemiological  and / or clinical management purposes  to differentiate between  SARS-CoV-2 and other Sarbecovirus currently known to infect humans.  If clinically indicated additional testing with an alternate test  methodology [phone]) is advised. The SARS-CoV-2 RNA is generally  detectable in upper and lower respiratory sp ecimens during the acute  phase of infection. The expected result is Negative. Fact Sheet for Patients:  StrictlyIdeas.no Fact Sheet for Healthcare Providers: BankingDealers.co.za This test is not yet approved or cleared by the [place] FDA and has been authorized for detection and/or diagnosis of SARS-CoV-2 by FDA under an Emergency Use Authorization (EUA).  This EUA will remain in effect (meaning this test can be used) for the duration of the COVID-19 declaration under Section 564(b)(1) of the Act, 21 U.S.C. section 360bbb-3(b)(1), unless the authorization is terminated or revoked sooner. Performed at [facility], [place] [address], [place], [place] [address]   MRSA PCR Screening     Status: None   Collection Time: 09/30/18 12:22 AM   Specimen: Nasal Mucosa; Nasopharyngeal  Result Value Ref Range Status   MRSA by PCR NEGATIVE NEGATIVE Final    Comment:        The GeneXpert MRSA Assay (FDA approved for NASAL specimens only), is one component of a comprehensive MRSA colonization surveillance program. It is not intended to diagnose MRSA infection nor to guide or monitor treatment for MRSA infections. Performed at [facility], [place]  [address] [place], [place] [address]          Radiology Studies: Dg Chest 2 View  Result Date: 09/29/2018 CLINICAL DATA:  Productive cough, shortness of breath. EXAM: CHEST - 2 VIEW COMPARISON:  Radiograph of September 17, 2018. FINDINGS: The heart size and mediastinal contours are within normal limits. Both lungs are clear. No pneumothorax or pleural effusion is noted. The visualized skeletal structures are unremarkable. IMPRESSION: No active cardiopulmonary disease. Electronically Signed   By: Marijo Conception M.D.   On: 09/29/2018 15:04   Ct Abdomen Pelvis W Contrast  Result Date: 09/29/2018 CLINICAL DATA:  85 year old male with fever of 103, altered mental status. Abdominal pain. EXAM: CT ABDOMEN AND PELVIS WITH CONTRAST TECHNIQUE: Multidetector CT imaging of the abdomen and pelvis was performed using the standard protocol following bolus administration of intravenous contrast. CONTRAST:  127mL OMNIPAQUE IOHEXOL 300 MG/ML  SOLN COMPARISON:  CT Abdomen and Pelvis 10/28/2014. chest radiographs  earlier today. FINDINGS: Lower chest: Respiratory motion. No pericardial or pleural effusion. No lung base consolidation. Chronic mild cardiomegaly and small to moderate hiatal hernia. Hepatobiliary: Motion artifact, but the liver and bile ducts appear stable since 2016. There is a small chronic left hepatic lobe cyst. The gallbladder is more distended today, but there is no definite pericholecystic inflammation when allowing for motion. Right upper quadrant ultrasound would be valuable to confirm. Pancreas: Stable atrophy. Spleen: Negative. Adrenals/Urinary Tract: Normal adrenal glands. Symmetric renal enhancement and contrast excretion with no hydronephrosis. Decompressed and normal proximal ureters. Unremarkable urinary bladder. Stomach/Bowel: Mild chronic sigmoid diverticulosis. Mild retained stool in the distal colon. Decompressed descending colon and splenic flexure. Mild retained stool in the transverse and  ascending colon. Negative cecum and appendix (series 2, image 52). Motion artifact, but no convincing large bowel inflammation. Negative terminal ileum. No dilated small bowel. Chronic small bowel containing left inguinal hernia appears stable since 2016 and non incarcerated. Chronic hiatal hernia. Negative intra-abdominal stomach. No free air or free fluid. Vascular/Lymphatic: Aortoiliac calcified atherosclerosis. Ectatic to borderline aneurysmal infrarenal abdominal aorta (30 millimeters diameter) has increased by 1 millimeter since the prior CT. Major arterial structures appear patent. Portal venous system appears patent. No lymphadenopathy. Reproductive: Chronic left inguinal hernia containing a small bowel loop (series 2, image 72) appears similar to that in 2016, with no active inflammation identified. Other: No pelvic free fluid. Musculoskeletal: Motion artifact. Osteopenia. No acute osseous abnormality identified. IMPRESSION: 1. Distended gallbladder today, and motion artifact rendering determination of pericholecystic inflammation difficult. Recommend follow-up Right Upper Quadrant Ultrasound. 2. Otherwise no acute or inflammatory process identified in the abdomen or pelvis. 3. Chronic small bowel containing left inguinal hernia appears stable since 2016 and non incarcerated. Normal appendix. Negative visible lung bases. 4. Aortic Atherosclerosis (ICD10-I70.0). Ectatic to mildly aneurysmal infrarenal aorta. Recommend followup by Ultrasound in 3 years. This recommendation follows ACR consensus guidelines: White Paper of the ACR Incidental Findings Committee II on Vascular Findings. J Am Coll Radiol 2013; 10:789-794. Aortic aneurysm NOS (ICD10-I71.9) Electronically Signed   By: Odessa Fleming M.D.   On: 09/29/2018 18:01   US Abdomen Limited Ruq  Result Date: 09/29/2018 CLINICAL DATA:  Abdominal tenderness today. EXAM: ULTRASOUND ABDOMEN LIMITED RIGHT UPPER QUADRANT COMPARISON:  None. FINDINGS: Gallbladder: No  gallstones or wall thickening visualized. Minimal pericholecystic fluid is identified. Ultrasound technologist could not assess sonographic Murphy sign because the patient was unresponsive. Common bile duct: Diameter: 4.5 mm Liver: There is a 1.3 x 1.2 x 1.1 cm cyst in the left lobe liver. Within normal limits in parenchymal echogenicity. Portal vein is patent on color Doppler imaging with normal direction of blood flow towards the liver. IMPRESSION: No gallstones or gallbladder wall thickening. Minimal pericholecystic fluid is noted. Ultrasound technologist could not cyst sonographic Murphy sign because the patient was unresponsive. Liver cyst. Electronically Signed   By: Sherian Rein M.D.   On: 09/29/2018 19:01        Scheduled Meds:  atorvastatin  80 mg Oral q1800   brimonidine  1 drop Both Eyes BID   finasteride  5 mg Oral Daily   furosemide  20 mg Oral Daily   heparin  5,000 Units Subcutaneous Q8H   insulin aspart  0-5 Units Subcutaneous QHS   insulin aspart  0-9 Units Subcutaneous TID WC   levothyroxine  50 mcg Oral Q0600   mouth rinse  15 mL Mouth Rinse BID   metoprolol tartrate  25 mg Oral BID  olopatadine  1 drop Both Eyes BID   pantoprazole  40 mg Oral Daily   polyvinyl alcohol  1 drop Both Eyes QID   tamsulosin  0.4 mg Oral Daily   Continuous Infusions:  ceFEPime (MAXIPIME) IV Stopped (09/30/18 0422)   lactated ringers 75 mL/hr at 09/30/18 0436   metronidazole 500 mg (09/30/18 0610)   vancomycin       LOS: 1 day    Time spent: 35 minutes    Alba CoryBelkys A Nerine Pulse, MD [facility] Pager [phone]  If 7PM-7AM, please contact night-coverage [email or web address] Password [email or web address] 09/30/2018, 8:26 AM

## 2018-09-30 NOTE — Progress Notes (Signed)
Son, [name] called an updated regarding patient's plan for HIDA today and events overnight.

## 2018-09-30 NOTE — Progress Notes (Signed)
Dr. Tyrell Antonio paged and updated of patient's vitals after Ativan administration. Also results of Mag 1.3 and Troponin. Awaiting orders.

## 2018-09-30 NOTE — TOC Initial Note (Signed)
Transition of Care [facility]) - Initial/Assessment Note    Patient Details  Name: [name] MRN: [id number] Date of Birth: 1927/07/17  Transition of [facility] CM/SW Contact:    Nila Nephew, LCSW Phone Number: [phone] 09/30/2018, 10:56 AM  Clinical Narrative:     Completed High Risk readmission screening below due to score. Pt is active with [facility] for home RN. Will need resumption orders at DC.          Admission diagnosis:  Abdominal tenderness, RUQ (right upper quadrant) [R10.811] Patient Active Problem List   Diagnosis Date Noted  . Sepsis ([place]) 09/29/2018  . Dysphasia 09/17/2018  . HCAP (healthcare-associated pneumonia) 09/15/2018  . Acute CHF (congestive heart failure) ([place]) 09/15/2018  . Enterococcus UTI 08/07/2018  . Neurocognitive deficits 08/07/2018  . Urinary tract infection with hematuria   . Malnutrition of moderate degree 07/31/2018  . AMS (altered mental status) 07/14/2018  . Altered mental status   . Stroke ([place]) 07/10/2018  . Requires supplemental oxygen 06/05/2018  . Chest pain 03/29/2018  . Atrial fibrillation ([place]) 05/20/2015  . RBBB 05/20/2015  . Prolonged Q-T interval on ECG 05/20/2015  . CAD (coronary artery disease) 05/20/2015  . History of CVA (cerebrovascular accident) 05/20/2015  . Atrial fibrillation with RVR ([place])   . Bronchiectasis with acute exacerbation ([place])   . Unstable angina ([place]) 03/27/2013  . COPD exacerbation ([place]) 03/25/2013  . Diabetes mellitus type II, uncontrolled ([place]) 03/25/2013  . Hypothyroidism 03/25/2013  . BPH (benign prostatic hyperplasia) 03/25/2013  . HTN (hypertension) 03/25/2013   PCP:  Scot Jun, FNP Pharmacy:   [facility] [phone] [place], [place] [address] [place] [address] Phone: [phone] Fax: [place], [place] - [place] [phone] [place] [address] Phone: [phone] Fax: [phone]     Social Determinants of Health (SDOH) Interventions    Readmission Risk Interventions Readmission Risk Prevention Plan 09/30/2018 09/18/2018 08/01/2018  Transportation Screening Complete Complete -  Medication Review (RN Care Manager) Complete Complete -  PCP or Specialist appointment within 3-5 days of discharge Not Complete Complete Complete  PCP/Specialist Appt Not Complete comments Pt has PCP and cardiologist however DC date unknown - -  [place] or Home Care Consult Complete Complete (No Data)  SW Recovery Care/Counseling Consult Complete Complete Complete  Palliative Care Screening Not Applicable Not Applicable Not Applicable  Skilled Nursing Facility Not Applicable Not Applicable Complete  Some recent data might be hidden

## 2018-09-30 NOTE — Progress Notes (Signed)
We have tried multiple times today to get this patient for his scan, most recently at 1700. Unable to lie still, unable to lie flat and AMS are reasons given for [place] to not be able to get this patient. Spoke with Gerald Stabs, RN @ 1700 who said that he didn't know when this patient would be able to come to CT.

## 2018-09-30 NOTE — Progress Notes (Addendum)
CRITICAL VALUE ALERT  Critical Value:  Troponin 79  Date & Time Notied:  09/30/18 0500  Provider Notified:  Dr. Eric [place]  Orders Received/Actions taken: Awaiting new orders.

## 2018-09-30 NOTE — Progress Notes (Signed)
Subjective No acute events; confused/dementia at baseline and therefore unable to participate with discussions.   Objective: Vital signs in last 24 hours: Temp:  [99.4 F (37.4 C)-103.1 F (39.5 C)] 101.7 F (38.7 C) (07/06 0454) Pulse Rate:  [83-115] 102 (07/06 0600) Resp:  [11-41] 41 (07/06 0600) BP: (101-157)/(44-132) 137/62 (07/06 0600) SpO2:  [88 %-98 %] 92 % (07/06 0625) Weight:  [77.1 kg] 77.1 kg (07/05 1935)    Intake/Output from previous day: 07/05 0701 - 07/06 0700 In: 1715.8 [IV Piggyback:1715.8] Out: 200 [Urine:200] Intake/Output this shift: No intake/output data recorded.  Gen: NAD, comfortable CV: RRR Pulm: Normal work of breathing Abd: Soft, not significantly ttp; nondistended Ext: SCDs in place  Lab Results: CBC  Recent Labs    09/29/18 1543 09/30/18 0211  WBC 22.9* 29.1*  HGB 13.0 11.1*  HCT 41.8 35.1*  PLT 184 163   BMET Recent Labs    09/29/18 1543 09/30/18 0211 09/30/18 0618  NA 136  --  138  K 4.0  --  4.3  CL 101  --  108  CO2 22  --  20*  GLUCOSE 139*  --  100*  BUN 14  --  16  CREATININE 0.83 0.92 0.92  CALCIUM 9.2  --  8.4*   PT/INR Recent Labs    09/30/18 0211  LABPROT 16.2*  INR 1.3*   ABG No results for input(s): PHART, HCO3 in the last 72 hours.  Invalid input(s): PCO2, PO2  Studies/Results:  Anti-infectives: Anti-infectives (From admission, onward)   Start     Dose/Rate Route Frequency Ordered Stop   09/30/18 2000  vancomycin (VANCOCIN) 1,500 mg in sodium chloride 0.9 % 500 mL IVPB     1,500 mg 250 mL/hr over 120 Minutes Intravenous Every 24 hours 09/29/18 2038     09/30/18 0100  ceFEPIme (MAXIPIME) 2 g in sodium chloride 0.9 % 100 mL IVPB     2 g 200 mL/hr over 30 Minutes Intravenous Every 8 hours 09/29/18 2031     09/30/18 0030  metroNIDAZOLE (FLAGYL) IVPB 500 mg     500 mg 100 mL/hr over 60 Minutes Intravenous Every 8 hours 09/30/18 0021     09/30/18 0030  vancomycin (VANCOCIN) IVPB 1000 mg/200 mL premix   Status:  Discontinued     1,000 mg 200 mL/hr over 60 Minutes Intravenous  Once 09/30/18 0021 09/30/18 0026   09/29/18 1645  vancomycin (VANCOCIN) 1,500 mg in sodium chloride 0.9 % 500 mL IVPB     1,500 mg 250 mL/hr over 120 Minutes Intravenous STAT 09/29/18 1634 09/29/18 2003   09/29/18 1630  ceFEPIme (MAXIPIME) 2 g in sodium chloride 0.9 % 100 mL IVPB     2 g 200 mL/hr over 30 Minutes Intravenous  Once 09/29/18 1626 09/29/18 1754   09/29/18 1630  metroNIDAZOLE (FLAGYL) IVPB 500 mg     500 mg 100 mL/hr over 60 Minutes Intravenous  Once 09/29/18 1626 09/29/18 1916   09/29/18 1630  vancomycin (VANCOCIN) IVPB 1000 mg/200 mL premix  Status:  Discontinued     1,000 mg 200 mL/hr over 60 Minutes Intravenous  Once 09/29/18 1626 09/29/18 1633       Assessment/Plan: Patient Active Problem List   Diagnosis Date Noted  . Sepsis (HCC) 09/29/2018  . Dysphasia 09/17/2018  . HCAP (healthcare-associated pneumonia) 09/15/2018  . Acute CHF (congestive heart failure) (HCC) 09/15/2018  . Enterococcus UTI 08/07/2018  . Neurocognitive deficits 08/07/2018  . Urinary tract infection with hematuria   .  Malnutrition of moderate degree 07/31/2018  . AMS (altered mental status) 07/14/2018  . Altered mental status   . Stroke ([place]) 07/10/2018  . Requires supplemental oxygen 06/05/2018  . Chest pain 03/29/2018  . Atrial fibrillation ([place]) 05/20/2015  . RBBB 05/20/2015  . Prolonged Q-T interval on ECG 05/20/2015  . CAD (coronary artery disease) 05/20/2015  . History of CVA (cerebrovascular accident) 05/20/2015  . Atrial fibrillation with RVR ([place])   . Bronchiectasis with acute exacerbation ([place])   . Unstable angina ([place]) 03/27/2013  . COPD exacerbation ([place]) 03/25/2013  . Diabetes mellitus type II, uncontrolled ([place]) 03/25/2013  . Hypothyroidism 03/25/2013  . BPH (benign prostatic hyperplasia) 03/25/2013  . HTN (hypertension) 03/25/2013    -Planning HIDA scan to further evaluate for acalculous  cholecystitis -If positive, as per Dr. Ninfa Linden, will plan for IR consultation for percutaneous cholecystostomy tube placement -Continue IV abx   LOS: 1 day   Sharon [place]. Dema Severin, M.D. [facility]

## 2018-10-01 ENCOUNTER — Other Ambulatory Visit: Payer: Self-pay

## 2018-10-01 ENCOUNTER — Inpatient Hospital Stay (HOSPITAL_COMMUNITY): Payer: Medicare Other

## 2018-10-01 LAB — CBC
HCT: 31.3 % — ABNORMAL LOW (ref 39.0–52.0)
Hemoglobin: 9.8 g/dL — ABNORMAL LOW (ref 13.0–17.0)
MCH: 28.9 pg (ref 26.0–34.0)
MCHC: 31.3 g/dL (ref 30.0–36.0)
MCV: 92.3 fL (ref 80.0–100.0)
Platelets: 129 10*3/uL — ABNORMAL LOW (ref 150–400)
RBC: 3.39 MIL/uL — ABNORMAL LOW (ref 4.22–5.81)
RDW: 14.4 % (ref 11.5–15.5)
WBC: 21 10*3/uL — ABNORMAL HIGH (ref 4.0–10.5)
nRBC: 0 % (ref 0.0–0.2)

## 2018-10-01 LAB — BASIC METABOLIC PANEL
Anion gap: 8 (ref 5–15)
BUN: 22 mg/dL (ref 8–23)
CO2: 21 mmol/L — ABNORMAL LOW (ref 22–32)
Calcium: 7.7 mg/dL — ABNORMAL LOW (ref 8.9–10.3)
Chloride: 111 mmol/L (ref 98–111)
Creatinine, Ser: 1.07 mg/dL (ref 0.61–1.24)
GFR calc Af Amer: >60 mL/min (ref >60)
GFR calc non Af Amer: >60 mL/min (ref >60)
Glucose, Bld: 124 mg/dL — ABNORMAL HIGH (ref 70–99)
Potassium: 4 mmol/L (ref 3.5–5.1)
Sodium: 140 mmol/L (ref 135–145)

## 2018-10-01 LAB — GLUCOSE, CAPILLARY
Glucose-Capillary: 126 mg/dL — ABNORMAL HIGH (ref 70–99)
Glucose-Capillary: 84 mg/dL (ref 70–99)
Glucose-Capillary: 95 mg/dL (ref 70–99)

## 2018-10-01 LAB — PHOSPHORUS: Phosphorus: 2.5 mg/dL (ref 2.5–4.6)

## 2018-10-01 LAB — MAGNESIUM: Magnesium: 1.4 mg/dL — ABNORMAL LOW (ref 1.7–2.4)

## 2018-10-01 LAB — LACTIC ACID, PLASMA: Lactic Acid, Venous: 1.4 mmol/L (ref 0.5–1.9)

## 2018-10-01 LAB — BRAIN NATRIURETIC PEPTIDE: B Natriuretic Peptide: 847.7 pg/mL — ABNORMAL HIGH (ref 0.0–100.0)

## 2018-10-01 MED ORDER — SODIUM CHLORIDE 0.9 % IV SOLN
2.0000 g | Freq: Two times a day (BID) | INTRAVENOUS | Status: DC
  Administered 2018-10-01 – 2018-10-04 (×6): 2 g via INTRAVENOUS
  Filled 2018-10-01 (×7): qty 2

## 2018-10-01 MED ORDER — METOPROLOL TARTRATE 5 MG/5ML IV SOLN
5.0000 mg | Freq: Three times a day (TID) | INTRAVENOUS | Status: DC
  Administered 2018-10-01 – 2018-10-04 (×9): 5 mg via INTRAVENOUS
  Filled 2018-10-01 (×9): qty 5

## 2018-10-01 MED ORDER — METOPROLOL TARTRATE 5 MG/5ML IV SOLN
5.0000 mg | Freq: Once | INTRAVENOUS | Status: AC
  Administered 2018-10-01: 08:00:00 5 mg via INTRAVENOUS
  Filled 2018-10-01: qty 5

## 2018-10-01 MED ORDER — MAGNESIUM SULFATE 2 GM/50ML IV SOLN
2.0000 g | Freq: Once | INTRAVENOUS | Status: AC
  Administered 2018-10-01: 2 g via INTRAVENOUS
  Filled 2018-10-01: qty 50

## 2018-10-01 MED ORDER — HYDRALAZINE HCL 20 MG/ML IJ SOLN
10.0000 mg | Freq: Four times a day (QID) | INTRAMUSCULAR | Status: DC | PRN
  Administered 2018-10-03 (×2): 10 mg via INTRAVENOUS
  Filled 2018-10-01 (×2): qty 1

## 2018-10-01 MED ORDER — VANCOMYCIN HCL 10 G IV SOLR
1250.0000 mg | INTRAVENOUS | Status: DC
  Administered 2018-10-01: 1250 mg via INTRAVENOUS
  Filled 2018-10-01 (×2): qty 1250

## 2018-10-01 MED ORDER — FUROSEMIDE 10 MG/ML IJ SOLN
40.0000 mg | Freq: Every day | INTRAMUSCULAR | Status: DC
  Administered 2018-10-01 – 2018-10-03 (×3): 40 mg via INTRAVENOUS
  Filled 2018-10-01 (×3): qty 4

## 2018-10-01 MED ORDER — HALOPERIDOL LACTATE 5 MG/ML IJ SOLN
2.0000 mg | Freq: Once | INTRAMUSCULAR | Status: AC
  Administered 2018-10-01: 01:00:00 2 mg via INTRAVENOUS
  Filled 2018-10-01: qty 1

## 2018-10-01 MED ORDER — LEVALBUTEROL HCL 0.63 MG/3ML IN NEBU
0.6300 mg | INHALATION_SOLUTION | Freq: Three times a day (TID) | RESPIRATORY_TRACT | Status: DC
  Administered 2018-10-02 – 2018-10-04 (×7): 0.63 mg via RESPIRATORY_TRACT
  Filled 2018-10-01 (×9): qty 3

## 2018-10-01 MED ORDER — CHLORHEXIDINE GLUCONATE CLOTH 2 % EX PADS
6.0000 | MEDICATED_PAD | Freq: Every day | CUTANEOUS | Status: DC
  Administered 2018-10-01 – 2018-10-03 (×3): 6 via TOPICAL

## 2018-10-01 MED ORDER — LEVALBUTEROL HCL 0.63 MG/3ML IN NEBU
0.6300 mg | INHALATION_SOLUTION | Freq: Four times a day (QID) | RESPIRATORY_TRACT | Status: DC | PRN
  Administered 2018-10-02 (×2): 0.63 mg via RESPIRATORY_TRACT
  Filled 2018-10-01: qty 3

## 2018-10-01 MED ORDER — DILTIAZEM HCL 100 MG IV SOLR
5.0000 mg/h | INTRAVENOUS | Status: DC
  Administered 2018-10-01: 16:00:00 5 mg/h via INTRAVENOUS
  Filled 2018-10-01: qty 100

## 2018-10-01 NOTE — Progress Notes (Addendum)
Spoke to son Shyquan Stallbaumer, he is able to visit patient after work around 7 pm. He was advised on hospital policies related to wearing mask at all time and being the only visitor for duration of patient visit. Son also notified about possible loss of visitation privileges such as patient becoming less confused and following commands. Nursing supervisor, Agricultural consultant and Department Director aware of possible family visitation.  He was also updated on patient's condition.

## 2018-10-01 NOTE — Progress Notes (Addendum)
Pharmacy Antibiotic Note  [name] is a 85 y.o. male admitted on 09/29/2018 with suspected intra-abdominal infection vs PNA. In the ED, patient has received Vancomycin 1500mg , Cefepime 2gm and Metronidazole 500mg  IV x 1 dose each.  Upon admission, Pharmacy has been consulted for Vancomycin and Cefepime dosing.  Today, 10/01/2018:  D2 full abx  WBC elevated but improving  Afebrile x 24 hr  SCr increasing  Plan:  Empirically reduce vancomycin to 1250 mg IV Q 24 hrs (AUC 478 with SCr 1.07; Vd 0.72)  Reduce Cefepime to 2g IV q12 hr  Flagyl dosing appropriate  MRSA PCR negative although recent admissions for HCAP with IV abx; if stable x 48 hr (i.e. tomorrow) would consider stopping vanc   Height: 6' (182.9 cm) Weight: 170 lb (77.1 kg) IBW/kg (Calculated) : 77.6  Temp (24hrs), Avg:98.7 F (37.1 C), Min:97.8 F (36.6 C), Max:100 F (37.8 C)  Recent Labs  Lab 09/29/18 1543 09/29/18 1700 09/30/18 0211 09/30/18 0618 09/30/18 0742 10/01/18 0209 10/01/18 0810  WBC 22.9*  --  29.1*  --  25.6* 21.0*  --   CREATININE 0.83  --  0.92 0.92  --  1.07  --   LATICACIDVEN  --  1.8 1.7 2.5*  --   --  1.4    Estimated Creatinine Clearance: 51 mL/min (by C-G formula based on SCr of 1.07 mg/dL).    No Known Allergies  Antimicrobials this admission: 7/5 Metronidazole >>   7/5 Cefepime >>   7/5 Vanc >>  Dose adjustments this admission: 7/7 - reduce vanc to 1250 q24 and cefepime to 2g q12 for rising SCr  Microbiology results: 7/5 BCx: sent 7/5 SARS Covid 2: negative  7/6 MRSA PCR: neg   Thank you for allowing pharmacy to be a part of this patient's care.  Reuel Boom, PharmD, BCPS [phone] 10/01/2018, 12:25 PM

## 2018-10-01 NOTE — Progress Notes (Signed)
Notifed on call, Baltazar Najjar in regards to patient's SBP less than 90. Patient has been on cardizem drip, discontinued per parameters.

## 2018-10-01 NOTE — Progress Notes (Signed)
Pt. HR sustaining 130's to 140's. MD made aware. 12 lead EKG performed showing A fib with RVR. MD aware and IV cardizem began to decrease HR. Will continue to monitor.

## 2018-10-01 NOTE — Evaluation (Signed)
Clinical/Bedside Swallow Evaluation Patient Details  Name: [name] MRN: [id number] Date of Birth: 06/17/1927  Today's Date: 10/01/2018 Time: SLP Start Time (ACUTE ONLY): 1255 SLP Stop Time (ACUTE ONLY): 1315 SLP Time Calculation (min) (ACUTE ONLY): 20 min  Past Medical History:  Past Medical History:  Diagnosis Date  . Atrial fibrillation (HCC)   . COPD (chronic obstructive pulmonary disease) (HCC)   . CVA (cerebral infarction)   . Diabetes mellitus without complication (HCC)   . Essential hypertension, benign   . Hard of hearing    Past Surgical History:  Past Surgical History:  Procedure Laterality Date  . HERNIA REPAIR    . LEFT HEART CATHETERIZATION WITH CORONARY ANGIOGRAM N/A 03/28/2013   Procedure: LEFT HEART CATHETERIZATION WITH CORONARY ANGIOGRAM;  Surgeon: Peter M SwazilandJordan, MD;  [facility];  Service: Cardiovascular;  Laterality: N/A;   HPI:  [name] with PMH: presumed dementia, afib, COPD, CVA, DM-2, HTN, hearing impairment, hypothyroidism, CAD and glaucoma who was admitted on 7/5 secondary to c/o abdominal pain and low bak pain over 2 days prior. Patient was recent admission from 6/21-6/24 with PNA and CHF.   Assessment / Plan / Recommendation Clinical Impression  Patient presents with a moderate oropharyngeal dysphagia which appears to be largely secondary to cognitive deficits and decreased alertness. Patient consumed thin liquids via straw sips and did take large volume sips, without overt s/s of aspiration but with belching afterwards. He exhibited minimal oral residuals with puree solids. Patient kept eyes closed but was alert for PO's when cued by SLP with verbal and tactile cues. As he has previously demonstrated functional decline during previous hospitalization, continues to be combatitve, agitated and with decreased alertness, recommending NPO except meds crushed in puree and PO's of ice chips or water after oral care. SLP Visit  Diagnosis: Dysphagia, unspecified (R13.10)    Aspiration Risk  Moderate aspiration risk    Diet Recommendation NPO;NPO except meds;Ice chips PRN after oral care   Medication Administration: Crushed with puree Postural Changes: Seated upright at 90 degrees    Other  Recommendations Oral Care Recommendations: Oral care QID   Follow up Recommendations        Frequency and Duration min 2x/week          Prognosis Prognosis for Safe Diet Advancement: Fair Barriers to Reach Goals: Severity of deficits;Time post onset      Swallow Study   General Date of Onset: 10/01/18 HPI: [name] with PMH: presumed dementia, afib, COPD, CVA, DM-2, HTN, hearing impairment, hypothyroidism, CAD and glaucoma who was admitted on 7/5 secondary to c/o abdominal pain and low bak pain over 2 days prior. Patient was recent admission from 6/21-6/24 with PNA and CHF. Type of Study: Bedside Swallow Evaluation Previous Swallow Assessment: BSE during previous admission Diet Prior to this Study: NPO Temperature Spikes Noted: No Respiratory Status: Nasal cannula History of Recent Intubation: No Behavior/Cognition: Alert;Requires cueing;Doesn't follow directions;Agitated;Confused;Distractible;Lethargic/Drowsy Oral Cavity Assessment: Other (comment)(mild thick secretions) Oral Care Completed by SLP: Yes Oral Cavity - Dentition: Dentures, top;Other (Comment) Vision: Impaired for self-feeding Self-Feeding Abilities: Total assist Patient Positioning: Upright in bed Baseline Vocal Quality: Normal Volitional Cough: Cognitively unable to elicit Volitional Swallow: Unable to elicit    Oral/Motor/Sensory Function Overall Oral Motor/Sensory Function: Within functional limits   Ice Chips     Thin Liquid Thin Liquid: Impaired Presentation: Straw Pharyngeal  Phase Impairments: Suspected delayed Swallow;Other (comments) Other Comments: no overt s/s  of aspiration or penetration despite large volume  straw sips of thin liquids    Nectar Thick     Honey Thick     Puree Puree: Impaired Presentation: Spoon Oral Phase Impairments: Poor awareness of bolus Oral Phase Functional Implications: Oral residue   Solid     Solid: Not tested      [name] 10/01/2018,5:40 PM  Sonia Baller, MA, CCC-SLP Speech Therapy [facility] Pager: [phone]

## 2018-10-01 NOTE — Progress Notes (Addendum)
PROGRESS NOTE    [name]  ZOX:[id number] DOB: 11/23/1927 DOA: 09/29/2018 PCP: Bing NeighborsHarris, Bruce S, FNP   Brief Narrative: 85 year old with past medical history significant for presumed dementia, A. fib, COPD, CVA, type 2 diabetes, hypertension, hearing impairment, hypothyroidism, coronary artery disease and glaucoma resents from home with progressive confusion.  Patient is unable to participate in HPI due to altered mental status.  Per family patient has been complaining of abdominal pain and low back pain over the last couple of days.  Evaluation in the ED temperature 103, heart rate 115, respiration rate 34, blood pressure 115/44 oxygen sat 92 on room air.  White blood cell 22 hemoglobin 13.  Chest x-ray no acute cardiopulmonary disease [name] test negative.  CT abdomen and pelvis notable for possible distended gallbladder.  Right upper quadrant ultrasound noted left lobe liver cyst no gallstones and minimal pericholecystic fluid noted.  Patient was admitted with sepsis of unclear etiology, acute metabolic and toxic encephalopathy.  Troponins has been increasing.  Discussed with cardiology on this level of troponin are likely related to sepsis.  General surgery was consulted and they are recommending HIDA scan, if positive patient will require percutaneous cholecystostomy.  Patient became hypoxic overnight 7-06. Chest x ray with asymmetric infiltrates edema vs PNA. IV lasix ordered. Repeat chest x ray 7-08.    Assessment & Plan:   Active Problems:   Diabetes mellitus type II,    Hypothyroidism   BPH (benign prostatic hyperplasia)   HTN (hypertension)   Atrial fibrillation (HCC)   Altered mental status   Sepsis (HCC)  1-Severe Sepsis; PNA Vs acalculous cholecystitis.  Unclear etiology.  Patient presented with fever, tachycardia, leukocytosis. Chest x ray; negative..Repeated chest x ray with asymmetric infiltrates. Continue with IV antibiotics and start IV lasix.  Blood  culture no growth to date  CT abdomen; possible distended gallbladder.  US; mild pericholecystic fluid.  HID scan Pending. Try yesterday with IV ativan and haldol.  Continue with IV fluids, IV Cefepime and vancomycin.  CT lumbar spine order. Patient has not been  able to cooperate for study.  Discussed with Dr Cliffton AstersWhite, will hold getting HIDA scan until tomorrow or until respiratory status is more stable. Could do clears if patient doesn't showed sign of aspiration.   2-Acute Metabolic Encephalopathy;  Related to acute illness PRN Ativan Treating acute infection Liver function test normal.  Acute Hypoxic respiratory failure;  He is now on 8 L oxygen, high flow.  Chest x ray with asymmetric edema vs infection.  Check BNP IV lasix.  Stop Fluids.   A fib RVR;  IV metoprolol IV  Start Cardizem gtt.    Elevation troponin: Likely demand to ischemia, secondary to sepsis. Discussed with cardiology. Supportive care, treat underlying sepsis physiology.  History of persistent A. Fib: Apparently was prescribed Eliquis but has not been taking it due to cost. On metoprolol p.o. twice daily, changed to IV now the patient is unable to tolerate orals  History of COPD: Nebulizer as needed.  Hypertension: Hold amlodipine and lisinopril.  History of CVA: Continue with atorvastatin  Hypothyroidism: Continue with Synthroid  Glaucoma; continue with home eyedrops.  Diabetes type 2: On metformin while inpatient. Continue with a sliding scale insulin.  Hypomagnesemia Replete IV mag  PVC; IV  mg.  On IV metoprolol.   Estimated body mass index is 23.06 kg/m as calculated from the following:   Height as of this encounter: 6' (1.829 m).   Weight as of this encounter:  77.1 kg.   DVT prophylaxis: Heparin Code Status: DNR Family Communication; Son Updated.  Disposition Plan: Remain in the step down unit, treatment of encephalopathy, sepsis.  Consultants:   Surgery.   Procedures:    HIDA Scan; [place]; No gallstones or gallbladder wall thickening. Minimal pericholecystic fluid is noted. Ultrasound technologist could not cyst sonographic Murphy sign because the patient was unresponsive. Liver cyst.  Antimicrobials:    Subjective: He is responsive today, agitated.  He denies abdominal pain. He wants water.  Overnight he became hypoxic, he is now on 8 L oxygen.   Objective: Vitals:   10/01/18 0240 10/01/18 0302 10/01/18 0400 10/01/18 0500  BP: (!) 101/33 (!) 122/35 (!) 116/44   Pulse: 77 91 79 79  Resp: 19 (!) 22 (!) 21 (!) 21  Temp:  98.9 F (37.2 C)    TempSrc:  Axillary    SpO2: 98% 96% 99% 98%  Weight:      Height:        Intake/Output Summary (Last 24 hours) at 10/01/2018 0744 Last data filed at 10/01/2018 0300 Gross per 24 hour  Intake 1951.56 ml  Output 350 ml  Net 1601.56 ml   Filed Weights   09/29/18 1935  Weight: 77.1 kg    Examination:  General exam: Mild distress Respiratory system: Bilateral crackles.  Cardiovascular system: S 1, S 2 RRR Gastrointestinal system: BS present, soft, nt Central nervous system: able to answer few questions. Moves extremities, agitated Extremities: Symmetric power.  Skin: no rashes.   Data Reviewed: I have personally reviewed following labs and imaging studies  CBC: Recent Labs  Lab 09/29/18 1543 09/30/18 0211 09/30/18 0742 10/01/18 0209  WBC 22.9* 29.1* 25.6* 21.0*  NEUTROABS 21.0*  --   --   --   HGB 13.0 11.1* 11.1* 9.8*  HCT 41.8 35.1* 34.7* 31.3*  MCV 89.1 89.5 91.1 92.3  PLT 184 163 163 129*   Basic Metabolic Panel: Recent Labs  Lab 09/29/18 1543 09/30/18 0211 09/30/18 0618 09/30/18 0742 10/01/18 0209  NA 136  --  138  --  140  K 4.0  --  4.3  --  4.0  CL 101  --  108  --  111  CO2 22  --  20*  --  21*  GLUCOSE 139*  --  100*  --  124*  BUN 14  --  16  --  22  CREATININE 0.83 0.92 0.92  --  1.07  CALCIUM 9.2  --  8.4*  --  7.7*  MG  --   --   --  1.3* 1.4*   GFR: Estimated  Creatinine Clearance: 51 mL/min (by C-G formula based on SCr of 1.07 mg/dL). Liver Function Tests: Recent Labs  Lab 09/29/18 1543 09/30/18 0618  AST 23 31  ALT 21 19  ALKPHOS 60 41  BILITOT 0.8 0.8  PROT 7.0 5.6*  ALBUMIN 4.2 3.3*   Recent Labs  Lab 09/29/18 1543  LIPASE 32   No results for input(s): AMMONIA in the last 168 hours. Coagulation Profile: Recent Labs  Lab 09/30/18 0211  INR 1.3*   Cardiac Enzymes: No results for input(s): CKTOTAL, CKMB, CKMBINDEX, TROPONINI in the last 168 hours. BNP (last 3 results) No results for input(s): PROBNP in the last 8760 hours. HbA1C: Recent Labs    09/30/18 0211  HGBA1C 7.2*   CBG: Recent Labs  Lab 09/30/18 1138 09/30/18 1239 09/30/18 1639 09/30/18 2137 10/01/18 0736  GLUCAP 116* 119* 91 92 126*  Lipid Profile: No results for input(s): CHOL, HDL, LDLCALC, TRIG, CHOLHDL, LDLDIRECT in the last 72 hours. Thyroid Function Tests: Recent Labs    09/30/18 0211  TSH 1.023   Anemia Panel: No results for input(s): VITAMINB12, FOLATE, FERRITIN, TIBC, IRON, RETICCTPCT in the last 72 hours. Sepsis Labs: Recent Labs  Lab 09/29/18 1700 09/30/18 0211 09/30/18 0618  PROCALCITON  --  2.36  --   LATICACIDVEN 1.8 1.7 2.5*    Recent Results (from the past 240 hour(s))  Culture, blood (routine x 2)     Status: None (Preliminary result)   Collection Time: 09/29/18  3:43 PM   Specimen: BLOOD  Result Value Ref Range Status   Specimen Description   Final    BLOOD BLOOD LEFT FOREARM Performed at [facility], [address] [place], [place] [address]    Special Requests   Final    BOTTLES DRAWN AEROBIC AND ANAEROBIC Blood Culture results may not be optimal due to an inadequate volume of blood received in culture bottles Performed at [facility], [address] [place], [place] [address]    Culture   Final    NO GROWTH < 24 HOURS Performed at [facility], [address], [place], [place] [address]    Report Status PENDING  Incomplete  Culture, blood (routine x 2)     Status: None (Preliminary result)   Collection Time: 09/29/18  3:43 PM   Specimen: BLOOD  Result Value Ref Range Status   Specimen Description   Final    BLOOD BLOOD RIGHT FOREARM Performed at [facility], [address], [place], [place] [address]    Special Requests   Final    BOTTLES DRAWN AEROBIC AND ANAEROBIC Blood Culture adequate volume Performed at [facility], [address] [place], [place] [address]    Culture   Final    NO GROWTH < 24 HOURS Performed at [facility], [address] [place], [place] [address]    Report Status PENDING  Incomplete  SARS Coronavirus 2 (CEPHEID- Performed in [facility] hospital lab), Hosp Order     Status: None   Collection Time: 09/29/18  3:44 PM   Specimen: Nasopharyngeal Swab  Result Value Ref Range Status   SARS Coronavirus 2 NEGATIVE NEGATIVE Final    Comment: (NOTE) If result is NEGATIVE SARS-CoV-2 target nucleic acids are NOT DETECTED. The SARS-CoV-2 RNA is generally detectable in upper and lower  respiratory specimens during the acute phase of infection. The lowest  concentration of SARS-CoV-2 viral copies this assay can detect is 250  copies / mL. A negative result does not preclude SARS-CoV-2 infection  and should not be used as the sole basis for treatment or other  patient management decisions.  A negative result may occur with  improper specimen collection / handling, submission of specimen other  than nasopharyngeal swab, presence of viral mutation(s) within the  areas targeted by this assay, and inadequate number of viral copies  (<250 copies / mL). A negative result must be combined with clinical  observations, patient history, and epidemiological information. If result is POSITIVE SARS-CoV-2 target nucleic acids are DETECTED. The SARS-CoV-2 RNA is generally detectable in  upper and lower  respiratory specimens dur ing the acute phase of infection.  Positive  results are indicative of active infection with SARS-CoV-2.  Clinical  correlation with patient history and other diagnostic information is  necessary to determine patient infection  status.  Positive results do  not rule out bacterial infection or co-infection with other viruses. If result is PRESUMPTIVE POSTIVE SARS-CoV-2 nucleic acids MAY BE PRESENT.   A presumptive positive result was obtained on the submitted specimen  and confirmed on repeat testing.  While 2019 novel coronavirus  (SARS-CoV-2) nucleic acids may be present in the submitted sample  additional confirmatory testing may be necessary for epidemiological  and / or clinical management purposes  to differentiate between  SARS-CoV-2 and other Sarbecovirus currently known to infect humans.  If clinically indicated additional testing with an alternate test  methodology [phone]([id number]) is advised. The SARS-CoV-2 RNA is generally  detectable in upper and lower respiratory sp ecimens during the acute  phase of infection. The expected result is Negative. Fact Sheet for Patients:  BoilerBrush.com.cyhttps://www.fda.gov/media/136312/download Fact Sheet for Healthcare Providers: https://pope.com/https://www.fda.gov/media/136313/download This test is not yet approved or cleared by the [place] FDA and has been authorized for detection and/or diagnosis of SARS-CoV-2 by FDA under an Emergency Use Authorization (EUA).  This EUA will remain in effect (meaning this test can be used) for the duration of the COVID-19 declaration under Section 564(b)(1) of the Act, 21 U.S.C. section 360bbb-3(b)(1), unless the authorization is terminated or revoked sooner. Performed at [facility], [address] [place], [place] [address]   MRSA PCR Screening     Status: None   Collection Time: 09/30/18 12:22 AM   Specimen: Nasal Mucosa; Nasopharyngeal  Result Value Ref Range  Status   MRSA by PCR NEGATIVE NEGATIVE Final    Comment:        The GeneXpert MRSA Assay (FDA approved for NASAL specimens only), is one component of a comprehensive MRSA colonization surveillance program. It is not intended to diagnose MRSA infection nor to guide or monitor treatment for MRSA infections. Performed at [facility], [address] [place], [place] [address]          Radiology Studies: Dg Chest 2 View  Result Date: 09/29/2018 CLINICAL DATA:  Productive cough, shortness of breath. EXAM: CHEST - 2 VIEW COMPARISON:  Radiograph of September 17, 2018. FINDINGS: The heart size and mediastinal contours are within normal limits. Both lungs are clear. No pneumothorax or pleural effusion is noted. The visualized skeletal structures are unremarkable. IMPRESSION: No active cardiopulmonary disease. Electronically Signed   By: Lupita RaiderJames  Green Jr M.D.   On: 09/29/2018 15:04   Ct Abdomen Pelvis W Contrast  Result Date: 09/29/2018 CLINICAL DATA:  85 year old male with fever of 103, altered mental status. Abdominal pain. EXAM: CT ABDOMEN AND PELVIS WITH CONTRAST TECHNIQUE: Multidetector CT imaging of the abdomen and pelvis was performed using the standard protocol following bolus administration of intravenous contrast. CONTRAST:  100mL OMNIPAQUE IOHEXOL 300 MG/ML  SOLN COMPARISON:  CT Abdomen and Pelvis 10/28/2014. chest radiographs earlier today. FINDINGS: Lower chest: Respiratory motion. No pericardial or pleural effusion. No lung base consolidation. Chronic mild cardiomegaly and small to moderate hiatal hernia. Hepatobiliary: Motion artifact, but the liver and bile ducts appear stable since 2016. There is a small chronic left hepatic lobe cyst. The gallbladder is more distended today, but there is no definite pericholecystic inflammation when allowing for motion. Right upper quadrant ultrasound would be valuable to confirm. Pancreas: Stable atrophy. Spleen: Negative.  Adrenals/Urinary Tract: Normal adrenal glands. Symmetric renal enhancement and contrast excretion with no hydronephrosis. Decompressed and normal proximal ureters. Unremarkable urinary bladder. Stomach/Bowel: Mild chronic sigmoid diverticulosis. Mild retained stool in the distal colon. Decompressed descending colon  and splenic flexure. Mild retained stool in the transverse and ascending colon. Negative cecum and appendix (series 2, image 52). Motion artifact, but no convincing large bowel inflammation. Negative terminal ileum. No dilated small bowel. Chronic small bowel containing left inguinal hernia appears stable since 2016 and non incarcerated. Chronic hiatal hernia. Negative intra-abdominal stomach. No free air or free fluid. Vascular/Lymphatic: Aortoiliac calcified atherosclerosis. Ectatic to borderline aneurysmal infrarenal abdominal aorta (30 millimeters diameter) has increased by 1 millimeter since the prior CT. Major arterial structures appear patent. Portal venous system appears patent. No lymphadenopathy. Reproductive: Chronic left inguinal hernia containing a small bowel loop (series 2, image 72) appears similar to that in 2016, with no active inflammation identified. Other: No pelvic free fluid. Musculoskeletal: Motion artifact. Osteopenia. No acute osseous abnormality identified. IMPRESSION: 1. Distended gallbladder today, and motion artifact rendering determination of pericholecystic inflammation difficult. Recommend follow-up Right Upper Quadrant Ultrasound. 2. Otherwise no acute or inflammatory process identified in the abdomen or pelvis. 3. Chronic small bowel containing left inguinal hernia appears stable since 2016 and non incarcerated. Normal appendix. Negative visible lung bases. 4. Aortic Atherosclerosis (ICD10-I70.0). Ectatic to mildly aneurysmal infrarenal aorta. Recommend followup by Ultrasound in 3 years. This recommendation follows ACR consensus guidelines: White Paper of the ACR  Incidental Findings Committee II on Vascular Findings. J Am Coll Radiol 2013; 10:789-794. Aortic aneurysm NOS (ICD10-I71.9) Electronically Signed   By: Odessa FlemingH  Hall M.D.   On: 09/29/2018 18:01   [place] Abdomen Limited Ruq  Result Date: 09/29/2018 CLINICAL DATA:  Abdominal tenderness today. EXAM: ULTRASOUND ABDOMEN LIMITED RIGHT UPPER QUADRANT COMPARISON:  None. FINDINGS: Gallbladder: No gallstones or wall thickening visualized. Minimal pericholecystic fluid is identified. Ultrasound technologist could not assess sonographic Murphy sign because the patient was unresponsive. Common bile duct: Diameter: 4.5 mm Liver: There is a 1.3 x 1.2 x 1.1 cm cyst in the left lobe liver. Within normal limits in parenchymal echogenicity. Portal vein is patent on color Doppler imaging with normal direction of blood flow towards the liver. IMPRESSION: No gallstones or gallbladder wall thickening. Minimal pericholecystic fluid is noted. Ultrasound technologist could not cyst sonographic Murphy sign because the patient was unresponsive. Liver cyst. Electronically Signed   By: Sherian ReinWei-Chen  Lin M.D.   On: 09/29/2018 19:01        Scheduled Meds:  atorvastatin  80 mg Oral q1800   brimonidine  1 drop Both Eyes BID   Chlorhexidine Gluconate Cloth  6 each Topical Daily   finasteride  5 mg Oral Daily   heparin  5,000 Units Subcutaneous Q8H   insulin aspart  0-5 Units Subcutaneous QHS   insulin aspart  0-9 Units Subcutaneous TID WC   levalbuterol  0.63 mg Nebulization BID   levothyroxine  50 mcg Oral Q0600   LORazepam  1 mg Intravenous Once   mouth rinse  15 mL Mouth Rinse BID   metoprolol tartrate  5 mg Intravenous Once   metoprolol tartrate  5 mg Intravenous Q8H   olopatadine  1 drop Both Eyes BID   pantoprazole  40 mg Oral Daily   polyvinyl alcohol  1 drop Both Eyes QID   tamsulosin  0.4 mg Oral Daily   Continuous Infusions:  ceFEPime (MAXIPIME) IV Stopped (10/01/18 0205)   lactated ringers 75 mL/hr at  09/30/18 0436   magnesium sulfate bolus IVPB     metronidazole Stopped (10/01/18 0631)   vancomycin Stopped (09/30/18 2216)     LOS: 2 days    Time spent: 35 minutes  Elmarie Shiley, MD [facility] Pager [phone]  If 7PM-7AM, please contact night-coverage [email or web address] Password [facility] 10/01/2018, 7:44 AM

## 2018-10-02 ENCOUNTER — Inpatient Hospital Stay (HOSPITAL_COMMUNITY): Payer: Medicare Other

## 2018-10-02 DIAGNOSIS — I5031 Acute diastolic (congestive) heart failure: Secondary | ICD-10-CM

## 2018-10-02 LAB — BLOOD GAS, ARTERIAL
Acid-base deficit: 2.9 mmol/L — ABNORMAL HIGH (ref 0.0–2.0)
Bicarbonate: 20.4 mmol/L (ref 20.0–28.0)
Drawn by: 257701
O2 Content: 15 L/min
O2 Saturation: 83.5 %
Patient temperature: 98.6
pCO2 arterial: 32.7 mmHg (ref 32.0–48.0)
pH, Arterial: 7.412 (ref 7.350–7.450)
pO2, Arterial: 49.2 mmHg — ABNORMAL LOW (ref 83.0–108.0)

## 2018-10-02 LAB — CBC
HCT: 33.1 % — ABNORMAL LOW (ref 39.0–52.0)
Hemoglobin: 10.2 g/dL — ABNORMAL LOW (ref 13.0–17.0)
MCH: 28.6 pg (ref 26.0–34.0)
MCHC: 30.8 g/dL (ref 30.0–36.0)
MCV: 92.7 fL (ref 80.0–100.0)
Platelets: 128 10*3/uL — ABNORMAL LOW (ref 150–400)
RBC: 3.57 MIL/uL — ABNORMAL LOW (ref 4.22–5.81)
RDW: 14.6 % (ref 11.5–15.5)
WBC: 13.6 10*3/uL — ABNORMAL HIGH (ref 4.0–10.5)
nRBC: 0 % (ref 0.0–0.2)

## 2018-10-02 LAB — GLUCOSE, CAPILLARY
Glucose-Capillary: 103 mg/dL — ABNORMAL HIGH (ref 70–99)
Glucose-Capillary: 112 mg/dL — ABNORMAL HIGH (ref 70–99)
Glucose-Capillary: 132 mg/dL — ABNORMAL HIGH (ref 70–99)
Glucose-Capillary: 86 mg/dL (ref 70–99)
Glucose-Capillary: 97 mg/dL (ref 70–99)
Glucose-Capillary: 99 mg/dL (ref 70–99)

## 2018-10-02 LAB — COMPREHENSIVE METABOLIC PANEL
ALT: 24 U/L (ref 0–44)
AST: 39 U/L (ref 15–41)
Albumin: 2.8 g/dL — ABNORMAL LOW (ref 3.5–5.0)
Alkaline Phosphatase: 43 U/L (ref 38–126)
Anion gap: 10 (ref 5–15)
BUN: 28 mg/dL — ABNORMAL HIGH (ref 8–23)
CO2: 19 mmol/L — ABNORMAL LOW (ref 22–32)
Calcium: 7.7 mg/dL — ABNORMAL LOW (ref 8.9–10.3)
Chloride: 111 mmol/L (ref 98–111)
Creatinine, Ser: 1.17 mg/dL (ref 0.61–1.24)
GFR calc Af Amer: >60 mL/min (ref >60)
GFR calc non Af Amer: 55 mL/min — ABNORMAL LOW (ref >60)
Glucose, Bld: 113 mg/dL — ABNORMAL HIGH (ref 70–99)
Potassium: 3.5 mmol/L (ref 3.5–5.1)
Sodium: 140 mmol/L (ref 135–145)
Total Bilirubin: 1.4 mg/dL — ABNORMAL HIGH (ref 0.3–1.2)
Total Protein: 5.5 g/dL — ABNORMAL LOW (ref 6.5–8.1)

## 2018-10-02 LAB — ECHOCARDIOGRAM COMPLETE
Height: 72 in
Weight: 2720 oz

## 2018-10-02 LAB — MAGNESIUM: Magnesium: 2.3 mg/dL (ref 1.7–2.4)

## 2018-10-02 MED ORDER — LORAZEPAM 2 MG/ML IJ SOLN
1.0000 mg | Freq: Once | INTRAMUSCULAR | Status: AC
  Administered 2018-10-02: 1 mg via INTRAVENOUS

## 2018-10-02 MED ORDER — OLANZAPINE 5 MG PO TBDP
2.5000 mg | ORAL_TABLET | Freq: Every day | ORAL | Status: DC | PRN
  Administered 2018-10-02 – 2018-10-03 (×2): 2.5 mg via ORAL
  Filled 2018-10-02 (×4): qty 0.5

## 2018-10-02 MED ORDER — DILTIAZEM HCL 25 MG/5ML IV SOLN
5.0000 mg | Freq: Once | INTRAVENOUS | Status: AC
  Administered 2018-10-02: 05:00:00 5 mg via INTRAVENOUS
  Filled 2018-10-02: qty 5

## 2018-10-02 MED ORDER — OLANZAPINE 5 MG PO TBDP
2.5000 mg | ORAL_TABLET | Freq: Every day | ORAL | Status: DC
  Administered 2018-10-02 – 2018-10-03 (×2): 2.5 mg via ORAL
  Filled 2018-10-02 (×2): qty 0.5

## 2018-10-02 MED ORDER — IPRATROPIUM BROMIDE 0.02 % IN SOLN
0.5000 mg | Freq: Three times a day (TID) | RESPIRATORY_TRACT | Status: DC
  Administered 2018-10-03 – 2018-10-04 (×5): 0.5 mg via RESPIRATORY_TRACT
  Filled 2018-10-02 (×5): qty 2.5

## 2018-10-02 MED ORDER — PERFLUTREN LIPID MICROSPHERE
1.0000 mL | INTRAVENOUS | Status: AC | PRN
  Administered 2018-10-02: 14:00:00 2 mL via INTRAVENOUS
  Filled 2018-10-02: qty 10

## 2018-10-02 MED ORDER — HALOPERIDOL LACTATE 5 MG/ML IJ SOLN
2.0000 mg | Freq: Once | INTRAMUSCULAR | Status: AC
  Administered 2018-10-02: 02:00:00 2 mg via INTRAVENOUS
  Filled 2018-10-02: qty 1

## 2018-10-02 NOTE — Progress Notes (Signed)
Notified on call, Baltazar Najjar for advisement in regards to patient's HR increased 120-150 for 30 minutes. Awaiting call back.

## 2018-10-02 NOTE — Consult Note (Signed)
Telepsych Consultation   Reason for Consult:  Agitation Referring Physician:  Dr. Margie Ege Location of Patient: [facility] Location of Provider: [facility]  Patient Identification: [name] MRN:  [id number] Principal Diagnosis: Altered mental status Diagnosis:  Principal Problem:   Altered mental status Active Problems:   HTN (hypertension)   Atrial fibrillation (HCC)   Sepsis (HCC)   Total Time spent with patient: 1 hour  Subjective:   [name] is a 85 y.o. male patient admitted with sepsis.  HPI:   Per chart review, patient was admitted with sepsis of unclear etiology in the setting of fever, tachycardia and leukocytosis. CT abdomen is remarkable for distended gallbladder. General surgery recommends a HIDA scan and if positive the patient will require percutaneous cholecystostomy. His hospital course has been complicated by acute metabolic and toxic encephalopathy. He has been agitated and pulling his mask off his face. He did not sleep overnight. He requires restraints. He has a history of dementia.    He is unable to participate in interview and appears drowsy. He reports, "huh" each time his name is called but he does not speak any further. His nurse reports that he has been very agitated. He is still in restraints. He is not responding to medications that the team has given for agitation. He is unable to have further workup due to agitation. He received Ativan 1 mg this morning.   Past Psychiatric History: Dementia   Risk to Self:  UTA due to AMS.  Risk to Others:  UTA duet to AMS.  Prior Inpatient Therapy:  None per chart review. Prior Outpatient Therapy:  None per chart review.  Past Medical History:  Past Medical History:  Diagnosis Date  . Atrial fibrillation (HCC)   . COPD (chronic obstructive pulmonary disease) (HCC)   . CVA (cerebral infarction)   . Diabetes mellitus without complication (HCC)   . Essential hypertension, benign   .  Hard of hearing     Past Surgical History:  Procedure Laterality Date  . HERNIA REPAIR    . LEFT HEART CATHETERIZATION WITH CORONARY ANGIOGRAM N/A 03/28/2013   Procedure: LEFT HEART CATHETERIZATION WITH CORONARY ANGIOGRAM;  Surgeon: Peter M Swaziland, MD;  Location: [facility] CATH LAB;  Service: Cardiovascular;  Laterality: N/A;   Family History:  Family History  Problem Relation Age of Onset  . CAD Son   . Skin cancer Brother    Family Psychiatric  History: None per chart review.  Social History:  Social History   Substance and Sexual Activity  Alcohol Use No     Social History   Substance and Sexual Activity  Drug Use No    Social History   Socioeconomic History  . Marital status: Married    Spouse name: Not on file  . Number of children: Not on file  . Years of education: Not on file  . Highest education level: Not on file  Occupational History  . Not on file  Social Needs  . Financial resource strain: Not on file  . Food insecurity    Worry: Not on file    Inability: Not on file  . Transportation needs    Medical: Not on file    Non-medical: Not on file  Tobacco Use  . Smoking status: Former Smoker    Types: Cigarettes  . Smokeless tobacco: Former Engineer, water and Sexual Activity  . Alcohol use: No  . Drug use: No  . Sexual activity: Not on file  Lifestyle  . Physical activity    Days per week: Not on file    Minutes per session: Not on file  . Stress: Not on file  Relationships  . Social Musicianconnections    Talks on phone: Not on file    Gets together: Not on file    Attends religious service: Not on file    Active member of club or organization: Not on file    Attends meetings of clubs or organizations: Not on file    Relationship status: Not on file  Other Topics Concern  . Not on file  Social History Narrative  . Not on file   Additional Social History: N/A    Allergies:  No Known Allergies  Labs:  Results for orders placed or performed during the  hospital encounter of 09/29/18 (from the past 48 hour(s))  Glucose, capillary     Status: Abnormal   Collection Time: 09/30/18 12:39 PM  Result Value Ref Range   Glucose-Capillary 119 (H) 70 - 99 mg/dL  Glucose, capillary     Status: None   Collection Time: 09/30/18  4:39 PM  Result Value Ref Range   Glucose-Capillary 91 70 - 99 mg/dL  Glucose, capillary     Status: None   Collection Time: 09/30/18  9:37 PM  Result Value Ref Range   Glucose-Capillary 92 70 - 99 mg/dL  CBC     Status: Abnormal   Collection Time: 10/01/18  2:09 AM  Result Value Ref Range   WBC 21.0 (H) 4.0 - 10.5 K/uL   RBC 3.39 (L) 4.22 - 5.81 MIL/uL   Hemoglobin 9.8 (L) 13.0 - 17.0 g/dL   HCT 16.131.3 (L) 09.639.0 - 04.552.0 %   MCV 92.3 80.0 - 100.0 fL   MCH 28.9 26.0 - 34.0 pg   MCHC 31.3 30.0 - 36.0 g/dL   RDW 40.914.4 81.111.5 - 91.415.5 %   Platelets 129 (L) 150 - 400 K/uL   nRBC 0.0 0.0 - 0.2 %    Comment: Performed at [facility], [address] [place], [place] [address]  Basic metabolic panel     Status: Abnormal   Collection Time: 10/01/18  2:09 AM  Result Value Ref Range   Sodium 140 135 - 145 mmol/L   Potassium 4.0 3.5 - 5.1 mmol/L   Chloride 111 98 - 111 mmol/L   CO2 21 (L) 22 - 32 mmol/L   Glucose, Bld 124 (H) 70 - 99 mg/dL   BUN 22 8 - 23 mg/dL   Creatinine, Ser 6.211.07 0.61 - 1.24 mg/dL   Calcium 7.7 (L) 8.9 - 10.3 mg/dL   GFR calc non Af Amer >60 >60 mL/min   GFR calc Af Amer >60 >60 mL/min   Anion gap 8 5 - 15    Comment: Performed at [facility], [address] [place], [place] [address]  Magnesium     Status: Abnormal   Collection Time: 10/01/18  2:09 AM  Result Value Ref Range   Magnesium 1.4 (L) 1.7 - 2.4 mg/dL    Comment: Performed at [facility], [address] [place], [place] [address]  Glucose, capillary     Status: Abnormal   Collection Time: 10/01/18  7:36 AM  Result Value Ref Range   Glucose-Capillary 126 (H) 70 - 99 mg/dL  Lactic  acid, plasma     Status: None   Collection Time: 10/01/18  8:10 AM  Result Value Ref Range   Lactic  Acid, Venous 1.4 0.5 - 1.9 mmol/L    Comment: Performed at [facility], [place] [address] [place], [place] [address]  Phosphorus     Status: None   Collection Time: 10/01/18  8:10 AM  Result Value Ref Range   Phosphorus 2.5 2.5 - 4.6 mg/dL    Comment: Performed at [facility], [place] [address] [place], [place] [address]  Brain natriuretic peptide     Status: Abnormal   Collection Time: 10/01/18  8:10 AM  Result Value Ref Range   B Natriuretic Peptide 847.7 (H) 0.0 - 100.0 pg/mL    Comment: Performed at [facility], [place] [address], [place], [place] [address]  Glucose, capillary     Status: None   Collection Time: 10/01/18 11:47 AM  Result Value Ref Range   Glucose-Capillary 84 70 - 99 mg/dL  Glucose, capillary     Status: None   Collection Time: 10/01/18  4:07 PM  Result Value Ref Range   Glucose-Capillary 97 70 - 99 mg/dL  Glucose, capillary     Status: None   Collection Time: 10/01/18  9:52 PM  Result Value Ref Range   Glucose-Capillary 95 70 - 99 mg/dL   Comment 1 Notify RN    Comment 2 Document in Chart   CBC     Status: Abnormal   Collection Time: 10/02/18  2:37 AM  Result Value Ref Range   WBC 13.6 (H) 4.0 - 10.5 K/uL   RBC 3.57 (L) 4.22 - 5.81 MIL/uL   Hemoglobin 10.2 (L) 13.0 - 17.0 g/dL   HCT 33.1 (L) 39.0 - 52.0 %   MCV 92.7 80.0 - 100.0 fL   MCH 28.6 26.0 - 34.0 pg   MCHC 30.8 30.0 - 36.0 g/dL   RDW 14.6 11.5 - 15.5 %   Platelets 128 (L) 150 - 400 K/uL    Comment: REPEATED TO VERIFY CONSISTENT WITH PREVIOUS RESULT    nRBC 0.0 0.0 - 0.2 %    Comment: Performed at [facility], [place] [address] [place], [place] [address]  Comprehensive metabolic panel     Status: Abnormal   Collection Time: 10/02/18  2:37 AM  Result Value Ref Range   Sodium 140 135 - 145 mmol/L   Potassium 3.5 3.5 - 5.1  mmol/L   Chloride 111 98 - 111 mmol/L   CO2 19 (L) 22 - 32 mmol/L   Glucose, Bld 113 (H) 70 - 99 mg/dL   BUN 28 (H) 8 - 23 mg/dL   Creatinine, Ser 1.17 0.61 - 1.24 mg/dL   Calcium 7.7 (L) 8.9 - 10.3 mg/dL   Total Protein 5.5 (L) 6.5 - 8.1 g/dL   Albumin 2.8 (L) 3.5 - 5.0 g/dL   AST 39 15 - 41 U/L   ALT 24 0 - 44 U/L   Alkaline Phosphatase 43 38 - 126 U/L   Total Bilirubin 1.4 (H) 0.3 - 1.2 mg/dL   GFR calc non Af Amer 55 (L) >60 mL/min   GFR calc Af Amer >60 >60 mL/min   Anion gap 10 5 - 15    Comment: Performed at [facility], [place] [address] [place], [place] [address]  Magnesium     Status: None   Collection Time: 10/02/18  2:37 AM  Result Value Ref Range   Magnesium 2.3 1.7 - 2.4 mg/dL    Comment: Performed at [facility], [place] [address] [place], [place] [address]  Glucose, capillary     Status: Abnormal   Collection Time: 10/02/18  8:09 AM  Result Value Ref Range   Glucose-Capillary 112 (H) 70 - 99 mg/dL  Glucose, capillary     Status: Abnormal   Collection Time: 10/02/18 11:51 AM  Result Value Ref Range   Glucose-Capillary 132 (H) 70 - 99 mg/dL    Medications:  Current Facility-Administered Medications  Medication Dose Route Frequency Provider Last Rate Last Dose  . acetaminophen (TYLENOL) tablet 650 mg  650 mg Oral Q6H PRN [place], Alvira PhilipsEric J, DO       Or  . acetaminophen (TYLENOL) suppository 650 mg  650 mg Rectal Q6H PRN [place], Eric J, DO   650 mg at 09/30/18 1007  . atorvastatin (LIPITOR) tablet 80 mg  80 mg Oral q1800 [place], Alvira Philipsric J, DO      . bisacodyl (DULCOLAX) suppository 10 mg  10 mg Rectal Daily PRN [place], Eric J, DO      . brimonidine (ALPHAGAN) 0.15 % ophthalmic solution 1 drop  1 drop Both Eyes BID [place], Alvira Philipsric J, DO   1 drop at 10/02/18 0932  . ceFEPIme (MAXIPIME) 2 g in sodium chloride 0.9 % 100 mL IVPB  2 g Intravenous Q12H Danford BadWofford, Drew A, RPH   Stopped at 10/02/18 [id number]  . Chlorhexidine Gluconate Cloth 2 %  PADS 6 each  6 each Topical Daily Regalado, Belkys A, MD   6 each at 10/02/18 0920  . diltiazem (CARDIZEM) 100 mg in dextrose 5 % 100 mL (1 mg/mL) infusion  5-15 mg/hr Intravenous Titrated Regalado, Belkys A, MD   Stopped at 10/01/18 2347  . finasteride (PROSCAR) tablet 5 mg  5 mg Oral Daily [place], Alvira Philipsric J, DO   5 mg at 10/02/18 [id number]  . furosemide (LASIX) injection 40 mg  40 mg Intravenous Daily Regalado, Belkys A, MD   40 mg at 10/02/18 0921  . heparin injection 5,000 Units  5,000 Units Subcutaneous Q8H [place], Alvira Philipsric J, DO   5,000 Units at 10/02/18 0545  . hydrALAZINE (APRESOLINE) injection 10 mg  10 mg Intravenous Q6H PRN Regalado, Belkys A, MD      . insulin aspart (novoLOG) injection 0-5 Units  0-5 Units Subcutaneous QHS [place], Eric J, DO      . insulin aspart (novoLOG) injection 0-9 Units  0-9 Units Subcutaneous TID WC [place], Eric J, DO   1 Units at 10/01/18 0818  . iohexol (OMNIPAQUE) 300 MG/ML solution 100 mL  100 mL Intravenous Once PRN [place], Eric J, DO      . levalbuterol [facility](XOPENEX) nebulizer solution 0.63 mg  0.63 mg Nebulization Q6H PRN Regalado, Belkys A, MD   0.63 mg at 10/02/18 0753  . levalbuterol (XOPENEX) nebulizer solution 0.63 mg  0.63 mg Nebulization TID Regalado, Belkys A, MD      . levothyroxine (SYNTHROID) tablet 50 mcg  50 mcg Oral Q0600 [place], Eric J, DO   50 mcg at 10/02/18 0545  . LORazepam (ATIVAN) injection 0.5 mg  0.5 mg Intravenous Q6H PRN [place], Eric J, DO   0.5 mg at 10/02/18 0036  . LORazepam (ATIVAN) injection 1 mg  1 mg Intravenous Once Regalado, Belkys A, MD   Stopped at 09/30/18 2200  . MEDLINE mouth rinse  15 mL Mouth Rinse BID [place], Eric J, DO   15 mL at 10/02/18 0940  . metoprolol tartrate (LOPRESSOR) injection 5 mg  5 mg Intravenous Q8H Regalado, Belkys A, MD   5 mg at 10/02/18 0545  .  metroNIDAZOLE (FLAGYL) IVPB 500 mg  500 mg Intravenous Q8H [place], Alvira Philipsric J, DO   Stopped at 10/02/18 [phone]  . morphine 2 MG/ML injection 2 mg  2 mg Intravenous  Q4H PRN [place], Eric J, DO   2 mg at 10/02/18 [id number]  . olopatadine (PATANOL) 0.1 % ophthalmic solution 1 drop  1 drop Both Eyes BID [place], Alvira Philipsric J, DO   1 drop at 10/02/18 0932  . ondansetron (ZOFRAN) tablet 4 mg  4 mg Oral Q6H PRN [place], Alvira Philipsric J, DO       Or  . ondansetron [facility](ZOFRAN) injection 4 mg  4 mg Intravenous Q6H PRN [place], Eric J, DO      . oxyCODONE (Oxy IR/ROXICODONE) immediate release tablet 5 mg  5 mg Oral Q4H PRN [place], Eric J, DO      . pantoprazole (PROTONIX) EC tablet 40 mg  40 mg Oral Daily [place], Alvira Philipsric J, DO   40 mg at 10/02/18 [id number]  . polyvinyl alcohol (LIQUIFILM TEARS) 1.4 % ophthalmic solution 1 drop  1 drop Both Eyes QID [place], Eric J, DO   1 drop at 10/02/18 0932  . polyvinyl alcohol (LIQUIFILM TEARS) 1.4 % ophthalmic solution 2 drop  2 drop Both Eyes QID PRN [place], Eric J, DO      . senna-docusate (Senokot-S) tablet 1 tablet  1 tablet Oral QHS PRN [place], Alvira Philipsric J, DO      . tamsulosin (FLOMAX) capsule 0.4 mg  0.4 mg Oral Daily [place], Alvira Philipsric J, DO   0.4 mg at 10/02/18 [id number]  . vancomycin (VANCOCIN) 1,250 mg in sodium chloride 0.9 % 250 mL IVPB  1,250 mg Intravenous Q24H Danford BadWofford, Drew A, [facility]   Stopped at 10/02/18 0015    Musculoskeletal: Strength & Muscle Tone: Atrophy with normal aging. Gait & Station: UTA since patient is lying in bed. Patient leans: N/A  Psychiatric Specialty Exam: Physical Exam  Nursing note and vitals reviewed. Constitutional: He appears well-developed and well-nourished.  HENT:  Head: Normocephalic and atraumatic.  Neck: Normal range of motion.  Respiratory: Effort normal.  Musculoskeletal: Normal range of motion.  Neurological: He is alert.  Patient is only oriented to self.   Psychiatric: His affect is blunt. Cognition and memory are impaired. He expresses impulsivity.  Patient will not participate in interview.     Review of Systems  Unable to perform ROS: Mental status change    Blood pressure (!) 144/73, pulse (!) 135,  temperature 97.6 F (36.4 C), temperature source Axillary, resp. rate (!) 27, height 6' (1.829 m), weight 77.1 kg, SpO2 100 %.Body mass index is 23.06 kg/m.  General Appearance: Fairly Groomed, elderly, Caucasian male wearing NRB who is lying in bed. NAD.    Eye Contact:  Fair  Speech:  Slow  Volume:  Decreased  Mood:  Dysphoric  Affect:  Congruent  Thought Process:  NA  Orientation:  Other:  Oriented to self only.  Thought Content:  UTA since patient will not participate in interview.   Suicidal Thoughts:  UTA since patient will not participate in interview.   Homicidal Thoughts:  UTA since patient will not participate in interview.   Memory:  Immediate;   UTA since patient will not participate in interview.  Recent;   UTA since patient will not participate in interview.  Remote;   UTA since patient will not participate in interview.   Judgement:  NA  Insight:  NA  Psychomotor Activity:  Decreased  Concentration:  Concentration: UTA since patient will  not participate in interview.  and Attention Span: UTA since patient will not participate in interview.   Recall:  UTA since patient will not participate in interview.   Fund of Knowledge:  UTA since patient will not participate in interview.   Language:  UTA since patient will not participate in interview.   Akathisia:  No  Handed:  Right  AIMS (if indicated):   N/A  Assets:  Housing  ADL's:  Impaired  Cognition:  Impaired due to AMS. History of dementia.   Sleep:   N/A   Assessment:  [name] is a 85 y.o. male who was admitted with sepsis of unknown etiology. Patient appears drowsy during interview and is unable to be engaged in interview. Primary team reports ongoing agitation in the setting of AMS. He has received PRN Haldol and Ativan. Recommend a standing dose of Zyprexa for agitation until his mental status improves. Recommend avoiding deliriogenic medications which may worsen mental status.   Treatment Plan  Summary: -Start Zyprexa Zydis 2.5 mg qhs for agitation and 2.5 mg daily PRN for agitation.  -EKG reviewed and QTc 439 on 7/7. Please closely monitor when starting or increasing QTc prolonging agents.  -Psychiatry will sign off on patient at this time. Please consult psychiatry again as needed.   Disposition: Patient does not meet criteria for psychiatric inpatient admission.  This service was provided via telemedicine using a 2-way, interactive audio and video technology.  Names of all persons participating in this telemedicine service and their role in this encounter. Name: Juanetta Beets, DO Role: Psychiatrist   Name: [name] Role: Patient    Cherly Beach, DO 10/02/2018 12:38 PM

## 2018-10-02 NOTE — Progress Notes (Signed)
PHARMACY NOTE -  Cefepime  Pharmacy has been assisting with dosing of cefepime for PNA.  Dosage remains stable at 2g IV q12 hr  Cultures remain clear  Pharmacy will sign off, following peripherally for culture results or dose adjustments. Please reconsult if a change in clinical status warrants re-evaluation of dosage.  Reuel Boom, PharmD, BCPS [phone] 10/02/2018, 2:46 PM

## 2018-10-02 NOTE — Progress Notes (Signed)
Pt. Very agitated and pulling mask off of face. Oxygen demand increased earlier this am after patient desated while being weaned to 7 L. He is currently on 15 L NRB. When agitated HR ranges between 130's-150's. He is actively trying to climb out of bed even in restraints. He screams frequently as well as pulling at tubes and equipment near him. He has not slept for more than hour for this RN's 2 shift and according to the previous nurses he hasn't slept at night either. He seems to be experiencing delirium on top of baseline dementia. Ativan, Haldol and Morphine have not helped patient have relief from agitation and restlessness. MD is aware of patient status and has consulted Psych and 1 mg Ativan ordered.

## 2018-10-02 NOTE — Progress Notes (Signed)
0510 Cardizem push non-effective for decreasing HR, consulted with charge nurse and advised to notify on call to see if restart of cardizem drip is needed. Paged and awaiting call back.

## 2018-10-02 NOTE — Progress Notes (Signed)
  Speech Language Pathology Treatment: Dysphagia  Patient Details Name: [name] MRN: [id number] DOB: 06-23-1927 Today's Date: 10/02/2018 Time: 0935-1000 SLP Time Calculation (min) (ACUTE ONLY): 25 min  Assessment / Plan / Recommendation Clinical Impression  Patient seen to address dysphagia goals with RN in at beginning of session giving him meds crushed in puree. Patient opened mouth to accept PO's without needing significant cues and although SLP suspects a mild swallow initiation delay, he exhibited good laryngeal elevation and no oral residuals post swallows. He drank teaspoon sips of thin liquids (water) without overt s/s of aspiration. He continues to keep eyes closed most of the time but will respond verbally to basic needs questions and make basic requests, "Get this glove off me", etc. His heart rate became elevated to upper 130's and his SpO2 dropped from high 90's to high 70's when he started to become agitated and wanted to get out of the bed. SLP is not recommending any changes to NPO status, recommend continue with meds crushed in puree and allow ice chips or small sips of water after oral care if alert. Will continue to follow.    HPI HPI: Patient is an 84 y.o. male with PMH: presumed dementia, afib, COPD, CVA, DM-2, HTN, hearing impairment, hypothyroidism, CAD and glaucoma who was admitted on 7/5 secondary to c/o abdominal pain and low bak pain over 2 days prior. Patient was recent admission from 6/21-6/24 with PNA and CHF.      SLP Plan  Continue with current plan of care       Recommendations  Diet recommendations: NPO Medication Administration: Crushed with puree Compensations: Minimize environmental distractions;Slow rate;Small sips/bites Postural Changes and/or Swallow Maneuvers: Seated upright 90 degrees                Oral Care Recommendations: Oral care QID Follow up Recommendations: Other (comment);Skilled Nursing facility;24 hour  supervision/assistance(TBD) SLP Visit Diagnosis: Dysphagia, unspecified (R13.10) Plan: Continue with current plan of care       GO                Dannial Monarch 10/02/2018, 12:46 PM    Sonia Baller, MA, [facility] Pager: [phone]

## 2018-10-02 NOTE — Progress Notes (Signed)
Tele monitoring reported that patient had run of wide complex SVT, reviewed with charge nurse. Patient's tele history shows that patient has had irregularities throughout admission. Patient without complaints. Will continue to monitor.

## 2018-10-02 NOTE — Progress Notes (Signed)
Marland Kitchen.  PROGRESS NOTE    [name]  ZOX:[id number] DOB: 04/12/1927 DOA: 09/29/2018 PCP: Bing NeighborsHarris, Kimberly S, FNP   Brief Narrative:   85 year old with past medical history significant for presumed dementia, A. fib, COPD, CVA, type 2 diabetes, hypertension, hearing impairment, hypothyroidism, coronary artery disease and glaucoma resents from home with progressive confusion.  Patient is unable to participate in HPI due to altered mental status.  Per family patient has been complaining of abdominal pain and low back pain over the last couple of days.  Evaluation in the ED temperature 103, heart rate 115, respiration rate 34, blood pressure 115/44 oxygen sat 92 on room air.  White blood cell 22 hemoglobin 13.  Chest x-ray no acute cardiopulmonary disease [name] test negative.  CT abdomen and pelvis notable for possible distended gallbladder.  Right upper quadrant ultrasound noted left lobe liver cyst no gallstones and minimal pericholecystic fluid noted.  Patient was admitted with sepsis of unclear etiology, acute metabolic and toxic encephalopathy.  Troponins has been increasing.  Discussed with cardiology on this level of troponin are likely related to sepsis.  General surgery was consulted and they are recommending HIDA scan, if positive patient will require percutaneous cholecystostomy.   Assessment & Plan:   Active Problems:   HTN (hypertension)   Atrial fibrillation (HCC)   Altered mental status   Sepsis (HCC)   Severe Sepsis     - PNA vs acalculous cholecystitis.       - Patient presented with fever, tachycardia, leukocytosis.     - chest x ray with asymmetric infiltrates      - Bld C: NTD     - CT abdomen; possible distended gallbladder.      - US; mild pericholecystic fluid.      - HIDA scan pending     - cefepime, flagyl, vanc; can d/c vanc today     - CT lumbar spine ordered. Patient has not been  able to cooperate for study.      - previous physician discussed with Dr  Cliffton AstersWhite, will hold getting HIDA scan until respiratory status is more stable.  Acute Metabolic Encephalopathy      - Related to acute illness     - PRN Ativan; prolonged QT, watch haldol, etc     - Liver function test normal.  Acute Hypoxic respiratory failure      - initial CXR with asymmetric edema vs infection; repeat CXR 7/8 shows improving aeration      - BNP is 847; see below     - IV lasix.      - has required NRB after periods of agitation, will speak with PCCM     - check ABG  HFpEF     - Echo 07/10/18 shows left diastolic function c/w impaired relaxation     - home meds include lisinopril, metoprolol, lasix     - BNP 847 (724 last month)     - repeat echo, continue lasix   A fib RVR      - metoprolol IV      - now off cardizem gtt.     - home meds eliquis, metoprolol; not taking eliquis d/t cost    Elevation troponin      - Likely demand to ischemia, secondary to sepsis.     - prior physician discussed with cardiology: supportive care, treat underlying sepsis physiology.  History of COPD:     - Nebulizer as needed.     -  on O2 at night at home  Hypertension     - he has been hypotensive, hold amlodipine and lisinopril.  History of CVA     - continue with atorvastatin  Hypothyroidism     - continue with Synthroid  Glaucoma      - continue with home eyedrops.  Diabetes type 2:     - on metformin at home.     - continue SSI  Hypomagnesemia     - replete, monitor  Repeat CXR is improved. Continue lasix. He's had intermittent periods of agitation. When he gets agitated, HR goes up and he desats. Check ABG. Son says he has some element of baseline confusion secondary to previous CVAs. Continue sepsis treatment. Hopefully that will improve the confusion. Have ask psyc to assist w/ agitation as well. As far as his resp status, he had to go on NRB this AM, and is now on partial NRB. Continue lasix. Let's see if that help the resp status; if not, will speak  with PCCM. He is DNR, so limited on what we can do on that front. Continue metoprolol IV.     DVT prophylaxis: SCDs Code Status: DNR Family Communication: Spoke with son by phone 1215hrs   Disposition Plan: TBD   Consultants:   General Surgery   Antimicrobials:   Cefepime, flagyl    Subjective: "I want some water."  Objective: Vitals:   10/02/18 0753 10/02/18 0800 10/02/18 0805 10/02/18 1000  BP:  (!) 154/83  (!) 144/73  Pulse:  (!) 125  (!) 135  Resp:  (!) 28  (!) 27  Temp:   97.6 F (36.4 C)   TempSrc:   Axillary   SpO2: 98% (!) 89% 97% 100%  Weight:      Height:        Intake/Output Summary (Last 24 hours) at 10/02/2018 1156 Last data filed at 10/02/2018 [id number] Gross per 24 hour  Intake 752.42 ml  Output 1475 ml  Net -722.58 ml   Filed Weights   09/29/18 1935  Weight: 77.1 kg    Examination:  General: 85 y.o. male resting in bed in NAD Cardiovascular: tachy, +S1, S2, no m/g/r, equal pulses throughout Respiratory: decreased at bases, slight rhonchi right anteriorly GI: BS+, NDNT, no masses noted, no organomegaly noted MSK: No e/c/c Skin: No rashes, bruises, ulcerations noted Neuro: Alert to name, follows some commands    Data Reviewed: I have personally reviewed following labs and imaging studies.  CBC: Recent Labs  Lab 09/29/18 1543 09/30/18 0211 09/30/18 0742 10/01/18 0209 10/02/18 0237  WBC 22.9* 29.1* 25.6* 21.0* 13.6*  NEUTROABS 21.0*  --   --   --   --   HGB 13.0 11.1* 11.1* 9.8* 10.2*  HCT 41.8 35.1* 34.7* 31.3* 33.1*  MCV 89.1 89.5 91.1 92.3 92.7  PLT 184 163 163 129* 128*   Basic Metabolic Panel: Recent Labs  Lab 09/29/18 1543 09/30/18 0211 09/30/18 0618 09/30/18 0742 10/01/18 0209 10/01/18 0810 10/02/18 0237  NA 136  --  138  --  140  --  140  K 4.0  --  4.3  --  4.0  --  3.5  CL 101  --  108  --  111  --  111  CO2 22  --  20*  --  21*  --  19*  GLUCOSE 139*  --  100*  --  124*  --  113*  BUN 14  --  16  --  22  --  28*    CREATININE 0.83 0.92 0.92  --  1.07  --  1.17  CALCIUM 9.2  --  8.4*  --  7.7*  --  7.7*  MG  --   --   --  1.3* 1.4*  --  2.3  PHOS  --   --   --   --   --  2.5  --    GFR: Estimated Creatinine Clearance: 46.7 mL/min (by C-G formula based on SCr of 1.17 mg/dL). Liver Function Tests: Recent Labs  Lab 09/29/18 1543 09/30/18 0618 10/02/18 0237  AST 23 31 39  ALT 21 19 24   ALKPHOS 60 41 43  BILITOT 0.8 0.8 1.4*  PROT 7.0 5.6* 5.5*  ALBUMIN 4.2 3.3* 2.8*   Recent Labs  Lab 09/29/18 1543  LIPASE 32   No results for input(s): AMMONIA in the last 168 hours. Coagulation Profile: Recent Labs  Lab 09/30/18 0211  INR 1.3*   Cardiac Enzymes: No results for input(s): CKTOTAL, CKMB, CKMBINDEX, TROPONINI in the last 168 hours. BNP (last 3 results) No results for input(s): PROBNP in the last 8760 hours. HbA1C: Recent Labs    09/30/18 0211  HGBA1C 7.2*   CBG: Recent Labs  Lab 10/01/18 1147 10/01/18 1607 10/01/18 2152 10/02/18 0809 10/02/18 1151  GLUCAP 84 97 95 112* 132*   Lipid Profile: No results for input(s): CHOL, HDL, LDLCALC, TRIG, CHOLHDL, LDLDIRECT in the last 72 hours. Thyroid Function Tests: Recent Labs    09/30/18 0211  TSH 1.023   Anemia Panel: No results for input(s): VITAMINB12, FOLATE, FERRITIN, TIBC, IRON, RETICCTPCT in the last 72 hours. Sepsis Labs: Recent Labs  Lab 09/29/18 1700 09/30/18 0211 09/30/18 0618 10/01/18 0810  PROCALCITON  --  2.36  --   --   LATICACIDVEN 1.8 1.7 2.5* 1.4    Recent Results (from the past 240 hour(s))  Culture, blood (routine x 2)     Status: None (Preliminary result)   Collection Time: 09/29/18  3:43 PM   Specimen: BLOOD  Result Value Ref Range Status   Specimen Description   Final    BLOOD BLOOD LEFT FOREARM Performed at [facility], [address] [place], [place] [address]    Special Requests   Final    BOTTLES DRAWN AEROBIC AND ANAEROBIC Blood Culture results may not be optimal  due to an inadequate volume of blood received in culture bottles Performed at [facility], [address] [place], [place] [address]    Culture   Final    NO GROWTH 3 DAYS Performed at [facility], [address] [place], [place] [address]    Report Status PENDING  Incomplete  Culture, blood (routine x 2)     Status: None (Preliminary result)   Collection Time: 09/29/18  3:43 PM   Specimen: BLOOD  Result Value Ref Range Status   Specimen Description   Final    BLOOD BLOOD RIGHT FOREARM Performed at [facility], [address] [place], [place] [address]    Special Requests   Final    BOTTLES DRAWN AEROBIC AND ANAEROBIC Blood Culture adequate volume Performed at [facility], [address] [place], [place] [address]    Culture   Final    NO GROWTH 3 DAYS Performed at [facility], [address] [place], [place] [address]    Report Status PENDING  Incomplete  SARS Coronavirus 2 (CEPHEID- Performed in [facility]  hospital lab), Hosp Order     Status: None   Collection Time: 09/29/18  3:44 PM   Specimen: Nasopharyngeal Swab  Result Value Ref Range Status   SARS Coronavirus 2 NEGATIVE NEGATIVE Final    Comment: (NOTE) If result is NEGATIVE SARS-CoV-2 target nucleic acids are NOT DETECTED. The SARS-CoV-2 RNA is generally detectable in upper and lower  respiratory specimens during the acute phase of infection. The lowest  concentration of SARS-CoV-2 viral copies this assay can detect is 250  copies / mL. A negative result does not preclude SARS-CoV-2 infection  and should not be used as the sole basis for treatment or other  patient management decisions.  A negative result may occur with  improper specimen collection / handling, submission of specimen other  than nasopharyngeal swab, presence of viral mutation(s) within the  areas targeted by this assay, and inadequate number of viral copies  (<250 copies  / mL). A negative result must be combined with clinical  observations, patient history, and epidemiological information. If result is POSITIVE SARS-CoV-2 target nucleic acids are DETECTED. The SARS-CoV-2 RNA is generally detectable in upper and lower  respiratory specimens dur ing the acute phase of infection.  Positive  results are indicative of active infection with SARS-CoV-2.  Clinical  correlation with patient history and other diagnostic information is  necessary to determine patient infection status.  Positive results do  not rule out bacterial infection or co-infection with other viruses. If result is PRESUMPTIVE POSTIVE SARS-CoV-2 nucleic acids MAY BE PRESENT.   A presumptive positive result was obtained on the submitted specimen  and confirmed on repeat testing.  While 2019 novel coronavirus  (SARS-CoV-2) nucleic acids may be present in the submitted sample  additional confirmatory testing may be necessary for epidemiological  and / or clinical management purposes  to differentiate between  SARS-CoV-2 and other Sarbecovirus currently known to infect humans.  If clinically indicated additional testing with an alternate test  methodology [phone]) is advised. The SARS-CoV-2 RNA is generally  detectable in upper and lower respiratory sp ecimens during the acute  phase of infection. The expected result is Negative. Fact Sheet for Patients:  BoilerBrush.com.cy Fact Sheet for Healthcare Providers: https://pope.com/ This test is not yet approved or cleared by the [place] FDA and has been authorized for detection and/or diagnosis of SARS-CoV-2 by FDA under an Emergency Use Authorization (EUA).  This EUA will remain in effect (meaning this test can be used) for the duration of the COVID-19 declaration under Section 564(b)(1) of the Act, 21 U.S.C. section 360bbb-3(b)(1), unless the authorization is terminated or revoked  sooner. Performed at [facility], [address], [place], [place] [address]   MRSA PCR Screening     Status: None   Collection Time: 09/30/18 12:22 AM   Specimen: Nasal Mucosa; Nasopharyngeal  Result Value Ref Range Status   MRSA by PCR NEGATIVE NEGATIVE Final    Comment:        The GeneXpert MRSA Assay (FDA approved for NASAL specimens only), is one component of a comprehensive MRSA colonization surveillance program. It is not intended to diagnose MRSA infection nor to guide or monitor treatment for MRSA infections. Performed at [facility], [address], [place], [place] [address]          Radiology Studies: Dg Chest Port 1 View  Result Date: 10/02/2018 CLINICAL DATA:  Edema. EXAM: PORTABLE CHEST 1 VIEW COMPARISON:  One-view chest x-ray 10/01/2018 FINDINGS: The heart size  is normal. Diffuse interstitial and airspace disease is improved. There is improved aeration at both lung bases. IMPRESSION: 1. Improving aeration of both lungs with residual diffuse interstitial and airspace disease likely representing edema or infection. Electronically Signed   By: San Morelle M.D.   On: 10/02/2018 07:24   Dg Chest Port 1 View  Result Date: 10/01/2018 CLINICAL DATA:  85 year old male with hypoxemia, altered mental status EXAM: PORTABLE CHEST 1 VIEW COMPARISON:  Prior chest x-ray 09/29/2018 FINDINGS: Interval development of extensive asymmetric interstitial airspace opacities throughout the entirety of the right lung. This is superimposed on a background of mild cardiomegaly and pulmonary vascular congestion. No pneumothorax or large pleural effusion visualized. No acute osseous abnormality. IMPRESSION: Interval development of extensive asymmetric interstitial airspace opacities throughout the right lung compared to 09/29/2018. There is a background of cardiomegaly and vascular congestion. Differential considerations include asymmetric pulmonary  edema versus atypical or viral pneumonia. Electronically Signed   By: Jacqulynn Cadet M.D.   On: 10/01/2018 09:06    Scheduled Meds:  atorvastatin  80 mg Oral q1800   brimonidine  1 drop Both Eyes BID   Chlorhexidine Gluconate Cloth  6 each Topical Daily   finasteride  5 mg Oral Daily   furosemide  40 mg Intravenous Daily   heparin  5,000 Units Subcutaneous Q8H   insulin aspart  0-5 Units Subcutaneous QHS   insulin aspart  0-9 Units Subcutaneous TID WC   levalbuterol  0.63 mg Nebulization TID   levothyroxine  50 mcg Oral Q0600   LORazepam  1 mg Intravenous Once   mouth rinse  15 mL Mouth Rinse BID   metoprolol tartrate  5 mg Intravenous Q8H   olopatadine  1 drop Both Eyes BID   pantoprazole  40 mg Oral Daily   polyvinyl alcohol  1 drop Both Eyes QID   tamsulosin  0.4 mg Oral Daily   Continuous Infusions:  ceFEPime (MAXIPIME) IV Stopped (10/02/18 0951)   diltiazem (CARDIZEM) infusion Stopped (10/01/18 2347)   metronidazole Stopped (10/02/18 0656)   vancomycin Stopped (10/02/18 0015)     LOS: 3 days    Time spent: 45 minutes spent in the coordination of care today.    Jonnie Finner, DO [facility] Pager [phone]  If 7PM-7AM, please contact night-coverage [email or web address] Password [email or web address] 10/02/2018, 11:56 AM

## 2018-10-02 NOTE — Progress Notes (Signed)
  Echocardiogram 2D Echocardiogram has been performed.  [name] 10/02/2018, 2:01 PM

## 2018-10-02 NOTE — Progress Notes (Signed)
When reviewing chart noticed restraint order will expire at midnight.  Discussed with primary nurse Christy.  Restraints are still needed to keep lines and tubes in place.  Page placed to [facility] to renew restraints.  Jacqulyn Ducking ICU/SD RN4 / Care Coordinator / Rapid Response Nurse Rapid Response Number:  [phone]

## 2018-10-03 ENCOUNTER — Inpatient Hospital Stay: Payer: Medicare Other | Admitting: Family Medicine

## 2018-10-03 ENCOUNTER — Inpatient Hospital Stay (HOSPITAL_COMMUNITY): Payer: Medicare Other

## 2018-10-03 DIAGNOSIS — R0902 Hypoxemia: Secondary | ICD-10-CM

## 2018-10-03 DIAGNOSIS — R410 Disorientation, unspecified: Secondary | ICD-10-CM

## 2018-10-03 DIAGNOSIS — Z515 Encounter for palliative care: Secondary | ICD-10-CM

## 2018-10-03 DIAGNOSIS — Z7189 Other specified counseling: Secondary | ICD-10-CM

## 2018-10-03 DIAGNOSIS — J9601 Acute respiratory failure with hypoxia: Secondary | ICD-10-CM

## 2018-10-03 DIAGNOSIS — J690 Pneumonitis due to inhalation of food and vomit: Secondary | ICD-10-CM

## 2018-10-03 LAB — CBC WITH DIFFERENTIAL/PLATELET
Abs Immature Granulocytes: 0.06 10*3/uL (ref 0.00–0.07)
Basophils Absolute: 0 10*3/uL (ref 0.0–0.1)
Basophils Relative: 0 %
Eosinophils Absolute: 0.1 10*3/uL (ref 0.0–0.5)
Eosinophils Relative: 1 %
HCT: 35.9 % — ABNORMAL LOW (ref 39.0–52.0)
Hemoglobin: 10.9 g/dL — ABNORMAL LOW (ref 13.0–17.0)
Immature Granulocytes: 1 %
Lymphocytes Relative: 6 %
Lymphs Abs: 0.6 10*3/uL — ABNORMAL LOW (ref 0.7–4.0)
MCH: 28.2 pg (ref 26.0–34.0)
MCHC: 30.4 g/dL (ref 30.0–36.0)
MCV: 92.8 fL (ref 80.0–100.0)
Monocytes Absolute: 0.7 10*3/uL (ref 0.1–1.0)
Monocytes Relative: 7 %
Neutro Abs: 8.8 10*3/uL — ABNORMAL HIGH (ref 1.7–7.7)
Neutrophils Relative %: 85 %
Platelets: 141 10*3/uL — ABNORMAL LOW (ref 150–400)
RBC: 3.87 MIL/uL — ABNORMAL LOW (ref 4.22–5.81)
RDW: 14.6 % (ref 11.5–15.5)
WBC: 10.3 10*3/uL (ref 4.0–10.5)
nRBC: 0 % (ref 0.0–0.2)

## 2018-10-03 LAB — RENAL FUNCTION PANEL
Albumin: 2.9 g/dL — ABNORMAL LOW (ref 3.5–5.0)
Anion gap: 14 (ref 5–15)
BUN: 33 mg/dL — ABNORMAL HIGH (ref 8–23)
CO2: 18 mmol/L — ABNORMAL LOW (ref 22–32)
Calcium: 7.8 mg/dL — ABNORMAL LOW (ref 8.9–10.3)
Chloride: 109 mmol/L (ref 98–111)
Creatinine, Ser: 0.99 mg/dL (ref 0.61–1.24)
GFR calc Af Amer: >60 mL/min (ref >60)
GFR calc non Af Amer: >60 mL/min (ref >60)
Glucose, Bld: 120 mg/dL — ABNORMAL HIGH (ref 70–99)
Phosphorus: 1.5 mg/dL — ABNORMAL LOW (ref 2.5–4.6)
Potassium: 3.4 mmol/L — ABNORMAL LOW (ref 3.5–5.1)
Sodium: 141 mmol/L (ref 135–145)

## 2018-10-03 LAB — GLUCOSE, CAPILLARY
Glucose-Capillary: 134 mg/dL — ABNORMAL HIGH (ref 70–99)
Glucose-Capillary: 122 mg/dL — ABNORMAL HIGH (ref 70–99)
Glucose-Capillary: 137 mg/dL — ABNORMAL HIGH (ref 70–99)

## 2018-10-03 LAB — MAGNESIUM: Magnesium: 2.2 mg/dL (ref 1.7–2.4)

## 2018-10-03 MED ORDER — POTASSIUM PHOSPHATES 15 MMOLE/5ML IV SOLN
30.0000 mmol | Freq: Once | INTRAVENOUS | Status: AC
  Administered 2018-10-03: 08:00:00 30 mmol via INTRAVENOUS
  Filled 2018-10-03: qty 10

## 2018-10-03 MED ORDER — METOPROLOL TARTRATE 5 MG/5ML IV SOLN
5.0000 mg | Freq: Once | INTRAVENOUS | Status: AC
  Administered 2018-10-03: 5 mg via INTRAVENOUS
  Filled 2018-10-03: qty 5

## 2018-10-03 MED ORDER — MORPHINE SULFATE (CONCENTRATE) 10 MG/0.5ML PO SOLN
5.0000 mg | ORAL | Status: DC | PRN
  Administered 2018-10-03 – 2018-10-04 (×3): 5 mg via SUBLINGUAL
  Filled 2018-10-03 (×3): qty 0.5

## 2018-10-03 MED ORDER — LORAZEPAM 0.5 MG PO TABS
0.5000 mg | ORAL_TABLET | ORAL | Status: DC | PRN
  Administered 2018-10-04 (×2): 0.5 mg via SUBLINGUAL
  Filled 2018-10-03 (×2): qty 1

## 2018-10-03 NOTE — Progress Notes (Signed)
Marland Kitchen.  PROGRESS NOTE    [name]  FAO:[id number] DOB: 07/17/1927 DOA: 09/29/2018 PCP: Bing NeighborsHarris, Kimberly S, FNP   Brief Narrative:   85 year old with past medical history significant for presumed dementia, A. fib, COPD, CVA, type 2 diabetes, hypertension, hearing impairment, hypothyroidism, coronary artery disease and glaucoma resents from home with progressive confusion. Patient is unable to participate in HPI due to altered mental status. Per family patient has been complaining of abdominal pain and low back pain over the last couple of days.  Evaluation in the ED temperature 103, heart rate 115, respiration rate 34, blood pressure 115/44 oxygen sat 92 on room air. White blood cell 22 hemoglobin 13. Chest x-ray no acute cardiopulmonary disease [name] test negative. CT abdomen and pelvis notable for possible distended gallbladder. Right upper quadrant ultrasound noted left lobe liver cyst no gallstones and minimal pericholecystic fluid noted.  Patient was admitted with sepsis of unclear etiology, acute metabolic and toxic encephalopathy. Troponins has been increasing. Discussed with cardiology on this level of troponin are likely related to sepsis.  General surgery was consulted and they are recommending HIDA scan, if positive patient will require percutaneous cholecystostomy.   Assessment & Plan:   Principal Problem:   Altered mental status Active Problems:   HTN (hypertension)   Atrial fibrillation (HCC)   Sepsis (HCC)   Severe Sepsis     - PNA vs acalculous cholecystitis.       - Patient presented with fever, tachycardia, leukocytosis.     - chest x ray with asymmetric infiltrates      - Bld C: NTD     - CT abdomen; possible distended gallbladder.      - US; mild pericholecystic fluid.      - HIDA scan pending     - cefepime, flagyl, vanc; can d/c vanc today     - CT lumbar spine ordered. Patient has not been  able to cooperate for study.      - previous physician  discussed with Dr Cliffton AstersWhite, will hold getting HIDA scan until respiratory status is more stable.     - concern that he is aspirating; spoke with PCCM, they feel the same; spoke with son about his current status; he would like to speak with palliative care about hopsice and options; I have consulted them  Acute Metabolic Encephalopathy      - Related to acute illness     - PRN Ativan; prolonged QT, watch haldol, etc     - Liver function test normal.     - consulted psyc, appreciate assistance, low dose zyprexa started  Acute Hypoxic respiratory failure      - initial CXR with asymmetric edema vs infection; repeat CXR 7/8 shows improving aeration      - BNP is 847; see below     - IV lasix.      - has required NRB after periods of agitation, will speak with PCCM     - pO2 is low on ABG, he's not a candidate for escalation of O2 support; have discussed w/ son, would like to speak with palliative care about hospice options  HFpEF     - Echo 07/10/18 shows left diastolic function c/w impaired relaxation     - home meds include lisinopril, metoprolol, lasix     - BNP 847 (724 last month)     - repeat echo, continue lasix     - echo results noted   A fib RVR      -  metoprolol IV      - now off cardizem gtt.     - home meds eliquis, metoprolol; not taking eliquis d/t cost    Elevation troponin      - Likely demand to ischemia, secondary to sepsis.     - prior physician discussed with cardiology: supportive care, treat underlying sepsis physiology.  History of COPD:     - Nebulizer as needed.     - on O2 at night at home  Hypertension     - he has been hypotensive, hold amlodipine and lisinopril.     - BP is all over the place; monitor for now  History of CVA     - continue with atorvastatin  Hypothyroidism     - continue with Synthroid  Glaucoma      - continue with home eyedrops.  Diabetes type 2:     - on metformin at home.     - continue SSI  Hypomagnesemia      - replete, monitor  Ultimately, I think we have run out of options. ABG shows pO2 in 40's. He's not a candidate for further escalation of O2 support. Discussed w/ PCCM. We are concerned that he is aspirating. Spoke with son. He says that his father wouldn't be someone who wanted a PEG tube. The tube itself wouldn't solve the aspiration problem. I think comfort care is probably his best option. His son would like to speak with palliative care about hospice and his options. PC has been consulted.    DVT prophylaxis: SCDs Code Status: DNR Family Communication: Spoke with son by phone 1045hrs   Disposition Plan: TBD   Consultants:   General Surgery  PCCM  Psychiatry   Antimicrobials:   Cefepime, flagyl    Subjective: Less agitated this AM  Objective: Vitals:   10/03/18 0632 10/03/18 0700 10/03/18 0725 10/03/18 0800  BP:  (!) 151/95    Pulse: (!) 117 (!) 125    Resp: (!) 29 (!) 31    Temp:    97.9 F (36.6 C)  TempSrc:    Oral  SpO2: 97% 97% 94%   Weight:      Height:        Intake/Output Summary (Last 24 hours) at 10/03/2018 1054 Last data filed at 10/03/2018 0865 Gross per 24 hour  Intake 369.52 ml  Output 750 ml  Net -380.48 ml   Filed Weights   09/29/18 1935  Weight: 77.1 kg    Examination:  General: 85 y.o. ill appearing male resting in bed in NAD Cardiovascular: tachy, +S1, S2, no m/g/r, equal pulses throughout Respiratory: decreased at bases, rhonchi b/l anteriorly GI: BS+, NDNT, no masses noted, no organomegaly noted MSK: No e/c/c Skin: No rashes, bruises, ulcerations noted Neuro: drowsy, tracks around room, follows some commands   Data Reviewed: I have personally reviewed following labs and imaging studies.  CBC: Recent Labs  Lab 09/29/18 1543 09/30/18 0211 09/30/18 0742 10/01/18 0209 10/02/18 0237 10/03/18 0218  WBC 22.9* 29.1* 25.6* 21.0* 13.6* 10.3  NEUTROABS 21.0*  --   --   --   --  8.8*  HGB 13.0 11.1* 11.1* 9.8* 10.2* 10.9*  HCT  41.8 35.1* 34.7* 31.3* 33.1* 35.9*  MCV 89.1 89.5 91.1 92.3 92.7 92.8  PLT 184 163 163 129* 128* 784*   Basic Metabolic Panel: Recent Labs  Lab 09/29/18 1543 09/30/18 0211 09/30/18 0618 09/30/18 6962 10/01/18 0209 10/01/18 0810 10/02/18 0237 10/03/18 0218  NA 136  --  138  --  140  --  140 141  K 4.0  --  4.3  --  4.0  --  3.5 3.4*  CL 101  --  108  --  111  --  111 109  CO2 22  --  20*  --  21*  --  19* 18*  GLUCOSE 139*  --  100*  --  124*  --  113* 120*  BUN 14  --  16  --  22  --  28* 33*  CREATININE 0.83 0.92 0.92  --  1.07  --  1.17 0.99  CALCIUM 9.2  --  8.4*  --  7.7*  --  7.7* 7.8*  MG  --   --   --  1.3* 1.4*  --  2.3 2.2  PHOS  --   --   --   --   --  2.5  --  1.5*   GFR: Estimated Creatinine Clearance: 55.2 mL/min (by C-G formula based on SCr of 0.99 mg/dL). Liver Function Tests: Recent Labs  Lab 09/29/18 1543 09/30/18 0618 10/02/18 0237 10/03/18 0218  AST 23 31 39  --   ALT --   ALKPHOS 60 41 43  --   BILITOT 0.8 0.8 1.4*  --   PROT 7.0 5.6* 5.5*  --   ALBUMIN 4.2 3.3* 2.8* 2.9*   Recent Labs  Lab 09/29/18 1543  LIPASE 32   No results for input(s): AMMONIA in the last 168 hours. Coagulation Profile: Recent Labs  Lab 09/30/18 0211  INR 1.3*   Cardiac Enzymes: No results for input(s): CKTOTAL, CKMB, CKMBINDEX, TROPONINI in the last 168 hours. BNP (last 3 results) No results for input(s): PROBNP in the last 8760 hours. HbA1C: No results for input(s): HGBA1C in the last 72 hours. CBG: Recent Labs  Lab 10/02/18 1151 10/02/18 1554 10/02/18 2136 10/02/18 2340 10/03/18 0829  GLUCAP 132* 86 99 103* 134*   Lipid Profile: No results for input(s): CHOL, HDL, LDLCALC, TRIG, CHOLHDL, LDLDIRECT in the last 72 hours. Thyroid Function Tests: No results for input(s): TSH, T4TOTAL, FREET4, T3FREE, THYROIDAB in the last 72 hours. Anemia Panel: No results for input(s): VITAMINB12, FOLATE, FERRITIN, TIBC, IRON, RETICCTPCT in the last 72  hours. Sepsis Labs: Recent Labs  Lab 09/29/18 1700 09/30/18 0211 09/30/18 0618 10/01/18 0810  PROCALCITON  --  2.36  --   --   LATICACIDVEN 1.8 1.7 2.5* 1.4    Recent Results (from the past 240 hour(s))  Culture, blood (routine x 2)     Status: None (Preliminary result)   Collection Time: 09/29/18  3:43 PM   Specimen: BLOOD  Result Value Ref Range Status   Specimen Description   Final    BLOOD BLOOD LEFT FOREARM Performed at [facility], [address] [place], [place] [address]    Special Requests   Final    BOTTLES DRAWN AEROBIC AND ANAEROBIC Blood Culture results may not be optimal due to an inadequate volume of blood received in culture bottles Performed at [facility], [address], [place], [place] [address]    Culture   Final    NO GROWTH 3 DAYS Performed at [facility], [address], [place], [place] [address]    Report Status PENDING  Incomplete  Culture, blood (routine x 2)     Status: None (Preliminary result)   Collection Time: 09/29/18  3:43 PM   Specimen: BLOOD  Result Value Ref Range Status   Specimen Description   Final    BLOOD BLOOD RIGHT FOREARM Performed at [facility], [address] [place], [place] [address]    Special Requests   Final    BOTTLES DRAWN AEROBIC AND ANAEROBIC Blood Culture adequate volume Performed at [facility], [address] [place], [place] [address]    Culture   Final    NO GROWTH 3 DAYS Performed at [facility], [address] [place], [place] [address]    Report Status PENDING  Incomplete  SARS Coronavirus 2 (CEPHEID- Performed in [facility] hospital lab), Hosp Order     Status: None   Collection Time: 09/29/18  3:44 PM   Specimen: Nasopharyngeal Swab  Result Value Ref Range Status   SARS Coronavirus 2 NEGATIVE NEGATIVE Final    Comment: (NOTE) If result is NEGATIVE SARS-CoV-2 target nucleic acids are NOT  DETECTED. The SARS-CoV-2 RNA is generally detectable in upper and lower  respiratory specimens during the acute phase of infection. The lowest  concentration of SARS-CoV-2 viral copies this assay can detect is 250  copies / mL. A negative result does not preclude SARS-CoV-2 infection  and should not be used as the sole basis for treatment or other  patient management decisions.  A negative result may occur with  improper specimen collection / handling, submission of specimen other  than nasopharyngeal swab, presence of viral mutation(s) within the  areas targeted by this assay, and inadequate number of viral copies  (<250 copies / mL). A negative result must be combined with clinical  observations, patient history, and epidemiological information. If result is POSITIVE SARS-CoV-2 target nucleic acids are DETECTED. The SARS-CoV-2 RNA is generally detectable in upper and lower  respiratory specimens dur ing the acute phase of infection.  Positive  results are indicative of active infection with SARS-CoV-2.  Clinical  correlation with patient history and other diagnostic information is  necessary to determine patient infection status.  Positive results do  not rule out bacterial infection or co-infection with other viruses. If result is PRESUMPTIVE POSTIVE SARS-CoV-2 nucleic acids MAY BE PRESENT.   A presumptive positive result was obtained on the submitted specimen  and confirmed on repeat testing.  While 2019 novel coronavirus  (SARS-CoV-2) nucleic acids may be present in the submitted sample  additional confirmatory testing may be necessary for epidemiological  and / or clinical management purposes  to differentiate between  SARS-CoV-2 and other Sarbecovirus currently known to infect humans.  If clinically indicated additional testing with an alternate test  methodology [phone] is advised. The SARS-CoV-2 RNA is generally  detectable in upper and lower respiratory sp ecimens during  the acute  phase of infection. The expected result is Negative. Fact Sheet for Patients:  BoilerBrush.com.cyhttps://www.fda.gov/media/136312/download Fact Sheet for Healthcare Providers: https://pope.com/https://www.fda.gov/media/136313/download This test is not yet approved or cleared by the [place] FDA and has been authorized for detection and/or diagnosis of SARS-CoV-2 by FDA under an Emergency Use Authorization (EUA).  This EUA will remain in effect (meaning this test can be used) for the duration of the COVID-19 declaration under Section 564(b)(1) of the Act, 21 U.S.C. section 360bbb-3(b)(1), unless the authorization is terminated or revoked sooner. Performed at [facility], [address] [place], [place] [address]   MRSA PCR Screening     Status: None   Collection Time: 09/30/18 12:22 AM   Specimen: Nasal Mucosa; Nasopharyngeal  Result Value Ref Range  Status   MRSA by PCR NEGATIVE NEGATIVE Final    Comment:        The GeneXpert MRSA Assay (FDA approved for NASAL specimens only), is one component of a comprehensive MRSA colonization surveillance program. It is not intended to diagnose MRSA infection nor to guide or monitor treatment for MRSA infections. Performed at [facility], [address] [place], [place] [address]      Radiology Studies: Dg Chest Port 1 View  Result Date: 10/03/2018 CLINICAL DATA:  Dyspnea. EXAM: PORTABLE CHEST 1 VIEW COMPARISON:  Radiograph October 02, 2018. FINDINGS: Stable cardiomediastinal silhouette. No pneumothorax or pleural effusion is noted. Stable bilateral lung opacities are noted, right greater than left, concerning for edema or possibly pneumonia. Bony thorax is unremarkable. IMPRESSION: Stable bilateral lung opacities, right greater than left, as described above. Electronically Signed   By: Lupita RaiderJames  Green Jr M.D.   On: 10/03/2018 07:53   Dg Chest Port 1 View  Result Date: 10/02/2018 CLINICAL DATA:  Edema. EXAM: PORTABLE CHEST 1  VIEW COMPARISON:  One-view chest x-ray 10/01/2018 FINDINGS: The heart size is normal. Diffuse interstitial and airspace disease is improved. There is improved aeration at both lung bases. IMPRESSION: 1. Improving aeration of both lungs with residual diffuse interstitial and airspace disease likely representing edema or infection. Electronically Signed   By: Marin Robertshristopher  Mattern M.D.   On: 10/02/2018 07:24    Scheduled Meds:  atorvastatin  80 mg Oral q1800   brimonidine  1 drop Both Eyes BID   Chlorhexidine Gluconate Cloth  6 each Topical Daily   finasteride  5 mg Oral Daily   furosemide  40 mg Intravenous Daily   heparin  5,000 Units Subcutaneous Q8H   insulin aspart  0-5 Units Subcutaneous QHS   insulin aspart  0-9 Units Subcutaneous TID WC   ipratropium  0.5 mg Nebulization TID   levalbuterol  0.63 mg Nebulization TID   levothyroxine  50 mcg Oral Q0600   LORazepam  1 mg Intravenous Once   mouth rinse  15 mL Mouth Rinse BID   metoprolol tartrate  5 mg Intravenous Q8H   OLANZapine zydis  2.5 mg Oral QHS   olopatadine  1 drop Both Eyes BID   pantoprazole  40 mg Oral Daily   polyvinyl alcohol  1 drop Both Eyes QID   tamsulosin  0.4 mg Oral Daily   Continuous Infusions:  ceFEPime (MAXIPIME) IV 2 g (10/03/18 1052)   diltiazem (CARDIZEM) infusion Stopped (10/01/18 2347)   metronidazole Stopped (10/03/18 0754)   potassium PHOSPHATE IVPB (in mmol) 30 mmol (10/03/18 0806)     LOS: 4 days    Time spent: 45 minutes spent in the coordination of care today.   Teddy Spikeyrone A Delany Steury, DO [facility] Pager [phone]  If 7PM-7AM, please contact night-coverage [email or web address] Password [email or web address] 10/03/2018, 10:54 AM

## 2018-10-03 NOTE — Consult Note (Addendum)
Consultation Note Date: 10/03/2018   Patient Name: [name]  DOB: 11-26-1927  MRN: [id number]  Age / Sex: 85 y.o., male  PCP: Scot Jun, FNP Referring Physician: Jonnie Finner, DO  Reason for Consultation: Establishing goals of care  HPI/Patient Profile: 85 y.o. male  with past medical history of dementia, CVA, COPD, recurrent UTIs admitted on 09/29/2018 with confusion, abdominal pain, workup reveals sepsis secondary to cholecystitis and likely aspiration pneumonia. He is having increasing oxygen requirements, now on NRB at 15LPM. He is DNR. Palliative medicine consulted for [place].   Clinical Assessment and Goals of Care: I met with patient's youngest son (there are seven children) who lives with patient.  He tells me patient's spouse died several months ago and patient has been declining since. He feels that patient "lost will to live" when spouse died.  Additionally, [name] states that patient was distressed when he was diagnosed with pneumonia and did not want to have to progress through his dementia to an advanced state.  Options for comfort care vs continued aggressive medical care were discussed.  Son first stated he would prefer to take patient home.  Discussed concern that patient may not survive transport home due to high oxygen requirements and inability to provide 15L oxygen at home. Discussed possibility of transitioning to comfort in hospital and if patient was able, then could transfer to home with Hospice support, residential Hospice, or patient may experience hospital death.  Patient's son repeatedly stated his Dad "would not want to lay here for days". We discussed the interventions that were being done that were prolonging patient's life- oxygen, antibiotics, IV fluids. When we discussed comfort care option- stopping IV fluids, IV antibiotics, using morphine for comfort and eventually  transitioning oxygen down- patient's son discussed his worries that when his Mom received morphine she died 30 minutes later. Gave him emotional support re: morphine used at EOL for symptom management, patient's die from their disease processes. He verbalized comfort and understanding.  Son stated he would like to discuss options with family members.   Primary Decision Maker NEXT OF KIN- patient's children    SUMMARY OF RECOMMENDATIONS  -Family is considering transition to full comfort care -PMT contact information was given to them -If they decide to transition to comfort care, I would recommend initiating a morphine drip for patient along with other comfort measures, would not recommend transfer from facility  Addendum: Met with patient's 2 sons and 2 daughters. They have decided they would prefer to transition to comfort. They very passionately would prefer to get patient home to die, even if he is only home for a day. They note that their Mom died in the same house, and they feel it is important for their Dad to die their also so they can be together.  I spoke with [name] and they are able to provide 15 L [place] oxygen in the home.  Therefore- plan:   1. Care manager referral for home with Hospice  2. D/C labs, fingersticks  3. They would like to continue antibiotics until discharge  4. Comfort medications:    Lorazepam and morphine prn- recommend trialing PO medications to ensure comfort needs can be met at home  Code Status/Advance Care Planning:  DNR  Psycho-social/Spiritual:   Desire for further Chaplaincy support:yes  Prognosis:    Unable to determine- likely hours to days if comfort care is chosen  Discharge Planning: To Be Determined- likely hospital death if comfort  Primary Diagnoses: Present on Admission: . Sepsis ([place]) . (Resolved) Diabetes mellitus type II, uncontrolled ([place]) . (Resolved) Hypothyroidism . HTN (hypertension) . Atrial fibrillation ([place]) .  Altered mental status   I have reviewed the medical record, interviewed the patient and family, and examined the patient. The following aspects are pertinent.  Past Medical History:  Diagnosis Date  . Atrial fibrillation ([place])   . COPD (chronic obstructive pulmonary disease) ([place])   . CVA (cerebral infarction)   . Diabetes mellitus without complication ([place])   . Essential hypertension, benign   . Hard of hearing    Social History   Socioeconomic History  . Marital status: Married    Spouse name: Not on file  . Number of children: Not on file  . Years of education: Not on file  . Highest education level: Not on file  Occupational History  . Not on file  Social Needs  . Financial resource strain: Not on file  . Food insecurity    Worry: Not on file    Inability: Not on file  . Transportation needs    Medical: Not on file    Non-medical: Not on file  Tobacco Use  . Smoking status: Former Smoker    Types: Cigarettes  . Smokeless tobacco: Former Network engineer and Sexual Activity  . Alcohol use: No  . Drug use: No  . Sexual activity: Not on file  Lifestyle  . Physical activity    Days per week: Not on file    Minutes per session: Not on file  . Stress: Not on file  Relationships  . Social Herbalist on phone: Not on file    Gets together: Not on file    Attends religious service: Not on file    Active member of club or organization: Not on file    Attends meetings of clubs or organizations: Not on file    Relationship status: Not on file  Other Topics Concern  . Not on file  Social History Narrative  . Not on file   Family History  Problem Relation Age of Onset  . CAD Son   . Skin cancer Brother    Scheduled Meds: . atorvastatin  80 mg Oral q1800  . brimonidine  1 drop Both Eyes BID  . Chlorhexidine Gluconate Cloth  6 each Topical Daily  . finasteride  5 mg Oral Daily  . heparin  5,000 Units Subcutaneous Q8H  . insulin aspart  0-5 Units  Subcutaneous QHS  . insulin aspart  0-9 Units Subcutaneous TID WC  . ipratropium  0.5 mg Nebulization TID  . levalbuterol  0.63 mg Nebulization TID  . levothyroxine  50 mcg Oral Q0600  . LORazepam  1 mg Intravenous Once  . mouth rinse  15 mL Mouth Rinse BID  . metoprolol tartrate  5 mg Intravenous Q8H  . OLANZapine zydis  2.5 mg Oral QHS  . olopatadine  1 drop Both Eyes BID  . pantoprazole  40 mg Oral Daily  . polyvinyl  alcohol  1 drop Both Eyes QID  . tamsulosin  0.4 mg Oral Daily   Continuous Infusions: . ceFEPime (MAXIPIME) IV Stopped (10/03/18 1205)  . diltiazem (CARDIZEM) infusion Stopped (10/01/18 2347)  . metronidazole Stopped (10/03/18 0754)  . potassium PHOSPHATE IVPB (in mmol) 30 mmol (10/03/18 0806)   PRN Meds:.acetaminophen **OR** acetaminophen, bisacodyl, hydrALAZINE, iohexol, levalbuterol, LORazepam, morphine injection, OLANZapine zydis, ondansetron **OR** ondansetron (ZOFRAN) IV, oxyCODONE, polyvinyl alcohol, senna-docusate Medications Prior to Admission:  Prior to Admission medications   Medication Sig Start Date End Date Taking? Authorizing Provider  amLODipine (NORVASC) 5 MG tablet Take 1 tablet (5 mg total) by mouth daily. 08/08/18  Yes Lassen, Arlo C, PA-C  atorvastatin (LIPITOR) 80 MG tablet Take 1 tablet (80 mg total) by mouth daily at 6 PM. 08/08/18  Yes Lassen, Arlo C, PA-C  brimonidine (ALPHAGAN) 0.15 % ophthalmic solution Place 1 drop into both eyes 2 (two) times daily. 08/08/18  Yes Lassen, Arlo C, PA-C  budesonide-formoterol (SYMBICORT) 80-4.5 MCG/ACT inhaler Inhale 2 puffs into the lungs 2 (two) times daily. 08/08/18  Yes Lassen, Arlo C, PA-C  Carboxymethylcellulose Sodium 0.25 % SOLN Place 2 drops into both eyes 4 (four) times daily as needed (for dry eyes). 08/08/18  Yes Lassen, Arlo C, PA-C  finasteride (PROSCAR) 5 MG tablet Take 1 tablet (5 mg total) by mouth daily. 08/08/18  Yes Lassen, Arlo C, PA-C  furosemide (LASIX) 20 MG tablet Take 1 tablet (20 mg total)  by mouth daily. 09/18/18  Yes Rama, Venetia Maxon, MD  Ipratropium-Albuterol (COMBIVENT RESPIMAT) 20-100 MCG/ACT AERS respimat Inhale 1 puff into the lungs every 6 (six) hours as needed for wheezing. 08/08/18  Yes Lassen, Arlo C, PA-C  levalbuterol (XOPENEX) 0.63 MG/3ML nebulizer solution Take 3 mLs (0.63 mg total) by nebulization every 6 (six) hours as needed for wheezing or shortness of breath. 08/08/18  Yes Oscar La, Arlo C, PA-C  levothyroxine (SYNTHROID) 25 MCG tablet Take 2 tablets (50 mcg total) by mouth daily before breakfast. 08/08/18  Yes Oscar La, Arlo C, PA-C  lisinopril (ZESTRIL) 40 MG tablet Take 0.5 tablets (20 mg total) by mouth daily. 08/08/18  Yes Oscar La, Arlo C, PA-C  metFORMIN (GLUCOPHAGE) 500 MG tablet Take 1 tablet (500 mg total) by mouth 2 (two) times daily with a meal. 08/08/18  Yes Lassen, Arlo C, PA-C  metoprolol tartrate (LOPRESSOR) 25 MG tablet Take 1 tablet (25 mg total) by mouth 2 (two) times daily. 08/08/18  Yes Lassen, Arlo C, PA-C  Multiple Vitamins-Minerals (MULTIVITAMIN WITH MINERALS) tablet Take 1 tablet by mouth daily. 08/08/18  Yes Lassen, Arlo C, PA-C  olopatadine (PATANOL) 0.1 % ophthalmic solution Place 1 drop into both eyes 2 (two) times daily. 08/08/18  Yes Lassen, Arlo C, PA-C  omeprazole (PRILOSEC) 20 MG capsule Take 1 capsule (20 mg total) by mouth daily. . 08/08/18  Yes Oscar La, Arlo C, PA-C  polyvinyl alcohol (LIQUIFILM TEARS) 1.4 % ophthalmic solution Place 1 drop into both eyes 4 (four) times daily. 08/08/18  Yes Lassen, Arlo C, PA-C  senna (SENOKOT) 8.6 MG TABS tablet Take 2 tablets (17.2 mg total) by mouth at bedtime. 08/08/18  Yes Lassen, Arlo C, PA-C  tamsulosin (FLOMAX) 0.4 MG CAPS capsule Take 1 capsule (0.4 mg total) by mouth daily. 08/08/18  Yes Lassen, Arlo C, PA-C  traZODone (DESYREL) 50 MG tablet Take 0.5-1 tablets (25-50 mg total) by mouth at bedtime as needed for sleep. 09/05/18  Yes Scot Jun, FNP  apixaban (ELIQUIS) 5 MG TABS tablet Take  1 tablet (5  mg total) by mouth 2 (two) times daily. Patient not taking: Reported on 09/29/2018 09/18/18   Rama, Venetia Maxon, MD  nitroGLYCERIN (NITROSTAT) 0.4 MG SL tablet Place 1 tablet (0.4 mg total) under the tongue every 5 (five) minutes as needed for chest pain. Dissolve one tablet under the tongue every 5 minutes as needed for chest pain. Repeat every 5 minutes if needed for a total of 3 tablets in 15 minutes. If no relief, call 911. Patient taking differently: Place 0.4 mg under the tongue every 5 (five) minutes x 3 doses as needed for chest pain (CALL 9-1-1, IF NO RELIEF).  08/08/18   Wille Celeste, PA-C  Skin Protectants, Misc. (MINERIN) CREA Apply 1 application topically See admin instructions. APPLY TO TRUNK AND LEGS TWICE A DAY     [provider]   No Known Allergies Review of Systems  Unable to perform ROS: Dementia    Physical Exam Vitals signs and nursing note reviewed.     Vital Signs: BP 132/69   Pulse (!) 137   Temp 97.9 F (36.6 C) (Oral)   Resp (!) 28   Ht 6' (1.829 m)   Wt 77.1 kg   SpO2 96%   BMI 23.06 kg/m  Pain Scale: (denied pain when asked) POSS *See Group Information*: 1-Acceptable,Awake and alert Pain Score: Asleep   SpO2: SpO2: 96 % O2 Device:SpO2: 96 % O2 Flow Rate: .O2 Flow Rate (L/min): 15 L/min  IO: Intake/output summary:   Intake/Output Summary (Last 24 hours) at 10/03/2018 1349 Last data filed at 10/03/2018 0626 Gross per 24 hour  Intake 369.52 ml  Output 750 ml  Net -380.48 ml    LBM:   Baseline Weight: Weight: 77.1 kg Most recent weight: Weight: 77.1 kg     Palliative Assessment/Data: PPS: 10%     Thank you for this consult. Palliative medicine will continue to follow and assist as needed.   Time In: 1230 Time Out: 1400 Time Total: 90 minutes Prolonged services billed: yes Greater than 50%  of this time was spent counseling and coordinating care related to the above assessment and plan.  Signed by: Mariana Kaufman, AGNP-C  Palliative Medicine    Please contact Palliative Medicine Team phone at [phone] for questions and concerns.  For individual provider: See Shea Evans

## 2018-10-03 NOTE — Progress Notes (Signed)
  Speech Language Pathology Treatment: Dysphagia  Patient Details Name: [name] MRN: [id number] DOB: 23-Oct-1927 Today's Date: 10/03/2018 Time: 5465-6812 SLP Time Calculation (min) (ACUTE ONLY): 20 min  Assessment / Plan / Recommendation Clinical Impression  Patient was restrained during session, but was calm and cooperative. He opened his eyes slightly when verbally cued to do so. He stated he wanted water. Trials of ice chips, sips of water by cup and straw were tolerated with only one immediate cough response to a sequencial swallow by straw. MD feels he is silently aspirating as he continues to require more O2 even with antibiotic treatment, has a history of dementia and strokes. Pt is currently NPO, but has been receiving medication by mouth. MD wants to hold instrumental testing until he speaks with family. Not recommending PEG placement.   HPI HPI: [name] with PMH: presumed dementia, afib, COPD, CVA, DM-2, HTN, hearing impairment, hypothyroidism, CAD and glaucoma who was admitted on 7/5 secondary to c/o abdominal pain and low bak pain over 2 days prior. Patient was recent admission from 6/21-6/24 with PNA and CHF.      SLP Plan  Continue with current plan of care       Recommendations  Diet recommendations: NPO Medication Administration: Crushed with puree                Plan: Continue with current plan of care       [place], MA, CCC-SLP 10/03/2018 9:23 AM

## 2018-10-03 NOTE — Progress Notes (Signed)
Chaplain introduced spiritual care with family at bedside.  Family expressed appreciation of chaplain presence.  Brief support provided, as family was hopeful to get bed set up at home for pt to discharge home with hospice.  Family understands how to contact spiritual care 24 hours.

## 2018-10-03 NOTE — Consult Note (Addendum)
NAME:  [name], MRN:  [id number], DOB:  02/13/1928, LOS: 4 ADMISSION DATE:  09/29/2018, CONSULTATION DATE:  10/03/2018 REFERRING MD:  Margie Egeyrone Kyle, DO, CHIEF COMPLAINT:  Acute hypoxic respiratory failure   Brief History   85 year old male with presumed h/o dementia,  hx CVA (2017 ), and COPD; presented from home with progressive confusion,abdominal pain and lower back pain. Admitted w/ working dx of acalculous cholecystitis on 7/5. Placed NPO, abx started. On 7/6 developed worsening respiratory failure in the context of progressive right sided airspace disease. PCCM consulted on 7/9 for worsening respiratory failure.   History of present illness    85 year old male w/ hx as mentioned below including: presumed h/o dementia,  COPD, hx CVA (2017 ), and recurrent UTIs presented from home with progressive confusion,abdominal pain and lower back pain. Admitted w/ working dx of acalculous cholecystitis on 7/5. Place NPO, abx started. Surgery consulted recommended HIDA scan but has been unable to do this due to agitation.  On 7/6 developed worsening respiratory failure in the context of progressive right sided airspace disease. Initially was on room air. Oxygen requirements increased to require 15 liters by am 7/9. CXR w/ diffuse right sided airspace disease. PCCM consulted on 7/9 for worsening respiratory failure.   Past Medical History  Hypertension, Atrial fibrillation, Altered mental status, Sepsis, COPD exacerbation Benign prostatic hyperplasia (BPH), Unstable angina, Coronary artery disease Hx of CVA (2017), Recurrent UTIs - VRE, Acute CHF , Dysphasia   Significant Hospital Events   09/29/2018 >> Admit   Consults:  General Surgery Psychiatry  PCCM   Procedures:    Significant Diagnostic Tests:  7/5 CT abd: Distended gallbladder today, and motion artifact rendering determination of pericholecystic inflammation difficult. Recommend follow-up Right Upper Quadrant Ultrasound. Otherwise no  acute or inflammatory process identified in the abdomen or pelvis.  7/5 US ABD Limited RUQ: No gallstones or gallbladder wall thickening. Minimal pericholecystic fluid is noted. Ultrasound technologist could not cyst sonographic Murphy sign because the patient was unresponsive.  7/8 Echocardiogram:  The left ventricle has normal systolic function, with an ejection fraction of 55-60%. Left ventricular diastolic function could not be evaluated secondary to atrial fibrillation. Indeterminate filling pressures No evidence of left ventricular regional wall motion abnormalities. The right ventricle has mildly reduced systolic function. Right ventricular systolic pressure is moderately elevated. Left atrial size was mildly dilated. Moderate sclerosis of the aortic valve. The inferior vena cava was dilated in size with <50% respiratory variability. The interatrial septum appears to be lipomatous.  Micro Data:  7/5 SARS-CoV-2 >> NEG 7/5 Blood Cx >>  7/6 MRSA by PCR >> NEG  Antimicrobials:  Metronidazole 7/6 >>  Cefepime 7/7 >>   Interim history/subjective:  More oxygen requirements   Objective   Blood pressure (Abnormal) 151/95, pulse (Abnormal) 125, temperature 97.9 F (36.6 C), temperature source Oral, resp. rate (Abnormal) 31, height 6' (1.829 m), weight 77.1 kg, SpO2 94 %.        Intake/Output Summary (Last 24 hours) at 10/03/2018 [id number] Last data filed at 10/03/2018 [id number] Gross per 24 hour  Intake 369.52 ml  Output 750 ml  Net -380.48 ml   Filed Weights   09/29/18 1935  Weight: 77.1 kg    Examination: General: 85 year old frail male, resting in bed, no acute distress  HENT: [place]/AT Lungs: Decreased at bases, slight rhonchi on right Cardiovascular: Irregular rate, atrial fibrillation in 100-120s, no murmurs, rubs, or gallops noted Abdomen: Hypoactive bowel sounds, no  masses noted, no organomegaly Extremities: No edema, clubbing, warm dry Neuro: Alert to name, intermittently follows  commands GU: adequate urine output,   Resolved Hospital Problem list    Assessment & Plan:   Acute Hypoxic Respiratory Failure due to right sided pulm infiltrates and presumed Aspiration Pneumonia H/o COPD Low suspicion for PE/DVT Plan:  - Trial heated HFHH, sat goal > 95% - Continue supplemental O2  - Continue sch Duo-Nebs TID - Agree with empirical abx, deescalate pending Cx data - Aspiration precautions, keep NPO for high O2 requirements  - Concerning patient is a DNR and increasing O2 requirements, recommend palliative care consultation  Severe Sepsis - Acalculous cholecystitis acutely c/b aspiration PNA - Febrile, tachycardia, leukocytosis Plan: - Continue cefepime and flagyl - Trend WBC  Acute Metabolic Encephalopathy superimposed on presumptive dx of dementia and prior CVA - Agitation contributing to resp status - Related to acute illness Plan:  - PRN Ativan, prolonged QTc so can't use haldol  - Psych consulted - cont statin - frequent re-orientation - day/night orientation - consider safety sitter to re-orient   AFIB w/ RVR Plan: - Continue sch metoprolol for rate control - Home meds: eliquis and metoprolol - not taking eliquis d/t cost   H/o HFpEF, HTN - ECHO 7/5  Plan: - cont tele   Fluid and electrolyte imbalance: hypokalemia, hypophosphatemia, NAG metabolic acidosis ? Diuresis and bicarb loss?  Plan Hold lasix today Replace lytes Recheck am   Elevated Troponin - likely related to ischemia, secondary to sepsis Plan - Supportive care, treat underlying sepsis pathology    Hypothyroidism  Plan: - Continue synthroid  Diabetes Type 2: Plan: - Continue SSI - CBG q4h  Glaucoma Plan:  - Continue home eyedrops   Best practice:  Diet: NPO Pain/Anxiety/Delirium protocol (if indicated): N/A VAP protocol (if indicated): N/A DVT prophylaxis: Heparin and SCDs GI prophylaxis: Pantoprazole Glucose control: SSI Mobility: increase as tolerated    Code Status: DNR Family Communication: Per primary team Disposition: ICU  Labs   CBC: Recent Labs  Lab 09/29/18 1543 09/30/18 0211 09/30/18 0742 10/01/18 0209 10/02/18 0237 10/03/18 0218  WBC 22.9* 29.1* 25.6* 21.0* 13.6* 10.3  NEUTROABS 21.0*  --   --   --   --  8.8*  HGB 13.0 11.1* 11.1* 9.8* 10.2* 10.9*  HCT 41.8 35.1* 34.7* 31.3* 33.1* 35.9*  MCV 89.1 89.5 91.1 92.3 92.7 92.8  PLT 184 163 163 129* 128* 141*    Basic Metabolic Panel: Recent Labs  Lab 09/29/18 1543 09/30/18 0211 09/30/18 0618 09/30/18 0742 10/01/18 0209 10/01/18 0810 10/02/18 0237 10/03/18 0218  NA 136  --  138  --  140  --  140 141  K 4.0  --  4.3  --  4.0  --  3.5 3.4*  CL 101  --  108  --  111  --  111 109  CO2 22  --  20*  --  21*  --  19* 18*  GLUCOSE 139*  --  100*  --  124*  --  113* 120*  BUN 14  --  16  --  22  --  28* 33*  CREATININE 0.83 0.92 0.92  --  1.07  --  1.17 0.99  CALCIUM 9.2  --  8.4*  --  7.7*  --  7.7* 7.8*  MG  --   --   --  1.3* 1.4*  --  2.3 2.2  PHOS  --   --   --   --   --  2.5  --  1.5*   GFR: Estimated Creatinine Clearance: 55.2 mL/min (by C-G formula based on SCr of 0.99 mg/dL). Recent Labs  Lab 09/29/18 1700 09/30/18 0211 09/30/18 0618 09/30/18 [id number] 10/01/18 0209 10/01/18 0810 10/02/18 0237 10/03/18 0218  PROCALCITON  --  2.36  --   --   --   --   --   --   WBC  --  29.1*  --  25.6* 21.0*  --  13.6* 10.3  LATICACIDVEN 1.8 1.7 2.5*  --   --  1.4  --   --     Liver Function Tests: Recent Labs  Lab 09/29/18 1543 09/30/18 0618 10/02/18 0237 10/03/18 0218  AST 23 31 39  --   ALT 21 19 24   --   ALKPHOS 60 41 43  --   BILITOT 0.8 0.8 1.4*  --   PROT 7.0 5.6* 5.5*  --   ALBUMIN 4.2 3.3* 2.8* 2.9*   Recent Labs  Lab 09/29/18 1543  LIPASE 32   No results for input(s): AMMONIA in the last 168 hours.  ABG    Component Value Date/Time   PHART 7.412 10/02/2018 1418   PCO2ART 32.7 10/02/2018 1418   PO2ART 49.2 (L) 10/02/2018 1418   HCO3 20.4  10/02/2018 1418   TCO2 29 09/15/2018 1534   ACIDBASEDEF 2.9 (H) 10/02/2018 1418   O2SAT 83.5 10/02/2018 1418     Coagulation Profile: Recent Labs  Lab 09/30/18 0211  INR 1.3*    Cardiac Enzymes: No results for input(s): CKTOTAL, CKMB, CKMBINDEX, TROPONINI in the last 168 hours.  HbA1C: Hgb A1c MFr Bld  Date/Time Value Ref Range Status  09/30/2018 02:11 AM 7.2 (H) 4.8 - 5.6 % Final    Comment:    (NOTE) Pre diabetes:          5.7%-6.4% Diabetes:              >6.4% Glycemic control for   <7.0% adults with diabetes   07/10/2018 12:04 PM 7.9 (H) 4.8 - 5.6 % Final    Comment:    (NOTE) Pre diabetes:          5.7%-6.4% Diabetes:              >6.4% Glycemic control for   <7.0% adults with diabetes     CBG: Recent Labs  Lab 10/02/18 1151 10/02/18 1554 10/02/18 2136 10/02/18 2340 10/03/18 0829  GLUCAP 132* 86 99 103* 134*    Review of Systems:   Not able d/t mental status   Past Medical History  He,  has a past medical history of Atrial fibrillation (HCC), COPD (chronic obstructive pulmonary disease) (HCC), CVA (cerebral infarction), Diabetes mellitus without complication (HCC), Essential hypertension, benign, and Hard of hearing.   Surgical History    Past Surgical History:  Procedure Laterality Date   HERNIA REPAIR     LEFT HEART CATHETERIZATION WITH CORONARY ANGIOGRAM N/A 03/28/2013   Procedure: LEFT HEART CATHETERIZATION WITH CORONARY ANGIOGRAM;  Surgeon: Keldan Eplin M Swazil[place]Jordan, MD;  [facility];  Service: Cardiovascular;  Laterality: N/A;     Social History   reports that he has quit smoking. His smoking use included cigarettes. He has quit using smokeless tobacco. He reports that he does not drink alcohol or use drugs.   Family History   His family history includes CAD in his son; Skin cancer in his brother.   Allergies No Known Allergies   Home Medications  Prior to Admission medications  Medication Sig Start Date End Date Taking? Authorizing  Provider  amLODipine (NORVASC) 5 MG tablet Take 1 tablet (5 mg total) by mouth daily. 08/08/18  Yes Lassen, Arlo C, PA-C  atorvastatin (LIPITOR) 80 MG tablet Take 1 tablet (80 mg total) by mouth daily at 6 PM. 08/08/18  Yes Lassen, Arlo C, PA-C  brimonidine (ALPHAGAN) 0.15 % ophthalmic solution Place 1 drop into both eyes 2 (two) times daily. 08/08/18  Yes Lassen, Arlo C, PA-C  budesonide-formoterol (SYMBICORT) 80-4.5 MCG/ACT inhaler Inhale 2 puffs into the lungs 2 (two) times daily. 08/08/18  Yes Lassen, Arlo C, PA-C  Carboxymethylcellulose Sodium 0.25 % SOLN Place 2 drops into both eyes 4 (four) times daily as needed (for dry eyes). 08/08/18  Yes Lassen, Arlo C, PA-C  finasteride (PROSCAR) 5 MG tablet Take 1 tablet (5 mg total) by mouth daily. 08/08/18  Yes Lassen, Arlo C, PA-C  furosemide (LASIX) 20 MG tablet Take 1 tablet (20 mg total) by mouth daily. 09/18/18  Yes Rama, Maryruth Bunhristina P, MD  Ipratropium-Albuterol (COMBIVENT RESPIMAT) 20-100 MCG/ACT AERS respimat Inhale 1 puff into the lungs every 6 (six) hours as needed for wheezing. 08/08/18  Yes Lassen, Arlo C, PA-C  levalbuterol (XOPENEX) 0.63 MG/3ML nebulizer solution Take 3 mLs (0.63 mg total) by nebulization every 6 (six) hours as needed for wheezing or shortness of breath. 08/08/18  Yes Trudie ReedLassen, Arlo C, PA-C  levothyroxine (SYNTHROID) 25 MCG tablet Take 2 tablets (50 mcg total) by mouth daily before breakfast. 08/08/18  Yes Trudie ReedLassen, Arlo C, PA-C  lisinopril (ZESTRIL) 40 MG tablet Take 0.5 tablets (20 mg total) by mouth daily. 08/08/18  Yes Trudie ReedLassen, Arlo C, PA-C  metFORMIN (GLUCOPHAGE) 500 MG tablet Take 1 tablet (500 mg total) by mouth 2 (two) times daily with a meal. 08/08/18  Yes Lassen, Arlo C, PA-C  metoprolol tartrate (LOPRESSOR) 25 MG tablet Take 1 tablet (25 mg total) by mouth 2 (two) times daily. 08/08/18  Yes Lassen, Arlo C, PA-C  Multiple Vitamins-Minerals (MULTIVITAMIN WITH MINERALS) tablet Take 1 tablet by mouth daily. 08/08/18  Yes Lassen, Arlo C,  PA-C  olopatadine (PATANOL) 0.1 % ophthalmic solution Place 1 drop into both eyes 2 (two) times daily. 08/08/18  Yes Lassen, Arlo C, PA-C  omeprazole (PRILOSEC) 20 MG capsule Take 1 capsule (20 mg total) by mouth daily. . 08/08/18  Yes Trudie ReedLassen, Arlo C, PA-C  polyvinyl alcohol (LIQUIFILM TEARS) 1.4 % ophthalmic solution Place 1 drop into both eyes 4 (four) times daily. 08/08/18  Yes Lassen, Arlo C, PA-C  senna (SENOKOT) 8.6 MG TABS tablet Take 2 tablets (17.2 mg total) by mouth at bedtime. 08/08/18  Yes Lassen, Arlo C, PA-C  tamsulosin (FLOMAX) 0.4 MG CAPS capsule Take 1 capsule (0.4 mg total) by mouth daily. 08/08/18  Yes Lassen, Arlo C, PA-C  traZODone (DESYREL) 50 MG tablet Take 0.5-1 tablets (25-50 mg total) by mouth at bedtime as needed for sleep. 09/05/18  Yes Bing NeighborsHarris, Kimberly S, FNP  apixaban (ELIQUIS) 5 MG TABS tablet Take 1 tablet (5 mg total) by mouth 2 (two) times daily. Patient not taking: Reported on 09/29/2018 09/18/18   Rama, Maryruth Bunhristina P, MD  nitroGLYCERIN (NITROSTAT) 0.4 MG SL tablet Place 1 tablet (0.4 mg total) under the tongue every 5 (five) minutes as needed for chest pain. Dissolve one tablet under the tongue every 5 minutes as needed for chest pain. Repeat every 5 minutes if needed for a total of 3 tablets in 15 minutes. If no relief, call 911. Patient taking  differently: Place 0.4 mg under the tongue every 5 (five) minutes x 3 doses as needed for chest pain (CALL 9-1-1, IF NO RELIEF).  08/08/18   Wille Celeste, PA-C  Skin Protectants, Misc. (MINERIN) CREA Apply 1 application topically See admin instructions. APPLY TO TRUNK AND LEGS TWICE A DAY     [provider]     Critical care time: 10 min     Erick Colace ACNP-BC [place] Pager # [phone] OR # [phone] if no answer

## 2018-10-03 NOTE — Telephone Encounter (Signed)
Patient currently admitted

## 2018-10-03 NOTE — Progress Notes (Signed)
Notified on call, Baltazar Najjar in regards to patient's elevated HR sustaining greater than 130, as high as 164, for a lengthy amount of time. Apresoline IV had been given for SBP greater than 150 and it was effective. Awaiting call back for advisement.

## 2018-10-04 ENCOUNTER — Inpatient Hospital Stay (HOSPITAL_COMMUNITY): Payer: Medicare Other

## 2018-10-04 LAB — CULTURE, BLOOD (ROUTINE X 2)
Culture: NO GROWTH
Culture: NO GROWTH
Special Requests: ADEQUATE

## 2018-10-04 LAB — GLUCOSE, CAPILLARY
Glucose-Capillary: 114 mg/dL — ABNORMAL HIGH (ref 70–99)
Glucose-Capillary: 133 mg/dL — ABNORMAL HIGH (ref 70–99)
Glucose-Capillary: 142 mg/dL — ABNORMAL HIGH (ref 70–99)

## 2018-10-04 MED ORDER — MORPHINE SULFATE (CONCENTRATE) 10 MG/0.5ML PO SOLN: 5.0000 mg | ORAL | 0 refills | Status: AC | PRN

## 2018-10-04 MED ORDER — ACETAMINOPHEN 650 MG RE SUPP: 650.0000 mg | Freq: Four times a day (QID) | RECTAL | 0 refills | Status: AC | PRN

## 2018-10-04 MED ORDER — OLANZAPINE 5 MG PO TBDP: 2.5000 mg | ORAL_TABLET | Freq: Every day | ORAL | 0 refills | Status: AC

## 2018-10-04 NOTE — Progress Notes (Signed)
Manufacturing engineer [facility]) Hospice  Received referral from PMT for hospice services at home.  Family would like to d/c today ASAP so pt can pass at the house that he built himself.  Pt is eligible for hospice services.  DME discussed, ordered O2 15lpm, bed and bedside table STAT.  Family will call unit once equipment delivered so transport can be arranged via [place].  Please leave indwelling foley in place.  You may remove any IVs.  Please send any comfort medications script with pt that he may need until [facility] services can be fully started and pt does not experience any unnecessary discomfort.  Please fax d/c summary to:  [phone]  Thank you, Venia Carbon RN, BSN, [place] [facility] Liaison  [phone]

## 2018-10-04 NOTE — Progress Notes (Signed)
PCCM Interval Note  Patient scheduled for discharge home for hospice today. Pulmonary consult team will sign off. Thank you for involving [place] in the care of your patient.  Rodman Pickle, M.D. [facility] Pulmonary/Critical Care Medicine

## 2018-10-04 NOTE — TOC Transition Note (Signed)
Transition of Care [facility]) - CM/SW Discharge Note   Patient Details  Name: [name] MRN: [id number] Date of Birth: 19-Jul-1927  [facility] CM/SW Contact:  Lynnell Catalan, RN Phone Number: 10/04/2018, 10:25 AM   Clinical Narrative:    [facility] home hospice set up by PMT. Gold DNR form placed on chart for signature. Pt to dc to home via [facility] when family ready today. Marney Doctor RN,BSN [phone]

## 2018-10-04 NOTE — Progress Notes (Signed)
[facility] called for transport home. Going to [place].   Marney Doctor RN,BSN [phone]

## 2018-10-04 NOTE — Progress Notes (Signed)
Palliative Medicine RN Note: Rec'd call from pt's daughter Lorre Nick. Discussed choice. They would like to use [facility] at home; contact is Christy Sartorius, or [place].   PMT RN reached out to [facility] rep Venia Carbon to notify of referral; she will call them to discuss equipment deliver timing.  Mr Ramires will need scripts for comfort meds, as well as a signed gold DNR form.  Marjie Skiff Delos Klich, RN, BSN, [facility] Palliative Medicine Team 10/04/2018 9:42 AM Office [phone]

## 2018-10-04 NOTE — Progress Notes (Signed)
Speech Language Pathology Discharge Patient Details Name: [name] MRN: [id number] DOB: 07-25-1927 Today's Date: 10/04/2018 Time:  -     Patient discharged from SLP services secondary to patient is discharging home with hospice.   Please see latest therapy progress note for current level of functioning and progress toward goals.      [place], MA, CCC-SLP 10/04/2018 10:19 AM  10/04/2018, 10:18 AM

## 2018-10-04 NOTE — Discharge Summary (Signed)
. Physician Discharge Summary  [name] ZOX:[id number] DOB: 29-Jan-1928 DOA: 09/29/2018  PCP: Bing Neighbors, FNP  Admit date: 09/29/2018 Discharge date: 10/04/2018  Admitted From: Home Disposition:  Discharge to home with hospice  Recommendations for Outpatient Follow-up:  1. Follow up with hospice.  Discharge Condition: Guarded  CODE STATUS: DNR   Brief/Interim Summary: 85 year old with past medical history significant for presumed dementia, A. fib, COPD, CVA, type 2 diabetes, hypertension, hearing impairment, hypothyroidism, coronary artery disease and glaucoma resents from home with progressive confusion. Patient is unable to participate in HPI due to altered mental status. Per family patient has been complaining of abdominal pain and low back pain over the last couple of days.  Evaluation in the ED temperature 103, heart rate 115, respiration rate 34, blood pressure 115/44 oxygen sat 92 on room air. White blood cell 22 hemoglobin 13. Chest x-ray no acute cardiopulmonary disease [name] test negative. CT abdomen and pelvis notable for possible distended gallbladder. Right upper quadrant ultrasound noted left lobe liver cyst no gallstones and minimal pericholecystic fluid noted.  Patient was admitted with sepsis of unclear etiology, acute metabolic and toxic encephalopathy. Troponins has been increasing. Discussed with cardiology on this level of troponin are likely related to sepsis.  General surgery was consulted and they are recommending HIDA scan, if positive patient will require percutaneous cholecystostomy.  Discharge Diagnoses:  Principal Problem:   Altered mental status Active Problems:   HTN (hypertension)   Atrial fibrillation (HCC)   Sepsis (HCC)   Hypoxemia   Goals of care, counseling/discussion   Advanced care planning/counseling discussion   Palliative care by specialist  Severe Sepsis - PNA vs acalculous cholecystitis.  - Patient  presented with fever, tachycardia, leukocytosis. - chest x ray with asymmetric infiltrates  - Bld C: NTD - CT abdomen; possible distended gallbladder.  - [place]; mild pericholecystic fluid.  - HIDAscan pending - cefepime, flagyl, vanc; can d/c vanc today - CT lumbar spine ordered. Patient has not been able to cooperate for study.  - previous physician discussed with Dr Cliffton Asters, will hold getting HIDA scan until respiratory status is more stable.     - concern that he is aspirating; spoke with PCCM, they feel the same; spoke with son about his current status; he would like to speak with palliative care about hopsice and options; I have consulted them     - family has elected outpt hospice; arrangements have been made; he will discharge to home.   Acute Metabolic Encephalopathy  - Related to acute illness - PRN Ativan; prolonged QT, watch haldol, etc - Liver function test normal.     - consulted psyc, appreciate assistance, low dose zyprexa started     - see above  Acute Hypoxic respiratory failure  - initial CXR with asymmetric edema vs infection; repeat CXR 7/8 shows improving aeration  - BNP is 847; see below - IV lasix.  - has required NRB after periods of agitation, will speak with PCCM - pO2 is low on ABG, he's not a candidate for escalation of O2 support; have discussed w/ son, would like to speak with palliative care about hospice options     - see above  HFpEF - Echo 07/10/18 shows left diastolic function c/w impaired relaxation - home meds include lisinopril, metoprolol, lasix - BNP 847 (724 last month) - repeat echo, continue lasix     - echo results noted     - see above  A fib RVR  -  metoprolol IV  - now off cardizem gtt. - home meds eliquis, metoprolol; not taking eliquis d/t cost     - see above   Elevation troponin  - Likely demand to ischemia, secondary to  sepsis. - prior physician discussed with cardiology: supportive care, treat underlying sepsis physiology.     - see above  History of COPD: - Nebulizer as needed. - on O2 at night at home     - see above  Hypertension - he has been hypotensive, hold amlodipine and lisinopril.     - BP is all over the place; monitor for now     - see above  History of CVA - continue with atorvastatin     - see above  Hypothyroidism - continue with Synthroid     - see above  Glaucoma  - continue with home eyedrops.     - see above  Diabetes type 2: - on metformin at home. - continue SSI     - see above  Hypomagnesemia - replete, monitor     - see above  Family has elected to bring the patient home with hospice. Arrangements have been made. He will discharge today.   Discharge Instructions   Allergies as of 10/04/2018   No Known Allergies     Medication List    STOP taking these medications   amLODipine 5 MG tablet Commonly known as: NORVASC   apixaban 5 MG Tabs tablet Commonly known as: ELIQUIS   atorvastatin 80 MG tablet Commonly known as: LIPITOR   brimonidine 0.15 % ophthalmic solution Commonly known as: ALPHAGAN   budesonide-formoterol 80-4.5 MCG/ACT inhaler Commonly known as: SYMBICORT   Carboxymethylcellulose Sodium 0.25 % Soln   finasteride 5 MG tablet Commonly known as: PROSCAR   furosemide 20 MG tablet Commonly known as: LASIX   Ipratropium-Albuterol 20-100 MCG/ACT Aers respimat Commonly known as: Combivent Respimat   levalbuterol 0.63 MG/3ML nebulizer solution Commonly known as: XOPENEX   levothyroxine 25 MCG tablet Commonly known as: SYNTHROID   lisinopril 40 MG tablet Commonly known as: ZESTRIL   metFORMIN 500 MG tablet Commonly known as: GLUCOPHAGE   metoprolol tartrate 25 MG tablet Commonly known as: LOPRESSOR   Minerin Creme Crea   multivitamin with minerals tablet   nitroGLYCERIN  0.4 MG SL tablet Commonly known as: NITROSTAT   olopatadine 0.1 % ophthalmic solution Commonly known as: PATANOL   omeprazole 20 MG capsule Commonly known as: PRILOSEC   polyvinyl alcohol 1.4 % ophthalmic solution Commonly known as: LIQUIFILM TEARS   senna 8.6 MG Tabs tablet Commonly known as: SENOKOT   tamsulosin 0.4 MG Caps capsule Commonly known as: FLOMAX   traZODone 50 MG tablet Commonly known as: DESYREL     TAKE these medications   acetaminophen 650 MG suppository Commonly known as: TYLENOL Place 1 suppository (650 mg total) rectally every 6 (six) hours as needed for mild pain (or Fever >/= 101).   morphine CONCENTRATE 10 MG/0.5ML Soln concentrated solution Place 0.25 mLs (5 mg total) under the tongue every 2 (two) hours as needed for up to 4 days for severe pain.   OLANZapine zydis 5 MG disintegrating tablet Commonly known as: ZYPREXA Take 0.5 tablets (2.5 mg total) by mouth at bedtime for 6 days.      Follow-up Information    [facility] up.   Why: For home hospice services Contact information: [address] [place] [address] [phone]         No Known  Allergies  Consultations:  PCCM  Palliative Care  Psychiatry   Procedures/Studies: Dg Chest 2 View  Result Date: 09/29/2018 CLINICAL DATA:  Productive cough, shortness of breath. EXAM: CHEST - 2 VIEW COMPARISON:  Radiograph of September 17, 2018. FINDINGS: The heart size and mediastinal contours are within normal limits. Both lungs are clear. No pneumothorax or pleural effusion is noted. The visualized skeletal structures are unremarkable. IMPRESSION: No active cardiopulmonary disease. Electronically Signed   By: Lupita Raider M.D.   On: 09/29/2018 15:04   Ct Head Wo Contrast  Result Date: 09/15/2018 CLINICAL DATA:  Slurred speech, headache and generalized weakness. History of stroke. EXAM: CT HEAD WITHOUT CONTRAST TECHNIQUE: Contiguous axial images were  obtained from the base of the skull through the vertex without intravenous contrast. COMPARISON:  07/14/2018 FINDINGS: Brain: No evidence of acute infarction, hemorrhage, or hydrocephalus. Stable large area of encephalomalacia from previous left MCA territory infarct. Lacunar infarct in the left pons is also stable. Brain parenchymal volume loss and microangiopathy. Vascular: Calcific atherosclerotic disease of the intra cavernous carotid arteries. Skull: Normal. Negative for fracture or focal lesion. Sinuses/Orbits: Polypoid mucosal thickening of the ethmoid sinuses. The remainder of the paranasal sinuses and mastoid air cells are well aerated. Other: None. IMPRESSION: 1. No acute intracranial abnormality. 2. Stable large area of encephalomalacia from previous left MCA territory infarct. 3. Stable lacunar infarct in the left pons. 4. Atrophy, chronic microvascular disease. Electronically Signed   By: Ted Mcalpine M.D.   On: 09/15/2018 16:03   Ct Abdomen Pelvis W Contrast  Result Date: 09/29/2018 CLINICAL DATA:  85 year old male with fever of 103, altered mental status. Abdominal pain. EXAM: CT ABDOMEN AND PELVIS WITH CONTRAST TECHNIQUE: Multidetector CT imaging of the abdomen and pelvis was performed using the standard protocol following bolus administration of intravenous contrast. CONTRAST:  OMNIPAQUE IOHEXOL 300 MG/ML  SOLN COMPARISON:  CT Abdomen and Pelvis 10/28/2014. chest radiographs earlier today. FINDINGS: Lower chest: Respiratory motion. No pericardial or pleural effusion. No lung base consolidation. Chronic mild cardiomegaly and small to moderate hiatal hernia. Hepatobiliary: Motion artifact, but the liver and bile ducts appear stable since 2016. There is a small chronic left hepatic lobe cyst. The gallbladder is more distended today, but there is no definite pericholecystic inflammation when allowing for motion. Right upper quadrant ultrasound would be valuable to confirm. Pancreas:  Stable atrophy. Spleen: Negative. Adrenals/Urinary Tract: Normal adrenal glands. Symmetric renal enhancement and contrast excretion with no hydronephrosis. Decompressed and normal proximal ureters. Unremarkable urinary bladder. Stomach/Bowel: Mild chronic sigmoid diverticulosis. Mild retained stool in the distal colon. Decompressed descending colon and splenic flexure. Mild retained stool in the transverse and ascending colon. Negative cecum and appendix (series 2, image 52). Motion artifact, but no convincing large bowel inflammation. Negative terminal ileum. No dilated small bowel. Chronic small bowel containing left inguinal hernia appears stable since 2016 and non incarcerated. Chronic hiatal hernia. Negative intra-abdominal stomach. No free air or free fluid. Vascular/Lymphatic: Aortoiliac calcified atherosclerosis. Ectatic to borderline aneurysmal infrarenal abdominal aorta (30 millimeters diameter) has increased by 1 millimeter since the prior CT. Major arterial structures appear patent. Portal venous system appears patent. No lymphadenopathy. Reproductive: Chronic left inguinal hernia containing a small bowel loop (series 2, image 72) appears similar to that in 2016, with no active inflammation identified. Other: No pelvic free fluid. Musculoskeletal: Motion artifact. Osteopenia. No acute osseous abnormality identified. IMPRESSION: 1. Distended gallbladder today, and motion artifact rendering determination of pericholecystic inflammation difficult. Recommend follow-up Right Upper  Quadrant Ultrasound. 2. Otherwise no acute or inflammatory process identified in the abdomen or pelvis. 3. Chronic small bowel containing left inguinal hernia appears stable since 2016 and non incarcerated. Normal appendix. Negative visible lung bases. 4. Aortic Atherosclerosis (ICD10-I70.0). Ectatic to mildly aneurysmal infrarenal aorta. Recommend followup by Ultrasound in 3 years. This recommendation follows ACR consensus  guidelines: White Paper of the ACR Incidental Findings Committee II on Vascular Findings. J Am Coll Radiol 2013; 10:789-794. Aortic aneurysm NOS (ICD10-I71.9) Electronically Signed   By: Genevie Ann M.D.   On: 09/29/2018 18:01   Dg Chest Port 1 View  Result Date: 10/04/2018 CLINICAL DATA:  Respiratory failure. EXAM: PORTABLE CHEST 1 VIEW COMPARISON:  10/03/2018.  07/14/2018. FINDINGS: Heart size stable. Diffuse bilateral pulmonary interstitial infiltrates, right side greater than left again noted. No interim change. No pleural effusion or pneumothorax. IMPRESSION: Diffuse bilateral pulmonary interstitial infiltrates, right side greater than left again noted. No interim change. Electronically Signed   By: Marcello Moores  Register   On: 10/04/2018 06:50   Dg Chest Port 1 View  Result Date: 10/03/2018 CLINICAL DATA:  Dyspnea. EXAM: PORTABLE CHEST 1 VIEW COMPARISON:  Radiograph October 02, 2018. FINDINGS: Stable cardiomediastinal silhouette. No pneumothorax or pleural effusion is noted. Stable bilateral lung opacities are noted, right greater than left, concerning for edema or possibly pneumonia. Bony thorax is unremarkable. IMPRESSION: Stable bilateral lung opacities, right greater than left, as described above. Electronically Signed   By: Marijo Conception M.D.   On: 10/03/2018 07:53   Dg Chest Port 1 View  Result Date: 10/02/2018 CLINICAL DATA:  Edema. EXAM: PORTABLE CHEST 1 VIEW COMPARISON:  One-view chest x-ray 10/01/2018 FINDINGS: The heart size is normal. Diffuse interstitial and airspace disease is improved. There is improved aeration at both lung bases. IMPRESSION: 1. Improving aeration of both lungs with residual diffuse interstitial and airspace disease likely representing edema or infection. Electronically Signed   By: San Morelle M.D.   On: 10/02/2018 07:24   Dg Chest Port 1 View  Result Date: 10/01/2018 CLINICAL DATA:  85 year old male with hypoxemia, altered mental status EXAM: PORTABLE CHEST 1 VIEW  COMPARISON:  Prior chest x-ray 09/29/2018 FINDINGS: Interval development of extensive asymmetric interstitial airspace opacities throughout the entirety of the right lung. This is superimposed on a background of mild cardiomegaly and pulmonary vascular congestion. No pneumothorax or large pleural effusion visualized. No acute osseous abnormality. IMPRESSION: Interval development of extensive asymmetric interstitial airspace opacities throughout the right lung compared to 09/29/2018. There is a background of cardiomegaly and vascular congestion. Differential considerations include asymmetric pulmonary edema versus atypical or viral pneumonia. Electronically Signed   By: Jacqulynn Cadet M.D.   On: 10/01/2018 09:06   Dg Chest Port 1 View  Result Date: 09/17/2018 CLINICAL DATA:  CHF EXAM: PORTABLE CHEST 1 VIEW COMPARISON:  09/15/2018, 05/20/2015 FINDINGS: Bilateral mild interstitial thickening. Lingular airspace disease new compared with the prior exam. No pleural effusion or pneumothorax. Stable cardiomediastinal silhouette. No aggressive osseous lesion. IMPRESSION: Stable bilateral mild interstitial thickening likely reflecting chronic interstitial lung disease. An element of superimposed mild interstitial edema is not excluded. New area of airspace disease in the lingula concerning for atelectasis versus pneumonia. Electronically Signed   By: Kathreen Devoid   On: 09/17/2018 09:29   Dg Chest Port 1 View  Result Date: 09/15/2018 CLINICAL DATA:  Shortness of breath EXAM: PORTABLE CHEST 1 VIEW COMPARISON:  08/27/2018, 07/31/2018 07/19/2018, 02/01/2018, CT 02/02/2018 FINDINGS: Emphysematous disease. Diffuse bilateral interstitial opacity some of which  is felt secondary to chronic change. Overall interstitial opacities appear increased as compared with prior, suspect for acute superimposed interstitial edema or inflammatory process. Stable cardiomediastinal silhouette. No pneumothorax. Possible tiny right effusion.  IMPRESSION: 1. Diffusely increased bilateral interstitial opacity, suspicious for acute interstitial edema or inflammatory process superimposed on underlying chronic interstitial disease. Possible tiny right effusion 2. Emphysematous disease Electronically Signed   By: Jasmine Pang M.D.   On: 09/15/2018 15:05   US Abdomen Limited Ruq  Result Date: 09/29/2018 CLINICAL DATA:  Abdominal tenderness today. EXAM: ULTRASOUND ABDOMEN LIMITED RIGHT UPPER QUADRANT COMPARISON:  None. FINDINGS: Gallbladder: No gallstones or wall thickening visualized. Minimal pericholecystic fluid is identified. Ultrasound technologist could not assess sonographic Murphy sign because the patient was unresponsive. Common bile duct: Diameter: 4.5 mm Liver: There is a 1.3 x 1.2 x 1.1 cm cyst in the left lobe liver. Within normal limits in parenchymal echogenicity. Portal vein is patent on color Doppler imaging with normal direction of blood flow towards the liver. IMPRESSION: No gallstones or gallbladder wall thickening. Minimal pericholecystic fluid is noted. Ultrasound technologist could not cyst sonographic Murphy sign because the patient was unresponsive. Liver cyst. Electronically Signed   By: Sherian Rein M.D.   On: 09/29/2018 19:01      Subjective: Family has elected to hospice ON. Patient slowly declining.   Discharge Exam: Vitals:   10/04/18 1000 10/04/18 1100  BP: (!) 128/108 (!) 152/106  Pulse: (!) 145 (!) 132  Resp: (!) 26 (!) 24  Temp:    SpO2: 93% 93%   Vitals:   10/04/18 0800 10/04/18 0900 10/04/18 1000 10/04/18 1100  BP: (!) 146/85 (!) 145/58 (!) 128/108 (!) 152/106  Pulse: (!) 125 (!) 136 (!) 145 (!) 132  Resp: 15 (!) 27 (!) 26 (!) 24  Temp:      TempSrc:      SpO2: 95% 93% 93% 93%  Weight:      Height:       General:85 y.o.ill appearing maleresting in bed in NAD Cardiovascular:tachy, +S1, S2, no m/g/r, equal pulses throughout Respiratory:decreased at bases, rhonchi b/l anteriorly GI: BS+,  NDNT, no masses noted, no organomegaly noted MSK: No e/c/c Skin: No rashes, bruises, ulcerations noted Neuro: delirius, not following commands    The results of significant diagnostics from this hospitalization (including imaging, microbiology, ancillary and laboratory) are listed below for reference.     Microbiology: Recent Results (from the past 240 hour(s))  Culture, blood (routine x 2)     Status: None   Collection Time: 09/29/18  3:43 PM   Specimen: BLOOD  Result Value Ref Range Status   Specimen Description   Final    BLOOD BLOOD LEFT FOREARM Performed at [facility], [address], [place], [place] [address]    Special Requests   Final    BOTTLES DRAWN AEROBIC AND ANAEROBIC Blood Culture results may not be optimal due to an inadequate volume of blood received in culture bottles Performed at [facility], [address] [place], [place] [address]    Culture   Final    NO GROWTH 5 DAYS Performed at [facility], [address], [place], [place] [address]    Report Status 10/04/2018 FINAL  Final  Culture, blood (routine x 2)     Status: None   Collection Time: 09/29/18  3:43 PM   Specimen: BLOOD  Result Value Ref Range Status   Specimen Description   Final  BLOOD BLOOD RIGHT FOREARM Performed at [facility], [address], [place], [place] [address]    Special Requests   Final    BOTTLES DRAWN AEROBIC AND ANAEROBIC Blood Culture adequate volume Performed at [facility], [address], [place], [place] [address]    Culture   Final    NO GROWTH 5 DAYS Performed at [facility], [address], [place], [place] [address]    Report Status 10/04/2018 FINAL  Final  SARS Coronavirus 2 (CEPHEID- Performed in [facility] hospital lab), Hosp Order     Status: None   Collection Time: 09/29/18  3:44 PM   Specimen: Nasopharyngeal Swab  Result Value Ref Range Status   SARS  Coronavirus 2 NEGATIVE NEGATIVE Final    Comment: (NOTE) If result is NEGATIVE SARS-CoV-2 target nucleic acids are NOT DETECTED. The SARS-CoV-2 RNA is generally detectable in upper and lower  respiratory specimens during the acute phase of infection. The lowest  concentration of SARS-CoV-2 viral copies this assay can detect is 250  copies / mL. A negative result does not preclude SARS-CoV-2 infection  and should not be used as the sole basis for treatment or other  patient management decisions.  A negative result may occur with  improper specimen collection / handling, submission of specimen other  than nasopharyngeal swab, presence of viral mutation(s) within the  areas targeted by this assay, and inadequate number of viral copies  (<250 copies / mL). A negative result must be combined with clinical  observations, patient history, and epidemiological information. If result is POSITIVE SARS-CoV-2 target nucleic acids are DETECTED. The SARS-CoV-2 RNA is generally detectable in upper and lower  respiratory specimens dur ing the acute phase of infection.  Positive  results are indicative of active infection with SARS-CoV-2.  Clinical  correlation with patient history and other diagnostic information is  necessary to determine patient infection status.  Positive results do  not rule out bacterial infection or co-infection with other viruses. If result is PRESUMPTIVE POSTIVE SARS-CoV-2 nucleic acids MAY BE PRESENT.   A presumptive positive result was obtained on the submitted specimen  and confirmed on repeat testing.  While 2019 novel coronavirus  (SARS-CoV-2) nucleic acids may be present in the submitted sample  additional confirmatory testing may be necessary for epidemiological  and / or clinical management purposes  to differentiate between  SARS-CoV-2 and other Sarbecovirus currently known to infect humans.  If clinically indicated additional testing with an alternate test   methodology [phone]) is advised. The SARS-CoV-2 RNA is generally  detectable in upper and lower respiratory sp ecimens during the acute  phase of infection. The expected result is Negative. Fact Sheet for Patients:  BoilerBrush.com.cy Fact Sheet for Healthcare Providers: https://pope.com/ This test is not yet approved or cleared by the [place] FDA and has been authorized for detection and/or diagnosis of SARS-CoV-2 by FDA under an Emergency Use Authorization (EUA).  This EUA will remain in effect (meaning this test can be used) for the duration of the COVID-19 declaration under Section 564(b)(1) of the Act, 21 U.S.C. section 360bbb-3(b)(1), unless the authorization is terminated or revoked sooner. Performed at [facility], [address], [place], [place] [address]   MRSA PCR Screening     Status: None   Collection Time: 09/30/18 12:22 AM   Specimen: Nasal Mucosa; Nasopharyngeal  Result Value Ref Range Status   MRSA by PCR NEGATIVE NEGATIVE Final    Comment:  The GeneXpert MRSA Assay (FDA approved for NASAL specimens only), is one component of a comprehensive MRSA colonization surveillance program. It is not intended to diagnose MRSA infection nor to guide or monitor treatment for MRSA infections. Performed at [facility], [address] [place], [place] [id number]      Labs: BNP (last 3 results) Recent Labs    08/27/18 1206 09/15/18 1439 10/01/18 0810  BNP 606.5* 724.0* 847.7*   Basic Metabolic Panel: Recent Labs  Lab 09/29/18 1543 09/30/18 0211 09/30/18 0618 09/30/18 0742 10/01/18 0209 10/01/18 0810 10/02/18 0237 10/03/18 0218  NA 136  --  138  --  140  --  140 141  K 4.0  --  4.3  --  4.0  --  3.5 3.4*  CL 101  --  108  --  111  --  111 109  CO2 22  --  20*  --  21*  --  19* 18*  GLUCOSE 139*  --  100*  --  124*  --  113* 120*  BUN 14  --  16  --  22   --  28* 33*  CREATININE 0.83 0.92 0.92  --  1.07  --  1.17 0.99  CALCIUM 9.2  --  8.4*  --  7.7*  --  7.7* 7.8*  MG  --   --   --  1.3* 1.4*  --  2.3 2.2  PHOS  --   --   --   --   --  2.5  --  1.5*   Liver Function Tests: Recent Labs  Lab 09/29/18 1543 09/30/18 0618 10/02/18 0237 10/03/18 0218  AST 23 31 39  --   ALT 21 19 24   --   ALKPHOS 60 41 43  --   BILITOT 0.8 0.8 1.4*  --   PROT 7.0 5.6* 5.5*  --   ALBUMIN 4.2 3.3* 2.8* 2.9*   Recent Labs  Lab 09/29/18 1543  LIPASE 32   No results for input(s): AMMONIA in the last 168 hours. CBC: Recent Labs  Lab 09/29/18 1543 09/30/18 0211 09/30/18 0742 10/01/18 0209 10/02/18 0237 10/03/18 0218  WBC 22.9* 29.1* 25.6* 21.0* 13.6* 10.3  NEUTROABS 21.0*  --   --   --   --  8.8*  HGB 13.0 11.1* 11.1* 9.8* 10.2* 10.9*  HCT 41.8 35.1* 34.7* 31.3* 33.1* 35.9*  MCV 89.1 89.5 91.1 92.3 92.7 92.8  PLT 184 163 163 129* 128* 141*   Cardiac Enzymes: No results for input(s): CKTOTAL, CKMB, CKMBINDEX, TROPONINI in the last 168 hours. BNP: Invalid input(s): POCBNP CBG: Recent Labs  Lab 10/03/18 1157 10/03/18 1541 10/03/18 2356 10/04/18 0744 10/04/18 1133  GLUCAP 122* 137* 114* 133* 142*   D-Dimer No results for input(s): DDIMER in the last 72 hours. Hgb A1c No results for input(s): HGBA1C in the last 72 hours. Lipid Profile No results for input(s): CHOL, HDL, LDLCALC, TRIG, CHOLHDL, LDLDIRECT in the last 72 hours. Thyroid function studies No results for input(s): TSH, T4TOTAL, T3FREE, THYROIDAB in the last 72 hours.  Invalid input(s): FREET3 Anemia work up No results for input(s): VITAMINB12, FOLATE, FERRITIN, TIBC, IRON, RETICCTPCT in the last 72 hours. Urinalysis    Component Value Date/Time   COLORURINE YELLOW 09/29/2018 1543   APPEARANCEUR CLEAR 09/29/2018 1543   LABSPEC 1.015 09/29/2018 1543   PHURINE 7.0 09/29/2018 1543   GLUCOSEU 50 (A) 09/29/2018 1543   HGBUR NEGATIVE 09/29/2018 1543   BILIRUBINUR NEGATIVE  09/29/2018  1543   KETONESUR NEGATIVE 09/29/2018 1543   PROTEINUR 100 (A) 09/29/2018 1543   UROBILINOGEN 1.0 10/28/2014 1628   NITRITE NEGATIVE 09/29/2018 1543   LEUKOCYTESUR NEGATIVE 09/29/2018 1543   Sepsis Labs Invalid input(s): PROCALCITONIN,  WBC,  LACTICIDVEN Microbiology Recent Results (from the past 240 hour(s))  Culture, blood (routine x 2)     Status: None   Collection Time: 09/29/18  3:43 PM   Specimen: BLOOD  Result Value Ref Range Status   Specimen Description   Final    BLOOD BLOOD LEFT FOREARM Performed at [facility], [address] [place], [place] [address]    Special Requests   Final    BOTTLES DRAWN AEROBIC AND ANAEROBIC Blood Culture results may not be optimal due to an inadequate volume of blood received in culture bottles Performed at [facility], [address] [place], [place] [address]    Culture   Final    NO GROWTH 5 DAYS Performed at [facility], [address] [place], [place] [address]    Report Status 10/04/2018 FINAL  Final  Culture, blood (routine x 2)     Status: None   Collection Time: 09/29/18  3:43 PM   Specimen: BLOOD  Result Value Ref Range Status   Specimen Description   Final    BLOOD BLOOD RIGHT FOREARM Performed at [facility], [address] [place], [place] [address]    Special Requests   Final    BOTTLES DRAWN AEROBIC AND ANAEROBIC Blood Culture adequate volume Performed at [facility], [address] [place], [place] [address]    Culture   Final    NO GROWTH 5 DAYS Performed at [facility], [address] [place], [place] [address]    Report Status 10/04/2018 FINAL  Final  SARS Coronavirus 2 (CEPHEID- Performed in [facility] hospital lab), Hosp Order     Status: None   Collection Time: 09/29/18  3:44 PM   Specimen: Nasopharyngeal Swab  Result Value Ref Range Status   SARS Coronavirus 2 NEGATIVE NEGATIVE Final    Comment:  (NOTE) If result is NEGATIVE SARS-CoV-2 target nucleic acids are NOT DETECTED. The SARS-CoV-2 RNA is generally detectable in upper and lower  respiratory specimens during the acute phase of infection. The lowest  concentration of SARS-CoV-2 viral copies this assay can detect is 250  copies / mL. A negative result does not preclude SARS-CoV-2 infection  and should not be used as the sole basis for treatment or other  patient management decisions.  A negative result may occur with  improper specimen collection / handling, submission of specimen other  than nasopharyngeal swab, presence of viral mutation(s) within the  areas targeted by this assay, and inadequate number of viral copies  (<250 copies / mL). A negative result must be combined with clinical  observations, patient history, and epidemiological information. If result is POSITIVE SARS-CoV-2 target nucleic acids are DETECTED. The SARS-CoV-2 RNA is generally detectable in upper and lower  respiratory specimens dur ing the acute phase of infection.  Positive  results are indicative of active infection with SARS-CoV-2.  Clinical  correlation with patient history and other diagnostic information is  necessary to determine patient infection status.  Positive results do  not rule out bacterial infection or co-infection with other viruses. If result is PRESUMPTIVE POSTIVE SARS-CoV-2 nucleic acids MAY BE PRESENT.   A presumptive positive result was obtained on the submitted specimen  and confirmed on repeat testing.  While 2019 novel coronavirus  (SARS-CoV-2) nucleic acids may be present in the submitted sample  additional confirmatory testing may be necessary for epidemiological  and / or clinical management purposes  to differentiate between  SARS-CoV-2 and other Sarbecovirus currently known to infect humans.  If clinically indicated additional testing with an alternate test  methodology [phone]([id number]) is advised. The SARS-CoV-2 RNA is  generally  detectable in upper and lower respiratory sp ecimens during the acute  phase of infection. The expected result is Negative. Fact Sheet for Patients:  BoilerBrush.com.cyhttps://www.fda.gov/media/136312/download Fact Sheet for Healthcare Providers: https://pope.com/https://www.fda.gov/media/136313/download This test is not yet approved or cleared by the [place] FDA and has been authorized for detection and/or diagnosis of SARS-CoV-2 by FDA under an Emergency Use Authorization (EUA).  This EUA will remain in effect (meaning this test can be used) for the duration of the COVID-19 declaration under Section 564(b)(1) of the Act, 21 U.S.C. section 360bbb-3(b)(1), unless the authorization is terminated or revoked sooner. Performed at [facility], [address] [place], [place] [address]   MRSA PCR Screening     Status: None   Collection Time: 09/30/18 12:22 AM   Specimen: Nasal Mucosa; Nasopharyngeal  Result Value Ref Range Status   MRSA by PCR NEGATIVE NEGATIVE Final    Comment:        The GeneXpert MRSA Assay (FDA approved for NASAL specimens only), is one component of a comprehensive MRSA colonization surveillance program. It is not intended to diagnose MRSA infection nor to guide or monitor treatment for MRSA infections. Performed at [facility], [address] [place], [place] [address]      Time coordinating discharge: 45 minutes  SIGNED:   Teddy Spikeyrone A Kinzley Savell, DO  [facility] 10/04/2018, 11:54 AM Pager   If 7PM-7AM, please contact night-coverage [email or web address] Password [email or web address]

## 2018-10-11 ENCOUNTER — Inpatient Hospital Stay: Payer: Medicare Other | Admitting: Family Medicine

## 2018-10-14 ENCOUNTER — Inpatient Hospital Stay: Payer: Medicare Other | Admitting: Family Medicine

## 2018-10-15 ENCOUNTER — Encounter: Payer: Self-pay | Admitting: Family Medicine

## 2018-10-15 ENCOUNTER — Telehealth: Payer: Self-pay | Admitting: Family Medicine

## 2018-10-15 NOTE — Progress Notes (Signed)
Received a copy of death certificate to sign for patient from [employer] [place].  Patient was under hospice care and passed away on  at home around 7:45 pm.  Death certificate completed. Status of deceased changed within demographics. 

## 2018-10-15 NOTE — Telephone Encounter (Signed)
Death certificate completed. Please notify funeral home listed on envelope that document is ready for pick. Please make a copy of scan directly to EMR and keep copy at the front desk in the event an issue arises with the document.

## 2018-10-26 DEATH — deceased

## 2018-11-08 ENCOUNTER — Ambulatory Visit: Payer: Medicare Other | Admitting: Family Medicine

## 2019-12-16 IMAGING — CT CT ANGIO CHEST
2 of 6 series · 18 of 46 positions shown · IV contrast (ISOVUE 370)
Comparison: Chest radiograph, 02/01/2018.

CLINICAL DATA: Patient presented after fevers. Reportedly fevers up
to 102 today. Has had a little bit of a cough. Does have history of
COPD and is oxygen at home as needed. No dysuria. Cough is been
worse over the last week. Has had some more confusion with it.
Somewhat difficult to get history from due to previous stroke.

EXAM:
CT ANGIOGRAPHY CHEST WITH CONTRAST
TECHNIQUE: Multidetector CT imaging of the chest was performed using the
standard protocol during bolus administration of intravenous
contrast. Multiplanar CT image reconstructions and MIPs were
obtained to evaluate the vascular anatomy.
CONTRAST:  100mL [id number] IOPAMIDOL ([id number]) INJECTION 76%

[Series 6: coronal mpr · coronal · 0.62mm/px · 2 of 116 slices shown]
[im 39/116  soft-tissue]
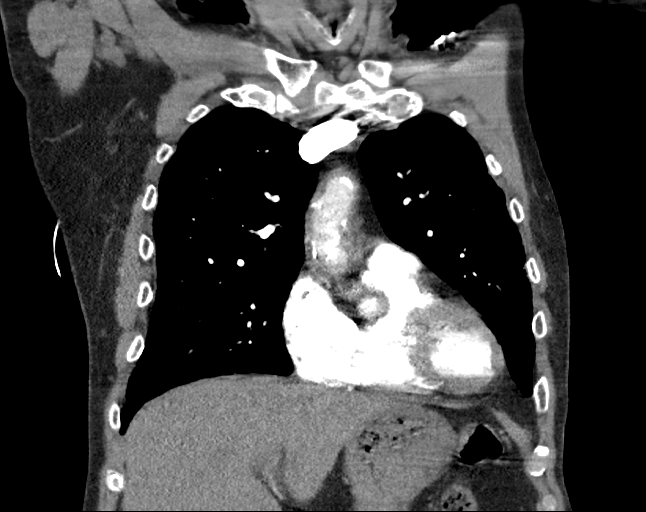
[im 77/116  soft-tissue]
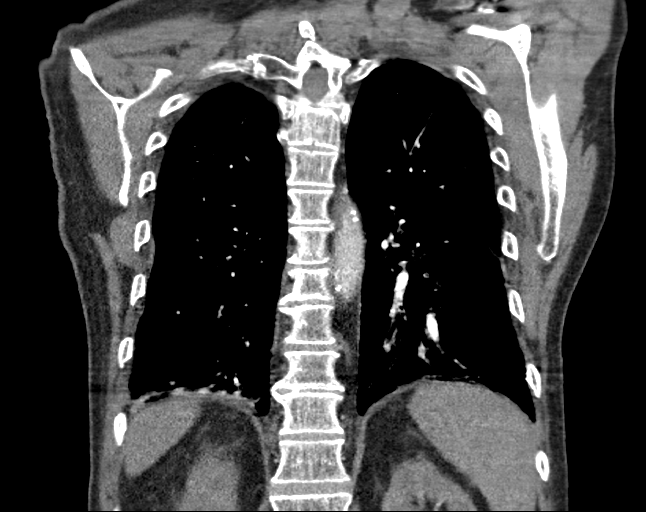

[Series 7: thins · axial · 0.73mm/px · z∈[-337,-48]mm · 16 of 317 slices shown]
[im 14/317  lung]
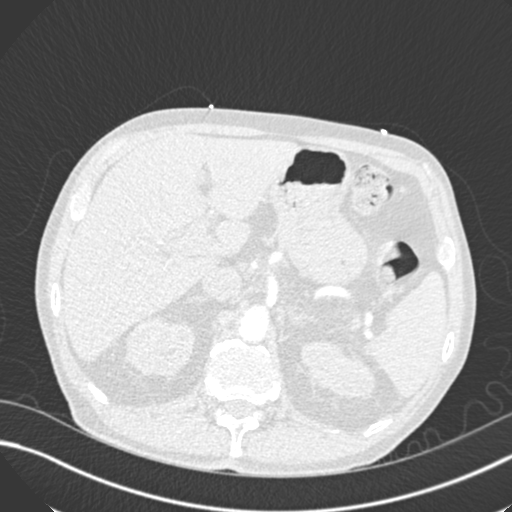
[im 42/317  soft-tissue]
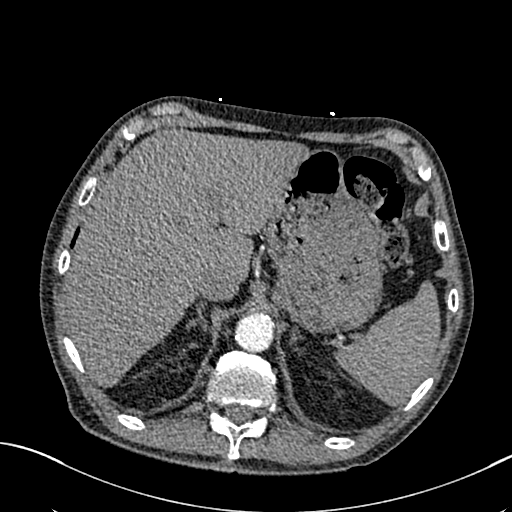
[im 55/317  lung]
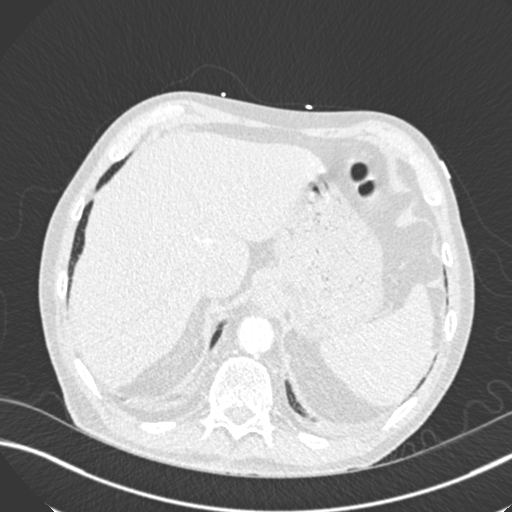
[im 69/317  soft-tissue]
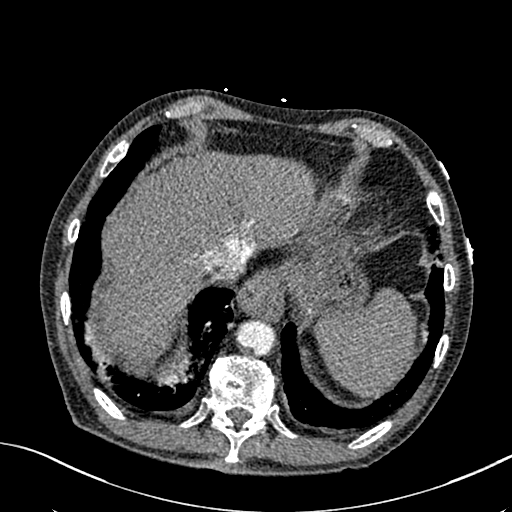
[im 97/317  lung]
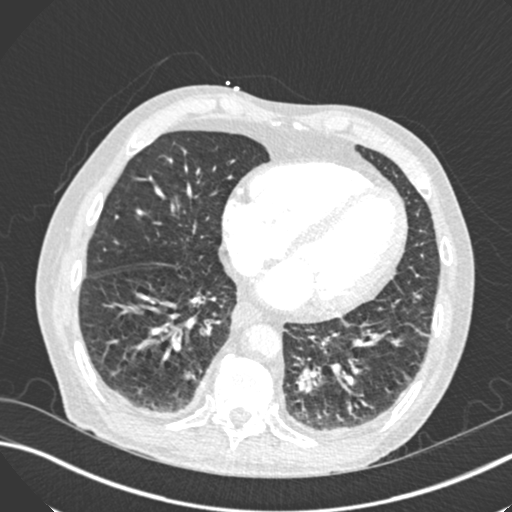
[im 110/317  soft-tissue]
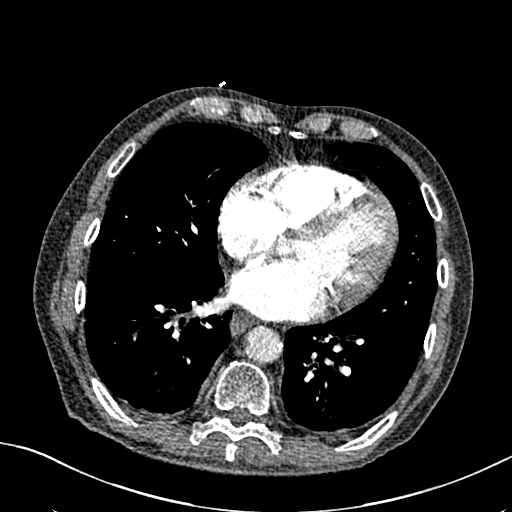
[im 124/317  lung]
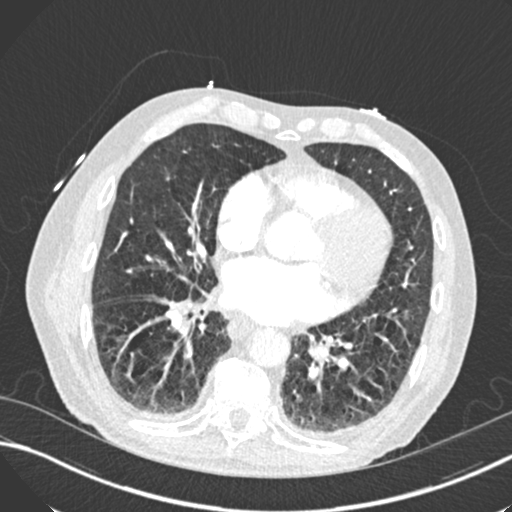
[im 152/317  soft-tissue]
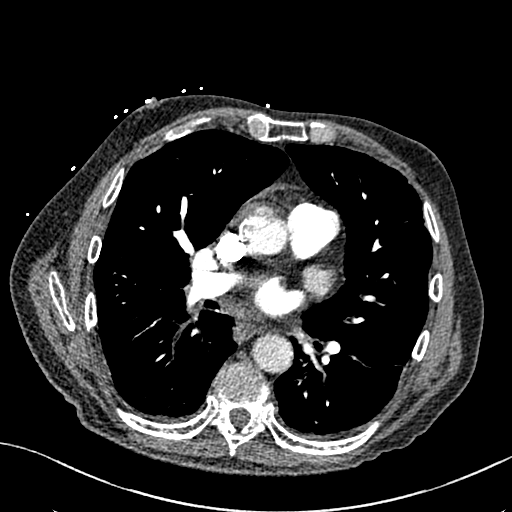
[im 165/317  lung]
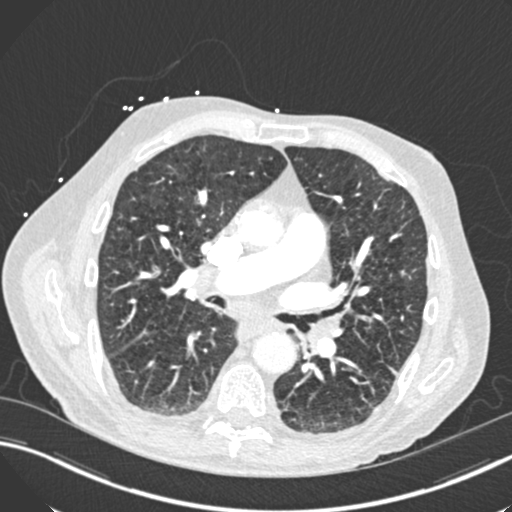
[im 193/317  soft-tissue]
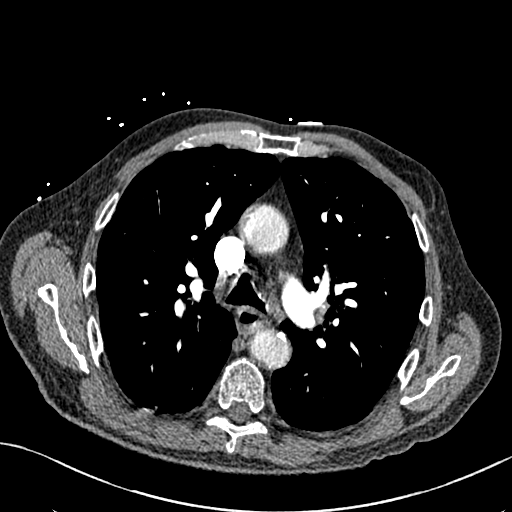
[im 207/317  lung]
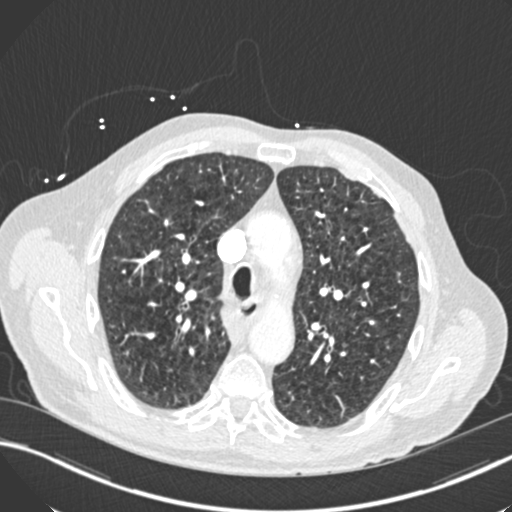
[im 220/317  soft-tissue]
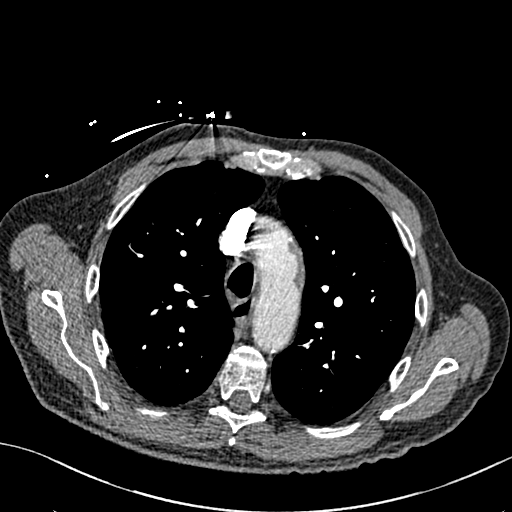
[im 248/317  lung]
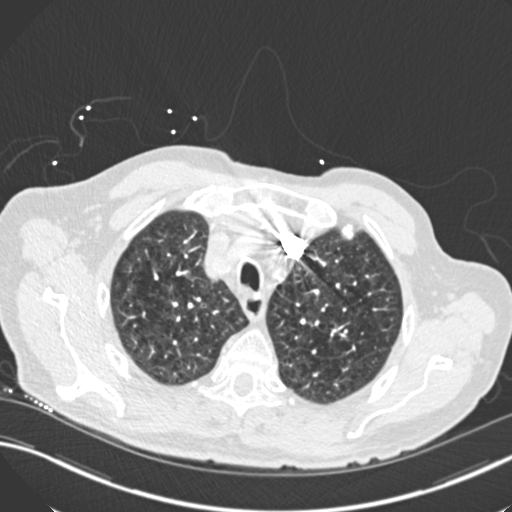
[im 262/317  soft-tissue]
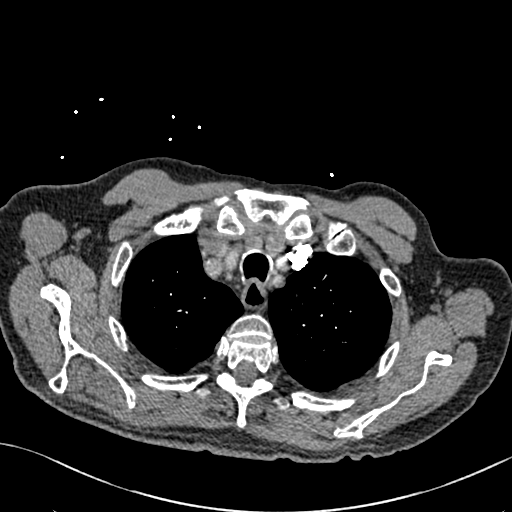
[im 275/317  lung]
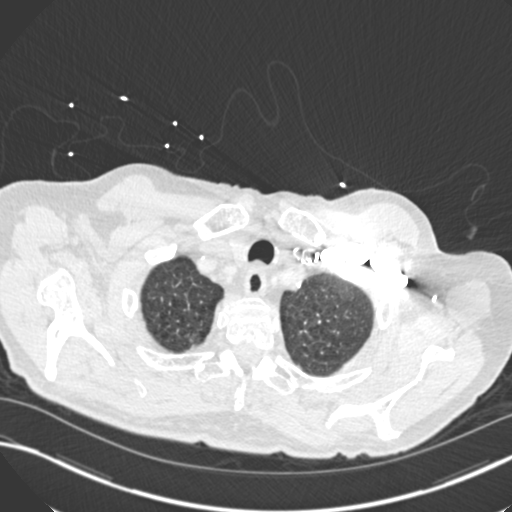
[im 303/317  soft-tissue]
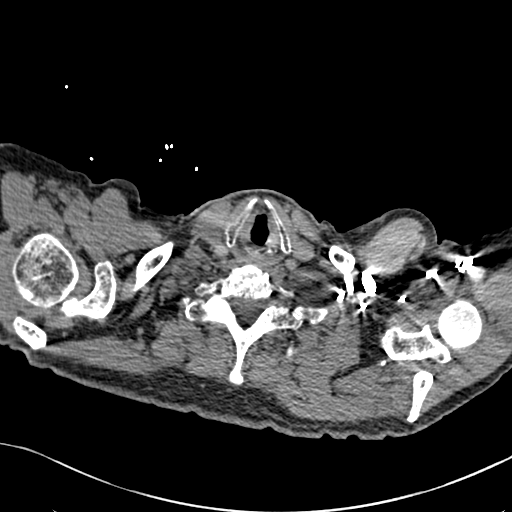

[18 of 46 positions shown; findings below may reference images not displayed]

FINDINGS: Cardiovascular: There is satisfactory opacification of the pulmonary
arteries to the segmental level. There is no evidence of a pulmonary
embolism. Heart is top-normal in size. No pericardial effusion.
Three-vessel coronary artery calcifications. Great vessels normal
caliber. There is aortic atherosclerosis. No dissection or aneurysm.

Mediastinum/Nodes: No neck base, axillary, mediastinal or hilar
masses or enlarged lymph nodes. Trachea is widely patent. Esophagus
unremarkable. Small hiatal hernia.

Lungs/Pleura: Moderate centrilobular emphysema. There is bronchial
wall thickening with lower lobe patchy dependent and
peribronchovascular opacity. Remainder of the lungs is clear. No
mass or suspicious nodule. Small partly calcified pleural plaques
are noted along the anterior mid to upper hemithorax.

No pleural effusion.  No pneumothorax.

Upper Abdomen: No acute abnormality.

Musculoskeletal: No fracture or acute finding. No osteoblastic or
osteolytic lesions.

Review of the MIP images confirms the above findings.
IMPRESSION: 1. No evidence of a pulmonary embolism.
2. Bilateral lower lobe bronchial wall thickening with patchy
peribronchovascular opacities. Eyes consistent with bronchitis and
probable bronchopneumonia.
3. No other acute abnormality.
4. Moderate centrilobular emphysema.
5. Aortic atherosclerosis.  Coronary artery calcifications.

Aortic Atherosclerosis (9MQ76-NX4.4) and Emphysema ([id number].J).

## 2020-02-09 IMAGING — DX DG CHEST 2V
2 series · 2 of 2 positions shown · non-contrast
Comparison: 02/15/2018 and earlier.

CLINICAL DATA: 89-year-old male with left chest pain for a few
days.

EXAM:
CHEST - 2 VIEW

[chest pa]
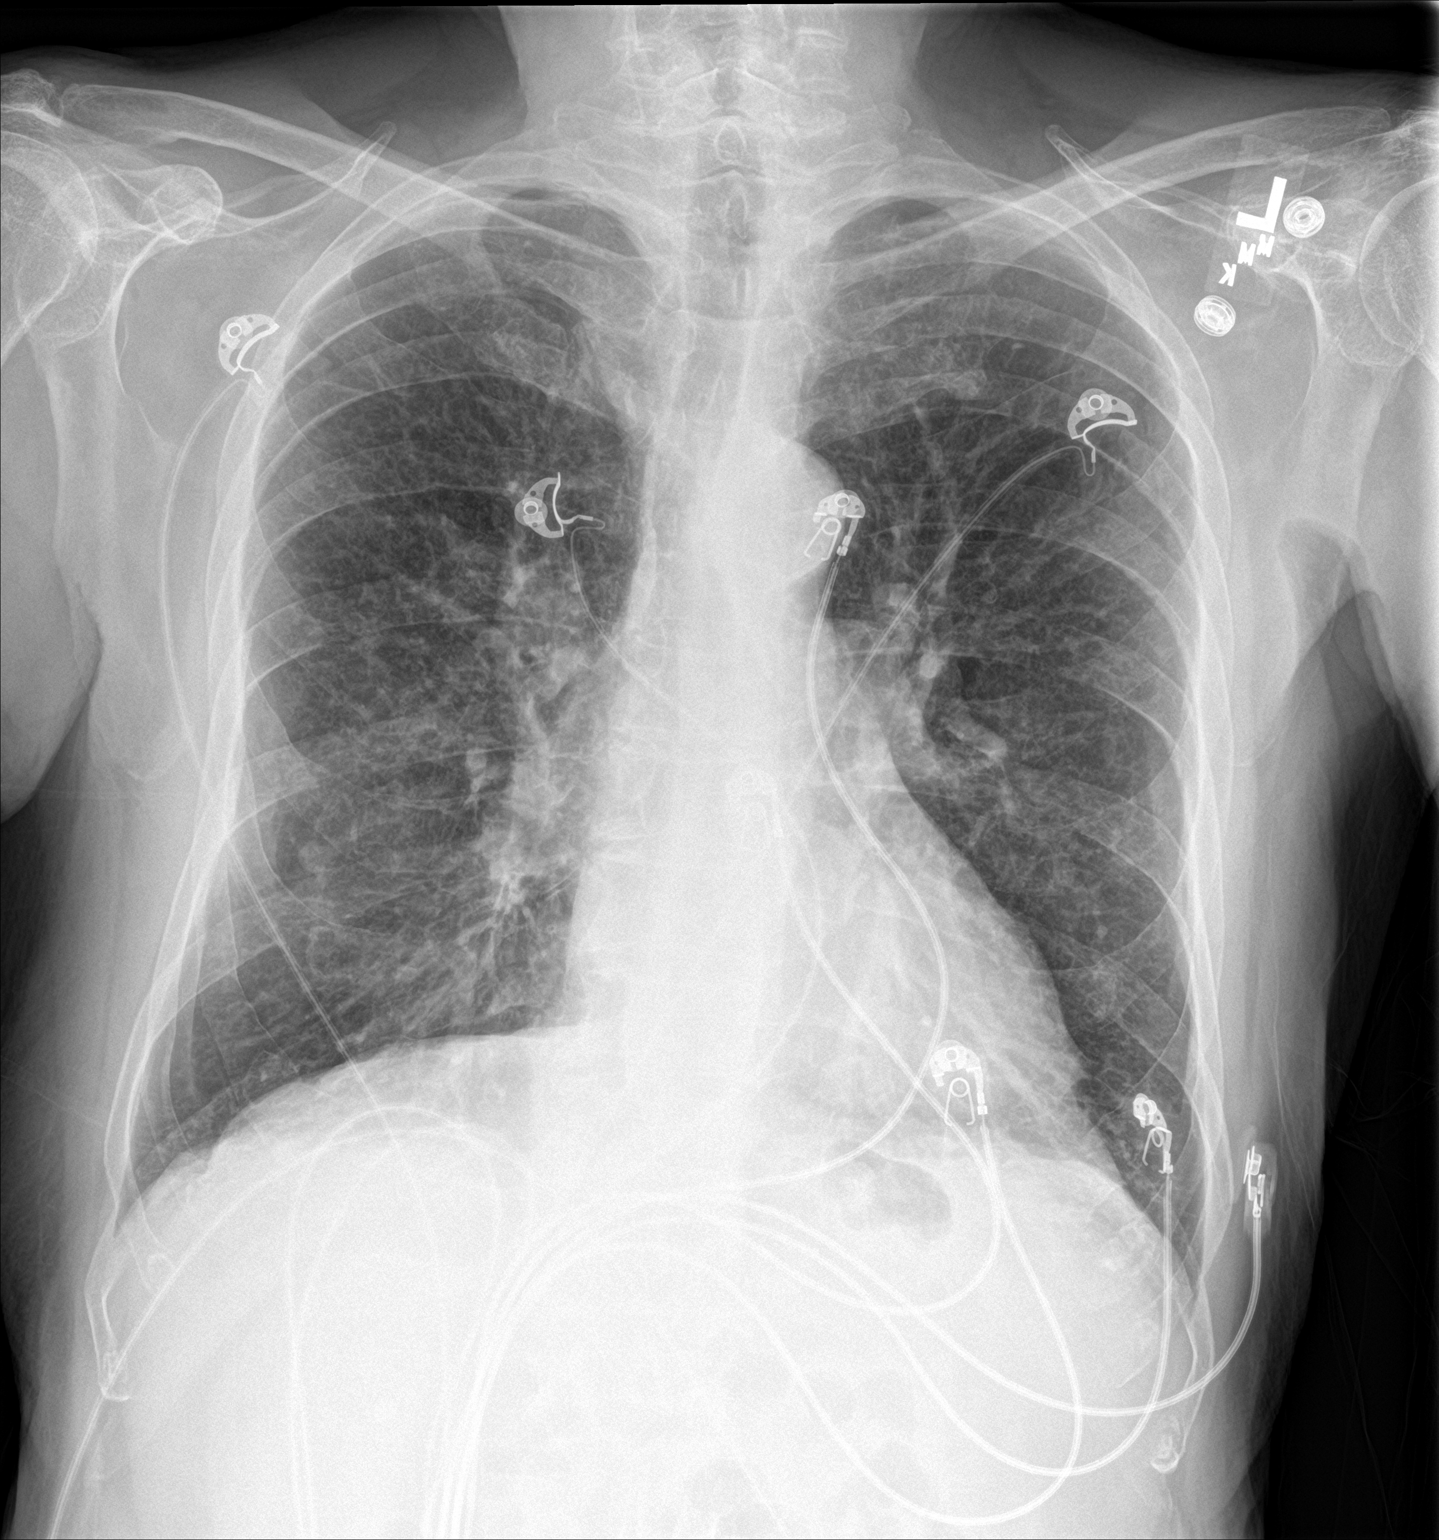

[chest lat]
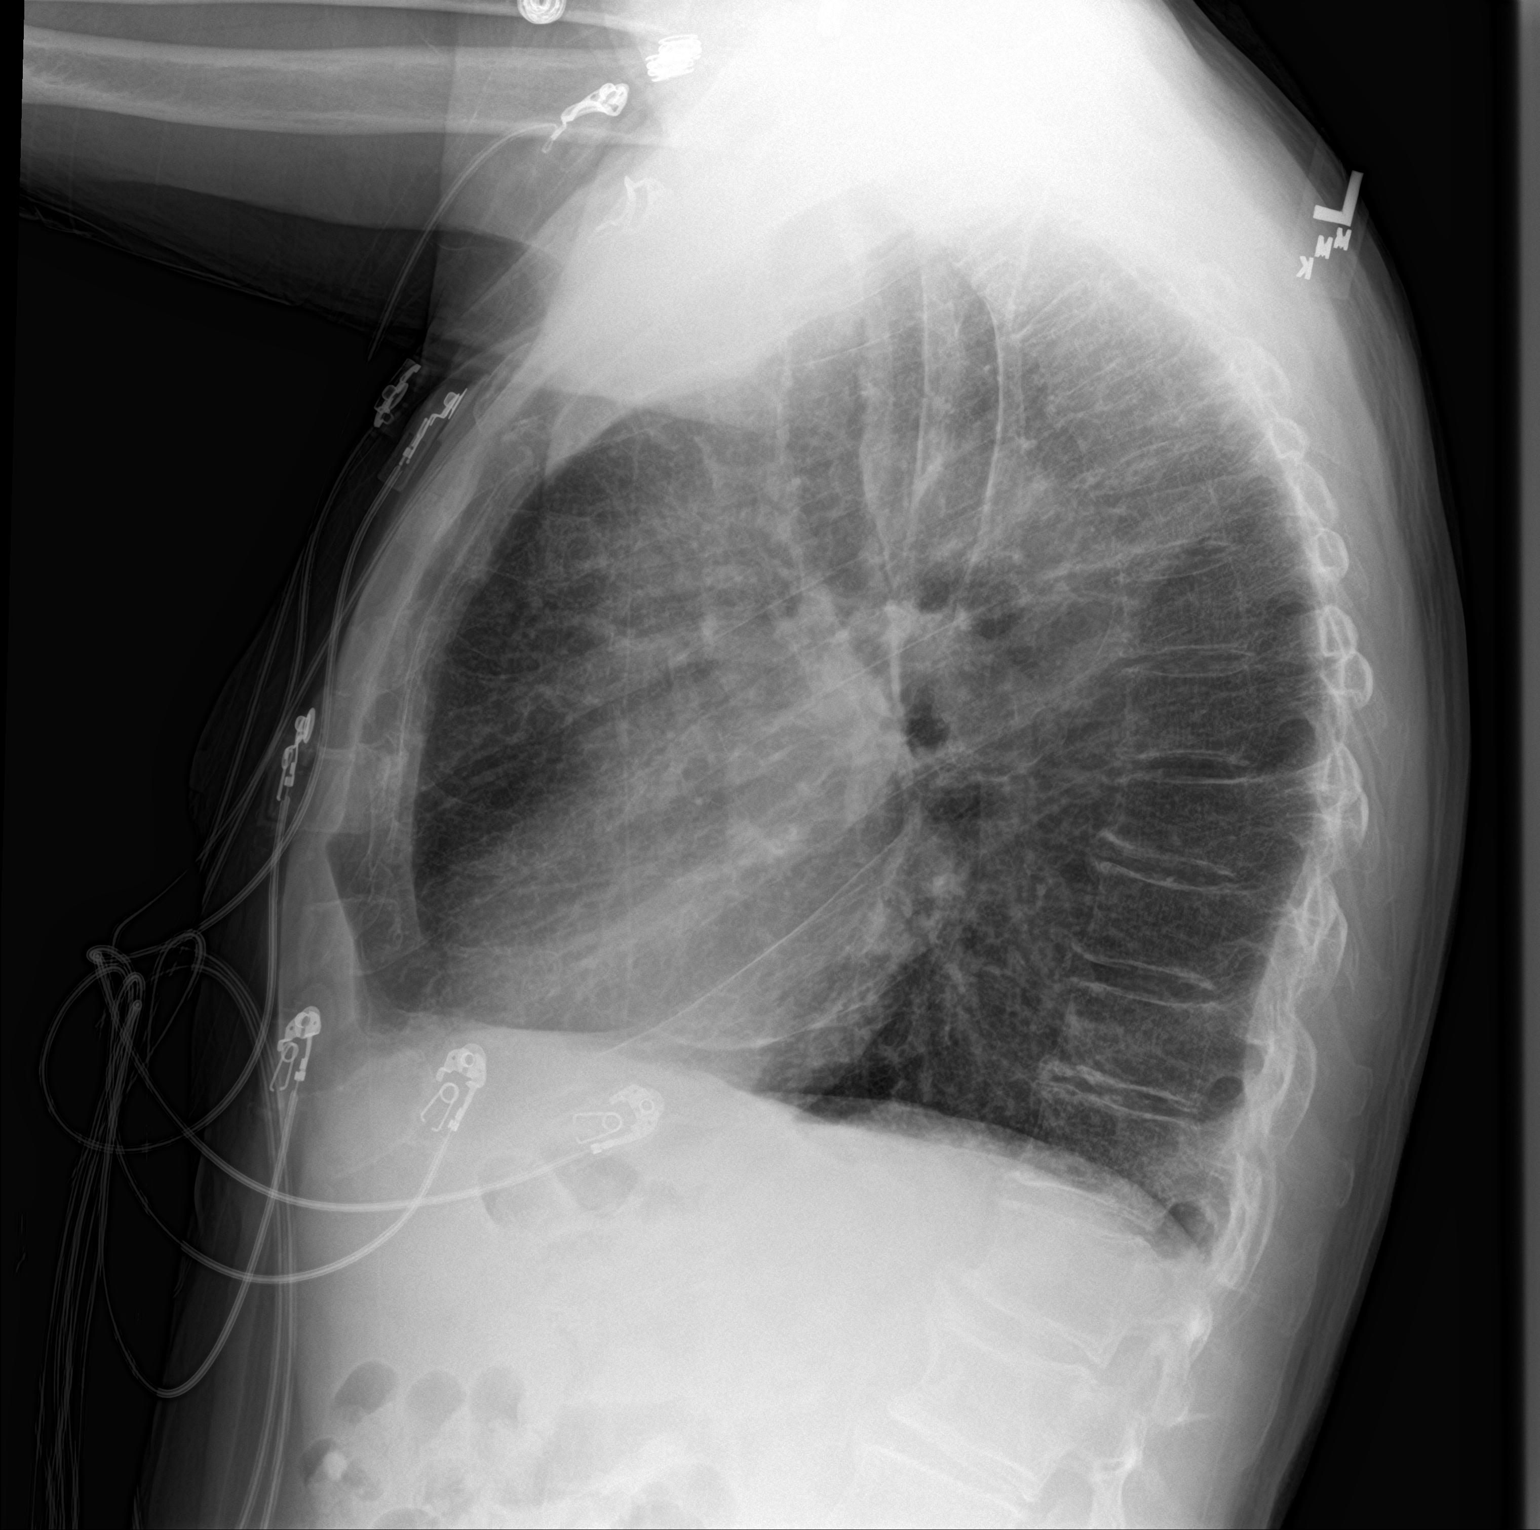

[2 of 2 positions shown; findings below may reference images not displayed]

FINDINGS: Centrilobular emphysema demonstrated by CT in [REDACTED]. Stable large
lung volumes. Stable mild cardiomegaly. Other mediastinal contours
are within normal limits. Visualized tracheal air column is within
normal limits. Chronic but increased pulmonary interstitial markings
in both lungs. No pneumothorax, pleural effusion or confluent
pulmonary opacity. Osteopenia. No acute osseous abnormality
identified. Negative visible bowel gas pattern.
IMPRESSION: Chronic lung disease with emphysema, and mild cardiomegaly.

Increased bilateral pulmonary interstitial markings compared to
Audrius [place] Sam[place]. Consider acute viral/atypical respiratory infection versus
mild or developing interstitial edema. No pleural effusion.

## 2020-05-22 IMAGING — DX CHEST - 2 VIEW
2 series · 2 of 2 positions shown · non-contrast
Comparison: 06/05/2018

CLINICAL DATA: Altered mental status.  Stroke.

EXAM:
CHEST - 2 VIEW

[chest lat]
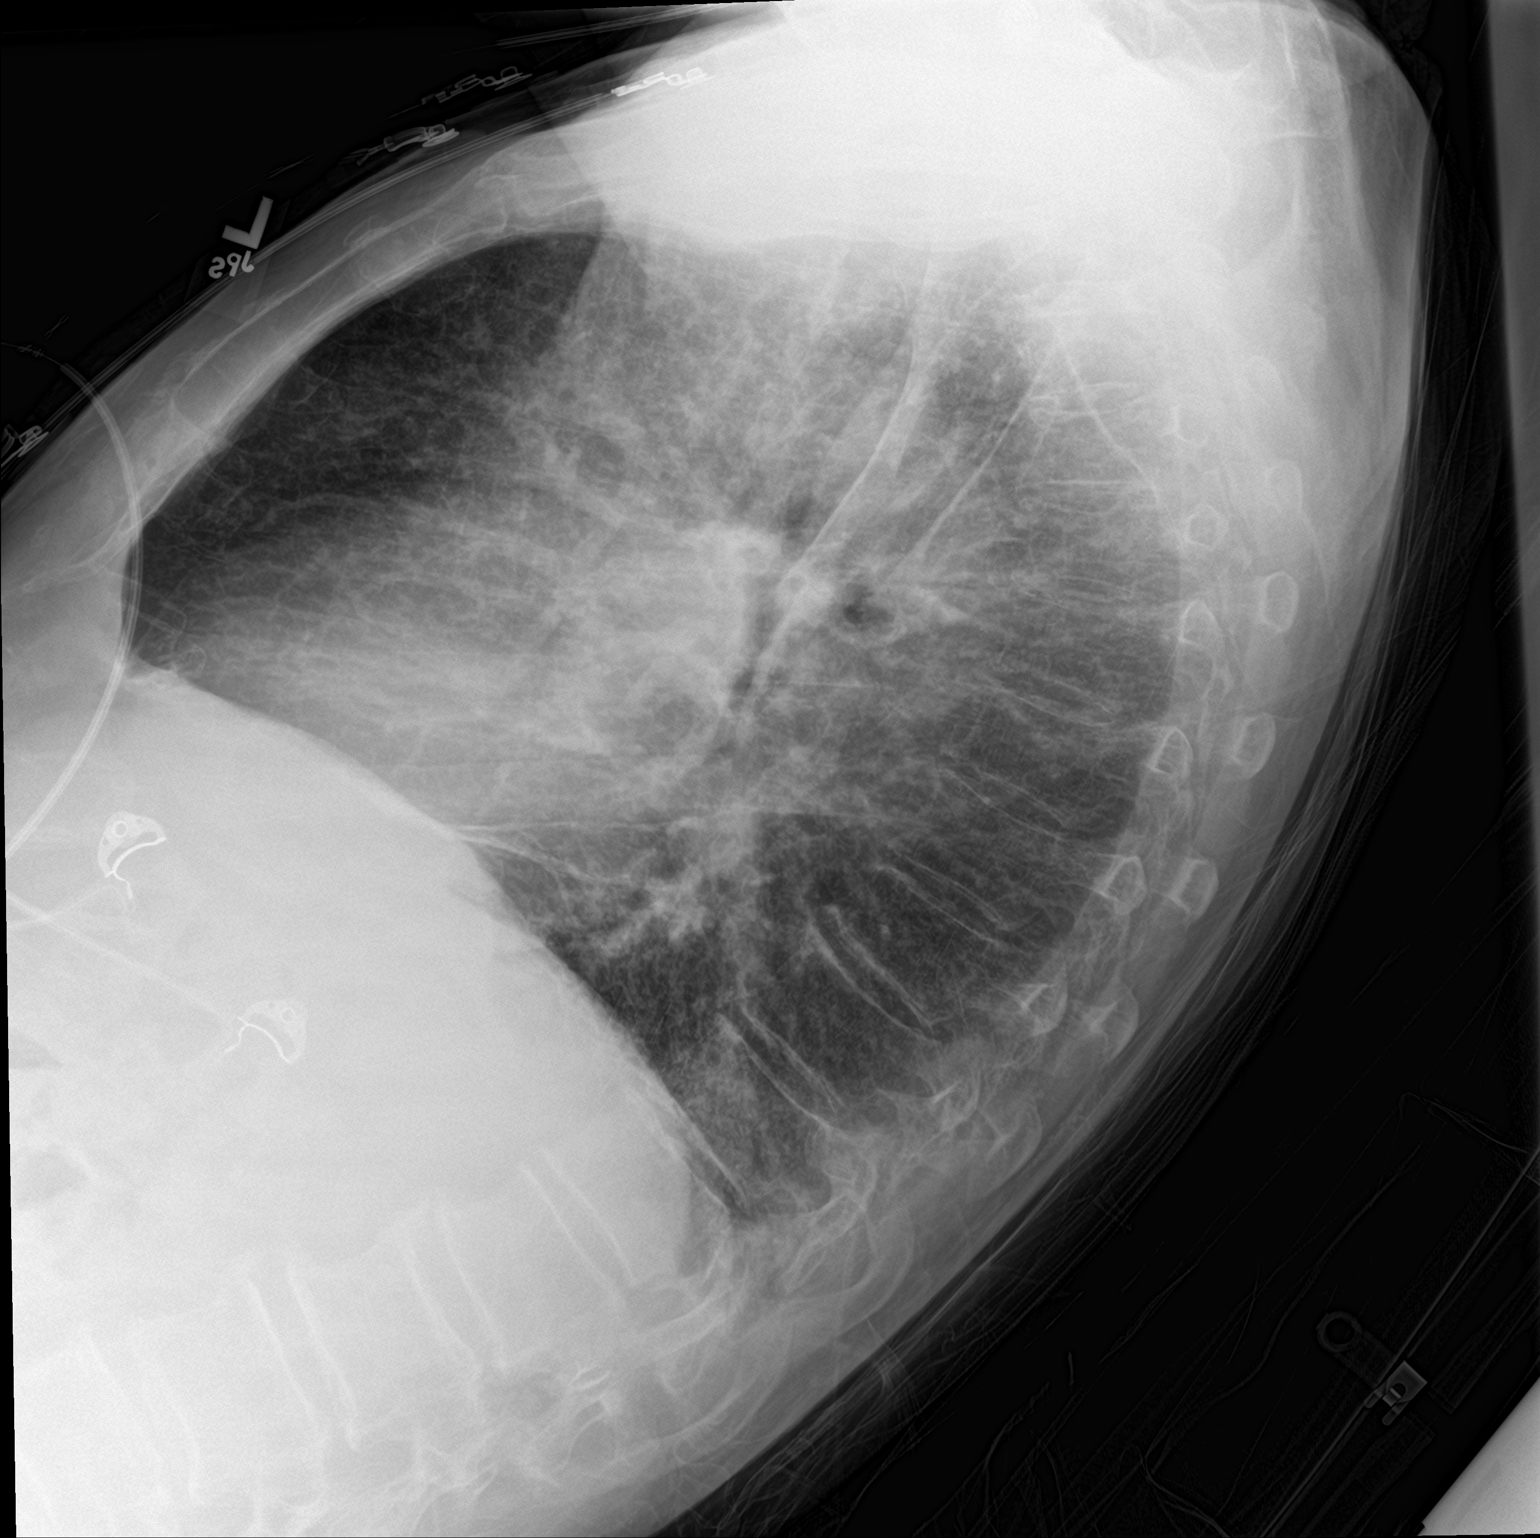

[chest ap]
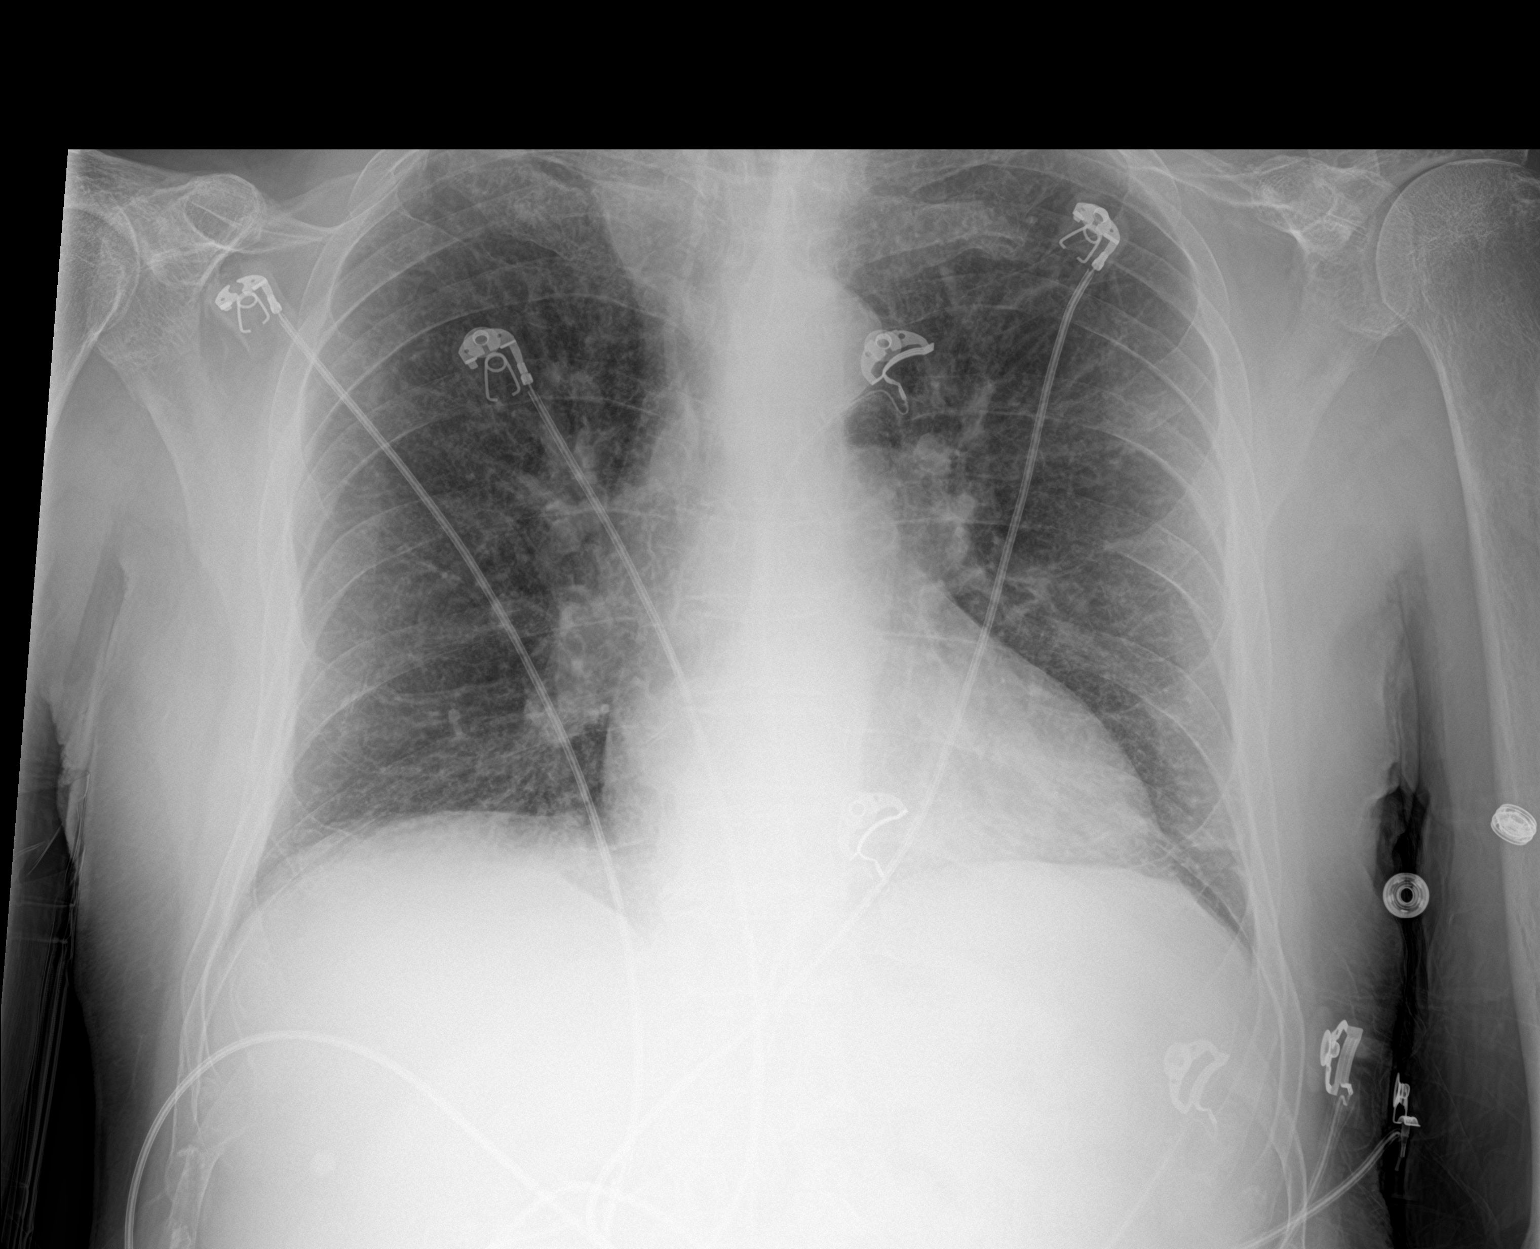

[2 of 2 positions shown; findings below may reference images not displayed]

FINDINGS: Artifact overlies the chest. Heart size is at the upper limits of
normal. There is aortic atherosclerosis. The lungs are clear except
for mild scarring. No infiltrate, collapse or effusion.
IMPRESSION: No active cardiopulmonary disease.
